# Patient Record
Sex: Male | Born: 1940 | Race: White | Hispanic: No | Marital: Married | Smoking: Former smoker
Health system: Southern US, Community
[De-identification: ages and names within clinical notes are randomized; demographics above are authoritative.]

## PROBLEM LIST (undated history)

## (undated) DIAGNOSIS — I255 Ischemic cardiomyopathy: Secondary | ICD-10-CM

## (undated) DIAGNOSIS — K219 Gastro-esophageal reflux disease without esophagitis: Secondary | ICD-10-CM

## (undated) DIAGNOSIS — I469 Cardiac arrest, cause unspecified: Secondary | ICD-10-CM

## (undated) DIAGNOSIS — I714 Abdominal aortic aneurysm, without rupture: Secondary | ICD-10-CM

## (undated) DIAGNOSIS — I739 Peripheral vascular disease, unspecified: Secondary | ICD-10-CM

## (undated) DIAGNOSIS — I471 Supraventricular tachycardia: Secondary | ICD-10-CM

## (undated) DIAGNOSIS — I472 Ventricular tachycardia, unspecified: Secondary | ICD-10-CM

## (undated) DIAGNOSIS — I5022 Chronic systolic (congestive) heart failure: Secondary | ICD-10-CM

## (undated) DIAGNOSIS — I4819 Other persistent atrial fibrillation: Secondary | ICD-10-CM

## (undated) DIAGNOSIS — N62 Hypertrophy of breast: Secondary | ICD-10-CM

## (undated) DIAGNOSIS — D649 Anemia, unspecified: Secondary | ICD-10-CM

## (undated) DIAGNOSIS — I4892 Unspecified atrial flutter: Secondary | ICD-10-CM

## (undated) DIAGNOSIS — I10 Essential (primary) hypertension: Secondary | ICD-10-CM

## (undated) DIAGNOSIS — I2510 Atherosclerotic heart disease of native coronary artery without angina pectoris: Secondary | ICD-10-CM

## (undated) DIAGNOSIS — I253 Aneurysm of heart: Secondary | ICD-10-CM

## (undated) DIAGNOSIS — K4021 Bilateral inguinal hernia, without obstruction or gangrene, recurrent: Secondary | ICD-10-CM

## (undated) DIAGNOSIS — I219 Acute myocardial infarction, unspecified: Secondary | ICD-10-CM

## (undated) DIAGNOSIS — E0590 Thyrotoxicosis, unspecified without thyrotoxic crisis or storm: Secondary | ICD-10-CM

## (undated) DIAGNOSIS — M1990 Unspecified osteoarthritis, unspecified site: Secondary | ICD-10-CM

## (undated) DIAGNOSIS — I447 Left bundle-branch block, unspecified: Secondary | ICD-10-CM

## (undated) DIAGNOSIS — I319 Disease of pericardium, unspecified: Secondary | ICD-10-CM

## (undated) HISTORY — DX: Left bundle-branch block, unspecified: I44.7

## (undated) HISTORY — PX: ENDOVASCULAR STENT INSERTION: SHX5161

## (undated) HISTORY — DX: Ischemic cardiomyopathy: I25.5

## (undated) HISTORY — DX: Chronic systolic (congestive) heart failure: I50.22

## (undated) HISTORY — DX: Ventricular tachycardia, unspecified: I47.20

## (undated) HISTORY — DX: Abdominal aortic aneurysm, without rupture: I71.4

## (undated) HISTORY — PX: TONSILLECTOMY: SUR1361

## (undated) HISTORY — DX: Peripheral vascular disease, unspecified: I73.9

## (undated) HISTORY — PX: VENTRICULAR ABLATION SURGERY: SHX835

## (undated) HISTORY — PX: PACEMAKER INSERTION: SHX728

## (undated) HISTORY — DX: Ventricular tachycardia: I47.2

## (undated) HISTORY — DX: Thyrotoxicosis, unspecified without thyrotoxic crisis or storm: E05.90

## (undated) HISTORY — PX: CARDIAC CATHETERIZATION: SHX172

## (undated) HISTORY — DX: Hypertrophy of breast: N62

## (undated) HISTORY — DX: Acute myocardial infarction, unspecified: I21.9

## (undated) HISTORY — DX: Atherosclerotic heart disease of native coronary artery without angina pectoris: I25.10

---

## 1997-05-31 DIAGNOSIS — I2510 Atherosclerotic heart disease of native coronary artery without angina pectoris: Secondary | ICD-10-CM

## 1997-05-31 HISTORY — DX: Atherosclerotic heart disease of native coronary artery without angina pectoris: I25.10

## 1997-08-29 ENCOUNTER — Encounter (HOSPITAL_COMMUNITY): Admission: RE | Admit: 1997-08-29 | Discharge: 1997-11-27 | Payer: Self-pay | Admitting: Cardiology

## 1997-11-28 ENCOUNTER — Encounter (HOSPITAL_COMMUNITY): Admission: RE | Admit: 1997-11-28 | Discharge: 1998-02-26 | Payer: Self-pay | Admitting: Cardiology

## 1998-02-27 ENCOUNTER — Encounter (HOSPITAL_COMMUNITY): Admission: RE | Admit: 1998-02-27 | Discharge: 1998-05-28 | Payer: Self-pay | Admitting: Cardiology

## 1998-05-29 ENCOUNTER — Encounter (HOSPITAL_COMMUNITY): Admission: RE | Admit: 1998-05-29 | Discharge: 1998-08-27 | Payer: Self-pay | Admitting: Cardiology

## 1998-08-28 ENCOUNTER — Encounter (HOSPITAL_COMMUNITY): Admission: RE | Admit: 1998-08-28 | Discharge: 1998-11-26 | Payer: Self-pay | Admitting: Cardiology

## 1998-11-27 ENCOUNTER — Encounter (HOSPITAL_COMMUNITY): Admission: RE | Admit: 1998-11-27 | Discharge: 1999-02-25 | Payer: Self-pay | Admitting: Cardiology

## 2003-07-09 ENCOUNTER — Inpatient Hospital Stay (HOSPITAL_COMMUNITY): Admission: RE | Admit: 2003-07-09 | Discharge: 2003-07-17 | Payer: Self-pay | Admitting: Cardiology

## 2003-07-10 ENCOUNTER — Encounter: Payer: Self-pay | Admitting: Cardiology

## 2004-01-13 ENCOUNTER — Emergency Department (HOSPITAL_COMMUNITY): Admission: EM | Admit: 2004-01-13 | Discharge: 2004-01-14 | Payer: Self-pay | Admitting: Emergency Medicine

## 2004-06-25 ENCOUNTER — Ambulatory Visit: Payer: Self-pay

## 2004-11-10 ENCOUNTER — Ambulatory Visit: Payer: Self-pay | Admitting: Internal Medicine

## 2005-02-22 ENCOUNTER — Ambulatory Visit: Payer: Self-pay

## 2005-06-18 ENCOUNTER — Ambulatory Visit: Payer: Self-pay | Admitting: Internal Medicine

## 2005-10-21 ENCOUNTER — Ambulatory Visit: Payer: Self-pay | Admitting: Internal Medicine

## 2006-02-08 ENCOUNTER — Ambulatory Visit: Payer: Self-pay | Admitting: Internal Medicine

## 2006-06-13 ENCOUNTER — Inpatient Hospital Stay (HOSPITAL_COMMUNITY): Admission: EM | Admit: 2006-06-13 | Discharge: 2006-06-14 | Payer: Self-pay | Admitting: Emergency Medicine

## 2006-06-13 ENCOUNTER — Ambulatory Visit: Payer: Self-pay | Admitting: Internal Medicine

## 2006-06-23 ENCOUNTER — Ambulatory Visit: Payer: Self-pay | Admitting: Internal Medicine

## 2006-07-13 ENCOUNTER — Ambulatory Visit: Payer: Self-pay | Admitting: Internal Medicine

## 2006-07-13 ENCOUNTER — Ambulatory Visit: Payer: Self-pay

## 2006-08-11 ENCOUNTER — Ambulatory Visit: Payer: Self-pay

## 2006-08-11 ENCOUNTER — Ambulatory Visit: Payer: Self-pay | Admitting: Internal Medicine

## 2006-12-19 ENCOUNTER — Ambulatory Visit: Payer: Self-pay | Admitting: Internal Medicine

## 2007-02-02 ENCOUNTER — Ambulatory Visit: Payer: Self-pay

## 2007-03-28 ENCOUNTER — Ambulatory Visit: Payer: Self-pay | Admitting: Internal Medicine

## 2007-04-06 ENCOUNTER — Ambulatory Visit: Payer: Self-pay | Admitting: *Deleted

## 2007-06-01 DIAGNOSIS — I714 Abdominal aortic aneurysm, without rupture, unspecified: Secondary | ICD-10-CM

## 2007-06-01 HISTORY — DX: Abdominal aortic aneurysm, without rupture: I71.4

## 2007-06-01 HISTORY — DX: Abdominal aortic aneurysm, without rupture, unspecified: I71.40

## 2007-06-15 ENCOUNTER — Ambulatory Visit: Payer: Self-pay

## 2007-09-21 ENCOUNTER — Ambulatory Visit: Payer: Self-pay

## 2007-10-05 ENCOUNTER — Ambulatory Visit: Payer: Self-pay | Admitting: *Deleted

## 2007-10-24 ENCOUNTER — Encounter: Admission: RE | Admit: 2007-10-24 | Discharge: 2007-10-24 | Payer: Self-pay | Admitting: *Deleted

## 2007-10-24 ENCOUNTER — Ambulatory Visit: Payer: Self-pay | Admitting: Internal Medicine

## 2007-10-26 ENCOUNTER — Ambulatory Visit: Payer: Self-pay | Admitting: *Deleted

## 2007-11-24 ENCOUNTER — Inpatient Hospital Stay (HOSPITAL_COMMUNITY): Admission: RE | Admit: 2007-11-24 | Discharge: 2007-11-26 | Payer: Self-pay | Admitting: *Deleted

## 2007-11-24 ENCOUNTER — Ambulatory Visit: Payer: Self-pay | Admitting: *Deleted

## 2007-12-14 ENCOUNTER — Ambulatory Visit: Payer: Self-pay | Admitting: Internal Medicine

## 2007-12-21 ENCOUNTER — Ambulatory Visit: Payer: Self-pay | Admitting: Internal Medicine

## 2007-12-21 ENCOUNTER — Inpatient Hospital Stay (HOSPITAL_COMMUNITY): Admission: EM | Admit: 2007-12-21 | Discharge: 2007-12-24 | Payer: Self-pay | Admitting: Emergency Medicine

## 2007-12-29 ENCOUNTER — Ambulatory Visit: Payer: Self-pay | Admitting: Internal Medicine

## 2008-01-04 ENCOUNTER — Ambulatory Visit: Payer: Self-pay | Admitting: *Deleted

## 2008-01-29 ENCOUNTER — Ambulatory Visit: Payer: Self-pay | Admitting: Internal Medicine

## 2008-02-06 ENCOUNTER — Ambulatory Visit: Payer: Self-pay | Admitting: Internal Medicine

## 2008-02-27 ENCOUNTER — Ambulatory Visit: Payer: Self-pay | Admitting: Internal Medicine

## 2008-03-08 ENCOUNTER — Ambulatory Visit: Payer: Self-pay | Admitting: Internal Medicine

## 2008-05-17 ENCOUNTER — Ambulatory Visit: Payer: Self-pay | Admitting: Internal Medicine

## 2008-07-02 ENCOUNTER — Encounter: Payer: Self-pay | Admitting: Internal Medicine

## 2008-07-04 ENCOUNTER — Ambulatory Visit: Payer: Self-pay | Admitting: *Deleted

## 2008-07-18 ENCOUNTER — Ambulatory Visit: Payer: Self-pay

## 2008-09-12 ENCOUNTER — Ambulatory Visit: Payer: Self-pay

## 2008-09-12 ENCOUNTER — Encounter: Payer: Self-pay | Admitting: Internal Medicine

## 2008-10-09 ENCOUNTER — Encounter (INDEPENDENT_AMBULATORY_CARE_PROVIDER_SITE_OTHER): Payer: Self-pay | Admitting: *Deleted

## 2008-10-17 ENCOUNTER — Encounter: Payer: Self-pay | Admitting: Internal Medicine

## 2008-11-27 ENCOUNTER — Encounter: Payer: Self-pay | Admitting: Internal Medicine

## 2008-12-09 ENCOUNTER — Encounter: Payer: Self-pay | Admitting: Internal Medicine

## 2008-12-09 ENCOUNTER — Ambulatory Visit: Payer: Self-pay

## 2009-01-02 ENCOUNTER — Ambulatory Visit: Payer: Self-pay | Admitting: *Deleted

## 2009-02-07 DIAGNOSIS — I714 Abdominal aortic aneurysm, without rupture, unspecified: Secondary | ICD-10-CM | POA: Insufficient documentation

## 2009-02-07 DIAGNOSIS — E0590 Thyrotoxicosis, unspecified without thyrotoxic crisis or storm: Secondary | ICD-10-CM | POA: Insufficient documentation

## 2009-02-11 ENCOUNTER — Ambulatory Visit: Payer: Self-pay | Admitting: Internal Medicine

## 2009-02-17 ENCOUNTER — Encounter: Payer: Self-pay | Admitting: Internal Medicine

## 2009-04-07 ENCOUNTER — Ambulatory Visit: Payer: Self-pay

## 2009-04-07 ENCOUNTER — Encounter: Payer: Self-pay | Admitting: Internal Medicine

## 2009-04-18 ENCOUNTER — Encounter: Payer: Self-pay | Admitting: Internal Medicine

## 2009-05-19 ENCOUNTER — Ambulatory Visit: Payer: Self-pay | Admitting: Internal Medicine

## 2009-05-19 ENCOUNTER — Telehealth (INDEPENDENT_AMBULATORY_CARE_PROVIDER_SITE_OTHER): Payer: Self-pay | Admitting: *Deleted

## 2009-05-19 ENCOUNTER — Inpatient Hospital Stay (HOSPITAL_COMMUNITY): Admission: EM | Admit: 2009-05-19 | Discharge: 2009-05-22 | Payer: Self-pay | Admitting: Emergency Medicine

## 2009-05-19 ENCOUNTER — Encounter: Payer: Self-pay | Admitting: Internal Medicine

## 2009-06-02 ENCOUNTER — Encounter: Payer: Self-pay | Admitting: Internal Medicine

## 2009-06-09 ENCOUNTER — Ambulatory Visit: Payer: Self-pay

## 2009-06-09 ENCOUNTER — Encounter: Payer: Self-pay | Admitting: Internal Medicine

## 2009-06-24 ENCOUNTER — Telehealth: Payer: Self-pay | Admitting: Internal Medicine

## 2009-07-10 ENCOUNTER — Ambulatory Visit: Payer: Self-pay | Admitting: Vascular Surgery

## 2009-07-28 ENCOUNTER — Encounter: Payer: Self-pay | Admitting: Internal Medicine

## 2009-08-19 ENCOUNTER — Ambulatory Visit: Payer: Self-pay | Admitting: Internal Medicine

## 2009-09-18 ENCOUNTER — Ambulatory Visit: Payer: Self-pay

## 2009-09-18 ENCOUNTER — Encounter: Payer: Self-pay | Admitting: Internal Medicine

## 2009-10-22 ENCOUNTER — Ambulatory Visit: Payer: Self-pay

## 2010-01-05 ENCOUNTER — Ambulatory Visit: Payer: Self-pay | Admitting: Cardiology

## 2010-01-26 ENCOUNTER — Ambulatory Visit: Payer: Self-pay

## 2010-01-26 ENCOUNTER — Encounter: Payer: Self-pay | Admitting: Internal Medicine

## 2010-02-04 ENCOUNTER — Ambulatory Visit: Payer: Self-pay | Admitting: Cardiology

## 2010-02-16 ENCOUNTER — Ambulatory Visit: Payer: Self-pay | Admitting: Surgery

## 2010-03-10 ENCOUNTER — Ambulatory Visit: Payer: Self-pay | Admitting: Cardiology

## 2010-03-19 ENCOUNTER — Ambulatory Visit: Payer: Self-pay

## 2010-03-19 ENCOUNTER — Encounter: Payer: Self-pay | Admitting: Internal Medicine

## 2010-04-06 ENCOUNTER — Ambulatory Visit: Payer: Self-pay | Admitting: Cardiology

## 2010-05-04 ENCOUNTER — Ambulatory Visit: Payer: Self-pay | Admitting: Cardiology

## 2010-05-21 ENCOUNTER — Encounter: Payer: Self-pay | Admitting: Internal Medicine

## 2010-05-21 ENCOUNTER — Ambulatory Visit: Payer: Self-pay | Admitting: Internal Medicine

## 2010-05-21 DIAGNOSIS — I447 Left bundle-branch block, unspecified: Secondary | ICD-10-CM | POA: Insufficient documentation

## 2010-06-05 ENCOUNTER — Ambulatory Visit: Payer: Self-pay | Admitting: Cardiovascular Disease

## 2010-06-22 ENCOUNTER — Encounter: Payer: Self-pay | Admitting: *Deleted

## 2010-06-25 ENCOUNTER — Ambulatory Visit: Admission: RE | Admit: 2010-06-25 | Discharge: 2010-06-25 | Payer: Self-pay | Source: Home / Self Care

## 2010-06-25 ENCOUNTER — Encounter: Payer: Self-pay | Admitting: Cardiology

## 2010-06-25 DIAGNOSIS — I255 Ischemic cardiomyopathy: Secondary | ICD-10-CM | POA: Insufficient documentation

## 2010-06-25 DIAGNOSIS — I472 Ventricular tachycardia: Secondary | ICD-10-CM | POA: Insufficient documentation

## 2010-06-25 DIAGNOSIS — I5022 Chronic systolic (congestive) heart failure: Secondary | ICD-10-CM | POA: Insufficient documentation

## 2010-06-30 NOTE — Procedures (Signed)
Summary: device check /saf  Medications Added ALPRAZOLAM 0.25 MG TABS (ALPRAZOLAM) Take 1 tablet by mouth four times a day MEXILETINE HCL 150 MG CAPS (MEXILETINE HCL) Take 1 capsule by mouth three times a day CRESTOR 5 MG TABS (ROSUVASTATIN CALCIUM) Take one capsule by mouth every other day LOVAZA 1 GM CAPS (OMEGA-3-ACID ETHYL ESTERS) Take 1 capsule by mouth two times a day      Allergies Added:   Current Medications (verified): 1)  Alprazolam 0.25 Mg Tabs (Alprazolam) .... Take 1 Tablet By Mouth Four Times A Day 2)  Carvedilol 12.5 Mg Tabs (Carvedilol) .... Take One Tablet By Mouth Twice A Day 3)  Mexiletine Hcl 150 Mg Caps (Mexiletine Hcl) .... Take 1 Capsule By Mouth Three Times A Day 4)  Hyzaar 100-25 Mg Tabs (Losartan Potassium-Hctz) .... By Mouth Daily 5)  Klor-Con M20 20 Meq Cr-Tabs (Potassium Chloride Crys Cr) .... Three Times A Day 6)  Plavix 75 Mg Tabs (Clopidogrel Bisulfate) .... Take One Tablet By Mouth Daily 7)  Isosorbide Mononitrate Cr 30 Mg Xr24h-Tab (Isosorbide Mononitrate) .... Take One Tablet By Mouth Daily 8)  Warfarin Sodium 1 Mg Tabs (Warfarin Sodium) .... Use As Directed By Anticoagualtion Clinic 9)  Crestor 5 Mg Tabs (Rosuvastatin Calcium) .... Take One Capsule By Mouth Every Other Day 10)  Synthroid 88 Mcg Tabs (Levothyroxine Sodium) .... By Mouth One Daily 11)  Sotalol Hcl 120 Mg Tabs (Sotalol Hcl) .... Take One and One Half Tablet By Mouth Twice A Day 12)  Af-Loratadine 10 Mg Tabs (Loratadine) .... One By Mouth Daily As Needed 13)  Docusate Sodium 100 Mg Caps (Docusate Sodium) .... By Mouth As Needed 14)  Aspirin 81 Mg Tbec (Aspirin) .... Take One Tablet By Mouth Daily 15)  Multivitamins   Tabs (Multiple Vitamin) .... One By Mouth Daily 16)  Vitamin C 500 Mg  Tabs (Ascorbic Acid) .... One By Mouth Daily 17)  Amlodipine Besylate 5 Mg Tabs (Amlodipine Besylate) .... One By Mouth Daily 18)  Lovaza 1 Gm Caps (Omega-3-Acid Ethyl Esters) .... Take 1 Capsule By Mouth  Two Times A Day  Allergies (verified): 1)  * Ivp Dye    ICD Specifications Following MD:  Sherryl Manges, MD     Referring MD:  [facility] ICD Vendor:  [employer] Jude     ICD Model Number:  [phone]     ICD Serial Number:  [id number] ICD DOI:  05/22/2003     ICD Implanting MD:  Sherryl Manges, MD  Lead 1:    Location: RA     DOI: 05/22/2003     Model #: 1688TC     Serial #: [id number]     Status: active Lead 2:    Location: RV     DOI: 05/22/2003     Model #: 1580     Serial #: [id number]     Status: active  Indications::  SMVT   ICD Follow Up Remote Check?  No Battery Voltage:  2.55 V     Charge Time:  13.6 seconds     ICD Dependent:  No       ICD Device Measurements Atrium:  Impedance: 540 ohms,  Right Ventricle:  Impedance: 375 ohms,  Shock Impedance: 38 ohms   Episodes MS Episodes:  0     Coumadin:  Yes Shock:  0     ATP:  0     Nonsustained:  0     Atrial Pacing:  99%     Ventricular Pacing:  55%  Brady Parameters Mode DDDR     Lower Rate Limit:  60     Upper Rate Limit 95 PAV 350     Sensed AV Delay:  300  Tachy Zones VF:  222     VT:  171     VT1:  115     Tech Comments:  Interrogation only, pt is post hospital for VT storm.  No episodes since restarting on Mexilitine at higher dose.  Plan ROV with Dr Graciela Husbands in March as scheduled. Merlin transmitter ordered today for patient. Gypsy Balsam RN BSN  June 09, 2009 1:24 PM

## 2010-06-30 NOTE — Cardiovascular Report (Signed)
Summary: Office Visit   Office Visit   Imported By: Roderic Ovens 02/10/2010 12:43:20  _____________________________________________________________________  External Attachment:    Type:   Image     Comment:   External Document

## 2010-06-30 NOTE — Letter (Signed)
Summary: [facility]   Imported By: Kassie Mends 07/18/2009 08:09:13  _____________________________________________________________________  External Attachment:    Type:   Image     Comment:   External Document

## 2010-06-30 NOTE — Letter (Signed)
Summary: [facility] Office Note  [facility] Office Note   Imported By: Roderic Ovens 08/21/2009 14:45:06  _____________________________________________________________________  External Attachment:    Type:   Image     Comment:   External Document

## 2010-06-30 NOTE — Progress Notes (Signed)
Summary: req call back   Phone Note Call from Patient Call back at Home Phone [phone]   Caller: Patient Reason for Call: Talk to Nurse Summary of Call: request to speak with Amber Initial call taken by: Migdalia Dk,  June 24, 2009 4:15 PM  Follow-up for Phone Call        Spoke with patient, having trouble transmitting on Merlin box, will come by office today. Gypsy Balsam RN BSN  June 25, 2009 9:17 AM

## 2010-06-30 NOTE — Procedures (Signed)
Summary: rov/jml      Allergies Added:   Current Medications (verified): 1)  Alprazolam 0.25 Mg Tabs (Alprazolam) .... Take 1 Tablet By Mouth Four Times A Day 2)  Carvedilol 12.5 Mg Tabs (Carvedilol) .... Take One Tablet By Mouth Twice A Day 3)  Mexiletine Hcl 150 Mg Caps (Mexiletine Hcl) .... Take 1 Capsule By Mouth Three Times A Day 4)  Hyzaar 100-25 Mg Tabs (Losartan Potassium-Hctz) .... By Mouth Daily 5)  Klor-Con M20 20 Meq Cr-Tabs (Potassium Chloride Crys Cr) .... Three Times A Day 6)  Plavix 75 Mg Tabs (Clopidogrel Bisulfate) .... Take One Tablet By Mouth Daily 7)  Isosorbide Mononitrate Cr 30 Mg Xr24h-Tab (Isosorbide Mononitrate) .... Take One Tablet By Mouth Daily 8)  Warfarin Sodium 1 Mg Tabs (Warfarin Sodium) .... Use As Directed By Anticoagualtion Clinic 9)  Crestor 5 Mg Tabs (Rosuvastatin Calcium) .... Take One Capsule By Mouth Every Other Day 10)  Synthroid 88 Mcg Tabs (Levothyroxine Sodium) .... By Mouth One Daily 11)  Sotalol Hcl 120 Mg Tabs (Sotalol Hcl) .... Take One and One Half Tablet By Mouth Twice A Day 12)  Af-Loratadine 10 Mg Tabs (Loratadine) .... One By Mouth Daily As Needed 13)  Docusate Sodium 100 Mg Caps (Docusate Sodium) .... By Mouth As Needed 14)  Aspirin 81 Mg Tbec (Aspirin) .... Take One Tablet By Mouth Daily 15)  Multivitamins   Tabs (Multiple Vitamin) .... One By Mouth Daily 16)  Vitamin C 500 Mg  Tabs (Ascorbic Acid) .... One By Mouth Daily 17)  Amlodipine Besylate 5 Mg Tabs (Amlodipine Besylate) .... One By Mouth Daily 18)  Lovaza 1 Gm Caps (Omega-3-Acid Ethyl Esters) .... Take 1 Capsule By Mouth Two Times A Day  Allergies (verified): 1)  ! Prednisone 2)  * Ivp Dye    ICD Specifications Following MD:  Sherryl Manges, MD     Referring MD:  [facility] ICD Vendor:  St Jude     ICD Model Number:  [id number]     ICD Serial Number:  [id number] ICD DOI:  05/22/2003     ICD Implanting MD:  Sherryl Manges, MD  Lead 1:    Location: RA     DOI: 05/22/2003     Model #:  1688TC     Serial #: [id number]     Status: active Lead 2:    Location: RV     DOI: 05/22/2003     Model #: 1580     Serial #: [id number]     Status: active  Indications::  SMVT   ICD Follow Up Remote Check?  No Battery Voltage:  2.55 V     Charge Time:  13.9 seconds     Underlying rhythm:  SB ICD Dependent:  No       ICD Device Measurements Atrium:  Impedance: 550 ohms,  Right Ventricle:  Impedance: 375 ohms,  Shock Impedance: 38 ohms   Episodes MS Episodes:  0     Coumadin:  No Shock:  0     ATP:  0     Nonsustained:  0     Atrial Pacing:  95%     Ventricular Pacing:  64%  Brady Parameters Mode DDDR     Lower Rate Limit:  60     Upper Rate Limit 95 PAV 350     Sensed AV Delay:  300  Tachy Zones VF:  222     VT:  171     VT1:  115  Tech Comments:  Interrogation only for battery.  Pt has been at 2.55V since 11-09.  ROV 8 weeks clinic to reassess. Gypsy Balsam RN BSN  Oct 22, 2009 10:16 AM

## 2010-06-30 NOTE — Assessment & Plan Note (Signed)
Summary: device/saf      Allergies Added:   Referring Provider:  Ronny Flurry  CC:  device check.  History of Present Illness: 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 This occurs in the setting of ischemic heart disease prior revascularization and mild to moderate depression of LV systolic function.  He has had no intercurrent ICD discharges. Denies chest pain.  He feels better than he has in a long time. He states it's related to take his mexiletine 3 times a day. His energy level is better. There has been no further shortness of breath or edema apart from the normal.  Current Medications (verified): 1)  Alprazolam 0.25 Mg Tabs (Alprazolam) .... Take 1 Tablet By Mouth Four Times A Day 2)  Carvedilol 12.5 Mg Tabs (Carvedilol) .... Take One Tablet By Mouth Twice A Day 3)  Mexiletine Hcl 150 Mg Caps (Mexiletine Hcl) .... Take 1 Capsule By Mouth Three Times A Day 4)  Hyzaar 100-25 Mg Tabs (Losartan Potassium-Hctz) .... By Mouth Daily 5)  Klor-Con M20 20 Meq Cr-Tabs (Potassium Chloride Crys Cr) .... Three Times A Day 6)  Plavix 75 Mg Tabs (Clopidogrel Bisulfate) .... Take One Tablet By Mouth Daily 7)  Isosorbide Mononitrate Cr 30 Mg Xr24h-Tab (Isosorbide Mononitrate) .... Take One Tablet By Mouth Daily 8)  Warfarin Sodium 1 Mg Tabs (Warfarin Sodium) .... Use As Directed By Anticoagualtion Clinic 9)  Crestor 5 Mg Tabs (Rosuvastatin Calcium) .... Take One Capsule By Mouth Every Other Day 10)  Synthroid 88 Mcg Tabs (Levothyroxine Sodium) .... By Mouth One Daily 11)  Sotalol Hcl 120 Mg Tabs (Sotalol Hcl) .... Take One and One Half Tablet By Mouth Twice A Day 12)  Af-Loratadine 10 Mg Tabs (Loratadine) .... One By Mouth Daily As Needed 13)  Docusate Sodium 100 Mg Caps (Docusate Sodium) .... By Mouth As Needed 14)  Aspirin 81 Mg Tbec (Aspirin) .... Take One Tablet By Mouth Daily 15)  Multivitamins   Tabs (Multiple Vitamin) .... One By Mouth  Daily 16)  Vitamin C 500 Mg  Tabs (Ascorbic Acid) .... One By Mouth Daily 17)  Amlodipine Besylate 5 Mg Tabs (Amlodipine Besylate) .... One By Mouth Daily 18)  Lovaza 1 Gm Caps (Omega-3-Acid Ethyl Esters) .... Take 1 Capsule By Mouth Two Times A Day  Allergies (verified): 1)  * Ivp Dye  Past History:  Past Medical History: Last updated: 08/18/2009 THYROTOXICOSIS (ICD-242.90) ABDOMINAL AORTIC ANEURYSM (ICD-441.4) AUTOMATIC IMPLANTABLE CARDIAC DEFIBRILLATOR SITU (ICD-V45.02)- [employer] Atlas V-242 MYOCARDIAL INFARCTION (ICD-410.90) CAD (ICD-414.00) ISCHEMIC HEART DISEASE (ICD-414.9) VENTRICULAR TACHYCARDIA (ICD-427.1) TACHYCARDIA (ICD-785.0)  Past Surgical History: Last updated: 08/18/2009 Endovascular aneurysm repair  Cardiac Cath X 2 [employer] Atlas V-242  Vital Signs:  Patient profile:   70 year old male Height:      70 inches Weight:      181 pounds BMI:     26.06 Pulse rate:   60 / minute Pulse rhythm:   regular BP sitting:   120 / 74  (left arm) Cuff size:   regular  Vitals Entered By: Judithe Modest CMA (August 19, 2009 9:29 AM)  Physical Exam  General:  The patient was alert and oriented in no acute distress. HEENT Normal.  Neck veins were flat, carotids were brisk.  Lungs were clear.  Heart sounds were regular without murmurs or gallops.  Abdomen was soft with active bowel sounds. There is no clubbing cyanosis or edema. Skin Warm and dry  ICD Specifications Following MD:  Sherryl Manges, MD     Referring MD:  [facility] ICD Vendor:  St Jude     ICD Model Number:  [phone]     ICD Serial Number:  [id number] ICD DOI:  05/22/2003     ICD Implanting MD:  Sherryl Manges, MD  Lead 1:    Location: RA     DOI: 05/22/2003     Model #: 1688TC     Serial #: [id number]     Status: active Lead 2:    Location: RV     DOI: 05/22/2003     Model #: 1580     Serial #: [id number]     Status: active  Indications::  SMVT   ICD Follow Up Remote Check?  No Battery Voltage:  2.55 V      Charge Time:  13.9 seconds     Underlying rhythm:  Brady @ 55 ICD Dependent:  No       ICD Device Measurements Atrium:  Amplitude: 3.0 mV, Impedance: 590 ohms, Threshold: 1.0 V at 0.5 msec Right Ventricle:  Amplitude: 12.0 mV, Impedance: 370 ohms, Threshold: 0.75 V at 0.6 msec  Episodes MS Episodes:  0     Percent Mode Switch:  0     Coumadin:  No Shock:  0     ATP:  0     Nonsustained:  0     Atrial Pacing:  96%     Ventricular Pacing:  58%  Brady Parameters Mode DDDR     Lower Rate Limit:  60     Upper Rate Limit 95 PAV 350     Sensed AV Delay:  300  Tachy Zones VF:  222     VT:  171     VT1:  115     Tech Comments:  No parameter changes.  Device function normal.  Battery voltage 2.55 since 11/09.  Checked by [employer].    Impression & Recommendations:  Problem # 1:  IMPLANTATION OF DEFIBRILLATOR,  [employer] ATLAS V-242 (ICD-V45.02) Device parameters and data were reviewed and no changes were made  we have had discussions with [employer] regarding his end of life behavior. Because we cannot predict life at this point a partial range of 0-16 months, we'll plan to see him every month her battery checks.  Problem # 2:  VENTRICULAR TACHYCARDIA (ICD-427.1) no intercurrent ventricular tachycardia His updated medication list for this problem includes:    Carvedilol 12.5 Mg Tabs (Carvedilol) .[name]... Take one tablet by mouth twice a day    Mexiletine Hcl 150 Mg Caps (Mexiletine hcl) .[name]... Take 1 capsule by mouth three times a day    Plavix 75 Mg Tabs (Clopidogrel bisulfate) .[name]... Take one tablet by mouth daily    Isosorbide Mononitrate Cr 30 Mg Xr24h-tab (Isosorbide mononitrate) .[name]... Take one tablet by mouth daily    Warfarin Sodium 1 Mg Tabs (Warfarin sodium) ..... Use as directed by anticoagualtion clinic    Sotalol Hcl 120 Mg Tabs (Sotalol hcl) .[name]... Take one and one half tablet by mouth twice a day    Aspirin 81 Mg Tbec (Aspirin) .[name]... Take one tablet by mouth daily    Amlodipine Besylate 5  Mg Tabs (Amlodipine besylate) ..... One by mouth daily  His updated medication list for this problem includes:    Carvedilol 12.5 Mg Tabs (Carvedilol) .[name]... Take one tablet by mouth twice a day    Mexiletine Hcl 150 Mg Caps (Mexiletine hcl) .Marland KitchenMarland KitchenMarland KitchenMarland Kitchen  Take 1 capsule by mouth three times a day    Plavix 75 Mg Tabs (Clopidogrel bisulfate) .[name]... Take one tablet by mouth daily    Isosorbide Mononitrate Cr 30 Mg Xr24h-tab (Isosorbide mononitrate) .[name]... Take one tablet by mouth daily    Warfarin Sodium 1 Mg Tabs (Warfarin sodium) ..... Use as directed by anticoagualtion clinic    Sotalol Hcl 120 Mg Tabs (Sotalol hcl) .[name]... Take one and one half tablet by mouth twice a day    Aspirin 81 Mg Tbec (Aspirin) .[name]... Take one tablet by mouth daily    Amlodipine Besylate 5 Mg Tabs (Amlodipine besylate) ..... One by mouth daily  Problem # 3:  ISCHEMIC HEART DISEASE (ICD-414.9) stanlre without symptoms  Patient Instructions: 1)  Your physician recommends that you schedule a follow-up appointment on September 18, 2009 at 10AM.  2)  Your physician recommends that you continue on your current medications as directed. Please refer to the Current Medication list given to you today.

## 2010-06-30 NOTE — Cardiovascular Report (Signed)
Summary: Office Visit   Office Visit   Imported By: Roderic Ovens 09/29/2009 15:32:17  _____________________________________________________________________  External Attachment:    Type:   Image     Comment:   External Document

## 2010-06-30 NOTE — Procedures (Signed)
Summary: [email or web address]      Allergies Added:   Current Medications (verified): 1)  Alprazolam 0.25 Mg Tabs (Alprazolam) .... Take 1 Tablet By Mouth Four Times A Day 2)  Carvedilol 12.5 Mg Tabs (Carvedilol) .... Take One Tablet By Mouth Twice A Day 3)  Mexiletine Hcl 150 Mg Caps (Mexiletine Hcl) .... Take 1 Capsule By Mouth Three Times A Day 4)  Hyzaar 100-25 Mg Tabs (Losartan Potassium-Hctz) .... By Mouth Daily 5)  Klor-Con M20 20 Meq Cr-Tabs (Potassium Chloride Crys Cr) .... Three Times A Day 6)  Plavix 75 Mg Tabs (Clopidogrel Bisulfate) .... Take One Tablet By Mouth Daily 7)  Isosorbide Mononitrate Cr 30 Mg Xr24h-Tab (Isosorbide Mononitrate) .... Take One Tablet By Mouth Daily 8)  Warfarin Sodium 1 Mg Tabs (Warfarin Sodium) .... Use As Directed By Anticoagualtion Clinic 9)  Crestor 5 Mg Tabs (Rosuvastatin Calcium) .... Take One Capsule By Mouth Every Other Day 10)  Synthroid 88 Mcg Tabs (Levothyroxine Sodium) .... By Mouth One Daily 11)  Sotalol Hcl 120 Mg Tabs (Sotalol Hcl) .... Take One and One Half Tablet By Mouth Twice A Day 12)  Af-Loratadine 10 Mg Tabs (Loratadine) .... One By Mouth Daily As Needed 13)  Docusate Sodium 100 Mg Caps (Docusate Sodium) .... By Mouth As Needed 14)  Aspirin 81 Mg Tbec (Aspirin) .... Take One Tablet By Mouth Daily 15)  Multivitamins   Tabs (Multiple Vitamin) .... One By Mouth Daily 16)  Vitamin C 500 Mg  Tabs (Ascorbic Acid) .... One By Mouth Daily 17)  Amlodipine Besylate 5 Mg Tabs (Amlodipine Besylate) .... One By Mouth Daily 18)  Lovaza 1 Gm Caps (Omega-3-Acid Ethyl Esters) .... Take 1 Capsule By Mouth Two Times A Day  Allergies (verified): 1)  ! Prednisone 2)  * Ivp Dye   ICD Specifications Following MD:  Sherryl Manges, MD     Referring MD:  [facility] ICD Vendor:  [employer] Jude     ICD Model Number:  [phone]     ICD Serial Number:  [id number] ICD DOI:  05/22/2003     ICD Implanting MD:  Sherryl Manges, MD  Lead 1:    Location: RA     DOI: 05/22/2003      Model #: 1688TC     Serial #: [id number]     Status: active Lead 2:    Location: RV     DOI: 05/22/2003     Model #: 1580     Serial #: [id number]     Status: active  Indications::  SMVT   ICD Follow Up Battery Voltage:  2.54 V     Charge Time:  14.0 seconds     Underlying rhythm:  SR ICD Dependent:  No       ICD Device Measurements Atrium:  Amplitude: 3.0 mV, Impedance: 570 ohms, Threshold: 1.0 V at 0.5 msec Right Ventricle:  Amplitude: 12.0 mV, Impedance: 375 ohms, Threshold: 1.0 V at 0.6 msec Shock Impedance: 49 ohms   Episodes MS Episodes:  0     Coumadin:  No Shock:  0     ATP:  1     Nonsustained:  0     Atrial Therapies:  0 Atrial Pacing:  93%     Ventricular Pacing:  55%  Brady Parameters Mode DDDR     Lower Rate Limit:  60     Upper Rate Limit 95 PAV 350     Sensed AV Delay:  300  Tachy Zones VF:  222  VT:  171     VT1:  115     Next Cardiology Appt Due:  03/23/2010 Tech Comments:  BATTERY VOLTAGE 2.54 ---NEXT CHECK IN 8 WKS.  1 VT-1 EPISODE LASTING 12 SECONDS --SUCCESSFULLY PACE TERMINATED.  NORMAL DEVICE FUNCTION.  NO CHANGES MADE.  ROV IN OCT. Vella Kohler  January 26, 2010 11:53 AM

## 2010-06-30 NOTE — Cardiovascular Report (Signed)
Summary: Office Visit   Office Visit   Imported By: Roderic Ovens 03/30/2010 12:50:20  _____________________________________________________________________  External Attachment:    Type:   Image     Comment:   External Document

## 2010-06-30 NOTE — Procedures (Signed)
Summary: device check/sl      Allergies Added:   Current Medications (verified): 1)  Alprazolam 0.25 Mg Tabs (Alprazolam) .... Take 1 Tablet By Mouth Four Times A Day 2)  Carvedilol 12.5 Mg Tabs (Carvedilol) .... Take One Tablet By Mouth Twice A Day 3)  Mexiletine Hcl 150 Mg Caps (Mexiletine Hcl) .... Take 1 Capsule By Mouth Three Times A Day 4)  Hyzaar 100-25 Mg Tabs (Losartan Potassium-Hctz) .... By Mouth Daily 5)  Klor-Con M20 20 Meq Cr-Tabs (Potassium Chloride Crys Cr) .... Three Times A Day 6)  Plavix 75 Mg Tabs (Clopidogrel Bisulfate) .... Take One Tablet By Mouth Daily 7)  Isosorbide Mononitrate Cr 30 Mg Xr24h-Tab (Isosorbide Mononitrate) .... Take One Tablet By Mouth Daily 8)  Warfarin Sodium 1 Mg Tabs (Warfarin Sodium) .... Use As Directed By Anticoagualtion Clinic 9)  Crestor 5 Mg Tabs (Rosuvastatin Calcium) .... Take One Capsule By Mouth Every Other Day 10)  Synthroid 88 Mcg Tabs (Levothyroxine Sodium) .... By Mouth One Daily 11)  Sotalol Hcl 120 Mg Tabs (Sotalol Hcl) .... Take One and One Half Tablet By Mouth Twice A Day 12)  Af-Loratadine 10 Mg Tabs (Loratadine) .... One By Mouth Daily As Needed 13)  Docusate Sodium 100 Mg Caps (Docusate Sodium) .... By Mouth As Needed 14)  Aspirin 81 Mg Tbec (Aspirin) .... Take One Tablet By Mouth Daily 15)  Multivitamins   Tabs (Multiple Vitamin) .... One By Mouth Daily 16)  Vitamin C 500 Mg  Tabs (Ascorbic Acid) .... One By Mouth Daily 17)  Amlodipine Besylate 5 Mg Tabs (Amlodipine Besylate) .... One By Mouth Daily 18)  Lovaza 1 Gm Caps (Omega-3-Acid Ethyl Esters) .... Take 1 Capsule By Mouth Two Times A Day  Allergies (verified): 1)  ! Prednisone 2)  * Ivp Dye   ICD Specifications Following MD:  Sherryl Manges, MD     Referring MD:  [facility] ICD Vendor:  [employer]     ICD Model Number:  [phone]     ICD Serial Number:  [id number] ICD DOI:  05/22/2003     ICD Implanting MD:  Sherryl Manges, MD  Lead 1:    Location: RA     DOI: 05/22/2003      Model #: 1688TC     Serial #: [id number]     Status: active Lead 2:    Location: RV     DOI: 05/22/2003     Model #: 1580     Serial #: [id number]     Status: active  Indications::  SMVT   ICD Follow Up Remote Check?  No Battery Voltage:  2.51 V     Charge Time:  14.5 seconds     Underlying rhythm:  Huston Foley ICD Dependent:  No       ICD Device Measurements Atrium:  Amplitude: 3.0 mV, Impedance: 550 ohms, Threshold: 1.0 V at 0.5 msec Right Ventricle:  Amplitude: 12 mV, Impedance: 375 ohms, Threshold: 1.0 V at 0.6 msec  Episodes MS Episodes:  0     Percent Mode Switch:  0     Coumadin:  No Shock:  0     ATP:  0     Nonsustained:  0     Atrial Pacing:  92%     Ventricular Pacing:  49%  Brady Parameters Mode DDDR     Lower Rate Limit:  60     Upper Rate Limit 95 PAV 350     Sensed AV Delay:  300  Tachy  Zones VF:  222     VT:  171     VT1:  115     Next Cardiology Appt Due:  04/30/2010 Tech Comments:  No parameter changes. Device function normal.  Atlas battery @ 2.51 today, he reached 2.55 in November 2009.  ROV 2 months with Dr. Graciela Husbands. Altha Harm, LPN  March 19, 2010 9:06 AM

## 2010-06-30 NOTE — Cardiovascular Report (Signed)
Summary: Office Visit   Office Visit   Imported By: Roderic Ovens 06/12/2009 12:07:23  _____________________________________________________________________  External Attachment:    Type:   Image     Comment:   External Document

## 2010-06-30 NOTE — Procedures (Signed)
Summary: ICD CHECK.SJM.AMBER    Current Medications (verified): 1)  Alprazolam 0.25 Mg Tabs (Alprazolam) .... Take 1 Tablet By Mouth Four Times A Day 2)  Carvedilol 12.5 Mg Tabs (Carvedilol) .... Take One Tablet By Mouth Twice A Day 3)  Mexiletine Hcl 150 Mg Caps (Mexiletine Hcl) .... Take 1 Capsule By Mouth Three Times A Day 4)  Hyzaar 100-25 Mg Tabs (Losartan Potassium-Hctz) .... By Mouth Daily 5)  Klor-Con M20 20 Meq Cr-Tabs (Potassium Chloride Crys Cr) .... Three Times A Day 6)  Plavix 75 Mg Tabs (Clopidogrel Bisulfate) .... Take One Tablet By Mouth Daily 7)  Isosorbide Mononitrate Cr 30 Mg Xr24h-Tab (Isosorbide Mononitrate) .... Take One Tablet By Mouth Daily 8)  Warfarin Sodium 1 Mg Tabs (Warfarin Sodium) .... Use As Directed By Anticoagualtion Clinic 9)  Crestor 5 Mg Tabs (Rosuvastatin Calcium) .... Take One Capsule By Mouth Every Other Day 10)  Synthroid 88 Mcg Tabs (Levothyroxine Sodium) .... By Mouth One Daily 11)  Sotalol Hcl 120 Mg Tabs (Sotalol Hcl) .... Take One and One Half Tablet By Mouth Twice A Day 12)  Af-Loratadine 10 Mg Tabs (Loratadine) .... One By Mouth Daily As Needed 13)  Docusate Sodium 100 Mg Caps (Docusate Sodium) .... By Mouth As Needed 14)  Aspirin 81 Mg Tbec (Aspirin) .... Take One Tablet By Mouth Daily 15)  Multivitamins   Tabs (Multiple Vitamin) .... One By Mouth Daily 16)  Vitamin C 500 Mg  Tabs (Ascorbic Acid) .... One By Mouth Daily 17)  Amlodipine Besylate 5 Mg Tabs (Amlodipine Besylate) .... One By Mouth Daily 18)  Lovaza 1 Gm Caps (Omega-3-Acid Ethyl Esters) .... Take 1 Capsule By Mouth Two Times A Day  Allergies: 1)  ! Prednisone 2)  * Ivp Dye   ICD Specifications Following MD:  Sherryl Manges, MD     Referring MD:  [facility] ICD Vendor:  St Jude     ICD Model Number:  [phone]     ICD Serial Number:  [id number] ICD DOI:  05/22/2003     ICD Implanting MD:  Sherryl Manges, MD  Lead 1:    Location: RA     DOI: 05/22/2003     Model #: 1688TC     Serial #:  [id number]     Status: active Lead 2:    Location: RV     DOI: 05/22/2003     Model #: 1580     Serial #: [id number]     Status: active  Indications::  SMVT   ICD Follow Up Remote Check?  No Battery Voltage:  2.55 V     Charge Time:  13.9 seconds     Underlying rhythm:  Huston Foley ICD Dependent:  No      Episodes MS Episodes:  0     Percent Mode Switch:  0     Coumadin:  No Shock:  0     ATP:  0     Nonsustained:  0      Brady Parameters Mode DDDR     Lower Rate Limit:  60     Upper Rate Limit 95 PAV 350     Sensed AV Delay:  300  Tachy Zones VF:  222     VT:  171     VT1:  115     Tech Comments:  Battery check only today 2.55 since 11/09.  [name] will try his Merlin transmitter today and if he is able to transmit we will check him monthly  remotely otherwise we'll see him in the office. [name] Harm, LPN  September 18, 2009 10:28 AM

## 2010-06-30 NOTE — Cardiovascular Report (Signed)
Summary: Office Visit   Office Visit   Imported By: Roderic Ovens 06/12/2009 16:08:20  _____________________________________________________________________  External Attachment:    Type:   Image     Comment:   External Document

## 2010-07-01 HISTORY — PX: OTHER SURGICAL HISTORY: SHX169

## 2010-07-02 ENCOUNTER — Encounter (INDEPENDENT_AMBULATORY_CARE_PROVIDER_SITE_OTHER): Payer: Medicare Other

## 2010-07-02 DIAGNOSIS — Z7901 Long term (current) use of anticoagulants: Secondary | ICD-10-CM

## 2010-07-02 NOTE — Assessment & Plan Note (Signed)
Summary: device/saf      Allergies Added:   Visit Type:  ICD-St.Jude Referring Provider:  Ronny Flurry  CC:  dizziness.  History of Present Illness: [name] is seen in follow up for ventricular tachycardia with a prior history of storm. This occurs in the setting of ischemic heart disease prior revascularization and mild to moderate depression of LV systolic function.  He has had no intercurrent ICD discharges. Denies chest pain.  He feels better than he has in a long time. He states it's related to take his mexiletine 3 times a day. His energy level is better. He continues to have some degree of shortness of breath and exercise intolerance. There's been no peripheral edema  Problems Prior to Update: 1)  Sinus Bradycardia  (ICD-427.81) 2)  Implantation of Defibrillator, St. 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 Take 1 Tablet By Mouth Four Times A Day 2)  Carvedilol 12.5 Mg Tabs (Carvedilol) .... Take One Tablet By Mouth Twice A Day 3)  Mexiletine Hcl 150 Mg Caps (Mexiletine Hcl) .... Take 1 Capsule By Mouth Three Times A Day 4)  Hyzaar 100-25 Mg Tabs (Losartan Potassium-Hctz) .... By Mouth Daily 5)  Klor-Con M20 20 Meq Cr-Tabs (Potassium Chloride Crys Cr) .... Three Times A Day 6)  Plavix 75 Mg Tabs (Clopidogrel Bisulfate) .... Take One Tablet By Mouth Daily 7)  Isosorbide Mononitrate Cr 30 Mg Xr24h-Tab (Isosorbide Mononitrate) .... Take One Tablet By Mouth Daily 8)  Warfarin Sodium 1 Mg Tabs (Warfarin Sodium) .... Use As Directed By Anticoagualtion Clinic 9)  Crestor 5 Mg Tabs (Rosuvastatin Calcium) .... Take One Capsule By Mouth Every Other Day 10)  Synthroid 88 Mcg Tabs (Levothyroxine Sodium) .... By Mouth  One Daily 11)  Sotalol Hcl 120 Mg Tabs (Sotalol Hcl) .... Take One and One Half Tablet By Mouth Twice A Day 12)  Af-Loratadine 10 Mg Tabs (Loratadine) .... One By Mouth Daily As Needed 13)  Docusate Sodium 100 Mg Caps (Docusate Sodium) .... By Mouth As Needed 14)  Aspirin 81 Mg Tbec (Aspirin) .... Take One Tablet By Mouth Daily 15)  Multivitamins   Tabs (Multiple Vitamin) .... One By Mouth Daily 16)  Vitamin C 500 Mg  Tabs (Ascorbic Acid) .... One By Mouth Daily 17)  Amlodipine Besylate 5 Mg Tabs (Amlodipine Besylate) .... One By Mouth Daily 18)  Lovaza 1 Gm Caps (Omega-3-Acid Ethyl Esters) .... Take 1 Capsule By Mouth Two Times A Day  Allergies (verified): 1)  ! Prednisone 2)  * Ivp Dye  Past History:  Past Medical History: Last updated: 08/18/2009 THYROTOXICOSIS (ICD-242.90) ABDOMINAL AORTIC ANEURYSM (ICD-441.4) AUTOMATIC IMPLANTABLE CARDIAC DEFIBRILLATOR SITU (ICD-V45.02)- St. Jude Atlas V-242 MYOCARDIAL INFARCTION (ICD-410.90) CAD (ICD-414.00) ISCHEMIC HEART DISEASE (ICD-414.9) VENTRICULAR TACHYCARDIA (ICD-427.1) TACHYCARDIA (ICD-785.0)  Past Surgical History: Last updated: 08/18/2009 Endovascular aneurysm repair  Cardiac Cath X 2 St. Jude Atlas V-242  Vital Signs:  Patient profile:   70 year old male Height:      70 inches Weight:      184 pounds BMI:     26.50 Temp:     8 degrees F Pulse rate:   59 / minute BP sitting:   124 / 78  (left arm) Cuff size:   regular  Vitals Entered  By: Caralee Ates CMA (May 21, 2010 10:48 AM)  Physical Exam  General:  The patient was alert and oriented in no acute distress. HEENT Normal.  Neck veins were flat, carotids were brisk.  Lungs were clear.  Heart sounds were regular without murmurs or gallops.  Abdomen was soft with active bowel sounds. There is no clubbing cyanosis or edema. Skin Warm and dry     ICD Specifications Following MD:  Sherryl Manges, MD     Referring MD:  [facility] ICD Vendor:  St Jude     ICD  Model Number:  [phone]     ICD Serial Number:  [id number] ICD DOI:  05/22/2003     ICD Implanting MD:  Sherryl Manges, MD  Lead 1:    Location: RA     DOI: 05/22/2003     Model #: 1688TC     Serial #: [id number]     Status: active Lead 2:    Location: RV     DOI: 05/22/2003     Model #: 1580     Serial #: [id number]     Status: active  Indications::  SMVT   ICD Follow Up ICD Dependent:  No      Episodes Coumadin:  No  Brady Parameters Mode DDDR     Lower Rate Limit:  60     Upper Rate Limit 95 PAV 350     Sensed AV Delay:  300  Tachy Zones VF:  222     VT:  171     VT1:  115     Impression & Recommendations:  Problem # 1:  SINUS BRADYCARDIA (ICD-427.81) he is improved with up titration of his heart rate is 60.  Orders: EKG w/ Interpretation (93000)  Problem # 2:  VENTRICULAR TACHYCARDIA (ICD-427.1) He is having anfrequent ventricular tachycardia with one episode of pace terminated VT  Orders: EKG w/ Interpretation (93000)  His updated medication list for this problem includes:    Carvedilol 12.5 Mg Tabs (Carvedilol) .[name]... Take one tablet by mouth twice a day    Mexiletine Hcl 150 Mg Caps (Mexiletine hcl) .[name]... Take 1 capsule by mouth three times a day    Plavix 75 Mg Tabs (Clopidogrel bisulfate) .[name]... Take one tablet by mouth daily    Isosorbide Mononitrate Cr 30 Mg Xr24h-tab (Isosorbide mononitrate) .[name]... Take one tablet by mouth daily    Warfarin Sodium 1 Mg Tabs (Warfarin sodium) ..... Use as directed by anticoagualtion clinic    Sotalol Hcl 120 Mg Tabs (Sotalol hcl) .[name]... Take one and one half tablet by mouth twice a day    Aspirin 81 Mg Tbec (Aspirin) .[name]... Take one tablet by mouth daily    Amlodipine Besylate 5 Mg Tabs (Amlodipine besylate) ..... One by mouth daily  Problem # 3:  ISCHEMIC HEART DISEASE (ICD-414.9) stable on current meds;  recommendation his aspirin be discontinued  Problem # 4:  IMPLANTATION OF DEFIBRILLATOR,  ST. JUDE ATLAS V-242 (ICD-V45.02) Device parameters  and data were reviewed and no changes were made  With his symptoms of congestive heart failure and left bundle branch block, we will plan to upgrade his device to a CRT at generator replacement  Problem # 5:  LBBB (ICD-426.3) as above  Patient Instructions: 1)  Your physician recommends that you continue on your current medications as directed. Please refer to the Current Medication list given to you today. 2)  Your physician recommends that you schedule a follow-up appointment in: 1 month with  [facility]

## 2010-07-02 NOTE — Procedures (Signed)
Summary: Cardiology Device Clinic    Allergies: 1)  ! Prednisone 2)  * Ivp Dye   ICD Specifications Following MD:  Sherryl Manges, MD     Referring MD:  [facility] ICD Vendor:  St Jude     ICD Model Number:  [phone]     ICD Serial Number:  [id number] ICD DOI:  05/22/2003     ICD Implanting MD:  Sherryl Manges, MD  Lead 1:    Location: RA     DOI: 05/22/2003     Model #: 1688TC     Serial #: [id number]     Status: active Lead 2:    Location: RV     DOI: 05/22/2003     Model #: 1580     Serial #: [id number]     Status: active  Indications::  SMVT   ICD Follow Up Remote Check?  No Battery Voltage:  2.47 V     Charge Time:  15.3 seconds     ICD Dependent:  No       ICD Device Measurements Atrium:  Amplitude: 3.0 mV, Impedance: 590 ohms, Threshold: 1.0 V at 0.5 msec Right Ventricle:  Amplitude: 12 mV, Impedance: 375 ohms, Threshold: 0.75 V at 0.5 msec  Episodes MS Episodes:  0     Percent Mode Switch:  0     Coumadin:  Yes Shock:  0     ATP:  1     Nonsustained:  0     Atrial Pacing:  91%     Ventricular Pacing:  49%  Brady Parameters Mode DDDR     Lower Rate Limit:  60     Upper Rate Limit 95 PAV 350     Sensed AV Delay:  300  Tachy Zones VF:  222     VT:  171     VT1:  115     Tech Comments:  No parameter changes.  Checked by [employer].  Battery near [facility].  ROV 1 months clinic. Altha Harm, LPN  May 21, 2010 11:28 AM

## 2010-07-08 ENCOUNTER — Encounter: Payer: Self-pay | Admitting: Internal Medicine

## 2010-07-08 ENCOUNTER — Encounter (INDEPENDENT_AMBULATORY_CARE_PROVIDER_SITE_OTHER): Payer: Medicare Other | Admitting: Internal Medicine

## 2010-07-08 DIAGNOSIS — I472 Ventricular tachycardia: Secondary | ICD-10-CM

## 2010-07-08 DIAGNOSIS — Z95810 Presence of automatic (implantable) cardiac defibrillator: Secondary | ICD-10-CM

## 2010-07-08 DIAGNOSIS — I5022 Chronic systolic (congestive) heart failure: Secondary | ICD-10-CM

## 2010-07-08 NOTE — Assessment & Plan Note (Signed)
Summary: f14m/jml      Allergies Added:   Current Medications (verified): 1)  Alprazolam 0.25 Mg Tabs (Alprazolam) .... Take 1 Tablet By Mouth Four Times A Day 2)  Carvedilol 12.5 Mg Tabs (Carvedilol) .... Take One Tablet By Mouth Twice A Day 3)  Mexiletine Hcl 150 Mg Caps (Mexiletine Hcl) .... Take 1 Capsule By Mouth Three Times A Day 4)  Hyzaar 100-25 Mg Tabs (Losartan Potassium-Hctz) .... By Mouth Daily 5)  Klor-Con M20 20 Meq Cr-Tabs (Potassium Chloride Crys Cr) .... Three Times A Day 6)  Plavix 75 Mg Tabs (Clopidogrel Bisulfate) .... Take One Tablet By Mouth Daily 7)  Isosorbide Mononitrate Cr 30 Mg Xr24h-Tab (Isosorbide Mononitrate) .... Take One Tablet By Mouth Daily 8)  Warfarin Sodium 1 Mg Tabs (Warfarin Sodium) .... Use As Directed By Anticoagualtion Clinic 9)  Crestor 5 Mg Tabs (Rosuvastatin Calcium) .... Take One Capsule By Mouth Every Other Day 10)  Synthroid 88 Mcg Tabs (Levothyroxine Sodium) .... By Mouth One Daily 11)  Sotalol Hcl 120 Mg Tabs (Sotalol Hcl) .... Take One and One Half Tablet By Mouth Twice A Day 12)  Af-Loratadine 10 Mg Tabs (Loratadine) .... One By Mouth Daily As Needed 13)  Docusate Sodium 100 Mg Caps (Docusate Sodium) .... By Mouth As Needed 14)  Aspirin 81 Mg Tbec (Aspirin) .... Take One Tablet By Mouth Daily 15)  Multivitamins   Tabs (Multiple Vitamin) .... One By Mouth Daily 16)  Vitamin C 500 Mg  Tabs (Ascorbic Acid) .... One By Mouth Daily 17)  Amlodipine Besylate 5 Mg Tabs (Amlodipine Besylate) .... One By Mouth Daily 18)  Lovaza 1 Gm Caps (Omega-3-Acid Ethyl Esters) .... Take 1 Capsule By Mouth Two Times A Day  Allergies (verified): 1)  ! Prednisone 2)  * Ivp Dye    ICD Specifications Following MD:  Sherryl Manges, MD     Referring MD:  [facility] ICD Vendor:  St Jude     ICD Model Number:  [phone]     ICD Serial Number:  [id number] ICD DOI:  05/22/2003     ICD Implanting MD:  Sherryl Manges, MD  Lead 1:    Location: RA     DOI: 05/22/2003     Model #:  1688TC     Serial #: [id number]     Status: active Lead 2:    Location: RV     DOI: 05/22/2003     Model #: 1580     Serial #: [id number]     Status: active  Indications::  SMVT   ICD Follow Up Battery Voltage:  2.44 V     Charge Time:  15.3 seconds     ICD Dependent:  No       ICD Device Measurements Atrium:  Impedance: 540 ohms,  Right Ventricle:  Impedance: 375 ohms,  Shock Impedance: 38 ohms   Episodes MS Episodes:  0     Percent Mode Switch:  0     Coumadin:  Yes Shock:  0     ATP:  0     Nonsustained:  0     Atrial Therapies:  0 Atrial Pacing:  92%     Ventricular Pacing:  38%  Brady Parameters Mode DDDR     Lower Rate Limit:  60     Upper Rate Limit 95 PAV 350     Sensed AV Delay:  300  Tachy Zones VF:  222     VT:  171  VT1:  115     Next Cardiology Appt Due:  07/08/2010 Tech Comments:  DEVICE AT [facility].  PT SCHEDULED FOR 07-08-10 @ 1345 W/SK. Vella Kohler  June 25, 2010 10:11 AM

## 2010-07-10 ENCOUNTER — Telehealth: Payer: Self-pay | Admitting: Internal Medicine

## 2010-07-16 NOTE — Letter (Signed)
Summary: Implantable Device Instructions  Architectural technologist, Main Office  [address]   [place], [place] [address]   Phone: [phone]  Fax: [phone]      Implantable Device Instructions  You are scheduled for: CRT-D upgrade  on  July 29, 2010 with Dr. Graciela Husbands.  1.  Please arrive at the Short Stay Center at [facility] at 6:30 AM on the day of your procedure.  2.  Do not eat or drink the night before your procedure.  3.  Complete lab work on July 22, 2010..  The lab at [address] is open from 8:30 AM to 1:30 PM and from 2:30 PM to 5:00 PM.   .  You do not have to be fasting.  4.  Do NOT take these medications for ____ days prior to your procedure:  May take all medicines.  5.  Plan for an overnight stay.  Bring your insurance cards and a list of your medications.  6.  Wash your chest and neck with antibacterial soap (any brand) the evening before and the morning of your procedure.  Rinse well.   *If you have ANY questions after you get home, please call the office [phone].  *Every attempt is made to prevent procedures from being rescheduled.  Due to the nauture of Electrophysiology, rescheduling can happen.  The physician is always aware and directs the staff when this occurs.    Appended Document: Implantable Device Instructions Additional medication instructions:  Take Prednisone 60 mg by mouth at 6 PM evening before procedure and at 6 AM morning of procedure. (This will be 3 of the 20 mg tablets) Take Ranitidine 150 mg by mouth at 6 PM evening before procedure and at 6 AM morning of procedure. Take Benadryl 25 mg by mouth at 6 PM evening before procedure and at 6 AM morning of procedure.  Appended Document: Implantable Device Instructions mailed to pt

## 2010-07-16 NOTE — Progress Notes (Signed)
Summary: Medication prior to procedure   Pat  Medications Added PREDNISONE 20 MG TABS (PREDNISONE) Take 3 tablets at 6 PM evening before procedure and 3 tablets at 6 AM morning of procedure. RANITIDINE HCL 150 MG TABS (RANITIDINE HCL) Take one by mouth at 6PM evening before procedure and one by mouth at 6 AM morning of procedure.       Phone Note Outgoing Call   Summary of Call: Dr. Graciela Husbands reviewed previous cath procedure with Dr. Elvis Coil office  and pt was pretreated with by mouth prednisone at that time and tolerated without problems. Therefore Dr. Graciela Husbands  wants  to pretreat pt prior to CRT upgrade with following medicines: Prednisone 60 mg by mouth at 1800 evening before procedure and 0600 morning of procedure,  Ranitidine 150 mg by mouth at 1800 evening before procedure and at 0600 morning of procedure,  Benadryl 25 mg by mouth at 1800 evening before procedure and 0600 morning of procedure. Called pt to give him this information and left message to call back.  Initial call taken by: Dossie Arbour, RN, BSN,  July 10, 2010 10:02 AM  Follow-up for Phone Call        Pt given above information. He verbalizes understanding of instructions. I told pt I would update pre procedure instruction sheet and mail to pt. Prescriptions sent to [facility] on [address] at [facility] per pt. request. He is aware benadryl is over the counter. Follow-up by: Dossie Arbour, RN, BSN,  July 10, 2010 11:56 AM    New/Updated Medications: PREDNISONE 20 MG TABS (PREDNISONE) Take 3 tablets at 6 PM evening before procedure and 3 tablets at 6 AM morning of procedure. RANITIDINE HCL 150 MG TABS (RANITIDINE HCL) Take one by mouth at 6PM evening before procedure and one by mouth at 6 AM morning of procedure. Prescriptions: RANITIDINE HCL 150 MG TABS (RANITIDINE HCL) Take one by mouth at 6PM evening before procedure and one by mouth at 6 AM morning of procedure.  #2 x 0   Entered by:    Dossie Arbour, RN, BSN   Authorized by:   Nathen May, MD, [facility]   Signed by:   Dossie Arbour, RN, BSN on 07/10/2010   Method used:   Electronically to        [facility] (retail)       [address]       [place], [place]  [address]       Ph: [phone] or [phone]       Fax: [phone]   RxID:   [id number] PREDNISONE 20 MG TABS (PREDNISONE) Take 3 tablets at 6 PM evening before procedure and 3 tablets at 6 AM morning of procedure.  #6 x 0   Entered by:   Dossie Arbour, RN, BSN   Authorized by:   Nathen May, MD, [facility]   Signed by:   Dossie Arbour, RN, BSN on 07/10/2010   Method used:   Electronically to        [facility] (retail)       [address]       [place], [place]  [address]       Ph: [phone] or [phone]       Fax: [phone]   RxID:   [id number]

## 2010-07-16 NOTE — Assessment & Plan Note (Signed)
Summary: defib reached eri/mt      Allergies Added:   Referring Provider:  Ronny Flurry Primary Provider:  Dr. Evlyn Kanner  CC:  device at [facility]. 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 This occurs in the setting of ischemic heart disease prior revascularization and mild to moderate depression of LV systolic function.    He feels better than he has in a long time. He states it's related to take his mexiletine 3 times a day. His energy level is better.  There's been no peripheral edema;  he has mild exercise intolerance.  Reviewing his old chart he had 2 drug-eluting stents placed in the RCAand circumflex in 2005 by Dr. [place]. He has a history remotely of an embolism to his right foot and she is on Coumadin. He has been on triple drug therapy since the stents.  he is tolerating his medications relatively well.  Current Medications (verified): 1)  Alprazolam 0.25 Mg Tabs (Alprazolam) .... Take 1 Tablet By Mouth Four Times A Day 2)  Carvedilol 12.5 Mg Tabs (Carvedilol) .... Take One Tablet By Mouth Twice A Day 3)  Mexiletine Hcl 150 Mg Caps (Mexiletine Hcl) .... Take 1 Capsule By Mouth Three Times A Day 4)  Hyzaar 100-25 Mg Tabs (Losartan Potassium-Hctz) .... By Mouth Daily 5)  Klor-Con M20 20 Meq Cr-Tabs (Potassium Chloride Crys Cr) .... Three Times A Day 6)  Plavix 75 Mg Tabs (Clopidogrel Bisulfate) .... Take One Tablet By Mouth Daily 7)  Isosorbide Mononitrate Cr 30 Mg Xr24h-Tab (Isosorbide Mononitrate) .... Take One Tablet By Mouth Daily 8)  Warfarin Sodium 1 Mg Tabs (Warfarin Sodium) .... Use As Directed By Anticoagualtion Clinic 9)  Crestor 5 Mg Tabs (Rosuvastatin Calcium) .... Take One Capsule By Mouth Every Other Day 10)  Synthroid 88 Mcg Tabs (Levothyroxine Sodium) .... By Mouth One Daily 11)  Sotalol Hcl 120 Mg Tabs (Sotalol Hcl) .... Take One and One Half Tablet By Mouth Twice A Day 12)   Af-Loratadine 10 Mg Tabs (Loratadine) .... One By Mouth Daily As Needed 13)  Docusate Sodium 100 Mg Caps (Docusate Sodium) .... By Mouth As Needed 14)  Aspirin 81 Mg Tbec (Aspirin) .... Take One Tablet By Mouth Daily 15)  Multivitamins   Tabs (Multiple Vitamin) .... One By Mouth Daily 16)  Vitamin C 500 Mg  Tabs (Ascorbic Acid) .... One By Mouth Daily 17)  Amlodipine Besylate 5 Mg Tabs (Amlodipine Besylate) .... One By Mouth Daily 18)  Lovaza 1 Gm Caps (Omega-3-Acid Ethyl Esters) .... Take 1 Capsule By Mouth Two Times A Day  Allergies (verified): 1)  ! Prednisone 2)  * Ivp Dye  Past History:  Past Surgical History: Last updated: 08/18/2009 Endovascular aneurysm repair  Cardiac Cath X 2 St. 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 HEENT  normal . Neck veins were flat; carotids brisk and full without bruits. No lymphadenopathy. Back without kyphosis. Lungs clear. Heart sounds regular with a diminished S1without murmurs or gallops.  . Abdomen soft with active bowel sounds without midline pulsation or hepatomegaly. Femoral pulses  and distal pulses intact. Extremities were without clubbing cyanosis or edemaSkin warm and dry. Neurological exam grossly normal skin is warm and dry   EKG  Procedure date:  07/08/2010  Findings:      atrially paced rhythm with minimal the 0.31/0.15/44 The rate was 66 nonspecific IVCD with Q waves in lead one and L. with left axis deviation at -60    ICD Specifications Following MD:  Sherryl Manges, MD     Referring MD:  [facility] ICD Vendor:  St Jude     ICD Model Number:  [id number]     ICD Serial Number:  [id number] ICD DOI:  05/22/2003     ICD Implanting MD:  Sherryl Manges, MD  Lead 1:    Location: RA     DOI: 05/22/2003     Model #: [id number]     Serial #: [id number]     Status: active Lead 2:    Location: RV     DOI: 05/22/2003     Model #: 1580     Serial #: [id number]     Status: active  Indications::  SMVT   ICD Follow Up ICD Dependent:  No      Episodes Coumadin:  Yes  Brady Parameters Mode DDDR     Lower Rate Limit:  60     Upper Rate Limit 95 PAV 350     Sensed AV Delay:  300  Tachy Zones VF:  222     VT:  171     VT1:  115     Impression & Recommendations:  Problem # 1:  LBBB (ICD-426.3) the patient has a nonspecific left bundle branch light conduction abnormality. Based on the MADIT CRT results, I think it is reasonable to attempt upgrade to a CAT device. The fact that there is not monophasic R-wave in lead one and makes me a little bit concerned about the IVCD/non-left bundle branch block results. Recent data from [facility] clinic suggest that patient with prolonged QRS duration of greater than 150 ms response can be anticipated however in this group as well. His first degree AV block and left axis deviation are both also predictors of benefit. We have had a lengthy discussion regarding the risks and benefits associated with upgrade including but not limited to infection and poor arrhythmia. I told him that responders to CRT typically have a decreased ventricular arrhythmia burden  while those were nonresponders may in fact have a increased risk of arrhythmia burden.  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 He also has first degree AV block. Resynchronization might improve cardiac performance also on this basis.  Problem # 3:  IMPLANTATION OF DEFIBRILLATOR,  ST. JUDE ATLAS V-242 (ICD-V45.02) Device parameters and data were reviewed and no changes were made; his device has reached ERI. We have discussed potential benefits and risks of device generator explantation. These include but aren't limited to infection and lead fracture he understands these risks and  is willing to proceed.  I will anticipate discontinuing his Plavix prior to the procedure we'll also reduce his warfarin dose by 50%. The source of his arterial embolism is not clear; it has been no evidence of atrial fibrillation on his device  Problem # 4:  ISCHEMIC HEART DISEASE (ICD-414.9) this is stable following his catheterization and an approximate one year ago. We will obtain an echo to look at ventricular dimensions as to be able to better assess response to CRT  Problem # 5:  SYSTOLIC HEART FAILURE, CHRONIC (ICD-428.22)  stable   Orders: Echocardiogram (Echo)  Patient Instructions: 1)  Your physician has recommended you make the following change in your medication: Stop Plavix. 2)  Please return to our office on July 22, 2010 for lab work--BMP,PT, PTT, CBC with diff. 414.9 3)  Your physician has requested that you have an echocardiogram.  Echocardiography is a painless test that uses sound waves to create images of your heart. It provides your doctor with information about the size and shape of your heart and how well your heart's chambers and valves are working.  This procedure takes approximately one hour. There are no restrictions for this procedure. 4)  Your physician has recommended that you have a defibrillator upgrade.  An implantable cardioverter defibrillator (ICD)  is a small device that is placed in your chest or, in rare cases, your abdomen. This device uses electrical pulses or shocks to help control life-threatening, irregular heartbeats that could lead the heart to suddenly stop beating (sudden cardiac arrest). Leads are attached to the ICD that goes into your heart. This is done in the hospital and usually requires an overnight stay. Please see the instruction sheet given to you today for more information.

## 2010-07-22 ENCOUNTER — Encounter (INDEPENDENT_AMBULATORY_CARE_PROVIDER_SITE_OTHER): Payer: Self-pay

## 2010-07-22 ENCOUNTER — Other Ambulatory Visit: Payer: Self-pay | Admitting: Internal Medicine

## 2010-07-22 ENCOUNTER — Ambulatory Visit (HOSPITAL_COMMUNITY): Payer: Medicare Other | Attending: Family Medicine

## 2010-07-22 ENCOUNTER — Other Ambulatory Visit (INDEPENDENT_AMBULATORY_CARE_PROVIDER_SITE_OTHER): Payer: Medicare Other

## 2010-07-22 ENCOUNTER — Encounter: Payer: Self-pay | Admitting: Internal Medicine

## 2010-07-22 DIAGNOSIS — I059 Rheumatic mitral valve disease, unspecified: Secondary | ICD-10-CM | POA: Insufficient documentation

## 2010-07-22 DIAGNOSIS — I2510 Atherosclerotic heart disease of native coronary artery without angina pectoris: Secondary | ICD-10-CM

## 2010-07-22 DIAGNOSIS — I5022 Chronic systolic (congestive) heart failure: Secondary | ICD-10-CM

## 2010-07-22 DIAGNOSIS — I252 Old myocardial infarction: Secondary | ICD-10-CM | POA: Insufficient documentation

## 2010-07-22 DIAGNOSIS — I079 Rheumatic tricuspid valve disease, unspecified: Secondary | ICD-10-CM | POA: Insufficient documentation

## 2010-07-22 DIAGNOSIS — I509 Heart failure, unspecified: Secondary | ICD-10-CM

## 2010-07-22 DIAGNOSIS — I379 Nonrheumatic pulmonary valve disorder, unspecified: Secondary | ICD-10-CM | POA: Insufficient documentation

## 2010-07-22 LAB — BASIC METABOLIC PANEL
Calcium: 9.4 mg/dL (ref 8.4–10.5)
Chloride: 104 mEq/L (ref 96–112)
Creatinine, Ser: 1.1 mg/dL (ref 0.4–1.5)
Sodium: 141 mEq/L (ref 135–145)

## 2010-07-22 LAB — CBC WITH DIFFERENTIAL/PLATELET
Basophils Relative: 0.7 % (ref 0.0–3.0)
Eosinophils Relative: 1.3 % (ref 0.0–5.0)
Lymphocytes Relative: 27.2 % (ref 12.0–46.0)
Neutrophils Relative %: 60 % (ref 43.0–77.0)
RBC: 4.11 Mil/uL — ABNORMAL LOW (ref 4.22–5.81)
WBC: 6.3 10*3/uL (ref 4.5–10.5)

## 2010-07-22 LAB — PROTIME-INR
INR: 2.1 ratio — ABNORMAL HIGH (ref 0.8–1.0)
Prothrombin Time: 21.5 s — ABNORMAL HIGH (ref 10.2–12.4)

## 2010-07-28 NOTE — Miscellaneous (Signed)
Summary: IV for Definity Echo  Clinical Lists Changes     IV 22G angiocath (R) hand for Definity Echo. Cleon Gustin,

## 2010-07-29 ENCOUNTER — Inpatient Hospital Stay (HOSPITAL_COMMUNITY)
Admission: RE | Admit: 2010-07-29 | Discharge: 2010-07-30 | DRG: 261 | Disposition: A | Discharge status: Home / Self Care | Payer: Medicare Other | Source: Ambulatory Visit | Attending: Internal Medicine | Admitting: Internal Medicine

## 2010-07-29 ENCOUNTER — Ambulatory Visit (HOSPITAL_COMMUNITY): Payer: Medicare Other

## 2010-07-29 DIAGNOSIS — I2510 Atherosclerotic heart disease of native coronary artery without angina pectoris: Secondary | ICD-10-CM | POA: Diagnosis present

## 2010-07-29 DIAGNOSIS — Z9861 Coronary angioplasty status: Secondary | ICD-10-CM

## 2010-07-29 DIAGNOSIS — I5022 Chronic systolic (congestive) heart failure: Secondary | ICD-10-CM

## 2010-07-29 DIAGNOSIS — E785 Hyperlipidemia, unspecified: Secondary | ICD-10-CM | POA: Diagnosis present

## 2010-07-29 DIAGNOSIS — I2589 Other forms of chronic ischemic heart disease: Principal | ICD-10-CM | POA: Diagnosis present

## 2010-07-29 DIAGNOSIS — I472 Ventricular tachycardia, unspecified: Secondary | ICD-10-CM | POA: Diagnosis present

## 2010-07-29 DIAGNOSIS — I252 Old myocardial infarction: Secondary | ICD-10-CM

## 2010-07-29 DIAGNOSIS — I509 Heart failure, unspecified: Secondary | ICD-10-CM | POA: Diagnosis present

## 2010-07-29 DIAGNOSIS — I447 Left bundle-branch block, unspecified: Secondary | ICD-10-CM | POA: Diagnosis present

## 2010-07-29 DIAGNOSIS — I10 Essential (primary) hypertension: Secondary | ICD-10-CM | POA: Diagnosis present

## 2010-07-29 DIAGNOSIS — I4729 Other ventricular tachycardia: Secondary | ICD-10-CM | POA: Diagnosis present

## 2010-07-29 LAB — PROTIME-INR
INR: 1.87 — ABNORMAL HIGH (ref 0.00–1.49)
Prothrombin Time: 21.7 seconds — ABNORMAL HIGH (ref 11.6–15.2)

## 2010-07-30 ENCOUNTER — Inpatient Hospital Stay (HOSPITAL_COMMUNITY): Payer: Medicare Other

## 2010-07-30 DIAGNOSIS — I472 Ventricular tachycardia: Secondary | ICD-10-CM

## 2010-07-30 LAB — PROTIME-INR: Prothrombin Time: 27.2 seconds — ABNORMAL HIGH (ref 11.6–15.2)

## 2010-08-04 ENCOUNTER — Encounter: Payer: Self-pay | Admitting: Internal Medicine

## 2010-08-05 ENCOUNTER — Ambulatory Visit (INDEPENDENT_AMBULATORY_CARE_PROVIDER_SITE_OTHER): Payer: Medicare Other | Admitting: Cardiology

## 2010-08-05 ENCOUNTER — Ambulatory Visit (INDEPENDENT_AMBULATORY_CARE_PROVIDER_SITE_OTHER): Payer: Medicare Other

## 2010-08-05 ENCOUNTER — Encounter: Payer: Self-pay | Admitting: Internal Medicine

## 2010-08-05 DIAGNOSIS — R0602 Shortness of breath: Secondary | ICD-10-CM

## 2010-08-05 DIAGNOSIS — Z7901 Long term (current) use of anticoagulants: Secondary | ICD-10-CM

## 2010-08-05 DIAGNOSIS — I2589 Other forms of chronic ischemic heart disease: Secondary | ICD-10-CM

## 2010-08-05 DIAGNOSIS — I428 Other cardiomyopathies: Secondary | ICD-10-CM

## 2010-08-05 DIAGNOSIS — Z950 Presence of cardiac pacemaker: Secondary | ICD-10-CM

## 2010-08-11 NOTE — Miscellaneous (Signed)
Summary: Device change out  Clinical Lists Changes  Observations: Added new observation of ICDLEADSTAT3: active (08/04/2010 7:04) Added new observation of ICDLEADSER3: [id number] (08/04/2010 7:04) Added new observation of ICDLEADMOD3: 1458Q (08/04/2010 7:04) Added new observation of ICDLEADDOI3: 07/29/2010 (08/04/2010 7:04) Added new observation of ICDLEADLOC3: LV (08/04/2010 7:04) Added new observation of ICD IMPL DTE: 07/29/2010 (08/04/2010 7:04) Added new observation of ICD SERL#: [id number]  (08/04/2010 7:04) Added new observation of ICD MODL#: [id number]  (08/04/2010 9:56) Added new observation of ICDEXPLCOMM: 07/29/2010 St. Jude Atlas V242/137033 explanted.  (08/04/2010 7:04)       ICD Specifications Following MD:  Sherryl Manges, MD     Referring MD:  [facility] ICD Vendor:  St Jude     ICD Model Number:  [id number]     ICD Serial Number:  [id number] ICD DOI:  07/29/2010     ICD Implanting MD:  Sherryl Manges, MD  Lead 1:    Location: RA     DOI: 05/22/2003     Model #: 1688TC     Serial #: [id number]     Status: active Lead 2:    Location: RV     DOI: 05/22/2003     Model #: 1580     Serial #: [id number]     Status: active Lead 3:    Location: LV     DOI: 07/29/2010     Model #: 1458Q     Serial #: [id number]     Status: active  Indications::  SMVT  Explantation Comments: 07/29/2010 St. Jude Atlas V242/137033 explanted.  ICD Follow Up ICD Dependent:  No      Episodes Coumadin:  Yes  Brady Parameters Mode DDDR     Lower Rate Limit:  60     Upper Rate Limit 95 PAV 350     Sensed AV Delay:  300  Tachy Zones VF:  222     VT:  171     VT1:  115

## 2010-08-14 NOTE — Discharge Summary (Signed)
[name], [name] NO.:  [id number]  MEDICAL RECORD NO.:  [id number]           PATIENT TYPE:  I  LOCATION:  2033                         FACILITY:  [facility]  PHYSICIAN:  [name] Range, MD       DATE OF BIRTH:  [birth year]  DATE OF ADMISSION:  07/29/2010 DATE OF DISCHARGE:  07/30/2010                              DISCHARGE SUMMARY   PRIMARY CARE PHYSICIAN:  Jeannett Senior A. Saint Martin, MD  PRIMARY CARDIOLOGIST:  Cassell Clement, MD  ELECTROPHYSIOLOGIST:  [name], MD, [facility]  PRIMARY DIAGNOSIS:  Ischemic cardiomyopathy with an ejection fraction of less than 35% with previously implanted implantable cardiac defibrillator, now elective replacement indicator.  SECONDARY DIAGNOSES: 1. Left bundle-branch block with an intrinsic QRS of 154 msec. 2. Ventricular tachycardia treated with sotalol and mexiletine. 3. Coronary artery disease - status post myocardial infarction in     1991, treated at that time with percutaneous transluminal coronary     angioplasty.  Cardiac catheterization in 2005 showed moderate to     severe stenosis of the mid circumflex and severe stenosis of the     proximal right coronary artery, treated with drug-eluting stents at     that time. 4. Status post stent repair of abdominal aortic aneurysm by Dr. Madilyn Fireman     in 2009. 5. New [place] Heart Association Class II chronic systolic congestive     heart failure. 6. Hypertension. 7. Dyslipidemia. 8. 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  PROCEDURES THIS ADMISSION: 1. Implantable cardiac defibrillator generator change with     implantation of a new left ventricular lead.  The patient received     a St. Jude Medical model number [phone] implantable cardioverter-     defibrillator with model number 1458Q left ventricular lead.  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  The previously implanted  1688 right atrial and 1580     right ventricular leads were used.  The patient's right ventricular     lead was examined under fluoroscopy and showed no signs of externalalized     conductors at the time of implant. 2. Chest x-ray on July 30, 2010, reviewed by Dr. Johney Frame demonstrated     no pneumothorax status post device upgrade.  ALLERGIES:  The patient is allergic to ZOCOR, CONTRAST MEDIA, AMIODARONE, and PREDNISONE.  BRIEF HISTORY OF PRESENT ILLNESS:  [name] is a 70 year old male with a history of ischemic cardiomyopathy status post ICD implantation in 2004.  He has since had recurrent episodes of ventricular tachycardia, treated most recently with a combination of mexiletine and sotalol.  His defibrillator reached elective replacement indicator.  He was seen by Dr. Graciela Husbands in the outpatient setting.  Based on the MADIT-CRT results, it was felt it would be reasonable to attempt upgrade to a CRT device at the time of change out.  Risks, benefits, and alternatives of this procedure were discussed with the patient, and he wished to proceed.  HOSPITAL COURSE:  The patient was admitted on  May 29, 2011, for planned ICD generator change and upgrade to CRT device.  This was carried out by Dr. Graciela Husbands with details as outlined above.  He was monitored on telemetry overnight with demonstrated AV pacing with occasional PVCs.  His left and right chest incisions were without hematoma.  Chest x-ray was obtained and demonstrated no pneumothorax status post upgrade as reviewed per Dr. Johney Frame.  The patient's INR on July 30, 2010, was 2.1.  Dr. Johney Frame examined the patient and felt him stable for discharge.  Of note, an EKG was obtained, and his QRS duration is 114 milliseconds with BiV pacing.  FOLLOWUP APPOINTMENTS: 1. [facility] on August 05, 2010, at 10:00 a.m. 2. Dr. Patty Sermons with an INR check on August 05, 2010, at 9:15 a.m. 3. Dr. Graciela Husbands in June 2012 - the office  will call to schedule that     appointment. 4. Dr. Evlyn Kanner as scheduled.  DISCHARGE INSTRUCTIONS: 1. Increase activity slowly. 2. No driving for one week. 3. Follow low-sodium heart-healthy diet. 4. See supplemental device discharge instructions for wound care and     arm mobility. 5. Keep incisions clean and dry for 1 week.  DISCHARGE MEDICATIONS: 1. Xanax 0.25 mg one tablet every 6 hours as needed for anxiety. 2. Amlodipine 5 mg daily. 3. Aspirin 81 mg daily. 4. Coreg 12.5 mg twice daily. 5. Crestor 5 mg every 48 hours. 6. Docusate 100 mg daily. 7. Fish oil one capsule twice daily. 8. Hyzaar 100/25 mg daily. 9. Imdur 30 mg daily. 10.Loratadine 10 mg daily. 11.Meclizine 25 mg twice daily. 12.Mexiletine 150 mg every 8 hours. 13.Multivitamin daily. 14.Potassium chloride 20 mEq one tablet three times daily. 15.Sotalol 120 mg one and half tablet twice daily. 16.Synthroid 88 mcg daily. 17.Vitamin C daily. 18.Warfarin as directed by [facility].  DISPOSITION:  The patient was seen and examined by Dr. Johney Frame on July 30, 2010, and considered stable for discharge.  DURATION OF DISCHARGE ENCOUNTER:  Thirty-five minutes.     Gypsy Balsam, RN,BSN   ______________________________ [name] Range, MD    AS/MEDQ  D:  07/30/2010  T:  07/30/2010  Job:  [id number]  cc:   [name], MD, [facility] Cassell Clement, M.D. [employer]era Mater. Evlyn Kanner, M.D.  Electronically Signed by Gypsy Balsam RNBSN on 08/04/2010 09:37:17 AM Electronically Signed by [name] Range MD on 08/14/2010 10:52:58 PM

## 2010-08-18 NOTE — Cardiovascular Report (Signed)
Summary: Office Visit   Office Visit   Imported By: Roderic Ovens 08/14/2010 16:08:18  _____________________________________________________________________  External Attachment:    Type:   Image     Comment:   External Document

## 2010-08-18 NOTE — Procedures (Signed)
Summary: wound check.sjm.amber      Allergies Added:   Current Medications (verified): 1)  Alprazolam 0.25 Mg Tabs (Alprazolam) .... Take 1 Tablet By Mouth Four Times A Day 2)  Carvedilol 12.5 Mg Tabs (Carvedilol) .... Take One Tablet By Mouth Twice A Day 3)  Mexiletine Hcl 150 Mg Caps (Mexiletine Hcl) .... Take 1 Capsule By Mouth Three Times A Day 4)  Hyzaar 100-25 Mg Tabs (Losartan Potassium-Hctz) .... By Mouth Daily 5)  Klor-Con M20 20 Meq Cr-Tabs (Potassium Chloride Crys Cr) .... Three Times A Day 6)  Isosorbide Mononitrate Cr 30 Mg Xr24h-Tab (Isosorbide Mononitrate) .... Take One Tablet By Mouth Daily 7)  Warfarin Sodium 1 Mg Tabs (Warfarin Sodium) .... Use As Directed By Anticoagualtion Clinic 8)  Crestor 5 Mg Tabs (Rosuvastatin Calcium) .... Take One Capsule By Mouth Every Other Day 9)  Synthroid 88 Mcg Tabs (Levothyroxine Sodium) .... By Mouth One Daily 10)  Sotalol Hcl 120 Mg Tabs (Sotalol Hcl) .... Take One and One Half Tablet By Mouth Twice A Day 11)  Af-Loratadine 10 Mg Tabs (Loratadine) .... One By Mouth Daily As Needed 12)  Docusate Sodium 100 Mg Caps (Docusate Sodium) .... By Mouth As Needed 13)  Aspirin 81 Mg Tbec (Aspirin) .... Take One Tablet By Mouth Daily 14)  Multivitamins   Tabs (Multiple Vitamin) .... One By Mouth Daily 15)  Vitamin C 500 Mg  Tabs (Ascorbic Acid) .... One By Mouth Daily 16)  Amlodipine Besylate 5 Mg Tabs (Amlodipine Besylate) .... One By Mouth Daily 17)  Lovaza 1 Gm Caps (Omega-3-Acid Ethyl Esters) .... Take 1 Capsule By Mouth Two Times A Day 18)  Prednisone 20 Mg Tabs (Prednisone) .... Take 3 Tablets At 6 Pm Evening Before Procedure and 3 Tablets At 6 Am Morning of Procedure. 19)  Ranitidine Hcl 150 Mg Tabs (Ranitidine Hcl) .... Take One By Mouth At 6pm Evening Before Procedure and One By Mouth At 6 Am Morning of Procedure.  Allergies (verified): 1)  ! Prednisone 2)  * Ivp Dye   ICD Specifications Following MD:  Sherryl Manges, MD     Referring  MD:  [facility] ICD Vendor:  St Jude     ICD Model Number:  [phone]     ICD Serial Number:  [id number] ICD DOI:  07/29/2010     ICD Implanting MD:  Sherryl Manges, MD  Lead 1:    Location: RA     DOI: 05/22/2003     Model #: 1688TC     Serial #: [id number]     Status: active Lead 2:    Location: RV     DOI: 05/22/2003     Model #: 1580     Serial #: [id number]     Status: active Lead 3:    Location: LV     DOI: 07/29/2010     Model #: 1458Q     Serial #: [id number]     Status: active  Indications::  SMVT  Explantation Comments: 07/29/2010 St. Jude Atlas V242/137033 explanted.  ICD Follow Up Remote Check?  No ICD Dependent:  No      Episodes Coumadin:  Yes  Brady Parameters Mode DDDR     Lower Rate Limit:  60     Upper Rate Limit 95 PAV 350     Sensed AV Delay:  300  Tachy Zones VF:  222     VT:  171     VT1:  115     Tech Comments:  See PaceArt

## 2010-08-20 NOTE — Op Note (Signed)
NAMEGABERIEL, YOUNGBLOOD NO.:  [id number]  MEDICAL RECORD NO.:  [id number]           PATIENT TYPE:  I  LOCATION:  2033                         FACILITY:  [facility]  PHYSICIAN:  [name], MD, FACCDATE OF BIRTH:  11-06-1940  DATE OF PROCEDURE:  07/29/2010 DATE OF DISCHARGE:                              OPERATIVE REPORT   PREOPERATIVE DIAGNOSES: 1. Previously implanted defibrillator for ventricular tachycardia. 2. Congestive heart failure. 3. Broad QRS.  POSTOPERATIVE DIAGNOSES: 1. Previously implanted defibrillator for ventricular tachycardia. 2. Congestive heart failure. 3. Broad QRS. 4. Occluded innominate vein.  PROCEDURES:  Contrast venogram, explantation of device, implantation of device, pocket revision, left ventricular lead placement with tunneling from the right to the left side.  Following obtaining informed consent, the patient was brought to the electrophysiology laboratory and placed on the fluoroscopic table in supine position.  After routine prep and drape of the left upper chest, a contrast venogram having been obtained and demonstrating patency of the extrathoracic left subclavian vein up to the midportion of the innominate, incision was made and carried down to layer of the prepectoral fascia, the pocket being kept closed.  A micropuncture kit was used and access to the left subclavian vein was obtained.  The wire would not pass past the midportion of the innominate vein.  We then placed the sheath into the vein (5-French) and took another venogram, which demonstrated occlusion of the innominate with collateralization. There was also collateralization to what appeared to be the azygos vein. We then cannulated the azygos vein and probed with a wire to see if we could get to the coronary sinus retrograde.  We were not able to do this.  At this point, it was elected to abandon the left-sided approach. We removed the previously implanted  device, assessed the wires demonstrating a P-wave of 3.3 with a pace impedance of 597, threshold 1.6 at 0.5, the R-wave was 14.8 with a pace impedance of 385, threshold of 1 V at 0.5.  These leads were then attached to a St. Jude Quartet ICD, model number [id number], serial number [id number], and was placed back in the pocket.  The pocket was closed in 2 layers in normal fashion with a Tegaderm dressing applied.  At this point, the patient was reprepped with exposure to both the right and left subclavian areas.  After routine prep and drape of the right upper chest, lidocaine was infiltrated and an incision was made and carried down to layer of the prepectoral fascia using electrocautery and sharp dissection.  No pocket was created except for a small space of about 3 inches in diameter. Venous access was obtained.  A 9.5-French sheath was placed and Medtronic MB2 coronary sinus cannulation catheter was used that allowed for surprisingly easy access to the coronary sinus.  Contrast venogram demonstrated a mid lateral vein, which we then cannulated with a whisper wire.  Over this wire, we placed a Quartet lead, model 1458Q, serial number [id number].  This lead was positioned as far into the vein as could be accomplished, which was at the junction of the mid and distal thirds.  We then used lead  positions 3 and 4, and identified an L wave of 15.1 with a pace impedance of 1344, and threshold of 2 volts at 0.5. The deployment system was removed, the lead was secured both orthodromic to the access, and then a loop was made and antidromic to the access. We then tunneled from right towards left; and then at this point, we returned to the left side, anesthetized again, opened up the left-sided pocket, removed the device, freed up the leads, and then used a tunneling tool from right to left again so that allowed for [place] to draw the left ventricular lead having been placed on the right side to the left-sided  pocket.  The right pocket was then copiously irrigated with antibiotic-containing saline solution.  Hemostasis was assured and the wound was closed in 3 layers in normal fashion.  The wound was washed, dried, and a benzoin and Steri-Strip dressing was applied.  At this point, the left-sided pocket was inspected.  Hemostasis was obtained.  Some Surgicel was placed at the cephalad aspect of the pocket, which had to be extended about a centimeter to allow for the placement of the larger header.  Hemostasis was obtained.  The leads and pulse generator were placed in the pocket.  The wound was closed in 3 layers in a normal fashion.  Wound was washed, dried, and benzoin and Steri-Strip dressing was applied.  Needle counts, sponge counts, and instrument counts were correct at the end of procedure according to the staff.  The patient tolerated the procedure without apparent complication.     [name], MD, [facility]     SCK/MEDQ  D:  07/29/2010  T:  07/30/2010  Job:  [id number]  Electronically Signed by Sherryl Manges MD [facility] on 08/20/2010 08:33:53 AM

## 2010-08-23 ENCOUNTER — Other Ambulatory Visit: Payer: Self-pay | Admitting: Cardiology

## 2010-08-23 DIAGNOSIS — E876 Hypokalemia: Secondary | ICD-10-CM

## 2010-08-23 DIAGNOSIS — I499 Cardiac arrhythmia, unspecified: Secondary | ICD-10-CM

## 2010-08-27 NOTE — Telephone Encounter (Signed)
[facility] °

## 2010-08-31 LAB — APTT: aPTT: 32 seconds (ref 24–37)

## 2010-08-31 LAB — CK TOTAL AND CKMB (NOT AT ARMC): CK, MB: 1.7 ng/mL (ref 0.3–4.0)

## 2010-08-31 LAB — DIFFERENTIAL
Basophils Absolute: 0.1 10*3/uL (ref 0.0–0.1)
Eosinophils Absolute: 0 10*3/uL (ref 0.0–0.7)
Eosinophils Relative: 0 % (ref 0–5)
Lymphocytes Relative: 20 % (ref 12–46)

## 2010-08-31 LAB — PREPARE FRESH FROZEN PLASMA

## 2010-08-31 LAB — BASIC METABOLIC PANEL
BUN: 13 mg/dL (ref 6–23)
BUN: 9 mg/dL (ref 6–23)
CO2: 27 mEq/L (ref 19–32)
Calcium: 8.8 mg/dL (ref 8.4–10.5)
Chloride: 101 mEq/L (ref 96–112)
Chloride: 106 mEq/L (ref 96–112)
GFR calc non Af Amer: >60 mL/min (ref >60)
GFR calc non Af Amer: >60 mL/min (ref >60)
Glucose, Bld: 116 mg/dL — ABNORMAL HIGH (ref 70–99)
Glucose, Bld: 98 mg/dL (ref 70–99)
Potassium: 3.6 mEq/L (ref 3.5–5.1)
Potassium: 4 mEq/L (ref 3.5–5.1)
Potassium: 4.7 mEq/L (ref 3.5–5.1)
Sodium: 138 mEq/L (ref 135–145)
Sodium: 141 mEq/L (ref 135–145)

## 2010-08-31 LAB — CBC
HCT: 38.4 % — ABNORMAL LOW (ref 39.0–52.0)
HCT: 42.6 % (ref 39.0–52.0)
Platelets: 248 10*3/uL (ref 150–400)
Platelets: 266 10*3/uL (ref 150–400)
RDW: 12.7 % (ref 11.5–15.5)
WBC: 17.2 10*3/uL — ABNORMAL HIGH (ref 4.0–10.5)

## 2010-08-31 LAB — BRAIN NATRIURETIC PEPTIDE: Pro B Natriuretic peptide (BNP): 583 pg/mL — ABNORMAL HIGH (ref 0.0–100.0)

## 2010-08-31 LAB — T4, FREE: Free T4: 1.34 ng/dL (ref 0.80–1.80)

## 2010-08-31 LAB — CARDIAC PANEL(CRET KIN+CKTOT+MB+TROPI)
CK, MB: 1.3 ng/mL (ref 0.3–4.0)
CK, MB: 1.4 ng/mL (ref 0.3–4.0)
Relative Index: INVALID (ref 0.0–2.5)
Total CK: 54 U/L (ref 7–232)
Total CK: 61 U/L (ref 7–232)
Troponin I: 0.03 ng/mL (ref 0.00–0.06)

## 2010-08-31 LAB — TSH: TSH: 0.983 u[IU]/mL (ref 0.350–4.500)

## 2010-08-31 LAB — POCT CARDIAC MARKERS: CKMB, poc: 1.1 ng/mL (ref 1.0–8.0)

## 2010-08-31 LAB — PROTIME-INR
INR: 1.63 — ABNORMAL HIGH (ref 0.00–1.49)
Prothrombin Time: 19.6 seconds — ABNORMAL HIGH (ref 11.6–15.2)
Prothrombin Time: 23.8 seconds — ABNORMAL HIGH (ref 11.6–15.2)

## 2010-08-31 LAB — T3: T3, Total: 82.5 ng/dl (ref 80.0–204.0)

## 2010-09-07 ENCOUNTER — Ambulatory Visit (INDEPENDENT_AMBULATORY_CARE_PROVIDER_SITE_OTHER): Payer: Medicare Other | Admitting: *Deleted

## 2010-09-07 DIAGNOSIS — I255 Ischemic cardiomyopathy: Secondary | ICD-10-CM

## 2010-09-07 DIAGNOSIS — I2589 Other forms of chronic ischemic heart disease: Secondary | ICD-10-CM

## 2010-09-07 DIAGNOSIS — Z7901 Long term (current) use of anticoagulants: Secondary | ICD-10-CM

## 2010-09-12 ENCOUNTER — Other Ambulatory Visit: Payer: Self-pay | Admitting: Cardiology

## 2010-09-12 DIAGNOSIS — I499 Cardiac arrhythmia, unspecified: Secondary | ICD-10-CM

## 2010-09-15 NOTE — Telephone Encounter (Signed)
Refill per escribe request.  

## 2010-09-25 ENCOUNTER — Other Ambulatory Visit: Payer: Self-pay | Admitting: *Deleted

## 2010-09-25 DIAGNOSIS — R42 Dizziness and giddiness: Secondary | ICD-10-CM

## 2010-09-25 MED ORDER — MECLIZINE HCL 32 MG PO TABS: 32.0000 mg | ORAL_TABLET | Freq: Three times a day (TID) | ORAL | Status: DC | PRN

## 2010-09-25 NOTE — Telephone Encounter (Signed)
Patient came in requesting rx for meclizine be sent to [facility]

## 2010-09-26 ENCOUNTER — Other Ambulatory Visit: Payer: Self-pay | Admitting: Cardiology

## 2010-09-26 DIAGNOSIS — R42 Dizziness and giddiness: Secondary | ICD-10-CM

## 2010-09-26 DIAGNOSIS — I119 Hypertensive heart disease without heart failure: Secondary | ICD-10-CM

## 2010-09-26 DIAGNOSIS — I2510 Atherosclerotic heart disease of native coronary artery without angina pectoris: Secondary | ICD-10-CM

## 2010-09-28 MED ORDER — MECLIZINE HCL 25 MG PO TABS: 25.0000 mg | ORAL_TABLET | Freq: Three times a day (TID) | ORAL | Status: DC | PRN

## 2010-09-28 NOTE — Telephone Encounter (Signed)
escribe request Meclizine rx should have been 25 mg not 32 mg

## 2010-10-02 ENCOUNTER — Encounter: Payer: Self-pay | Admitting: Nurse Practitioner

## 2010-10-05 ENCOUNTER — Ambulatory Visit (INDEPENDENT_AMBULATORY_CARE_PROVIDER_SITE_OTHER): Payer: Medicare Other | Admitting: Nurse Practitioner

## 2010-10-05 ENCOUNTER — Ambulatory Visit (INDEPENDENT_AMBULATORY_CARE_PROVIDER_SITE_OTHER): Payer: Medicare Other | Admitting: *Deleted

## 2010-10-05 ENCOUNTER — Encounter: Payer: Self-pay | Admitting: Nurse Practitioner

## 2010-10-05 VITALS — BP 120/80 | HR 60 | Wt 183.0 lb

## 2010-10-05 DIAGNOSIS — I259 Chronic ischemic heart disease, unspecified: Secondary | ICD-10-CM

## 2010-10-05 DIAGNOSIS — I5022 Chronic systolic (congestive) heart failure: Secondary | ICD-10-CM

## 2010-10-05 DIAGNOSIS — I472 Ventricular tachycardia: Secondary | ICD-10-CM

## 2010-10-05 DIAGNOSIS — I509 Heart failure, unspecified: Secondary | ICD-10-CM

## 2010-10-05 DIAGNOSIS — R0600 Dyspnea, unspecified: Secondary | ICD-10-CM

## 2010-10-05 DIAGNOSIS — R0989 Other specified symptoms and signs involving the circulatory and respiratory systems: Secondary | ICD-10-CM

## 2010-10-05 DIAGNOSIS — R0609 Other forms of dyspnea: Secondary | ICD-10-CM

## 2010-10-05 DIAGNOSIS — Z7901 Long term (current) use of anticoagulants: Secondary | ICD-10-CM

## 2010-10-05 DIAGNOSIS — I2589 Other forms of chronic ischemic heart disease: Secondary | ICD-10-CM

## 2010-10-05 DIAGNOSIS — I255 Ischemic cardiomyopathy: Secondary | ICD-10-CM

## 2010-10-05 DIAGNOSIS — I5020 Unspecified systolic (congestive) heart failure: Secondary | ICD-10-CM

## 2010-10-05 LAB — BASIC METABOLIC PANEL
BUN: 15 mg/dL (ref 6–23)
CO2: 30 mEq/L (ref 19–32)
Calcium: 9.2 mg/dL (ref 8.4–10.5)
Chloride: 104 mEq/L (ref 96–112)
Creatinine, Ser: 1.2 mg/dL (ref 0.4–1.5)
GFR: 66.35 mL/min (ref >60.00)
Glucose, Bld: 98 mg/dL (ref 70–99)
Potassium: 4.5 mEq/L (ref 3.5–5.1)
Sodium: 139 mEq/L (ref 135–145)

## 2010-10-05 LAB — BRAIN NATRIURETIC PEPTIDE: Pro B Natriuretic peptide (BNP): 391 pg/mL — ABNORMAL HIGH (ref 0.0–100.0)

## 2010-10-05 NOTE — Assessment & Plan Note (Signed)
He seems to be doing well and has benefited from his LV lead placement. He would appear to be NYHA Class 1 to 2. We will keep him on his current medicines. I will have him see Dr. Patty Sermons in about 2 months. Labs and INR are checked today. Patient is agreeable to this plan and will call if any problems develop in the interim.

## 2010-10-05 NOTE — Assessment & Plan Note (Signed)
Bi V ICD is in place. He will be seeing Dr. Graciela Husbands next month.

## 2010-10-05 NOTE — Progress Notes (Signed)
[name] Date of Birth: [birth year]   History of Present Illness: [name] is seen today for his 2 month visit. He is seen for Dr. Patty Sermons. Overall he thinks he is doing ok. He continues to walk for an hour about 5 days per week. He is not really short of breath. He is not having chest pain. His weight is stable. He denies edema, abdominal bloating, or lightheadedness. He is not having any ICD shocks. He does still have soreness over his ICD site. He had his generator changed out at the end of February and had an LV lead placed. The LV lead had to be tunneled from the right side. He will be seeing Dr. Graciela Husbands in June.   Current Outpatient Prescriptions on File Prior to Visit  Medication Sig Dispense Refill  . ALPRAZolam (XANAX) 0.25 MG tablet Take 0.25 mg by mouth at bedtime as needed.        [name] amLODipine (NORVASC) 5 MG tablet Take 5 mg by mouth at bedtime.        . Ascorbic Acid (VITAMIN C) 500 MG tablet Take 500 mg by mouth daily.        [name] aspirin 81 MG tablet Take 81 mg by mouth daily.        . carvedilol (COREG) 12.5 MG tablet Take 12.5 mg by mouth 2 (two) times daily.        [name] DOCUSATE CALCIUM PO Take 81 mg by mouth daily.        . isosorbide mononitrate (IMDUR) 30 MG 24 hr tablet TAKE ONE TABLET DAILY  90 tablet  3  . levothyroxine (SYNTHROID, LEVOTHROID) 88 MCG tablet Take 88 mcg by mouth daily.        . Loratadine (CLARITIN) 10 MG CAPS Take by mouth daily.        [name] losartan-hydrochlorothiazide (HYZAAR) 100-25 MG per tablet TAKE ONE TABLET DAILY  90 tablet  3  . meclizine (ANTIVERT) 25 MG tablet Take 1 tablet (25 mg total) by mouth 3 (three) times daily as needed.  270 tablet  1  . mexiletine (MEXITIL) 150 MG capsule TAKE 1 CAPSULE THREE TIMES A DAY  270 capsule  3  . MULTIPLE VITAMIN PO Take by mouth daily.        . Omega-3 Fatty Acids (FISH OIL) 1000 MG CAPS Take by mouth 2 (two) times daily.        . potassium chloride SA (K-DUR,KLOR-CON) 20 MEQ tablet TAKE ONE TABLET  THREE TIMES DAILY  270 tablet  3  . rosuvastatin (CRESTOR) 5 MG tablet Take 5 mg by mouth every other day.        . sotalol (BETAPACE) 120 MG tablet TAKE ONE AND ONE-HALF TABLETS TWICE A DAY  270 tablet  3  . warfarin (COUMADIN) 2.5 MG tablet Take 2.5 mg by mouth as directed.        [name] DISCONTD: isosorbide dinitrate (ISORDIL) 30 MG tablet Take 30 mg by mouth daily.        [name] DISCONTD: SOTALOL HCL PO Take 180 mg by mouth 2 (two) times daily.        [name] DISCONTD: clopidogrel (PLAVIX) 75 MG tablet Take 75 mg by mouth daily.          Allergies  Allergen Reactions  . Iohexol      Code: HIVES, Desc: hives w/ itching during cardiac cath '99, mult caths since w/ ? premeds, DR.G.HAYES REQUESTS 13 HR PRE MED//A.C.pt okay w/13 hr prep/mms,  Onset Date: [id number]   . Prednisone     Past Medical History  Diagnosis Date  . Myocardial infarction 1999    Remote anterolater and apical MI  . Cardiomyopathy, ischemic   . Chronic systolic congestive heart failure   . Ventricular tachycardia   . ICD (implantable cardiac defibrillator) in place   . 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 Abdominal aortic aneurysm 2009    Repaired with endovascular stent  . PVD (peripheral vascular disease)     Has occluded innominate vein  . Left bundle branch block   . High risk medication use     on Sotalol and Mexiletine  . CAD (coronary artery disease)     Remote PCI; s/p DES to LCX and RCA in 2005    Past Surgical History  Procedure Date  . Icd     Change out with LV lead placed with tunneling from the right  to the left side  . Endovascular stent insertion     for AAA    History  Smoking status  . Former Smoker  Smokeless tobacco  . Former Neurosurgeon  . Quit date: 06/01/1987    History  Alcohol Use No    Family History  Problem Relation Age of Onset  . Stroke Mother   . Heart attack Father     Review of Systems: The review of systems is positive for some residual soreness over his ICD sites,  especially on the right. He has not had any ICD shocks.  All other systems were reviewed and are negative.  Physical Exam: BP 120/80  Pulse 60  Wt 183 lb (83.008 kg) Patient is very pleasant and in no acute distress. Skin is warm and dry. Color is normal.  HEENT is unremarkable. Normocephalic/atraumatic. PERRL. Sclera are nonicteric. Neck is supple. No masses. No JVD. Lungs are clear. Cardiac exam shows a regular rate and rhythm. He has an occasional ectopic. ICD is on the left side. Op scar noted on the right side as well. Abdomen is soft. Extremities are without edema. Gait and ROM are intact. No gross neurologic deficits noted.   LABORATORY DATA: INR, BMET and BNP are pending   Assessment / Plan:

## 2010-10-05 NOTE — Assessment & Plan Note (Signed)
INR will be checked today. He is not having any adverse bleeding/bruising reported.

## 2010-10-05 NOTE — Patient Instructions (Signed)
Stay on your current medicines. We will check your protime today. I will have you see Dr. Patty Sermons in about 2 months.

## 2010-10-05 NOTE — Assessment & Plan Note (Signed)
No chest pain reported. We will keep him on his current medicines.

## 2010-10-06 ENCOUNTER — Telehealth: Payer: Self-pay | Admitting: *Deleted

## 2010-10-06 NOTE — Telephone Encounter (Signed)
Lab results reported.

## 2010-10-13 NOTE — Assessment & Plan Note (Signed)
OFFICE VISIT   [name], [name]  DOB:  08/03/1940                                       02/16/2010  EAVWU#:[id number]   REASON FOR VISIT:  Follow-up aneurysm.   HISTORY:  This is a 70 year old gentleman who is status post  endovascular aneurysm repair on November 24, 2007 performed by Dr. Madilyn Fireman and  myself using an Endologix graft with a proximal cuff.  The patient has  had continued  decrease in size of his aneurysm.  Most recently his ABIs  were checked to be greater than 1.  He has had no difficulties since we  last saw him.   PHYSICAL EXAMINATION:  Heart rate 81, blood pressure 115/71, O2  saturations 97%.  General:  He is well-appearing, in no distress.  Respirations are unlabored.  Abdomen:  Soft, nontender.  Groin incisions  are well-healed.  Extremities are warm and well-perfused.   I have ordered and reviewed his ultrasound which shows decrease in size  of aneurysm sac.  It now measures 3.1 x 3.2 cm.  Previously it was 3.2 x  3.6.   ASSESSMENT/PLAN:  Status post endovascular aneurysm repair.   PLAN:  The patient will continue with his yearly surveillance via  ultrasound.  I will plan on seeing him back at his next visit.     Jorge Ny, MD  Electronically Signed   VWB/MEDQ  D:  02/16/2010  T:  02/17/2010  Job:  [id number]

## 2010-10-13 NOTE — Consult Note (Signed)
VASCULAR SURGERY CONSULTATION   ANDREUS, CURE  DOB:  02/05/1941                                       04/06/2007  NWGNF#:[id number]   REFERRAL DIAGNOSIS:  A 4.3 cm abdominal aortic aneurysm.   HISTORY:  Patient is a 70 year old gentleman referred for evaluation and  management of a 4.3 cm abdominal aortic aneurysm.  This is in the  infrarenal position by ultrasound, carried out at [facility] on  March 16, 2007.  Patient denies any back pain.  He had a single  episode of right upper quadrant abdominal pain approximately six weeks  ago.  This was a fleeting episode, not associated with nausea.  It was a  sharp right upper quadrant pain, not associated with nausea or vomiting.  Not related to bowel habits.  No instigating foods.  No abdominal  bloating.   Related risk factors include hypertension, hyperlipidemia, coronary  artery disease, and family history.   PAST MEDICAL HISTORY:  1. Hypertension.  2. Hyperlipidemia.  3. Congestive heart failure.  4. Coronary artery disease, status post MI in 1989.  5. Hypothyroidism.   MEDICATIONS:  1. Xanax 0.2 mg nightly p.r.n.  2. Coreg 80 mg daily.  3. Mexiletine 150 mg b.i.d.  4. Hyzaar 10/25 daily.  5. K-Dur 20 mEq t.i.d.  6. Plavix 75 mg daily.  7. Isosorbide mononitrate ER 30 mg daily.  8. Coumadin 2.5 mg daily and 5 mg once weekly.  9. Lopressor 25 mg 2 tablets q.a.m., 1 tablet q.p.m.  10.Crestor 0.5 mg on alternate days.  11.Synthroid 0.75 mg daily.  12.Loratadine 10 mg daily.  13.Docusate sodium 1 capsule daily.  14.Aspirin 81 mg daily.  15.Multivitamin 1 tablet daily.  16.Vitamin E 500 mg daily.  17.Fish oil 1000 mg b.i.d.  18.Ranitidine 150 mg daily.   FAMILY HISTORY:  Mother deceased of coronary artery disease and  associated MI at age 48.  Father deceased, age 66, of an MI.  One sister  living at age 27 with a history of coronary artery disease and diabetes.   SOCIAL HISTORY:   Patient is married.  No children.  He is a retired  Equities trader.  Denies tobacco use.  Discontinued cigarettes in 1989.  He drinks a glass of wine three times  a week.   REVIEW OF SYSTEMS:  Refer to patient encounter form.  This was reviewed  today.  Positive findings are history of arrhythmias.  He notes a single  episode of abdominal pain, as noted above.  Arthritic joint discomfort  and occasional nervousness.  Denies weight loss, anorexia, fevers or  chills.  No chest pain.  No cough or sputum production.  Denies wheezing  or change in bowel habits.  No dysuria or frequency.  No leg pain.  No  joint discomfort.  Denies visual disturbance, sensory or motor deficit.  No change in vision, hearing, or bleeding problems.   ALLERGIES:  CONTRAST DYE.   PHYSICAL EXAMINATION:  A generally well-appearing 70 year old male in no  acute distress.  Vital signs:  BP is 125/81, pulse 62 per minute,  respirations 18 per minute, O2 sat 99%.  HEENT:  Mouth and throat are  clear.  Normocephalic.  Extraocular movements are intact.  Neck:  Supple.  No thyromegaly or adenopathy.  Cardiovascular:  No carotid  bruits.  Normal heart sounds  without murmurs.  Brachial pulses 2+  bilaterally.  Femoral pulses intact bilaterally at 2+.  Dorsalis pedis  and posterior tibial pulses are 1+ bilaterally.  No ankle edema.  Abdomen:  Soft and nontender.  No palpable AAA.  No organomegaly or  other masses felt.  Normal bowel sounds without abdominal bruits.  Musculoskeletal:  Full range of motion.  No joint deformity.  Neurologic:  Patient is alert and oriented.  Cranial nerves are intact.  Strength is equal bilaterally.  Reflexes are 2+.   INVESTIGATIONS:  Abdominal ultrasound carried out March 16, 2007 at  [facility] reveals a 4.3 cm abdominal aortic aneurysm,  unremarkable gallbladder.  Kidneys, pancreas, and spleen appear normal.  Liver is normal.   IMPRESSION:  1. A  4.3 cm asymptomatic infrarenal abdominal aortic aneurysm.  2. Coronary artery disease.  3. Congestive heart failure.  4. Hypertension.  5. Hyperlipidemia.  6. Hypothyroidism.   MEDICAL DECISION MAKING:  Patient has a small abdominal aortic aneurysm  at this time which poses little risk of rupture.  The best option at  this time is placement in a surveillance program with follow-up  ultrasound in six months.  Patient is cautioned regarding the onset of  any new abdominal pain or back pain, to contact the office immediately.  Given information regarding abdominal aortic aneurysm and potential  treatment options.   All questions answered.  To return to office in six months for abdominal  ultrasound.   Balinda Quails, M.D.  Electronically Signed  PGH/MEDQ  D:  04/06/2007  T:  04/07/2007  Job:  [id number]

## 2010-10-13 NOTE — Procedures (Signed)
ENDOVASCULAR STENT GRAFT EXAM   INDICATION:  Followup of endo stent.   HISTORY:  Repair of abdominal aortic aneurysm 11/24/2007.                           DUPLEX EVALUATION   AAA Sac Size:                 3.28 CM AP            3.63 CM TRV  Previous Sac Size:            3.84 CM AP            3.97 CM TRV  Evidence of an endoleak?      No                    No   Velocity Criteria:  Proximal Aorta                42 cm/sec  Proximal Stent Graft          79 cm/sec  Main Body Stent Graft-Mid     43 cm/sec  Right Limb-Proximal           65 cm/sec  Right Limb-Distal             69 cm/sec  Left Limb-Proximal            31 cm/sec  Left Limb-Distal              70 cm/sec  Patent Renal Arteries?        Yes                   Yes   IMPRESSION:  1. Patent endovascular stent with no evidence of stenosis or endo      leak.  2. Ankle brachial indices are within normal limits.  3. The patient is to follow up with Dr. Myra Gianotti in 6 months.   ___________________________________________  Seth Bake. Charlena Cross, MD   CJ/MEDQ  D:  07/10/2009  T:  07/10/2009  Job:  [id number]

## 2010-10-13 NOTE — Assessment & Plan Note (Signed)
[facility]                         ELECTROPHYSIOLOGY OFFICE NOTE   [name], [name]                    MRN:          [id number]  DATE:12/19/2006                            DOB:          1941/04/15    HISTORY OF PRESENT ILLNESS:  [name] continues to do very well  without recurrent ventricular tachycardia.  His exercise capacity is  quite good and he has no concern regarding that.  Apparently, he has  intercurrently been diagnosed with hypothyroidism.  His medication is  notable for the intercurrent discontinuation of his amiodarone.   MEDICATIONS:  1. Lopressor 50 in the morning, 25 at night, in conjunction with his      Coreg at 80 daily.  On this regimen, he has had no VT.  2. Ismo 30.  3. Plavix 75.  4. Hyzaar 100/25.  5. Lipitor __________ 150 b.i.d.   PHYSICAL EXAMINATION:  VITAL SIGNS:  Blood pressure 126/82, pulse 58.  LUNGS:  Clear.  HEART:  Regular.  EXTREMITIES:  Without edema.   Interrogation of his St. Jude [phone] ICD demonstrates a P wave of 3 with  impedance of 540, threshold of one volt at 0.5.  R wave was a total  impedance of 370 and threshold of 1 volt at 0.6.  Battery voltage was  2.9.  There were no intercurrent episodes.   IMPRESSION:  1. Ventricular tachycardia with previous storm  2. 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  3. Ischemic cardiomyopathy with depressed left ventricular function.  4. Previously implanted ICD.   PLAN:  [name] is doing terrific at this point.  We will plan to  see him again in 12 months time.  He will be followed remotely in the  interim.     [name], MD, [facility]  Electronically Signed    SCK/MedQ  DD: 12/19/2006  DT: 12/19/2006  Job #: [id number]   cc:   Cassell Clement, M.D.  Tera Mater. Evlyn Kanner, M.D.

## 2010-10-13 NOTE — Assessment & Plan Note (Signed)
[facility]                         ELECTROPHYSIOLOGY OFFICE NOTE   [name], [name]                    MRN:          [id number]  DATE:03/28/2007                            DOB:          Oct 04, 1940    [name] has ventricular tachycardia status post ICD implantation  in the setting of ischemic heart disease.  He is doing quite well  without intercurrent episodes.  He is tolerating his mexiletine at 150  mg twice daily well.   His other medications include:  1. Coreg 80 nightly.  2. Hyzaar 100/25.  3. Plavix 75.  4. Imdur 30.  5. Coumadin.  6. Metoprolol 25 three times a day.  7. Crestor 5.  8. Synthroid.  I should note that his TSH is back to normal.   On examination today, his blood pressure was 130/82 with a pulse of 52  and significant for flat lungs with clear heart sounds which are  regular.  Extremities were without edema.   Interrogation of his St. Jude ICD demonstrates P wave of 2 with  impedance of 550 at threshold of 1 volt at 0.5.  The R wave was 12 with  impedance of 380, threshold of 1 volt at 0.5.  Battery voltage is 2.8.  He is ventricularly paced only 2% of the time.   IMPRESSION:  1. Ventricular tachycardia.  2. Mexiletine per above.  3. Status post implantable cardioverter-defibrillator as above.  4. Previous therapy with amiodarone complicated by hyperthyroidism and      sinus tachycardia.   [name] is doing terrific.  He apparently has been diagnosed  intercurrently with an abdominal aortic aneurysm and will be seeing Dr.  Madilyn Fireman for that.   We will plan to see him again in the device clinic in 3 months' time.     [name], MD, [facility]  Electronically Signed    SCK/MedQ  DD: 03/28/2007  DT: 03/28/2007  Job #: [id number]   cc:   Balinda Quails, M.D.

## 2010-10-13 NOTE — Letter (Signed)
December 29, 2007    Cassell Clement, MD  [address], [address]  [place], [place] [address]   RE:  [name], [name]  MRN:  [id number]  /  DOB:  1941-02-13   Dear [name],   Mr. Dilauro comes in following his recent hospitalization for VT  storm that occurred in the setting of stent grafting.  He was given  prednisone and it was Dr. Lubertha Basque impression that this was related to  VT storm, somehow secondary to the steroid.  I am not sure and  understand the mechanism in any case.  He was put on sotalol, as he has  previously been intolerant of amiodarone in conjunction with mexiletine  and was subsequently discharged and has had no further VT.  He has a  little bit of fatigue, which he hopes to accommodate.   PHYSICAL EXAMINATION:  VITAL SIGNS:  His blood pressure is 134/78 and  his pulse was 59.  LUNGS:  Clear.  NECK:  Veins were flat.  HEART:  Heart sounds were regular.  ABDOMEN:  Soft.  EXTREMITIES:  Without edema.  NEUROLOGIC:  Grossly normal.   Interrogation of his St. Jude ICD demonstrated no intercurrent  therapies.  We do not have the access to electrograms from the prior  episode.   IMPRESSION:  1. Ventricular tachycardia with history of storm.  2. Recent history of storm temporarily associated with a prednisone      dose in question of mechanistic relationship.  3. Status post implantable cardioverter-defibrillator.  4. See above.  5. Inappropriate therapy for atrial arrhythmias.  6. Coronary artery disease with prior revascularization.   Mr. Crutchfield is stable at this point.  We will see him in about 4  weeks.  In the interim, I think we will continue him on his sotalol and  mexiletine at this time.  We did discuss again the possibility of  catheter ablation in the event that he has recurrent ventricular  tachycardia and this is something in which he is interested.    Sincerely,      [name], MD, [facility]  Electronically Signed    SCK/MedQ  DD:  12/29/2007  DT: 12/30/2007  Job #: [id number]   CC:    Tera Mater. Evlyn Kanner, M.D.

## 2010-10-13 NOTE — Assessment & Plan Note (Signed)
OFFICE VISIT   DEKKER, VERGA  DOB:  1940-07-16                                       10/26/2007  ZOXWR#:[id number]   The patient returned today with the results of his CT scan.  This does  reveal a 4.9 cm abdominal aortic aneurysm.  He has a 1.4 cm neck with  diameter 22 mm.  The aneurysm does terminate at the bifurcation.   The patient does appear to be a candidate for placement of an aortic  stent for his enlarging AAA.  He has an AICD and history of coronary  disease with placement of stents by Dr. Sw[place].   I spoke to Dr. Swazil[place] on the phone today in Dr. Yevonne Pax absence;  they will arrange for him to have a stress test with his upcoming visit.  Will await the results of this prior to planning placement of an aortic  stent.   Balinda Quails, M.D.  Electronically Signed   PGH/MEDQ  D:  10/26/2007  T:  10/27/2007  Job:  [id number]   cc:   Cassell Clement, M.D.  [name], MD, [facility]

## 2010-10-13 NOTE — Assessment & Plan Note (Signed)
[facility]                         ELECTROPHYSIOLOGY OFFICE NOTE   GRANT, SWAGER                    MRN:          [id number]  DATE:02/02/2007                            DOB:          1940/11/30    [name] was seen in the device clinic today because of complaints  of a shock earlier.   Interrogation of his device demonstrates no episodes of any arrhythmia  since last interrogation.  His P waves measured greater than 3 mV with  an atrial impedance of 520 ohm and a threshold of 1 volt at 0.5 ms.  His  R waves were greater than 12 mV with an RV impedance of 450 ohm and a  threshold of 1.25 volts at 0.6 ms.  This is chronic for him with a shock  impedance of 38 ohm.  His battery voltage was 2.9 volts with a charge  time of 10.5 seconds.  He was in normal sinus rhythm today.   Upon further questioning, [name] describes symptoms which sound  like PVCs.  He has had his physical blood work drawn for an appointment  with Dr. Carolee Rota PA on Tuesday, and he will bring those lab results by  the office on Tuesday to be reviewed by Dr. Graciela Husbands on Wednesday.   He also had his thyroid checked recently by Dr. Evlyn Kanner.  He is currently  on thyroid replacement therapy.   [name] is reassured today.  We will discuss things further with  Dr. Graciela Husbands once we have the results of his labs.      Gypsy Balsam, RN,BSN  Electronically Signed      [name], MD, [facility]  Electronically Signed   AS/MedQ  DD: 02/02/2007  DT: 02/02/2007  Job #: [id number]

## 2010-10-13 NOTE — H&P (Signed)
NAMEBERNARDO, [name] NO.:  [id number]   MEDICAL RECORD NO.:  [id number]          PATIENT TYPE:  INP   LOCATION:  2905                         FACILITY:  [facility]   PHYSICIAN:  Cassell Clement, M.D. DATE OF BIRTH:  1940/07/06   DATE OF ADMISSION:  12/21/2007  DATE OF DISCHARGE:                              HISTORY & PHYSICAL   CHIEF COMPLAINT:  Tachycardia and recurrent defibrillator shocks.   HISTORY:  This is a 70 year old gentleman with a known ischemic  cardiomyopathy.  He has a Administrator, arts in place since 2004.  He has a past history of having had a large anterior wall myocardial  infarction in 1991 when he was at the [employer].  He was  treated at that time with a late thrombolytic therapy back in [place],  [place].  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  In 2004 while on a vacation  with family in [place], he developed a severe ventricular tachycardia and  had a St. Jude ICD implanted there.  Upon return to [facility], he has  been followed by Dr. Graciela Husbands.  The patient had been on long-term  amiodarone as well as on mexiletine with good suppression of his VT, but  in 2007 he developed thyrotoxicosis, felt to be secondary to his  amiodarone.  During his thyrotoxicosis, the patient had repeated shocks  of his ICD because of the tachycardia associated with the  thyrotoxicosis.  Dr. Adrian Prince has been following the patient.  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  The patient had a recent stent  grafting of his abdominal aortic aneurysm by Dr. Liliane Bade on November 24, 2007.  He had a preop clearance in our office with a 1-day adenosine  Cardiolite on November 02, 2007.  The resting EKG showed first-degree block,  left anterior fascicular  block, and a left bundle.  His ejection  fraction was 24% and he had anterior, inferior, and apical akineses and  overall severe LV dysfunction, but did not have any reversibility or  ischemia and there has been no change since the previous Cardiolite of  September 06, 2001, and it was felt that he could safely proceed with the  stent grafting of his aneurysm, which took place on November 24, 2007, and  the patient did well and went home as scheduled.  The patient was to  have had a followup abdominal CT scan with contrast today, which would  be 1 month after the procedure because of a history of IVP dye allergy.  The patient was taking prednisone 50 mg at 10:00 p.m. last night and  again at 4:00 a.m. this morning took another 50 mg.  Subsequent to that,  he noted rapid heartbeat and began having defibrillator shocks  repetitively.  His wife called EMS and he was brought to the emergency  room.  In the emergency room, it was not initially  clear as to whether  he was in atrial fib with aberration versus sinus rhythm with  ventricular tachycardia.  He did not respond to bolus of Cardizem and a  brief Cardizem drip, so we switched him over to IV lidocaine bolus and  drip, which seemed to control his rhythm at which point it was more  clear that he was in normal sinus rhythm with runs of wide complex  ventricular tachycardia.   His home meds are Coumadin 5 mg, taking half a tablet 6 days a week and  whole tablet one day a week.  His last INR yesterday was 2.0.   Additional meds are:  1. Aspirin 81 mg daily.  2. Multivitamin daily.  3. Hyzaar 100/25 daily.  4. Plavix 75 mg daily.  5. Imdur 30 mg daily.  6. K-Dur 20 mEq t.i.d.  7. Mexiletine 150 mg b.i.d.  8. Xanax 0.25 mg at 5:00 p.m. and at 9:00 p.m. each night.  9. Coreg CR 80 mg daily.  10.Claritin 10 mg daily.  11.Metoprolol 25 mg 2 in the morning and 1 at night.  12.Fish oil 1000 mg b.i.d.  13.Vitamin C 500 mg daily.  14.Crestor 5 mg  every other day.  15.Synthroid 88 mcg daily.   PAST MEDICAL HISTORY:  He had cardiac catheterization in July 11, 2003, by Dr. [place] during an admission for recurrent ventricular  tachycardia and he had a stent to the mid left circumflex as well as a  stent to the proximal right coronary artery for moderate severe  stenosis.  Since then, he has been on aspirin and Plavix.  It is a  Cypher drug-eluting stent.  Both stents are Cypher stents.  The patient  has not had any recurrent angina pectoris.   SOCIAL HISTORY:  He is married, lives with his wife.  He quit smoking 25  years ago.   REVIEW OF SYSTEMS:  No history of fever or chills.  He is not having any  orthopnea or paroxysmal nocturnal dyspnea.  He is not having any  dysuria.  Remainder of review of systems negative in detail.   PHYSICAL EXAMINATION:  VITAL SIGNS:  His blood pressure is 151/92 and  pulse is 100 and irregular with runs of wide complex tachycardia.  HEENT:  Pupils are equal and reactive.  Sclerae are clear.  Extraocular  movements are full.  The mouth and pharynx normal.  Carotids normal.  Jugular venous pressure normal.  Thyroid not enlarged or tender.  CHEST:  Clear to auscultation.  HEART:  No murmur, gallop, or rub.  ABDOMEN:  Soft and nontender.  No masses.  The right groin incision is  healing well.  There is no phlebitis or edema.  Pedal pulses are  present.   Chest x-ray shows borderline cardiomegaly, but clear lungs.  Initial  labs are satisfactory including electrolytes and magnesium.   IMPRESSION:  1. Wide complex tachycardia secondary to recurrent ventricular      tachycardia.  2. Ischemic heart disease, status post old extensive anterior and      apical myocardial infarction in 1991 with severe left ventricular      dysfunction and most recent Cardiolite showing ejection fraction of      24% with akinesis of the anterior, inferior, and apical walls, but      no reversible ischemia.  3.  Status post amiodarone-induced thyrotoxicosis, presently euthyroid.  4. Recent stent grafting of abdominal aortic aneurysm by Dr. Liliane Bade  on November 24, 2007.  5. Possible adverse reaction to preoperative steroid.   DISPOSITION:  The patient was given an initial trial of IV Cardizem with  no improvement in the emergency room, then switched in the emergency  room to IV lidocaine, which showed initial partial success.  The patient  will be transferred to Coronary Care Unit for further evaluation and  treatment and Dr. Graciela Husbands and Dr. Ladona Ridgel will consult.           ______________________________  Cassell Clement, M.D.     TB/MEDQ  D:  12/21/2007  T:  12/22/2007  Job:  [id number]   cc:   Balinda Quails, M.D.  [name], MD, [facility]  Tera Mater. Evlyn Kanner, M.D.

## 2010-10-13 NOTE — Letter (Signed)
December 14, 2007    Cassell Clement, MD  [address], [address]  [place], [place] [address]   RE:  [name], [name]  MRN:  [id number]  /  DOB:  1940/09/24   Dear [name],   Mr. Dismore is seen today in followup for ventricular tachycardia in  the setting of some ischemic cardiomyopathy and depressed LV function.  He has had recurrent ventricular tachycardia and currently takes  mexiletine 150 b.i.d. for this.   He had been doing quite well.  He recently underwent stent grafting of  his abdominal aortic aneurysm.   He has no complaints of chest pain or shortness of breath.   His medications are reviewed and are notable for the intercurrent up-  titration of his Synthroid from 75 to 88.  His cardiac medications in  addition to mexiletine include Coreg 80, Hyzaar 100/25, potassium 20  t.i.d., Plavix 75, isosorbide 30, Coumadin, and metoprolol 50/25.   PHYSICAL EXAMINATION:  VITAL SIGNS:  Blood pressure 137/84 with a pulse  of 56.  LUNGS:  Clear.  HEART:  Sounds are regular.  EXTREMITIES:  Without edema.  ABDOMEN:  Soft.  NEUROLOGIC:  Grossly normal.   Interrogation of his St. Jude device demonstrated P-wave of 2 with  impedance of 513 and R-wave of 12 with impedance of 345.  There is one  episode of ATP which turned out to be related to therapy deliver for  nonsustained ventricular tachycardia because of failed to redetect sinus  with a short burst of NSVT.   IMPRESSION:  1. Ventricular tachycardia - recurrent with appropriate therapy.  2. Status post implantable cardioverter-defibrillator for the above.  3. Mexiletine the ventricular tachycardia.  4. Atrial arrhythmias with inappropriate therapies.  5. Ischemic cardiomyopathy with depressed left ventricular function.  6. Recent stent grafting.   Ms. Sarath, [place], is doing quite well from arrhythmia point of view.  The one nonsustained episode prompted [place] to reprogram his device in ways  which should minimize the  likelihood of therapy for nonsustained  arrhythmias without prolonging the detection rate and enhancing  redetection of sinus rhythm.   We will see him again in 1 year's time.  We will follow him remotely in  the interim.    Sincerely,      [name], MD, [facility]  Electronically Signed    SCK/MedQ  DD: 12/14/2007  DT: 12/15/2007  Job #: [id number]

## 2010-10-13 NOTE — Consult Note (Signed)
[name], [name] NO.:  [id number]   MEDICAL RECORD NO.:  [id number]          PATIENT TYPE:  INP   LOCATION:  2905                         FACILITY:  [facility]   PHYSICIAN:  Doylene Canning. Ladona Ridgel, MD    DATE OF BIRTH:  [birth year]   DATE OF CONSULTATION:  12/22/2007  DATE OF DISCHARGE:                                 CONSULTATION   REFERRING PHYSICIAN:  Cassell Clement, MD   INDICATIONS FOR CONSULTATION:  Evaluation of recurrent ICD shocks in a  patient with a history of ventricular tachycardia.   HISTORY OF PRESENT ILLNESS:  The patient is a very pleasant 70 year old  man who had a large anterior wall myocardial infarction in 1991, treated  with angioplasty and stenting.  He developed recurrent monomorphic VT  while visiting his son in [place] in December 2004, and underwent ICD  implantation at that time.  He developed recurrent tachycardia in 2005  and was treated with amiodarone.  Catheterization at that time  demonstrated 70% circumflex disease and 90% RCA disease, for which he  was treated with stents to both.  The patient developed thyrotoxicosis  in January 2008, and received multiple inappropriate ICD shocks  secondary to sinus tachycardia.  He recently developed an abdominal  aortic aneurysm and underwent stent grafting by Dr. Madilyn Fireman approximately  3 weeks ago.  Prior to that procedure, the patient underwent stress  Myoview study which was unremarkable for ischemia.  As a premedication  for a repeat CT scan post AAA repair, the patient underwent loading of  prednisone and subsequently developed recurrent tachy palpitations and  tachycardia.  He received at least 10 ICD discharges, all for sustained  monomorphic ventricular tachycardia at a rate of approximately 150 beats  per minute.  He did not have syncope with any of these episodes.  He is  admitted for additional evaluation.  His initial troponins were mildly  elevated.  He denies any anginal symptoms or  shortness of breath.   ADDITIONAL PAST MEDICAL HISTORY:  Notable for hypertension.   FAMILY HISTORY:  Unremarkable.   SOCIAL HISTORY:  The patient has a history of tobacco use, stopped  smoking 25 years ago.   MEDICATIONS ON ADMISSION:  1. Coreg CR 80 a day.  2. Synthroid 88 mcg a day.  3. Hyzaar 100/25 a day.  4. Potassium 20 t.i.d.  5. Imdur 30 a day.  6. Plavix 75 a day.  7. Crestor 5 a day.  8. Multiple vitamins.   REVIEW OF SYSTEMS:  All systems were reviewed with the patient and  negative except for palpitations as previously noted in the HPI.   PHYSICAL EXAMINATION:  GENERAL:  He is a pleasant well-appearing middle-  aged man, in no acute distress.  VITAL SIGNS:  Blood pressure was 140/75, pulse was 80 and regular,  respirations were 18, and temperature was 98.  HEENT:  Normocephalic and atraumatic.  Pupils equal and round.  Oropharynx is moist.  Sclerae anicteric.  NECK:  No jugular venous distention.  No thyromegaly.  Trachea is  midline.  The carotids are 2+ and symmetric.  LUNGS:  Clear bilaterally  to auscultation.  No wheezes, rales, or  rhonchi are present.  There is no increased work of breathing.  CARDIOVASCULAR:  Regular rate and rhythm.  Normal S1 and S2.  The PMI  was not enlarged, nor was it laterally displaced.  ABDOMEN:  Soft and nontender.  His stent graft site has healed nicely.  EXTREMITIES:  Demonstrated no cyanosis, clubbing, or edema.  NEUROLOGIC:  Alert and oriented x3 with cranial nerves intact.  Strength  is 5/5 and symmetric.   LABORATORY DATA:  Of note, demonstrated BNP of 570.  His INR was 2.  His  initial cardiac enzymes were mildly elevated with a troponin of 0.8.  His creatinine was 1.3.   IMPRESSION:  1. Recurrent ventricular tachycardia and with multiple implantable      cardioverter-defibrillator shocks.  2. Prior abdominal aortic aneurysm repair with stent grafting with      preprocedure stress Myoview study demonstrating no  evidence of      ischemia.  3. Recent premedication with prednisone.  4. INTRAVENOUS CONTRAST allergy.  5. Ischemic cardiomyopathy, ejection fraction of 35%, status post      large anterior myocardial infarction in the past.   DISCUSSION:  I suspect prednisone consumption has something to do with  the patient's development of tachycardia, though we cannot be for sure.  His troponin is slightly elevated though he had no anginal symptoms and  a negative stress test recently.  I am not convinced that  catheterization at this point is mandatory, particularly in light of his  paucity of symptoms and recent stent graft to the right groin and  disease in his abdominal aorta.  With regard to catheterization, I will  defer to Dr. Patty Sermons.  With regard to suppression of the patient's  ventricular tachycardia, he is not on any antiarrhythmic drug at  present, on admission, and he has been quiet a down with IV lidocaine  along with initiation of sotalol therapy.  With his amiodarone-induced  thyrotoxicosis, I think amiodarone is not a good drug for him and  sotalol at 120 twice daily is more reasonable, perhaps we can  discontinue his Coreg or at least decrease the dose.  Obviously, if he  has more VT, then catheter ablation would be a very reasonable thing, as  his VT is very well hemodynamically tolerated.  We would recommend  discontinuation of lidocaine at the present time.  He should be  hospitalized for at least 2 more days, and if he has no more VT or  problems with the sotalol, then discharge with early followup would be  reasonable.       Doylene Canning. Ladona Ridgel, MD  Electronically Signed     GWT/MEDQ  D:  12/22/2007  T:  12/22/2007  Job:  [id number]   cc:   [name], MD, [facility]  Tera Mater. Evlyn Kanner, M.D.  Cassell Clement, M.D.

## 2010-10-13 NOTE — Letter (Signed)
Oct 24, 2007    Cassell Clement, M.D.  [address], [address]  [place], [place] [address]   RE:  [name], [name]  MRN:  [id number]  /  DOB:  1941/01/05   Dear [name]:   Mr. Panico came in today at our request following a transmission  from his ICD of multiple arrhythmias identified occurring in March.  Re-  evaluation of these electrograms are consistent with him being in atrial  flutter with 2:1 conduction.   Recalling that he has had some supraventricular arrhythmias before and  that he is on Coumadin, there were no intercurrent episodes.   I should note that the defibrillator tried to terminate these, but as  you may recall, we have turned off shock therapy in his lowest zone so  as to try to prevent inappropriate shocks.  Indeed, he was unaware of  this.   He was recently treated for a dye reaction and is currently on steroids  for this.  He had tachy palpitations this morning, but his device  detected nothing.  We also recall that his lower detection rate for his  defibrillator is at 115, so whatever it was either quite slow or very  brief   CURRENT MEDICATIONS:  1. Coreg 80 a day as well as metoprolol 50/25 used for augmented rate      control.  2. Mexiletine 150 b.i.d.  3. Hyzaar.  4. Potassium.  5. Plavix.  6. Isosorbide.  7. Crestor 5 every other day.   PHYSICAL EXAMINATION:  VITAL SIGNS:  Blood pressure today was 134/84  with a pulse of 81.  LUNGS:  Clear.  HEART:  Sounds were regular.  EXTREMITIES:  Without edema.  SKIN:  Warm and dry.   IMPRESSION:  1. Ventricular tachycardia.  2. Status post implantable cardioverter-defibrillator for the above.  3. Inappropriate therapy for atrial arrhythmias.  4. Abdominal aortic aneurysm which is increasing in size.   Ms. Honea is stable from my point of view [name].  I think given the  relative infrequency of these episodes, I have chosen to do nothing for  now.  We will plan to see him again in July as  previously scheduled.    Sincerely,      [name], MD, [facility]  Electronically Signed    SCK/MedQ  DD: 10/24/2007  DT: 10/24/2007  Job #: [id number]   CC:    Balinda Quails, M.D.

## 2010-10-13 NOTE — Discharge Summary (Signed)
[name], [name] NO.:  [id number]   MEDICAL RECORD NO.:  [id number]          PATIENT TYPE:  INP   LOCATION:  2019                         FACILITY:  [facility]   PHYSICIAN:  Cassell Clement, M.D. DATE OF BIRTH:  [birth year]   DATE OF ADMISSION:  12/21/2007  DATE OF DISCHARGE:  12/24/2007                               DISCHARGE SUMMARY   FINAL DIAGNOSES:  1. Recurrent defibrillator discharges secondary to recurrent      paroxysmal ventricular tachycardia, resolved.  2. Coronary artery disease with ischemic cardiomyopathy.  3. Status post large anterior wall myocardial infarction in 1991.  4. Status post St. Jude implantable cardioverter-defibrillator      implanted in [place] on May 22, 2003, subsequently followed      here in [address] by Dr. Graciela Husbands.  5. Status post amiodarone-induced hypothyroidism, presently euthyroid      on Synthroid replacement followed by Dr. Evlyn Kanner.  6. Recent stent graft therapy for abdominal aortic aneurysm on November 24, 2007, by Dr. Liliane Bade.  7. Adverse reaction to high-dose steroids.  8. History of allergy to IVP DYE.   HISTORY:  This is a 70 year old married Caucasian gentleman who is  admitted through the Emergency Room after suffering recurrent  defibrillator shocks this morning.  He estimated that he was shocked at  least 6 times and subsequent interrogation showed that he received 10  shocks this morning between 0515 and 0555.  He had been doing well.  He  recently underwent successful stent graft surgery for an abdominal  aortic aneurysm by Dr. Liliane Bade.  He was to have had an abdominal CT  scan with contrast today and took prednisone 50 mg last night and again  at 4 o'clock on the morning of admission and subsequent to that noted  marked tachycardia with repetitive defibrillator discharges.  EMS was  called and brought him to [facility] Emergency Room.  The initial presenting  rhythm was somewhat unclear.  It was  difficult to tell as to whether  this was atrial fibrillation with aberrancy versus ventricular  fibrillation.  He was given a brief trial of IV Cardizem in the  Emergency Room with no response and then he was switched to IV lidocaine  with improvement and no further ICD shocks.   HOME MEDICATIONS:  1. Alprazolam 0.25 mg as needed for anxiety and sleep.  2. Coreg 80 mg daily.  3. Mexiletine 150 mg twice a day.  4. Hyzaar 100/25 daily.  5. K-Dur 20 mEq 3 times a day.  6. Plavix 75 mg daily.  7. Isosorbide mononitrate 30 mg daily.  8. Metoprolol 25 mg 3 daily.  9. Crestor 5 mg every other day.  10.Synthroid 88 mcg daily.  11.Coumadin 5 mg tablets taking half tablet daily except for full when      on Wednesdays.   ALLERGIES:  The patient is allergic to ZOCOR, CONTRAST MEDIA, and  AMIODARONE.   ADDITIONAL HOME MEDICINES:  1. Loratadine 10 mg daily.  2. Stool softener daily.  3. Aspirin 81 mg daily.  4. Multivitamin daily.  5. Vitamin C daily.  6. Fish oil twice a day.  7. Ranitidine 150 mg p.r.n.   PHYSICAL EXAMINATION:  VITAL SIGNS:  On admission, blood pressure was  151/92, pulse was irregular with runs of wide complex tachycardia.  HEENT:  Negative.  NECK:  Carotids normal.  No bruits.  CHEST:  Clear.  HEART:  No murmur, gallop, or rub.  ABDOMEN:  Soft and nontender.  No mass.  EXTREMITIES:  The right groin incision is healing nicely.  There is no  phlebitis or edema.  Pedal pulses are present.   LABORATORY DATA:  Chest x-ray shows mild cardiomegaly, but clear lungs.  Initial lab work was unremarkable.   HOSPITAL COURSE:  The patient was admitted to CCU.  He was treated with  IV lidocaine.  He was seen in consultation with Dr. Liliane Bade.  Dr.  Madilyn Fireman recommended the addition of sotalol for suppression of his  ventricular tachycardia.  It was felt that the prednisone consumption  had had something to do with triggering in the tachycardia, and we will  try to avoid  prednisone in the future.  The patient tolerated the  sotalol well.  His QTc intervals remained normal.  He had no further  sustained VT.  He was still having occasional PVCs and occasional 2 and  3 beats of VT, but no sustained VT at the time of discharge.  Lidocaine  was discontinued on the afternoon of December 22, 2007.   PHYSICAL EXAMINATION:  VITAL SIGNS:  On the day of discharge, his blood  pressure was 117/70, pulse was 57, and he alternates between sinus  bradycardia and atrial pacing.  LUNGS:  Clear.  HEART:  No gallop.  EXTREMITIES:  No edema.  He had tolerated ambulation well with cardiac  rehab.   The patient will be seen back in the office mid week for a followup  office visit, protime, EKG, and a nonfasting BMET.   DISCHARGE MEDICATIONS:  1. Warfarin 5 mg half tablet daily except a whole tablet Wednesday.  2. Aspirin 81 mg daily.  3. Hyzaar 100/25, 1 daily.  4. Xanax 0.25 mg twice a day and p.r.n.  5. Plavix 75 mg 1 daily.  6. Multivitamin 1 daily.  7. Imdur 30 mg each morning.  8. Potassium 20 mEq 3 times a day.  9. Mexitil 150 mg twice a day.  10.Carvedilol.  11.Coreg CR 80 once a day.  12.Claritin 10 mg daily.  13.Lovaza 1 g twice a day.  14.Crestor 5 mg every other day.  15.Synthroid 88 mcg daily.  16.Vitamin C 500 mg daily.  17.Sotalol 120 mg twice a day.  18.He is to stop Lopressor now that he is on sotalol.   ACCESSORY LABORATORY STUDIES:  Hemoglobin is 13, hematocrit 38.4, and  platelets 285,000.  Potassium 3.7, sodium 138, BUN 15, creatinine 1.03,  and blood sugar 101.  Protimes remained therapeutic range between 2.0  and 2.4.  Liver function studies normal.  Cardiac enzymes were elevated  secondary to repeated shocks with troponins of 0.79, 0.86, 0.91, and  0.71 with CK-MBs of 13, 11, and 8.  Cholesterol was 171, LDL 115, HDL  47, and triglycerides 47.  TSH was 1.7, T4 of 9.7, and B natriuretic  peptide 571 and 569.  Chest x-ray showed cardiomegaly,  but no evidence  of congestive heart failure.   CONDITION ON DISCHARGE:  Improved.            ______________________________  Cassell Clement, M.D.     TB/MEDQ  D:  12/24/2007  T:  12/24/2007  Job:  [id number]   cc:   Doylene Canning. Ladona Ridgel, MD  [name], MD, [facility]  Tera Mater. Evlyn Kanner, M.D.  Balinda Quails, M.D.

## 2010-10-13 NOTE — Letter (Signed)
February 06, 2008    Cassell Clement, M.D.  [address]  [place], [place] [address]   RE:  [name], [name]  MRN:  [id number]  /  DOB:  04/14/1941   Dear [name]:   [name] came in last week while I was away and saw [name]  because of feeling really crummy.  It turns out that he was also having  much ventricular tachycardia.  [name] discontinued his Lopressor and up-  titrated his sotalol from 120 to 180.  Since September 2, things have  been quiescent, and he feels much better.   Apparently his potassium and magnesium had been checked recently and  they were okay.  His other medications include mexiletine 150 b.i.d.,  Coreg 12.5 b.i.d., Xanax, Imdur 30, warfarin, Synthroid, potassium and  Plavix.   On examination, his blood pressure today was 143/87 with a pulse of 58.  The neck veins were flat.  The lungs were clear.  Heart sounds were  regular.  The abdomen was soft with vague active bowel sounds.  The  extremities were without edema.   Electrocardiogram dated today demonstrated sinus rhythm at 57 with  intervals of 0.30/0.11/0.42.   IMPRESSION:  1. Recurrent ventricular tachycardia.  Currently on sotalol,      mexiletine.  2. Recent storm, temporally related to prednisone therapy      intravenously. 3.  Status post implantable cardioverter      defibrillator.  3. Ischemic heart disease with prior revascularization.   [name], [name], is tolerating the increased dose of sotalol and is  actually feeling better off the Lopressor.   We will plan to see him again in 4 to 6 weeks.  We talked again about  the possibility of catheter ablation, and at this point as long as he is  feeling well he would like to continue on his medical therapy.  This  sits like the Sword of Damocles, however, as he has had recurrent  problems with VT in the past.   We will see him again as I noted in about 4 weeks.    Sincerely,      [name], MD,  [facility]  Electronically Signed    SCK/MedQ  DD: 02/06/2008  DT: 02/06/2008  Job #: [id number]   CC:    Tera Mater. Evlyn Kanner, M.D.

## 2010-10-13 NOTE — Assessment & Plan Note (Signed)
[facility]                         ELECTROPHYSIOLOGY OFFICE NOTE   TERRELLE, RUFFOLO                    MRN:          [id number]  DATE:01/29/2008                            DOB:          04-29-1941    [name] returns today for followup.  He is a patient of Dr.  Koren Bound and Dr. Jenness Corner, who was subsequently found to have recurrent  episodes of VT for which he was terminated with antitachycardic pacing.  He returns today for followup.  The patient notes that since beginning  the sotalol, he has experienced palpitations typically between 6 and 8  o'clock in the evening just prior to his second dose of sotalol for the  day.  This occurs almost every day, though some days are better than  others.  He was seen by Dr. Patty Sermons and had metoprolol 25 b.i.d.  reinitiated.  He returns today for followup.  In addition, to the  palpitations, he notes severe fatigue, weakness and lack of energy.  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   CURRENT MEDICATIONS:  1. Sotalol 120 mg twice daily.  2. Coreg CR recommended by Dr. Patty Sermons 80 mg daily.  3. Metoprolol 25 b.i.d.  4. Synthroid 88 mcg daily.  5. Mexiletine 150 mg twice daily.  6. Hyzaar 100/25 daily.  7. Potassium 20 three times daily.  8. Plavix 75 a day.  9. Isosorbide 30 a day.  10.Coumadin as directed.  11.Crestor 5 a day.  12.Multiple vitamins.  13.Fish oil.  14.He is also on aspirin 81 mg daily.   PHYSICAL EXAMINATION:  GENERAL:  He is a pleasant man in no acute  distress.  VITAL SIGNS:  Blood pressure was 122/78.  The pulse was 60 and regular.  The respirations were 18.  Weight was 171 pounds.  NECK:  Revealed no jugular venous distention.  LUNGS:  Clear bilaterally to auscultation.  No wheezes, rales, or  rhonchi are present.  There is no increased work  of breathing.  CARDIOVASCULAR:  Revealed a regular rate and rhythm.  Normal S1 and S2.  EXTREMITIES:  Demonstrated no edema.   Interrogation of his defibrillator demonstrates a St. Jude Atlas V-242  implanted in December 2004.  There were 14 episodes of VT, which 12 were  in the last 2 days, all successfully terminated with antitachycardic  pacing.  Essentially all of them took place between 6 and 9 o'clock in  the evening.   IMPRESSION:  1. Recurrent ventricular tachycardia.  All terminated with      antitachycardic pacing.  2. Chronic sotalol therapy to 120 mg twice daily.  3. Longstanding ischemic cardiomyopathy with ejection fraction of 35%.  4. History of abdominal aortic aneurysm, status post stent grafting.  5. Severe fatigue and weakness, thought related to beta-blocker      therapy.   DISCUSSION:  Mr. Labonte, I think needs more antiarrhythmic therapy  and perhaps less beta-blocker therapy to help reduce his VT and  symptomatic PVCs and reduce  his severe fatigue.  To this end, I have  increased his sotalol from 120 twice a day to 180 mg twice daily.  I  have asked that he stopped his Lopressor altogether and I have asked  that he decrease his Coreg CR from 80 mg to carvedilol 12.5 mg twice  daily.  This will be the equivalent dose to Coreg CR 40 a day.  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  The patient is  scheduled to see Dr. Graciela Husbands in the office in a week and at that time we  will obtain a 12-lead EKG to make sure his QT interval has not prolonged  too much.  Hopefully, his fatigue will be better at that point.  Ultimately, the VT ablation would be a consideration as his VT is fairly  slow and would be likely mappable with [employer].     Doylene Canning. Ladona Ridgel, MD  Electronically Signed    GWT/MedQ  DD: 01/29/2008  DT: 01/30/2008  Job #: [id number]   cc:   Cassell Clement, M.D.

## 2010-10-13 NOTE — Procedures (Signed)
VASCULAR LAB EXAM   INDICATION:  Followup abdominal aortic aneurysm endograft placed  11/24/2007.   HISTORY:  Diabetes:  No.  Cardiac:  Yes.  MI in 1980.  Hypertension:  Yes.   EXAM:  AAA sac size 3.7 cm AP, 3.28 cm transverse.   IMPRESSION:  1. The aorta and endograft appear patent.  2. No significant change in size of the aneurysmal sac surrounding the      endograft.  3. No evidence of endo leak was detected.   PREVIOUS SAC SIZE:  07/10/2009, 3.28 cm AP, 3.63 cm transverse.   ___________________________________________  V. Charlena Cross, MD   EM/MEDQ  D:  02/16/2010  T:  02/16/2010  Job:  [id number]

## 2010-10-13 NOTE — Assessment & Plan Note (Signed)
[facility]                         ELECTROPHYSIOLOGY OFFICE NOTE   BENSEN, CHADDERDON                    MRN:          [id number]  DATE:02/27/2008                            DOB:          12/20/1940    Mr. Falkner comes in after a 3-week hiatus with review of his  defibrillator demonstrated no intercurrent ventricular tachycardia.  He  is feeling somewhat better off of metoprolol and on sotalol and Coreg.   His wife though, states his exercise performance is really far less than  it had been.   His current medications include Coreg 12.5 b.i.d., mexiletine 150 b.i.d.  sotalol 180 b.i.d., warfarin, Synthroid 88 mcg, Crestor 5, aspirin 81,  and Xanax.   On examination, his blood pressure today was mildly elevated at 146/79  and his pulse was 53.  His lungs were clear.  His heart sounds were  regular.  Extremities were without edema.   Device was as noted above.   IMPRESSION:  1. Recurrent ventricular tachycardia currently quiescent on sotalol,      mexiletine, and Coreg.  2. Recent ventricular tachycardia storm.  3. Status post implantable cardioverter-defibrillator for the above.  4. Ischemic heart disease with prior revascularization.  5. No angiotensin-converting enzyme inhibitor therapy.   I have taken the liberty of having Mr. Dudas meet with Dr. Hillis Range concerning VT ablation.  We discussed the role of VT ablation as  an alternative to medical therapy and we further discussed whether he  would like to see somebody about VT ablation in [place] or be  referred to one of the medical centers.  It was his preference to sit  down and talk with Dr. Johney Frame about ablation for VT.   In the event that is what they choose to do, we plan to schedule a time  where the two of [place] would be doing it together.   I will plan to see him again in 3 months' time.     [name], MD, [facility]  Electronically Signed    SCK/MedQ   DD: 02/27/2008  DT: 02/28/2008  Job #: [id number]   cc:   Cassell Clement, M.D.  Tera Mater. Evlyn Kanner, M.D.

## 2010-10-13 NOTE — Op Note (Signed)
NAMEMarland [name]  DEMORIO, SEELEY NO.:  [id number]   MEDICAL RECORD NO.:  [id number]           PATIENT TYPE:   LOCATION:                                 FACILITY:   PHYSICIAN:  Balinda Quails, M.D.    DATE OF BIRTH:  1941-04-23   DATE OF PROCEDURE:  11/24/2007  DATE OF DISCHARGE:                               OPERATIVE REPORT   SURGEON:  Balinda Quails, MD   ASSISTANT:  1. Durene Cal IV, MD   ANESTHETIC:  General endotracheal.   PREOPERATIVE DIAGNOSIS:  A 4.9-cm infrarenal abdominal aortic aneurysm.   POSTOPERATIVE DIAGNOSIS:  A 4.9-cm infrarenal abdominal aortic aneurysm.   PROCEDURE:  Endovascular aneurysm repair with Endologix Powerlink stent  graft (28 x 40 x 100, proximal cuff 28 x 75).   OPERATIVE PROCEDURE:  The patient brought to the operating room in  stable condition.  Placed in a supine position.  General endotracheal  anesthesia induced.  Foley catheter arterial line and central venous  catheter in place.   In the supine position, the abdomen was prepped and draped in a sterile  fashion.  An oblique skin incision was made in the ipsilateral right  groin.  Dissection carried through the subcutaneous tissue with  electrocautery.  Deep dissection carried down to expose the right common  femoral artery.  Right common femoral artery dissection carried up to  the inguinal ligament where it was encircled with vessel loop.  Distal  dissection carried down to the origin of the profunda and superficial  femoral artery, which were each encircled with vessel loops.   The patient administered 7000 units of heparin intravenously.  Adequate  circulation time permitted.   The right common femoral artery was then accessed with an 18-gauge  needle and 0.035 J-wire advanced through the needle to mid abdominal  aorta.  An 8-French sheath was then advanced over the guidewire.  This  was then advanced and the dilator removed.  An Omniflush catheter then  inserted and an  exchange made for an Amplatz Super Stiff Guidewire.   The contralateral groin was accessed percutaneously with an 18-gauge  needle.  A 0.035 J-wire passed through the needle into the mid abdominal  aorta.  A 9-French sheath advanced over the guidewire.   The dilator removed, sheath flushed with heparin saline solution.   The sheath then removed from the right ipsilateral groin.  The main body  of the device 28 x 100 x 40 was then advanced over the Amplatz Super  Stiff Guidewire and up into the abdominal aorta.  The contralateral limb  wire was then grasped with a snare from the contralateral limb and the  contralateral limb was brought down into the left common iliac artery.  The device then brought down onto the aortic bifurcation and deployed.  At completion of deployment of the device, the delivery catheter removed  and a proximal cuff 28 x 75 was advanced over the guidewire and placed  in immediate infrarenal position with contrast injection through the  Omniflush catheter in the contralateral groin.  This was then placed in  the immediate infrarenal  position and deployed down into the main body  of the graft.  The delivery catheter then removed and a 12-French sheath  advanced over the ipsilateral groin.  The coated balloon was then  advanced over the guidewire and inflated at the junction and proximal  margin of the cuff and brought down into the iliac artery inflated in  the right common iliac artery, then inflated into the contralateral  limb.   A completion arteriogram revealed no evidence of leak.  Excellent  closure of the aneurysm sac.   The left groin was then controlled with a StarClose device and pressure.  The right groin catheters were removed.  The transverse arteriotomy  closed with running 6-0 Prolene suture.  The subcutaneous tissue closed  with running 3-0 Vicryl suture in 2 layers and skin closed with 4-0  Monocryl.  Dermabond applied.   The patient tolerated  the procedure well.  No apparent complications.  Received 50 mg of protamine intravenously.      Balinda Quails, M.D.  Electronically Signed     PGH/MEDQ  D:  07/12/2008  T:  07/12/2008  Job:  [id number]

## 2010-10-13 NOTE — Letter (Signed)
May 17, 2008    [name] Clement, MD  [address], [address]  [place], [place] [address]   RE:  [name], [name]  MRN:  [id number]  /  DOB:  1941/02/06   Dear Harrietta Guardian Christmas to you.   [name] comes in followup for his ventricular tachycardia.  This  has been relatively quiescent over the last couple of months on a  combination of sotalol and mexiletine.  After his last discussion with  you which followed discussions with [name], the decision has been  made to leave things as they are and hope that things remain quiet.  In  the event that he has recurrences, I think from our perspective he would  be a candidate for catheter ablation.   He does have coronary artery disease with remote intervention.  I do not  know if he has had catheterization recently.  He also has AAA status  post stent grafting, amiodarone for hyperthyroidism precluding its  further use.   His medications currently include:  1. Xanax 0.25.  2. Coreg 12.5 b.i.d.  3. Mexiletine 150 t.i.d.  4. Potassium 20 t.i.d.  5. Plavix 75.  6. Imdur 30.  7. Warfarin 2.5.  8. Crestor 5.  9. Sotalol 180 b.i.d.  10.Synthroid 88 mcg.  11.Aspirin.   On examination, his blood pressure today was 138/78, the pulse was 76,  weight was 174 which is stable.  The lungs were clear.  Neck veins were  flat.  Heart sounds were regular.  The extremities were without edema.   Interrogation of his defibrillator demonstrate no intercurrent episodes  of ventricular tachycardia.  His R-wave was 12 with impedance of 380,  threshold 1 V at 0.6.  The P-wave was 3 with impedance of 560, threshold  of 1 V at 0.5.  Estimated longevity 2 years or so.   IMPRESSION:  1. Ischemic cardiomyopathy with:      a.     Prior myocardial infarction.      b.     Remote percutaneous coronary intervention.      c.     Depressed left ventricular function.  2. Ventricular tachycardia - recurrent.  3. Amiodarone - hyperthyroidism  - discontinued for sotalol and      mexiletine now for ventricular tachycardia.   [name] is stable.  We will plan to see him again in 2 months'  time at [facility], and I will see him in 4 months.     Sincerely,      [name], MD, [facility]  Electronically Signed    SCK/MedQ  DD: 05/17/2008  DT: 05/18/2008  Job #: [id number]

## 2010-10-13 NOTE — Letter (Signed)
March 08, 2008    Cassell Clement, M.D.  [address], [address]  [place], [place] [address]   RE:  [name], [name]  MRN:  [id number]  /  DOB:  Sep 29, 1940   Dear [name],   It  was my pleasure to see [name] today in Electrophysiology  Clinic regarding therapeutic strategies for his ventricular tachycardia.  As you recall, [name] is a very pleasant 70 year old gentleman  with an ischemic cardiomyopathy, New York Heart Association class II  symptoms and recurrent ventricular tachycardia.  He reports having 2  prior episodes of VT storm.  He says that [name] first episode occurred a  couple years ago in [name] setting of amiodarone-induced thyroid toxicosis.  At that time, he received multiple ICD shocks.  His amiodarone was  discontinued and a medical therapy was instituted for thyroid disease.  He subsequently underwent endovascular repair of an aortic aneurysm in  July 2009.  He says that on December 21, 2007, he received prednisone and  perforation for CT scan.  Immediately after taking his second dose of  prednisone, he reports initiation of heart racing.  He subsequently  developed a second episode of ventricular tachycardia storm and received  10 ICD shocks.  He was admitted to [facility] for further care.  Subsequently, his metoprolol has been discontinued and [name] is  presently treated with sotalol 180 mg twice daily.  He reports no  further ICD shocks since last being evaluated in [name] hospital.  He  recently saw Dr. Sherryl Manges who felt that [name] might be an  adequate candidate for ventricular tachycardia ablation.   PROBLEM LIST:  1. Coronary artery disease status post anterior myocardial infarction      in 1991, he had TPA and PTCA at that time.  2. Ischemic cardiomyopathy (ejection fraction 24%).  3. Ischemic ventricular tachycardia.  4. Status post implantation of a [employer]. Jude [employer] ICD in 2004.  5. Abdominal aortic aneurysm status post  stent grafting on November 24, 2007.  6. Amiodarone-induced hyperthyroidism.  7. Hypertension.  8. Hyperlipidemia.  9. Hypothyroidism.   PHYSICAL EXAMINATION:  VITAL SIGNS:  Blood pressure 148/80, heart rate  80, respirations 18, and weight 172 pounds.  GENERAL:  [name] is well-appearing male, in no acute distress.  He  is alert and oriented x3.  HEENT:  Normocephalic, atraumatic.  Sclerae clear.  Conjunctivae pink.  Oropharynx clear.  NECK:  Supple.  No JVD, lymphadenopathy, or bruits.  LUNGS:  Clear to auscultation bilaterally.  HEART:  Regular rate and rhythm.  No murmurs, rubs, or gallops.  GI:  Soft, nontender, nondistended.  Positive bowel sounds.  EXTREMITIES:  No clubbing or cyanosis.  Trace lower extremity edema.  [name] has a well-healed scar over his right groin with no  appreciable bruits bilaterally.  NEUROLOGIC:  Nonfocal.   IMPRESSION:  [name] is a pleasant 70 year old gentleman with a  history of coronary artery disease, status post large anterior  myocardial infarction remotely with an ejection fraction of 24% by  recent Myoview who presents for further discussion of ischemic  ventricular tachycardia.  [name] is currently doing quite well with  sotalol therapy.  Has had 2 prior episodes of ventricular tachycardia  storm.  He has had no further episodes of ventricular tachycardia since  his recent hospital discharge.   PLAN:  Therapeutic strategies for ventricular tachycardia including with  medicine and catheter-based therapies were discussed in  detail with [name]  [name] today.  [name] risks, benefits and alternatives to EP study and  radiofrequency ablation for ventricular tachycardia were also discussed  in detail with [name] today.  He is aware of [name] risk include but  are not limited to stroke, vascular damage, bleeding, pericardial  effusion, AV block with need for pacemaker and aortic valvular damage.  [name] understands these  risks.  He would like to continue to  contemplate [name] option of ventricular tachycardia ablation.  [name], he  would like to discuss this with you as well upon followup with you in  clinic in several weeks.  I think that he would be a reasonable  candidate for ventricular tachycardia ablation.  Certainly, he seems to  be doing well at [name] present time with sotalol therapy.  Should he wish  to proceed with ventricular tachycardia  ablation, I think it would be reasonable to proceed accordingly.  He  will continue his routine device followup in our office and also to  receive his cardiology care from [name] you.  Thank you for [name] opportunity  of participating in [name] care of [facility].    Sincerely,      Hillis Range, MD  Electronically Signed    JA/MedQ  DD: 03/08/2008  DT: 03/09/2008  Job #: [id number]   CC:    Rollene Rotunda, MD, [facility]

## 2010-10-13 NOTE — Procedures (Signed)
ENDOVASCULAR STENT GRAFT EXAM   INDICATION:  Followup endovascular stent repair of abdominal aortic  aneurysm.   HISTORY:  Endostent repair of abdominal aortic aneurysm on 11/24/2007.                           DUPLEX EVALUATION   AAA Sac Size:                 4.0 CM AP             4.3 CM TRV  Previous Sac Size:            4.3 CM AP             4.5 CM TRV  Evidence of an endoleak?      No   Velocity Criteria:  Proximal Aorta                86 cm/sec  Proximal Stent Graft          50 cm/sec  Main Body Stent Graft-Mid     33 cm/sec  Right Limb-Proximal           70 cm/sec  Right Limb-Distal             43 cm/sec  Left Limb-Proximal            59 cm/sec  Left Limb-Distal              71 cm/sec  Patent Renal Arteries?        Yes   IMPRESSION:  1. Patent endostent repair of abdominal aortic aneurysm with no      evidence of stenosis or leak.  2. Stable maximum diameter of the distal abdominal aorta.  3. The bilateral ankle brachial indices are 1.13 which are stable from      the previous exam on 01/04/2008.   ___________________________________________  P. Liliane Bade, M.D.   CH/MEDQ  D:  07/05/2008  T:  07/05/2008  Job:  [id number]

## 2010-10-13 NOTE — Procedures (Signed)
DUPLEX ULTRASOUND OF ABDOMINAL AORTA   INDICATION:  Followup evaluation of known abdominal aortic aneurysm.   HISTORY:  Diabetes:  No.  Cardiac:  The patient has an implanted defibrillator and has a history  of MI in 1980.  Hypertension:  Medically controlled.  Smoking:  Former smoker.  Connective Tissue Disorder:  Family History:  The patient's cousin has an abdominal aortic aneurysm.  Previous Surgery:  No.   DUPLEX EXAM:         AP (cm)                   TRANSVERSE (cm)  Proximal             4.0 cm                    4.5 cm  Mid                  4.4 cm                    4.9 cm  Distal               3.9 cm                    4.5 cm  Right Iliac          1.3 cm                    1.3 cm  Left Iliac           1.1 cm                    1.2 cm   PREVIOUS:  Date:  03/16/2007  AP:  4.3  TRANSVERSE:   IMPRESSION:  Abdominal aortic aneurysm is slightly larger than  previously recorded.   ___________________________________________  P. Liliane Bade, M.D.   MC/MEDQ  D:  10/05/2007  T:  10/06/2007  Job:  [id number]

## 2010-10-13 NOTE — Assessment & Plan Note (Signed)
OFFICE VISIT   AJ, CRUNKLETON  DOB:  05-05-1941                                       01/04/2008  AVWUJ#:[id number]   The patient underwent placement of AAA stent graft on 11/24/2007.  Since  his procedure he has been readmitted to the hospital with a  defibrillator malfunction.  He presents at this time for postoperative  evaluation.   Lower extremity Dopplers are normal with ABIs greater than 1.0.  An  ultrasound of the abdomen carried out reveals no evidence of endoleak  with normal stent function.   PHYSICAL EXAMINATION:  General:  On examination today the patient  appears well.  Vital signs:  BP is 140/82, pulse 56 per minute,  respirations are 18 per minute.  He is alert and oriented, in no acute  distress.  Abdomen:  Soft, nontender.  No masses or organomegaly.  Right  groin incision well-healed.  Left groin reveals 2+ femoral pulse.   The patient continues to do well following his recent stent graft  procedure.  We will plan followup again in 6 months with an ultrasound  of his AAA stent.   Balinda Quails, M.D.  Electronically Signed   PGH/MEDQ  D:  01/04/2008  T:  01/05/2008  Job:  [id number]   cc:   Cassell Clement, M.D.  [name], MD, [facility]  Soyla Murphy. Renne Crigler, M.D.

## 2010-10-13 NOTE — Procedures (Signed)
ENDOVASCULAR STENT GRAFT EXAM   INDICATION:  Followup evaluation of endovascular stent.   HISTORY:  Endovascular stent repair of abdominal aortic aneurysm on 11/24/2007.                           DUPLEX EVALUATION   AAA Sac Size:                 3.84 CM AP            3.97 CM TRV  Previous Sac Size:            4.0 CM AP             4.3 CM TRV  Evidence of an endoleak?      No                    No   Velocity Criteria:  Proximal Aorta                74 cm/sec  Proximal Stent Graft          61 cm/sec  Main Body Stent Graft-Mid     36 cm/sec  Right Limb-Proximal           61 cm/sec  Right Limb-Distal             92 cm/sec  Left Limb-Proximal            83 cm/sec  Left Limb-Distal              109 cm/sec  Patent Renal Arteries?        Yes                   Yes   IMPRESSION:  1. Patent endovascular stent with no evidence of stenosis or endo      leak.  2. Normal bilateral lower extremity ABIs with triphasic Doppler      waveforms.   ___________________________________________  P. Liliane Bade, M.D.   AC/MEDQ  D:  01/02/2009  T:  01/02/2009  Job:  [id number]

## 2010-10-13 NOTE — Procedures (Signed)
ENDOVASCULAR STENT GRAFT EXAM   INDICATION:  Endostent repair of abdominal aortic aneurysm.   HISTORY:                           DUPLEX EVALUATION   AAA Sac Size:                 4.3 CM AP             4.5 CM TRV  Previous Sac Size:            4.4 CM AP             4.9 CM TRV  Evidence of an endoleak?      No                    No   Velocity Criteria:  Proximal Aorta                61 cm/sec  Proximal Stent Graft          61 cm/sec  Main Body Stent Graft-Mid     34 cm/sec  Right Limb-Proximal           76 cm/sec  Right Limb-Distal             61 cm/sec  Left Limb-Proximal            68 cm/sec  Left Limb-Distal              67 cm/sec  Patent Renal Arteries?        Yes                   Yes   IMPRESSION:  Patent endostent repair of abdominal aortic aneurysm with  no evidence of stenosis or leakage noted, based on limited visualization  due to technical limitations.   ___________________________________________  P. Liliane Bade, M.D.   CH/MEDQ  D:  01/04/2008  T:  01/04/2008  Job:  [id number]

## 2010-10-13 NOTE — Assessment & Plan Note (Signed)
OFFICE VISIT   [name], [name]  DOB:  08/28/1940                                       10/05/2007  ONGEX#:[id number]   The patient was initially seen in November of last year with a 4.3-cm  abdominal aortic aneurysm.  He underwent a repeat ultrasound at this  time which reveals enlargement of his aneurysm to 4.9 cm.  Fortunately,  he has been free of any abdominal pain or back pain.   His past history reveals hypertension, hyperlipidemia, congestive heart  failure, coronary artery disease, hypothyroidism and AICD placement.   On evaluation today, he appears generally well.  Alert and oriented.  No  acute distress.  BP 129/81, pulse 58 per minute and regular,  respirations 16 per minute.  Abdomen reveals no tenderness.  AAA  palpable.  No organomegaly other masses.  2+ femoral pulses bilaterally.  Intact posterior tibial pulses bilaterally.  No ankle edema.   The patient does show significant enlargement of his aneurysm at 4.9 cm  with a 6 mm growth over the past 6 months.  I have arranged for him to  undergo a CT angiogram of the abdomen and pelvis and return to see me  with these results for further discussion regarding aneurysm management.   Balinda Quails, M.D.  Electronically Signed   PGH/MEDQ  D:  10/05/2007  T:  10/06/2007  Job:  [id number]   cc:   Cassell Clement, M.D.

## 2010-10-16 NOTE — H&P (Signed)
NAME:  [name], [name] NO.:  [id number]   MEDICAL RECORD NO.:  [id number]                   PATIENT TYPE:  OIB   LOCATION:                                       FACILITY:  [facility]   PHYSICIAN:  Peter M. Swaziland, M.D.               DATE OF BIRTH:  11/29/1940   DATE OF ADMISSION:  07/09/2003  DATE OF DISCHARGE:                                HISTORY & PHYSICAL   HISTORY OF PRESENT ILLNESS:  Mr. Mccleery is a 70 year old white male with  a history of remote anterior myocardial infarction in 1991.  He was treated  at that time with TPA and subsequent angioplasty to LAD.  He underwent  followup angiography in 1996 which showed a 50% mid-LAD stenosis, otherwise,  nonobstructive coronary disease.  His ejection fraction at that time was  29%.  He has been treated medically since then.  He had a Cardiolite study  in April, 2003 which showed extensive apical septal and inferior wall scar  with ejection fraction of 23%.  Approximately two months ago, the patient  was visiting family in [place] when he developed sustained ventricular  tachycardia requiring defibrillation.  He had an ICD implanted at that time.  Since his implant, he has had four more episodes of ventricular tachycardia,  three of which were pace terminated and one which required defibrillation.  Because of his recurrent ventricular tachycardia, cardiac catheterization  has been recommended to rule out an ischemic trigger.  He apparently did  have a Cardiolite study while in [place] which showed extensive scar, but no  definite ischemia.  The patient denies any symptoms of chest pain or  shortness of breath and has had no syncope.   PAST MEDICAL HISTORY:  1. Anterior myocardial infarction, 1991.  2. Ischemic left ventricular dysfunction.  3. Hypercholesterolemia.  4. Ventricular tachycardia.  5. Remote history of arterial embolus to his right foot.  6. Arthritis.  7. Status post tonsillectomy and  adenoidectomy.   ALLERGIES:  IVP DYE, ZOCOR.   MEDICATIONS:  1. Coumadin 5 mg, five days per week and none on the other two days.  2. Aspirin 81 mg a day.  3. Multivitamin daily.  4. Coreg 25 mg p.o. b.i.d.  5. Hyzaar 50 mg/12.5 mg daily.  6. Lipitor 10 mg per day.   SOCIAL HISTORY:  The patient is married and has two children.  He is  retired.  He quit smoking in 1990.  He does drink occasional alcohol.   FAMILY HISTORY:  Father died at age 59 with myocardial infraction.  Mother  died at age 4 with heart disease.  One sister has hypertension, otherwise,  is alive and well.   REVIEW OF SYSTEMS:  He has had some capsulitis in his right foot, treated by  a podiatrist.  He otherwise, feels well.  Appetite has been good.  He has no  increased edema or orthopnea.  He has had no change in bowel or bladder  habits.  All other review of systems are negative.   PHYSICAL EXAMINATION:  GENERAL:  A pleasant white male in no apparent  distress.  Weight 181.  VITAL SIGNS:  Blood pressure 130/80, pulse 60 and regular.  HEENT:  Pupils are equal, round and reactive.  Conjunctivae are clear.  Oropharynx is clear.  NECK:  Supple, without JVD, adenopathy, thyromegaly or bruits.  LUNGS:  Clear to auscultation and percussion.  CARDIAC:  Reveals a regular rate and rhythm without gallop, murmur, rub or  click.  ABDOMEN:  Soft and nontender, without masses or hepatosplenomegaly.  EXTREMITIES:  Femoral and pedal pulses are palpable.  He has no lower  extremity edema or phlebitis.  SKIN:  Warm and dry.  NEUROLOGIC:  Intact.   STUDIES:  ECG shows sinus bradycardia with PVC's and extensive old anterior  infarction. Chest x-ray shows no active disease with ICD in place.   IMPRESSION:  1. Recurrent ventricular tachycardia.  2. Old anterior myocardial infarction.  3. Ischemic cardiomyopathy.  4. IVP DYE allergy.  5. Hypercholesterolemia.   PLAN:  Will hold his Coumadin for cardiac catheterization.   He will be  pretreated with oral prednisone and Benadryl.  Will proceed with cardiac  catheterization and possible intervention.   LABORATORY DATA:                                                Peter M. Swaziland, M.D.    PMJ/MEDQ  D:  07/04/2003  T:  07/04/2003  Job:  [id number]   cc:   Cassell Clement, M.D.  [address]  [place]  [place] [address]  Fax: [phone]   [name], M.D.

## 2010-10-16 NOTE — Assessment & Plan Note (Signed)
[facility]                           ELECTROPHYSIOLOGY OFFICE NOTE   SHIVANSH, HARDAWAY                    MRN:          [id number]  DATE:02/08/2006                            DOB:          1941-04-20    HISTORY OF PRESENT ILLNESS:  Mr. Hirota is seen.  He has a history of  sustained monomorphic ventricular tachycardia.  He is status post ICD  implantation.  He continues to take and tolerate Amiodarone and mexiletine  in combination for his ventricular tachycardia.  He is currently taking  Amiodarone five days a week and mexiletine at 150 mg b.i.d.   PHYSICAL EXAMINATION:  GENERAL:  He describes an episode a couple of weeks  ago where his heart for about 10 or 15 minutes went at about 70 beats per  minute.  VITAL SIGNS:  His blood pressure is 136/86, pulse is 50.  LUNGS:  Clear.  HEART:  Regular.  EXTREMITIES:  Without edema.   Interrogation of his [employer]. [employer] Atlas device demonstrates a R-wave of 3 with  impedence of 550, threshold of 0.75 and 0.5.  The R-wave is greater than 12  with impedence of 390, threshold at 1V at 0.5.  There were no intra-current  episodes in either the atrial category or the ventricular category.  He is  100% ventricular paced.   IMPRESSION:  1. Ventricular tachycardia - recurrent.  2. Status post ICD for the above.  3. Amiodarone and mexiletine for drug suppression.  4. Ischemic heart disease with prior percutaneous coronary intervention      and a depressed left ventricular function with an ejection fraction a      couple of years ago of 25%.   Mr. Alvizo is doing remarkably well.  I am thrilled for him.  We will  plan to see him again in 12 months time, and we will monitor his device  remotely via house call in the interim.                                   [name], MD, [facility]   SCK/MedQ  DD:  02/08/2006  DT:  02/08/2006  Job #:  [id number]   cc:   Cassell Clement, M.D.

## 2010-10-16 NOTE — Letter (Signed)
July 13, 2006    Cassell Clement, M.D.  [address]  [place], [place] [address]   RE:  [name], [name]  MRN:  [id number]  /  DOB:  01-Feb-1941   Dear [name],   [name] came in today for his treadmill, following  inappropriate shock for sinus tachycardia.  His Lopressor has been  initiated.  He is feeling a little bit sluggish.  He is not sure that it  is worse since his Lopressor was started.   He was admitted for modification of the Bruce protocol and was  terminated when his heart rate got to 104.  On reviewing his chart  relatively extensively, I recall that he initially presented in December  2004, in [place], at which time his defibrillator was implanted.  Amiodarone was started.  It was stopped sometime later, and then in  February 2005, he ended coming to the hospital, undergoing  revascularization.  He had ventricular tachycardia and amiodarone was  reinitiated.  Those VTs are about 400 msec.  The next VT that I see are  about 510 msec, admitted in September 2005.  Potentially the amiodarone  contributed to the VT slowing.   With his hyperthyroidism currently being treated by Dr. Tera Mater.  [place], this may have aggravated his tendency to sinus tachycardia, but  treadmill testing to date demonstrates that he goes up to about 103 or  104, and this was terminated before fatigue.  What this tells [place] is that  there is still some potential for overlap, to the degree that the VT  rates will still be as low as they are in the context of amiodarone  withdrawal and subsequent elimination.   What I have done is that I have programmed the defibrillator today to  monitor him from 110-125 and therapy above 125.   I would like to see him again in about four weeks, and we will review  the monitor zone and see if we can activate the device in this rate  range.    Sincerely,      [name], MD, [facility]  Electronically Signed    SCK/MedQ  DD:  07/13/2006  DT: 07/13/2006  Job #: [id number]

## 2010-10-16 NOTE — Discharge Summary (Signed)
NAMEMarland Kitchen  [name], [name] NO.:  [id number]   MEDICAL RECORD NO.:  [id number]          PATIENT TYPE:  INP   LOCATION:  4743                         FACILITY:  [facility]   PHYSICIAN:  Cassell Clement, M.D. DATE OF BIRTH:  22-Sep-1940   DATE OF ADMISSION:  06/13/2006  DATE OF DISCHARGE:  06/14/2006                               DISCHARGE SUMMARY   FINAL DIAGNOSES:  1. Cardiac dysrhythmia with repetitive implantable cardioverter      defibrillator shocks five times secondary to sinus tachycardia.  2. Coronary atherosclerosis with prior extensive myocardial infarction      and depressed left ventricular systolic function.  3. Chronic Coumadin anticoagulation.  4. Amiodarone induced thyrotoxicosis.   OPERATIONS PERFORMED:  Reprogramming of ICD by Dr. Sherryl Manges.   HISTORY:  This 70 year old gentleman with known coronary artery disease  had an old extensive anterior myocardial infarction and a prior history  of ventricular tachycardia who has an AICD in place.  He was admitted  after his defibrillator started shocking him repetitively this morning  after he had taken a shower.  He had total of about five shocks.  He had  previously been doing well and his arrhythmia (paroxysmal ventricular  tachycardia) had been well controlled on a combination of mexiletine and  amiodarone.  However, amiodarone was recently stopped secondary to  discovery of thyrotoxicosis felt secondary to amiodarone.  He had seen  Dr. Ardyth Harps three days ago on June 10, 2006, and had been started  on prednisone and Tapazole at that point.  Previously he had been on  amiodarone 200 mg 5 days a week which had been stopped 10 days ago. This  morning, as he was toweling off from his shower, he had suffered an  unexpected shock from his defibrillator which then went off a total of  about five times.  He came to the emergency room and was admitted.  He  was initially placed on IV lidocaine, thinking  that this was recurrent  ventricular tachycardia.  However, interrogation of the device by Dr.  Graciela Husbands revealed that he was getting multiple shocks inappropriately as  the device was recognizing sinus tachycardia.  The device had been  programmed to detect rhythms greater than 110 beats a minute and his  thyrotoxicosis had resulted in a sinus tachycardia which was triggering  the defibrillator.  His device was reprogrammed to detect rates above  145 and he was also placed on increased beta-blockers to slow his  ventricular response.  Lidocaine was discontinued.  The patient remained  stable overnight on the higher dose of beta blocker and had no further  ICD shocks and was able to be discharged improved on the following day,  June 14, 2006.   ACCESSORY LABORATORY STUDIES:  Chest x-ray showed heart size upper  normal.  No pulmonary vascular congestion or active lung process.   Hemoglobin 12.7, hematocrit 37, white count 7900.  Pro time was 25.5  with an INR of 2.2.  Blood sugar 130.  Electrolytes normal.  Potassium  3.5, BUN 17.  Albumin 3.1, AST 43, ALT 58. B natriuretic peptide 423.  CK-MBs  were 5.9,12.2 and 7.8 with troponins 0.19, 1.12 and 0.85.  It was  felt that the enzyme rise was secondary to repeated shock from the  defibrillator.  His cholesterol was 133, LDL 76, HDL 38, triglycerides  94.  His amiodarone level was 0.57 with reference range of 0.50-2.  His  T4 is still elevated at 20.4, T3 elevated at 57.1 with a low TSH of less  than 0.004.   The patient was discharged improved on low sodium heart healthy diet.   DISCHARGE MEDICATIONS:  1. He will be on prednisone as per Dr. Rinaldo Cloud instructions.  2. He will be on Lopressor 25 mg twice a day.  3. K-Dur 20 three times a day.  4. Colace 100 mg daily.  5. Aspirin 81 mg daily.  6. Hyzaar 100/25 one daily.  7. Mexiletine 150 mg twice a day.  8. Coumadin 5 mg taking half a tablet 5/7 days.  9. Imdur 30 mg daily.  10.Zantac  75 mg daily.  11.Coreg CR 80 mg daily.  12.Loratadine 10 mg daily.  13.Plavix 75 mg daily.  14.Tapazole 10 mg t.i.d.  15.Xanax 0.25 mg h.s. p.r.n. for sleep or rest or anxiety.   FOLLOW UP:  He will see Dr. Patty Sermons in 1 week for office visit, pro  time, BMET and an EKG.  He will keep his appointments with Dr. Evlyn Kanner and  with Dr. Graciela Husbands as scheduled.   CONDITION ON DISCHARGE:  Improved.           ______________________________  Cassell Clement, M.D.     TB/MEDQ  D:  07/21/2006  T:  07/21/2006  Job:  [id number]   cc:   [name], MD, [facility]  Tera Mater. Evlyn Kanner, M.D.

## 2010-10-16 NOTE — Cardiovascular Report (Signed)
[name], [name] NO.:  [id number]   MEDICAL RECORD NO.:  [id number]                   PATIENT TYPE:  OIB   LOCATION:  2925                                 FACILITY:  [facility]   PHYSICIAN:  Peter M. Swaziland, M.D.               DATE OF BIRTH:  04-01-1941   DATE OF PROCEDURE:  07/11/2003  DATE OF DISCHARGE:                              CARDIAC CATHETERIZATION   INDICATION FOR PROCEDURE:  The patient is a 70 year old white male with  remote history of anterior myocardial infarction was angioplasty in 1991.  He presents now with recurrent monomorphic ventricular tachycardia.  He was  found to have continued patency of the LAD, but had a moderately severe  stenosis in the mid circumflex and severe stenosis in the proximal right  coronary artery.  During his cardiac catheterization, the patient had  multiple episodes of ventricular tachycardia requiring defibrillator  discharges.  For this reason, we performed his intervention under general  anesthesia.   PROCEDURE:  Percutaneous coronary stenting of the mid left circumflex  coronary artery and the proximal right coronary artery.   ACCESS:  Via the right femoral artery using standard Seldinger technique  also via the right femoral vein using the standard Seldinger technique.   EQUIPMENT:  7 French arterial sheath and a 6 French venous sheath.  We used  6 [place] JL-4 guide and a 6 [place] JR-4 guide with side holes, a 0.014 Hi-  Torque Floppy wire, 2.5 x 12-mm Quantum balloon, 3.0 x 18-mm  Cypher drug-  eluting stent and a 3.0 x 13-mm Cypher drug-eluting stent.   MEDICATIONS:  General anesthesia was performed by anesthesia.  Otherwise,  the patient was on amiodarone drip.  He received Angiomax at 0.75 mg/kg  bolus followed by drip at 1.75 mg/kg/hr.  He was given nitroglycerin 200 mcg  intracoronary x1.  The patient initially did become hypotensive after  induction, but this resolved and he remained  hemodynamically stable  throughout the procedure and had no episodes of ventricular tachycardia.   RIGHT CORONARY INTERVENTION:  After initial guide shots were obtained, we  initially predilated the proximal right coronary artery using a 2.5 x 12-mm  Quantum balloon up to 12 atmospheres.  We then followed with stent  deployment using a 3.0 x 18-mm Cypher stent deploying at 11 atmospheres and  then post dilating to 14 atmospheres.  This yielded an excellent  angiographic result with 0% residual stenosis and TIMI-3 flow.   We next addressed the mid circumflex lesion.  This was also crossed easily  with the wire.  We predilated, again, with a 2.5 mm Quantum balloon to 12  atmospheres.  We then deployed a 3.0 x 13-mm Cypher drug-eluting stent at 11  atmospheres and then post dilated to 14 atmospheres.  This yielded an  excellent angiographic result with 0% residual stenosis and TIMI-3 flow.  The patient had no complications and was awakened and  extubated prior to  leaving the lab.   FINAL INTERPRETATION:  Successful coronary stenting of the proximal right  coronary artery and the mid left circumflex coronary artery.                                               Peter M. Swaziland, M.D.    PMJ/MEDQ  D:  07/11/2003  T:  07/12/2003  Job:  [id number]   cc:   Cassell Clement, M.D.  [address]  [place]  [place] [address]  Fax: [phone]   [name], M.D.   Salvatore Decent Dorris Fetch, M.D.  [address]  [place]  [place] [address]

## 2010-10-16 NOTE — Letter (Signed)
June 23, 2006    Cassell Clement, M.D.  [address]  [place], [place] [address]   RE:  [name], [name]  MRN:  [id number]  /  DOB:  1940/12/20   Dear [name],   [name] comes in today status post inappropriate shocks for sinus  tachycardia in the context of his amiodarone-associated hypothyroidism.  His amiodarone has been discontinued. He is currently on mexiletine 150  mg b.i.d. He is on Coreg 80 with recent addition of metoprolol now at 50  mg a day for augmented rate control.   On examination, his blood pressure was 138/83. His pulse was 65.  LUNGS:  Were clear.  HEART:  Sounds were regular.  EXTREMITIES:  Were within normal limits edema.   His defibrillator was reprogrammed today to create a monitoring zone  between 120 and 150 to see if he is having slow ventricular tachycardia  which has been previously identified. We will  plan as you has scheduled a treadmill on February 13 to see what his  heart rate maximum is and then use that to program the lower rate of the  ventricular tachycardia detection rate.   Thanks for asking [place] to see the patient.    Sincerely,      [name], MD, [facility]  Electronically Signed    SCK/MedQ  DD: 06/23/2006  DT: 06/23/2006  Job #: [id number]   CC:    Tera Mater. Evlyn Kanner, M.D.

## 2010-10-16 NOTE — Consult Note (Signed)
[name], [name] NO.:  [id number]   MEDICAL RECORD NO.:  [id number]                   PATIENT TYPE:  OIB   LOCATION:  2925                                 FACILITY:  [facility]   PHYSICIAN:  Salvatore Decent. Dorris Fetch, M.D.         DATE OF BIRTH:  September 09, 1940   DATE OF CONSULTATION:  07/10/2003  DATE OF DISCHARGE:                                   CONSULTATION   REFERRING PHYSICIAN:  Peter M. Swaziland, M.D.   REASON FOR CONSULTATION:  Three-vessel disease with recurrent ventricular  tachycardia.   HISTORY OF PRESENT ILLNESS:  [name] is a 70 year old gentleman with  a history of acute myocardial infarction in 1991 when he was treated with  thrombolytics and subsequent angioplasty.  He has had known left ventricular  dysfunction with an ejection fraction of 23% in April of 2003.  He has not  had any angina or congestive heart failure.  While in [place] over Christmas,  he had onset of lightheadedness and weakness.  He felt his heart beating  fast.  He was taken to the hospital where he was found to be tachycardic.  Adenosine was tried without effect and he was cardioverted for ventricular  tachycardia.  He then had a Cardiolite which apparently was unremarkable and  then underwent ICD placement.  He was also treated with amiodarone, but this  was discontinued before he was sent back to [place], [place].  He  has continued to do well without any anginal type symptoms.  However, he was  awakened at approximately 3 a.m. recently with a defibrillator discharge.  He underwent cardiac catheterization yesterday which showed three-vessel  coronary artery disease.  He has only moderate disease in the left anterior  descending, but has significant disease in the right and the left  circumflex.  His ejection fraction is known to be approximately 25%.  During  his catheterization, he had four episodes of ventricular tachycardia.  He  also had another  episode during sheath removal.   PAST MEDICAL HISTORY:  Significant for:  1. Coronary artery disease with MI as previously mentioned.  2. Ischemic cardiomyopathy.  3. Hypercholesterolemia.  4. Recurrent ventricular tachycardia.  5. History of an arterial embolus to his right foot four years ago.  6. Degenerative joint disease.  7. History of tonsillectomy.   MEDICATIONS AT THE TIME OF ADMISSION:  1. Aspirin 81 mg daily.  2. Coreg 25 mg b.i.d.  3. Hyzaar 50/12.5 mg p.o. daily.  4. Lipitor 10 mg daily.  5. Coumadin 5 mg five days a week and none the other two days.  His Coumadin     currently is on hold.   ALLERGIES:  He is allergic to:  1. ASPIRIN.  2. IVP DYE.  3. ZOCOR.   FAMILY HISTORY:  His father died at 91 of an MI.  His mother had heart  disease at age 8.   SOCIAL HISTORY:  He is married with  two children.  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  He quit smoking in 1990.  He occasionally uses ethanol  socially.   REVIEW OF SYSTEMS:  He had been doing well and with no complaints until  these recent episodes.  All systems are negative.   PHYSICAL EXAMINATION:  GENERAL APPEARANCE:  [name] is a 70 year old  white male in no acute distress.  He is well developed and well nourished.  NEUROLOGIC:  Alert and oriented x 3 and grossly intact.  VITAL SIGNS:  His blood pressure is 130/80, his pulse is 62 and regular, and  respirations are 10.  HEENT:  Unremarkable.  NECK:  Without thyromegaly, adenopathy, or bruits.  LUNGS:  Clear with equal breath sounds bilaterally.  CARDIAC:  Regular rate and rhythm.  Normal S1 and S2.  There is no S3 or  murmur.  CHEST:  He has an ICD in place in the left chest.  The wound is healing  well.  ABDOMEN:  Soft and nontender.  EXTREMITIES:  Without cyanosis, clubbing, or edema.  He has 2+ pulses  throughout.  SKIN:  Warm and dry.   LABORATORY DATA:  The chest x-ray shows no active disease.  There is an ICD  in place.   The EKG showed sinus bradycardia with PVCs.  There is an old  anterior MI.   White count 4.8, hematocrit 43, platelets 284.  PT 12.2, PTT 24.  His  glucose was 119, BUN 18, and creatinine 1.2.  Sodium, potassium, and other  electrolytes within normal limits.   IMPRESSION:  [name] is a 70 year old gentleman who presented with  recurrent ventricular tachycardia.  He has severe two-vessel disease.  He  only has moderate disease in the LAD and a particularly tight lesion in the  mid right coronary.  He has known ischemic cardiomyopathy with an ejection  fraction of 25% and has had recurrent monomorphic ventricular tachycardia.   There are several issues to be considered.  First of all, I agree that  revascularization is indicated in the setting of recurrent VT despite the  absence of anginal symptoms.  He is a good candidate for coronary artery  bypass grafting with relatively low risk despite his ejection fraction of  25%.  He has had that chronically and is very well compensated with no  evidence of congestive heart failure and no recent anginal spells.  He has  good target vessels and good overall physical condition.  Therefore I think  his operative mortality would be low.   Secondly, his lesions are favorable for percutaneous intervention.  The only  question is whether he would have ventricular tachycardia or possible  ventricular fibrillation and become unstable.  Obviously if he were to  become unstable and require emergent transport to the OR with continual  arrhythmias, need for CPR, etc., his surgical risk would skyrocket.   He and his wife understand that even with revascularization, it is  potentially he could still have ventricular tachycardia if it is due to  permanent substrate, but that revascularization does take first priority and  may result in resolution of his ventricular tachycardia.  I had a long discussion with the patient and his wife regarding these   issues.  They understand all of the issues involved.  I also discussed with  Dr. Swaziland.  Dr. Swaziland feels comfortable with percutaneous intervention.  If after further discussion with the patient they decide to proceed with  PTCA, I would  support that option.  I would recommend that cardiology check  with the surgeon, preferably me since I already know the patient, and ensure  that a surgeon is available at the time the procedure is initiated.  If  recurrent VT is encountered while attempting PTCA, I would favor early  conversion to coronary artery bypass grafting rather than be faced with a  salvage situation with an unstable patient.   If the patient were to decide to chose CABG over the percutaneous  intervention, we could proceed later this week.                                               Salvatore Decent Dorris Fetch, M.D.    SCH/MEDQ  D:  07/10/2003  T:  07/10/2003  Job:  [id number]   cc:   Cassell Clement, M.D.  [address], [address]  [place]  [place] [address]  Fax: [phone]   [name], M.D.

## 2010-10-16 NOTE — H&P (Signed)
NAME:  [name], [name] NO.:  [id number]   MEDICAL RECORD NO.:  [id number]          PATIENT TYPE:  INP   LOCATION:  1826                         FACILITY:  [facility]   PHYSICIAN:  Cassell Clement, M.D. DATE OF BIRTH:  1940/09/29   DATE OF ADMISSION:  06/13/2006  DATE OF DISCHARGE:                              HISTORY & PHYSICAL   HISTORY:  This is a 70 year old gentleman with a history of known  coronary artery disease who is admitted after having five defibrillator  shocks this morning.  The patient has a history of known coronary artery  disease.  He had a remote anterior wall myocardial infarction in 1991  treated initially with TPA and followed by angioplasty to his left  anterior descending.  He had follow-up angiography in 1996 which showed  an ejection fraction of 29%.  He was hospitalized from February 8  through July 17, 2003 with paroxysmal recurrent ventricular  tachycardia and had implantation of an ICD device 2 months prior to that  while vacationing with family in [place] and developing ventricular  tachycardia while in [place] necessitating the implantation of his ICD  device while in [place].  He then returned to [facility] where he was  admitted with multiple episodes of ventricular tachycardia.  He  underwent cardiac catheterization July 09, 2003 by Dr. Peter Swaziland  and had PTCA with insertion of a drug-eluting coronary artery stent to  the right coronary artery and to the circumflex coronary artery on  July 11, 2003 under general anesthesia by Dr. Peter Swaziland.  He has  done well since that time with no further episodes of ventricular  tachycardia and has been on a combination of mexiletine and amiodarone  for arrhythmia suppression until recently.  However, the patient was  seen in the office on June 02, 2006 complaining of weight loss,  shortness of breath and increasing heart rate and blood work showed that  he well was thyrotoxic with  a free T4 of 6.34 and a TSH of less than  0.004.  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  He  has continued on mexiletine.  He saw Dr. Ardyth Harps 3 days ago for  endocrinology evaluation of his thyrotoxicosis and was started on  Tapazole 10 mg three times a day and prednisone 20 mg daily with a  tapering dosage.  He was feeling well until this morning when while  toweling off after his morning shower he had a precordial shock from his  defibrillator.  He had a total of five shocks.  He came in with the  history of his shocks and suspected possible recurrent VT secondary to  being off of amiodarone.   SOCIAL HISTORY:  His social history reveals that he is married.  He does  not use tobacco.  He drinks occasional wine.  He is a retired Customer service manager.   ALLERGIES:  HE IS ALLERGIC TO CONTRAST DYE BECAUSE OF THE IODINE.   REVIEW OF SYSTEMS:  Review of systems reveals that he has had a normal  appetite, he has not been experiencing any diarrhea or change in bowel  movement.  Remainder of review of systems is negative in detail.  He has  not experienced any chest pain.   PRESENT MEDICATIONS:  1. KCl 20 mEq twice a day.  2. Stool softener daily.  3. Aspirin 81 mg daily.  4. Loratadine 10 mg daily.  5. Hyzaar 100/25 one daily.  6. Imdur 30 mg at bedtime.  7. Ranitidine 75 mg daily.  8. Coreg CR 80 mg daily.  9. Xanax 0.25 mg h.s. p.r.n.  10.Mexiletine 150 mg p.o. b.i.d.  11.Coumadin 5 mg tablets taking half tablet 5 of 7 days.  12.Plavix 75 mg daily.  13.Tapazole 10 mg three times a day since June 10, 2006.  14.Prednisone 10 mg taking two tablets daily since June 10, 2006.   He has been off Lipitor since November 2007 secondary to leg pain.   PHYSICAL EXAMINATION:  VITAL SIGNS:  Blood pressure is 154/78, pulse is  111 and he is in sinus tachycardia.  HEENT:  Negative. Jugular  venous pressure normal.  CHEST:  Clear.  HEART:  Reveals sinus tachycardia and an S3 gallop.  ABDOMEN:  The abdomen is soft and nontender without hepatosplenomegaly  or masses.  EXTREMITIES:  The extremities show no phlebitis or edema.   STUDIES:  His electrocardiogram shows sinus tachycardia with old  anterior wall myocardial infarction with unchanged ST-segment elevation  anteriorly.  Chest x-ray shows no active disease, mild cardiomegaly.   LABORATORY WORK:  Hemoglobin 12.7, hematocrit 37.9 platelet count of  404,000.  Sodium 137, potassium 3.5, BUN 17, creatinine 1.02, blood  sugar of 130.  INR is 2.2.  AST 43 ALT 58, bilirubin 0.6.  Troponin  0.19, CK-MB 5.9.  Cholesterol 133, LDL of 76, HDL of 38, B-natriuretic  peptide 423.  Thyroid function studies are pending and a troponin level  is pending.   IMPRESSION:  1. Suspected recurrent ventricular tachycardia but when his      defibrillator was analyzed by Dr. Graciela Husbands, it turns out that the      defibrillator was triggered by sinus tachycardia and not by      ventricular tachycardia.  The defibrillator had been set to detect      rates above 110.  2. 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  3. Known ischemic heart disease status post old extensive anterior      wall myocardial infarction 1991 and status post percutaneous      transluminal coronary angioplasty of right coronary artery and      circumflex coronary artery on July 11, 2003 with drug-eluting      stents by Dr. Peter Swaziland.   DISPOSITION:  The patient will be admitted.  We are going to try to  control his sinus heart rate with additional beta blocker.  We will ask  Dr. Ardyth Harps to consult and Dr. Berton Mount has already consulted.           ______________________________  Cassell Clement, M.D.     TB/MEDQ  D:  06/13/2006  T:  06/13/2006  Job:  [id number]  cc:   [name], MD, [facility]  Tera Mater. Evlyn Kanner, M.D.

## 2010-10-16 NOTE — Cardiovascular Report (Signed)
NAMEMITCHELLE, [name] NO.:  [id number]   MEDICAL RECORD NO.:  [id number]                   PATIENT TYPE:  OIB   LOCATION:  2925                                 FACILITY:  [facility]   PHYSICIAN:  Peter M. Swaziland, M.D.               DATE OF BIRTH:  [birth year]   DATE OF PROCEDURE:  07/10/2003  DATE OF DISCHARGE:                              CARDIAC CATHETERIZATION   PROCEDURE:  Coronary angiography.   ACCESS:  Via the right femoral artery using standard Seldinger technique.   EQUIPMENT:  6 French 4 cm right and left Judkins catheters.   MEDICATIONS:  Total of 6 mg of IV Versed.  Local anesthesia was 1%  Xylocaine.  Lopressor 5 mg IV, amiodarone 150 mg IV bolus, nitroglycerin  drip at 10 mcg per minute.   CONTRAST:  60 mL of Omnipaque.   HEMODYNAMIC DATA:  Aortic pressure 134/86 with a mean of 107.   COMPLICATIONS:  After the left coronary angiograms were performed, the  patient developed sustained monomorphic ventricular tachycardia resulting in  defibrillator discharge.  This was followed by another episode of  ventricular tachycardia that was pace terminated by his ICD.  We then  performed one injection of the right coronary artery after which he again  sustained two episodes of sustained monomorphic ventricular tachycardia  resulting in two defibrillator shocks.  At this point, we terminated  procedure and stabilized his rhythm with IV amiodarone and IV Lopressor.  Left ventricular angiography was not performed due to the electrical  instability.   ANGIOGRAPHIC DATA:  The left coronary artery arises and distributes  normally.  The left main coronary artery has irregular plaque involving the  mid to distal left main with only approximately 20% narrowing.   The left anterior descending artery is diffusely diseased from the proximal  to mid vessel up to 40%, but there are no focal obstructive lesions noted.  There is a large septal perforator that  appears normal.   The left circumflex coronary artery is a large vessel that gives rise to  three large marginal vessels distally.  There is a 70-80% focal lesion in  the mid circumflex prior to the trifurcation of these vessels.   The right coronary artery arises and is a co-dominant vessel.  In the  proximal vessel, there is a focal stenosis with ulceration up to 90-95%.  The remainder of the right coronary artery is without significant disease.   FINAL INTERPRETATION:  1. Two-vessel obstructive atherosclerotic coronary artery disease.  There is     a focal lesion in the mid left circumflex coronary artery which is     moderately severe and a severe stenosis in the proximal right coronary     artery.  The left anterior descending which is a previous infarct vessel     has diffuse disease throughout the mid vessel, but no significant     obstructive lesions noted.  2. Electrical instability with recurrent episodes of sustained ventricular     tachycardia.   PLAN:  The left circumflex and right coronary artery lesions due appear to  be amenable to catheter based intervention with stenting, but we need to  ensure electrical stability prior to intervening.  For this reason, will  load with IV amiodarone and IV Lidocaine and treat with aggressive anti-  ischemic therapy for the next 48 hours and then consider intervention later  this week.                                               Peter M. Swaziland, M.D.    PMJ/MEDQ  D:  07/10/2003  T:  07/10/2003  Job:  [id number]   cc:   Cassell Clement, M.D.  [address]  [place]  [place] [address]  Fax: [phone]   [name], M.D.

## 2010-10-16 NOTE — Discharge Summary (Signed)
[name], TIERNO NO.:  [id number]   MEDICAL RECORD NO.:  [id number]                   PATIENT TYPE:  INP   LOCATION:  3706                                 FACILITY:  [facility]   PHYSICIAN:  Cassell Clement, M.D.              DATE OF BIRTH:  11-18-1940   DATE OF ADMISSION:  07/09/2003  DATE OF DISCHARGE:  07/17/2003                                 DISCHARGE SUMMARY   FINAL DIAGNOSES:  1. Paroxysmal ventricular tachycardia.  2. Ischemic cardiomyopathy.  3. 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  4. Status post implantation of automatic implantable cardiac defibrillator.  5. Hyperlipidemia.  6. Mexiletine induced diarrhea, resolved.   OPERATIONS PERFORMED:  1. Cardiac catheterization on July 09, 2003 by Dr. Peter Swaziland.  2. Multi Desault PTCA with insertion of drug eluding coronary artery stents     to the right coronary artery and circumflex coronary artery on July 11, 2003 under general anesthesia by Dr. Peter Swaziland.   HISTORY OF PRESENT ILLNESS:  This 70 year old Caucasian male was admitted on  July 09, 2003 by Dr. Peter Swaziland for cardiac catheterization. He has a  history of a remote anterior myocardial infarction in 1991, treated at that  time with TPA and followed by angioplasty to his left anterior descending.  He underwent followup angiography in 1996, which showed a 50% mid left  anterior descending stenosis, otherwise non-obstructive disease and his  ejection fraction at that time was 29%. He has been treated medically. He  had a Cardiolite study in our office on April 2003 which showed extensive  apical septal and inferior wall scar with an ejection fraction of 23%.  Functionally he was doing well, however, with no angina and there was no  evidence of any reversible ischemia on this study. Approximately 2 months  PTA, the patient was visiting family in [place] and he developed sustained  ventricular tachycardia  requiring emergency defibrillation and he underwent  a subsequent implantation of an ICD device while out in [place]. Since the  implant, he has had 4 more episodes of ventricular tachycardia, 3 of which  were pace terminated and 1 required defibrillation. Because of his recurrent  tachycardia it was felt that he should have cardiac catheterization to rule  out  an ischemic trigger. He denies any symptoms of chest pain or shortness  of breath and he has had no syncope even with the ventricular tachycardia.   ADMISSION MEDICATIONS:  1. Coumadin  2. Aspirin  3. Multivitamins  4. Coreg  5. Hyzaar  6. Lipitor.   SOCIAL HISTORY:  Th e patient is a former smoking, having quit in 1990. He  drinks occasional alcohol. He is a retired Horticulturist, commercial. He is  married and has 2 children. Following his retirement from [employer] he  has been working in [employer].   PHYSICAL EXAMINATION:  VITAL SIGNS:  Blood pressure 130/80, pulse 60  and  regular.  CHEST:  Clear.  HEART:  Regular rhythm without murmur, rub, or gallop.  ABDOMEN:  Negative.  EXTREMITIES:  Negative.   LABORATORY DATA:  Electrocardiogram shows sinus bradycardia with premature  ventricular contractions and an extensive old anterior infarction.   Chest x-ray shows no active disease and the ICD is in place.   HOSPITAL COURSE:  On the day of admission the patient underwent cardiac  catheterization. The catheterization was complicated by recurrent episodes  of ventricular tachycardia. He had a total of 4 episodes of V-tach during  the catheterization, 3 of which were terminated with ICD shock and 1 with  pace determination. The catheterization revealed a focal 70% to 80%  circumflex mid vessel lesion and a 95% irregular right groin artery stenotic  lesion proximally. It was felt that the patient had severe 2 vessel  obstructive coronary disease in distributions other than the initial left  anterior  descending occlusion, which had caused his original myocardial  infarction in 1991. It was felt that the patient needed rhythm stabilization  prior to any further therapy and therefore, stenting of the right coronary  artery and circumflex could not be done on July 08, 2002 as initially  anticipated. Because of its electrical instability and recurrent episodes of  ventricular tachycardia, the patient was seen in consultation also with Dr.  Berton Mount who recommended aggressive treatment of his ischemia after  control of his ventricular tachycardia with Lidocaine and Amiodarone. Dr.  Graciela Husbands also re-programmed his defibrillator. The patient was placed on high  dose IV Amiodarone and Lidocaine. He was seen in consultation by Dr. Andrey Spearman. It was felt that the patient's options were between another  attempt at percutaneous coronary intervention or with proceeding with bypass  graft surgery. After much discussion back and forth among the patient's  physicians. It was felt that the best plan would be to proceed with re-cath  under general anesthesia after the rhythm had been adequately stabilized  with medications. On July 11, 2003 under general anesthesia, the patient  was back in the catheterization lab. On this day, he had no episodes of V-  tach and the procedures went very smoothly. The patient initially underwent  angioplasty and stenting of his proximal right coronary artery with  excellent anatomic result and then a left circumflex 80% mid lesion was  similarly approached and stented by Dr. Swaziland with excellent anatomic  result. The patient was placed on aspirin and Plavix. His IV nitroglycerin  and IV heparin were discontinued post catheterization. His Amiodarone was  changed to oral and Lidocaine was discontinued the following morning.  However, the patient had an episode of ventricular tachycardia, initiated by a premature ventricular contractions while voiding on the  afternoon of  July 12, 2003. He was placed back on Lidocaine therefore. Amiodarone was  also increased back to 800 mg twice a day. Dr. Graciela Husbands suggested tapering  Lidocaine and switching to oral Mexiletine, which was done on July 13, 2003. His last episode of ventricular tachycardia was on the afternoon of  July 13, 2003 while asleep and was successfully tachy paced back to  sinus rhythm at that time. The patient had no further episode of V-tach. He  did develop some diarrhea from the Mexiletine and that was treated with  Lomotil and the diarrhea gradually subsided as he got used to the  Mexiletine. The patient was placed back on low dose Coumadin prior to  discharge because of his extensive  apical akinetic area. It was felt,  however, that with being on aspirin and Plavix, that we should keep the INR  around 2 and not push any higher. By July 17, 2003 the patient was doing  well and had been ambulating in the hall. Had had no further ventricular  tachycardia since July 13, 2003 and was able to be discharged home  improved.   DISCHARGE MEDICATIONS:  1. Coumadin 5 mg tablets taking 1/2 tablet daily except 1 on Monday and     Thursday.  2. Aspirin 81 mg daily.  3. Multivitamin 1 daily.  4. Coreg 25 mg twice a day.  5. Hyzaar 50/12.5 1 daily.  6. Lipitor 10 mg daily.  7. Plavix 75 mg daily.  8. Imdur 30 mg daily.  9. K-Dur 20 meq 3 times a day.  10.      Amiodarone 400 mg twice a day.  11.      Mexiletine 200 mg twice a day.  12.      Lomotil p.r.n.  13.      Nitrostat 1/150 p.r.n.   ACTIVITY:  He is to walk as tolerated. He is not to do any driving yet. He  may enter the phase 2 cardiac rehabilitative program.   DIET:  Low cholesterol diet.   FOLLOW UP:  He is to see Dr. Patty Sermons for blood work on Friday, July 19, 2003 and for an office visit on July 24, 2003. He is to see Dr.  Berton Mount in 2 weeks for followup.   LABORATORY DATA:  The patient  had a transthoracic echocardiogram on July 10, 2003 which showed an ejection fraction range being between 35% and 45%  with akinesis of the apical and peri-apical wall and severe hypokinesis of  the mid distal septal wall. The left atrium was mildly to moderately dilated  and there was mild mitral valvular regurgitation.   The patient had lipid panel showing cholesterol of 128, triglycerides 91,  HDL 49, LDL 61. TSH level was 2.16.   CONDITION ON DISCHARGE:  Improved.                                                Cassell Clement, M.D.    TB/MEDQ  D:  08/08/2003  T:  08/08/2003  Job:  [id number]   cc:   Salvatore Decent. Dorris Fetch, M.D.  [address]  [place]  [place] [address]   [name], M.D.   Peter M. Swaziland, M.D.  [address], [address]  [place], [place] [address]  Fax: [phone]

## 2010-10-27 ENCOUNTER — Encounter: Payer: Self-pay | Admitting: Internal Medicine

## 2010-11-02 ENCOUNTER — Ambulatory Visit (INDEPENDENT_AMBULATORY_CARE_PROVIDER_SITE_OTHER): Payer: Medicare Other | Admitting: *Deleted

## 2010-11-02 DIAGNOSIS — I255 Ischemic cardiomyopathy: Secondary | ICD-10-CM

## 2010-11-02 DIAGNOSIS — I2589 Other forms of chronic ischemic heart disease: Secondary | ICD-10-CM

## 2010-11-03 ENCOUNTER — Ambulatory Visit (INDEPENDENT_AMBULATORY_CARE_PROVIDER_SITE_OTHER): Payer: Medicare Other | Admitting: Internal Medicine

## 2010-11-03 ENCOUNTER — Encounter: Payer: Self-pay | Admitting: Internal Medicine

## 2010-11-03 DIAGNOSIS — I255 Ischemic cardiomyopathy: Secondary | ICD-10-CM

## 2010-11-03 DIAGNOSIS — I5022 Chronic systolic (congestive) heart failure: Secondary | ICD-10-CM

## 2010-11-03 DIAGNOSIS — R001 Bradycardia, unspecified: Secondary | ICD-10-CM

## 2010-11-03 DIAGNOSIS — I472 Ventricular tachycardia, unspecified: Secondary | ICD-10-CM

## 2010-11-03 DIAGNOSIS — I509 Heart failure, unspecified: Secondary | ICD-10-CM

## 2010-11-03 DIAGNOSIS — Z95810 Presence of automatic (implantable) cardiac defibrillator: Secondary | ICD-10-CM

## 2010-11-03 DIAGNOSIS — I498 Other specified cardiac arrhythmias: Secondary | ICD-10-CM

## 2010-11-03 DIAGNOSIS — I2589 Other forms of chronic ischemic heart disease: Secondary | ICD-10-CM

## 2010-11-03 NOTE — Assessment & Plan Note (Addendum)
The patient's device was interrogated.  The information was reviewed. Rate response was activated.

## 2010-11-03 NOTE — Assessment & Plan Note (Signed)
Much improved; rate response was activated

## 2010-11-03 NOTE — Progress Notes (Signed)
HPI  [name] is a 70 y.o. male Seen in followup for ischemiccardiomyopathy with prior MI. He has a history of ventricular tachycardia and previous storm. His treated with amiodarone which he developed thyrotoxicosis and has been treated subsequently with a combination of mexiletine and sotalol was largely quiet ventricular tachycardia.  He recently underwent device generator replacement received in upgrade to a CRT D. Device. This was accomplished with a right-sided LV lead tunneled to the left-sided pocket. He is much improved from an exercise point of view. He denies chest pain peripheral edema or palpitations.  Past Medical History  Diagnosis Date  . Myocardial infarction 1999    Remote anterolater and apical MI  . Cardiomyopathy, ischemic   . Chronic systolic congestive heart failure   . Ventricular tachycardia   . ICD (implantable cardiac defibrillator) in place   . 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 Abdominal aortic aneurysm 2009    Repaired with endovascular stent  . PVD (peripheral vascular disease)     Has occluded innominate vein  . Left bundle branch block   . High risk medication use     on Sotalol and Mexiletine  . CAD (coronary artery disease)     Remote PCI; s/p DES to LCX and RCA in 2005    Past Surgical History  Procedure Date  . Icd     Change out with LV lead placed with tunneling from the right  to the left side  . Endovascular stent insertion     for AAA  . Cardiac catheterization     x2    Current Outpatient Prescriptions  Medication Sig Dispense Refill  . ALPRAZolam (XANAX) 0.25 MG tablet Take 0.25 mg by mouth at bedtime as needed.        [name] amLODipine (NORVASC) 5 MG tablet Take 5 mg by mouth at bedtime.        . Ascorbic Acid (VITAMIN C) 500 MG tablet Take 500 mg by mouth daily.        [name] aspirin 81 MG tablet Take 81 mg by mouth daily.        . carvedilol (COREG) 12.5 MG tablet Take 12.5 mg by mouth 2 (two) times daily.        [name]  DOCUSATE CALCIUM PO Take 81 mg by mouth daily.        . isosorbide mononitrate (IMDUR) 30 MG 24 hr tablet TAKE ONE TABLET DAILY  90 tablet  3  . levothyroxine (SYNTHROID, LEVOTHROID) 88 MCG tablet Take 88 mcg by mouth daily.        . Loratadine (CLARITIN) 10 MG CAPS Take by mouth daily.        [name] losartan-hydrochlorothiazide (HYZAAR) 100-25 MG per tablet TAKE ONE TABLET DAILY  90 tablet  3  . meclizine (ANTIVERT) 25 MG tablet Take 25 mg by mouth as needed.        . mexiletine (MEXITIL) 150 MG capsule TAKE 1 CAPSULE THREE TIMES A DAY  270 capsule  3  . MULTIPLE VITAMIN PO Take by mouth daily.        . Omega-3 Fatty Acids (FISH OIL) 1000 MG CAPS Take by mouth 2 (two) times daily.        . potassium chloride SA (K-DUR,KLOR-CON) 20 MEQ tablet TAKE ONE TABLET THREE TIMES DAILY  270 tablet  3  . rosuvastatin (CRESTOR) 5 MG tablet Take 5 mg by mouth every other day.        [name]  sotalol (BETAPACE) 120 MG tablet TAKE ONE AND ONE-HALF TABLETS TWICE A DAY  270 tablet  3  . warfarin (COUMADIN) 2.5 MG tablet Take 2.5 mg by mouth as directed.        . meclizine (ANTIVERT) 25 MG tablet Take 1 tablet (25 mg total) by mouth 3 (three) times daily as needed.  270 tablet  1    Allergies  Allergen Reactions  . Iohexol      Code: HIVES, Desc: hives w/ itching during cardiac cath '99, mult caths since w/ ? premeds, DR.G.HAYES REQUESTS 13 HR PRE MED//A.C.pt okay w/13 hr prep/mms, Onset Date: [id number]   . Prednisone     Review of Systems negative except from HPI and PMH  Physical Exam Well developed and well nourished in no acute distress HENT normal E scleral and icterus clear Neck Supple JVP flat; carotids brisk and full Clear to ausculation Chest wall has well-healed incisions on both sides Regular rate and rhythm, no murmurs gallops or rub Soft with active bowel sounds No clubbing cyanosis and edema Alert and oriented, grossly normal motor and sensory function Skin Warm and Dry  ECG AV paced rhythm  with an upright QRS in lead V1 QRS duration is 144 ms  Assessment and  Plan

## 2010-11-03 NOTE — Assessment & Plan Note (Signed)
Stable on current medications 

## 2010-11-03 NOTE — Assessment & Plan Note (Signed)
Recurrent ventricular tachycardia treated with antitachycardia pacing; cycle length was 470 ms

## 2010-11-03 NOTE — Assessment & Plan Note (Signed)
In part iatrogenic; rate response was activated

## 2010-11-03 NOTE — Patient Instructions (Signed)
Your physician wants you to follow-up in: 6 months  You will receive a reminder letter in the mail two months in advance. If you don't receive a letter, please call our office to schedule the follow-up appointment.  Your physician recommends that you continue on your current medications as directed. Please refer to the Current Medication list given to you today.  

## 2010-11-13 ENCOUNTER — Encounter: Payer: Medicare Other | Admitting: Internal Medicine

## 2010-11-13 ENCOUNTER — Encounter (INDEPENDENT_AMBULATORY_CARE_PROVIDER_SITE_OTHER): Payer: Medicare Other | Admitting: *Deleted

## 2010-11-13 ENCOUNTER — Other Ambulatory Visit: Payer: Self-pay

## 2010-11-13 DIAGNOSIS — I459 Conduction disorder, unspecified: Secondary | ICD-10-CM

## 2010-11-13 NOTE — Progress Notes (Signed)
The patient presented to the office with a 2 hour history of feeling lousy. Interrogation of his ICD demonstrated that he was a slow ventricular tachycardia at a rate of 105 beats per minute. Physical examination was otherwise unremarkable. We undertook antitachycardia pacing and ultimately at a cycle length of 420 ms were able to terminate ventricular tachycardia restore an AV paced rhythm. Patient tolerated procedure well

## 2010-11-17 ENCOUNTER — Encounter: Payer: Self-pay | Admitting: Cardiology

## 2010-11-20 ENCOUNTER — Encounter: Payer: Medicare Other | Admitting: Internal Medicine

## 2010-12-07 ENCOUNTER — Ambulatory Visit (INDEPENDENT_AMBULATORY_CARE_PROVIDER_SITE_OTHER): Payer: Medicare Other | Admitting: Cardiology

## 2010-12-07 ENCOUNTER — Encounter: Payer: Self-pay | Admitting: Cardiology

## 2010-12-07 ENCOUNTER — Ambulatory Visit (INDEPENDENT_AMBULATORY_CARE_PROVIDER_SITE_OTHER): Payer: Medicare Other | Admitting: *Deleted

## 2010-12-07 DIAGNOSIS — I2589 Other forms of chronic ischemic heart disease: Secondary | ICD-10-CM

## 2010-12-07 DIAGNOSIS — I255 Ischemic cardiomyopathy: Secondary | ICD-10-CM

## 2010-12-07 DIAGNOSIS — E0590 Thyrotoxicosis, unspecified without thyrotoxic crisis or storm: Secondary | ICD-10-CM

## 2010-12-07 DIAGNOSIS — Z7901 Long term (current) use of anticoagulants: Secondary | ICD-10-CM

## 2010-12-07 NOTE — Progress Notes (Signed)
Nevyn Odor Date of Birth:  Feb 12, 1941 [facility] [address]   [address] [place], [place]  [address] [phone]           Fax   [phone]  History of Present Illness: This pleasant 70 year old gentleman is seen for a scheduled followup office visit.  He has a complex past cardiac history.  He has a history of a remote extensive anterolateral and apical myocardial infarction resulting in an ischemic cardiomyopathy.  He's had previous problems with recurrent ventricular tachycardia and has an ICD in place and is followed closely by Dr. Graciela Husbands.  He's had a history of thyrotoxicosis secondary to amiodarone.  He also has a history of having had an abdominal aortic aneurysm which was repaired with an endovascular stent.  He has a functioning ICD in place.  He had a recent left ventricular lead placement for his defibrillator on 07/29/10.  Current Outpatient Prescriptions  Medication Sig Dispense Refill  . ALPRAZolam (XANAX) 0.25 MG tablet Take 0.25 mg by mouth at bedtime as needed.        [name] amLODipine (NORVASC) 5 MG tablet Take 5 mg by mouth at bedtime.        . Ascorbic Acid (VITAMIN C) 500 MG tablet Take 500 mg by mouth daily.        . carvedilol (COREG) 12.5 MG tablet Take 12.5 mg by mouth 2 (two) times daily.        [name] DOCUSATE CALCIUM PO Take 81 mg by mouth daily.        . isosorbide mononitrate (IMDUR) 30 MG 24 hr tablet TAKE ONE TABLET DAILY  90 tablet  3  . levothyroxine (SYNTHROID, LEVOTHROID) 88 MCG tablet Take 88 mcg by mouth daily.        . Loratadine (CLARITIN) 10 MG CAPS Take by mouth daily.        [name] losartan-hydrochlorothiazide (HYZAAR) 100-25 MG per tablet TAKE ONE TABLET DAILY  90 tablet  3  . meclizine (ANTIVERT) 25 MG tablet Take 25 mg by mouth as needed.        . mexiletine (MEXITIL) 150 MG capsule TAKE 1 CAPSULE THREE TIMES A DAY  270 capsule  3  . MULTIPLE VITAMIN PO Take by mouth daily.        . Omega-3 Fatty Acids (FISH OIL) 1000 MG  CAPS Take by mouth 2 (two) times daily.        . potassium chloride SA (K-DUR,KLOR-CON) 20 MEQ tablet TAKE ONE TABLET THREE TIMES DAILY  270 tablet  3  . rosuvastatin (CRESTOR) 5 MG tablet Take 5 mg by mouth every other day.        . sotalol (BETAPACE) 120 MG tablet TAKE ONE AND ONE-HALF TABLETS TWICE A DAY  270 tablet  3  . warfarin (COUMADIN) 2.5 MG tablet Take 2.5 mg by mouth as directed.        . meclizine (ANTIVERT) 25 MG tablet Take 1 tablet (25 mg total) by mouth 3 (three) times daily as needed.  270 tablet  1    Allergies  Allergen Reactions  . Iohexol      Code: HIVES, Desc: hives w/ itching during cardiac cath '99, mult caths since w/ ? premeds, DR.G.HAYES REQUESTS 13 HR PRE MED//A.C.pt okay w/13 hr prep/mms, Onset Date: [id number]   . Prednisone     Patient Active Problem List  Diagnoses  . THYROTOXICOSIS  . ABDOMINAL AORTIC ANEURYSM  . Cardiomyopathy, ischemic  . Chronic  systolic congestive heart failure  . Ventricular tachycardia  . LBBB  . Chronic anticoagulation  . ICD (implantable cardiac defibrillator), biventricular,[place]  . Sinus bradycardia    History  Smoking status  . Former Smoker  Smokeless tobacco  . Former Neurosurgeon  . Quit date: 06/01/1987    History  Alcohol Use No    Family History  Problem Relation Age of Onset  . Stroke Mother   . Heart attack Father     Review of Systems: Constitutional: no fever chills diaphoresis or fatigue or change in weight.  Head and neck: no hearing loss, no epistaxis, no photophobia or visual disturbance. Respiratory: No cough, shortness of breath or wheezing. Cardiovascular: No chest pain peripheral edema, palpitations. Gastrointestinal: No abdominal distention, no abdominal pain, no change in bowel habits hematochezia or melena. Genitourinary: No dysuria, no frequency, no urgency, no nocturia. Musculoskeletal:No arthralgias, no back pain, no gait disturbance or myalgias. Neurological: No dizziness, no  headaches, no numbness, no seizures, no syncope, no weakness, no tremors. Hematologic: No lymphadenopathy, no easy bruising. Psychiatric: No confusion, no hallucinations, no sleep disturbance.    Physical Exam: Filed Vitals:   12/07/10 0929  BP: 120/78  Pulse: 70  The general appearance reveals a well-developed well-nourished gentleman in no distress.Pupils equal and reactive.   Extraocular Movements are full.  There is no scleral icterus.  The mouth and pharynx are normal.  The neck is supple.  The carotids reveal no bruits.  The jugular venous pressure is normal.  The thyroid is not enlarged.  There is no lymphadenopathy.The chest is clear to percussion and auscultation. There are no rales or rhonchi. Expansion of the chest is symmetrical.  The heart reveals no murmur gallop or rub.  He has a defibrillator unit in the left upper chest.The abdomen is soft and nontender. Bowel sounds are normal. The liver and spleen are not enlarged. There Are no abdominal masses. There are no bruits.The pedal pulses are good.  There is no phlebitis or edema.  There is no cyanosis or clubbing.Strength is normal and symmetrical in all extremities.  There is no lateralizing weakness.  There are no sensory deficits.The skin is warm and dry.  There is no rash.   Assessment / Plan: Continue same medication continue close followup in the Coumadin clinic.  Return for an office visit with Lawson Fiscal in about 2 months

## 2010-12-07 NOTE — Assessment & Plan Note (Signed)
The patient has a past history of Thyrotoxicosis secondary to amiodarone.  Centrally developed hypothyroidism and presently is on replacement dose of Synthroid and this is followed closely by Dr. Evlyn Kanner.  He is not having any symptoms of hyper or hypothyroidism

## 2010-12-07 NOTE — Assessment & Plan Note (Signed)
The patient is on long-term Coumadin.  He's not having any side effects or problems from the Coumadin.  He denies any awareness of hematuria or hematochezia.  No melena

## 2010-12-07 NOTE — Assessment & Plan Note (Signed)
The patient has a past history of an ischemic cardiomyopathy.  He is on long-term Coumadin anticoagulation for his dilated left ventricle with left ventricular dysfunction.  His ejection fraction is 15-20%.  His last cardiac catheterization was 05/21/09 which demonstrated widely patent stents to the circumflex artery and the right coronary artery and no critical lesions it would suggest that his problems with ventricular tachycardia were ischemic in nature.  The patient has not been experiencing any chest pain or angina.

## 2010-12-26 ENCOUNTER — Inpatient Hospital Stay (HOSPITAL_COMMUNITY)
Admission: EM | Admit: 2010-12-26 | Discharge: 2010-12-27 | DRG: 309 | Disposition: A | Discharge status: Home / Self Care | Payer: Medicare Other | Attending: Internal Medicine | Admitting: Internal Medicine

## 2010-12-26 ENCOUNTER — Emergency Department (HOSPITAL_COMMUNITY): Payer: Medicare Other

## 2010-12-26 DIAGNOSIS — E785 Hyperlipidemia, unspecified: Secondary | ICD-10-CM | POA: Diagnosis present

## 2010-12-26 DIAGNOSIS — I2510 Atherosclerotic heart disease of native coronary artery without angina pectoris: Secondary | ICD-10-CM | POA: Diagnosis present

## 2010-12-26 DIAGNOSIS — R002 Palpitations: Secondary | ICD-10-CM | POA: Diagnosis present

## 2010-12-26 DIAGNOSIS — Z9861 Coronary angioplasty status: Secondary | ICD-10-CM

## 2010-12-26 DIAGNOSIS — Z7901 Long term (current) use of anticoagulants: Secondary | ICD-10-CM

## 2010-12-26 DIAGNOSIS — I2589 Other forms of chronic ischemic heart disease: Secondary | ICD-10-CM | POA: Diagnosis present

## 2010-12-26 DIAGNOSIS — I4729 Other ventricular tachycardia: Principal | ICD-10-CM | POA: Diagnosis present

## 2010-12-26 DIAGNOSIS — I509 Heart failure, unspecified: Secondary | ICD-10-CM | POA: Diagnosis present

## 2010-12-26 DIAGNOSIS — I472 Ventricular tachycardia, unspecified: Principal | ICD-10-CM | POA: Diagnosis present

## 2010-12-26 DIAGNOSIS — I5022 Chronic systolic (congestive) heart failure: Secondary | ICD-10-CM | POA: Diagnosis present

## 2010-12-26 DIAGNOSIS — I10 Essential (primary) hypertension: Secondary | ICD-10-CM | POA: Diagnosis present

## 2010-12-26 DIAGNOSIS — I252 Old myocardial infarction: Secondary | ICD-10-CM

## 2010-12-26 DIAGNOSIS — Z95810 Presence of automatic (implantable) cardiac defibrillator: Secondary | ICD-10-CM

## 2010-12-26 DIAGNOSIS — Z8249 Family history of ischemic heart disease and other diseases of the circulatory system: Secondary | ICD-10-CM

## 2010-12-26 LAB — DIFFERENTIAL
Lymphocytes Relative: 24 % (ref 12–46)
Monocytes Absolute: 0.8 10*3/uL (ref 0.1–1.0)
Monocytes Relative: 13 % — ABNORMAL HIGH (ref 3–12)
Neutro Abs: 4 10*3/uL (ref 1.7–7.7)

## 2010-12-26 LAB — CBC
HCT: 39.2 % (ref 39.0–52.0)
Hemoglobin: 13.5 g/dL (ref 13.0–17.0)
MCH: 31.3 pg (ref 26.0–34.0)
MCHC: 34.4 g/dL (ref 30.0–36.0)

## 2010-12-26 LAB — BASIC METABOLIC PANEL
GFR calc non Af Amer: >60 mL/min (ref >60)
Glucose, Bld: 105 mg/dL — ABNORMAL HIGH (ref 70–99)
Potassium: 4.3 mEq/L (ref 3.5–5.1)
Sodium: 138 mEq/L (ref 135–145)

## 2010-12-26 LAB — TROPONIN I: Troponin I: <0.3 ng/mL (ref <0.30)

## 2010-12-26 LAB — CK TOTAL AND CKMB (NOT AT ARMC): CK, MB: 2.5 ng/mL (ref 0.3–4.0)

## 2010-12-27 DIAGNOSIS — I4891 Unspecified atrial fibrillation: Secondary | ICD-10-CM

## 2010-12-27 LAB — COMPREHENSIVE METABOLIC PANEL
AST: 21 U/L (ref 0–37)
BUN: 14 mg/dL (ref 6–23)
CO2: 26 mEq/L (ref 19–32)
Calcium: 9.6 mg/dL (ref 8.4–10.5)
Creatinine, Ser: 0.98 mg/dL (ref 0.50–1.35)
GFR calc Af Amer: >60 mL/min (ref >60)
GFR calc non Af Amer: >60 mL/min (ref >60)
Glucose, Bld: 113 mg/dL — ABNORMAL HIGH (ref 70–99)
Total Bilirubin: 0.4 mg/dL (ref 0.3–1.2)

## 2010-12-27 LAB — CARDIAC PANEL(CRET KIN+CKTOT+MB+TROPI)
CK, MB: 2.7 ng/mL (ref 0.3–4.0)
Relative Index: INVALID (ref 0.0–2.5)
Total CK: 79 U/L (ref 7–232)
Total CK: 70 U/L (ref 7–232)

## 2010-12-27 LAB — PROTIME-INR
INR: 2.02 — ABNORMAL HIGH (ref 0.00–1.49)
Prothrombin Time: 23.2 seconds — ABNORMAL HIGH (ref 11.6–15.2)

## 2010-12-27 LAB — MRSA PCR SCREENING: MRSA by PCR: NEGATIVE

## 2010-12-30 ENCOUNTER — Encounter: Payer: Self-pay | Admitting: Internal Medicine

## 2010-12-30 ENCOUNTER — Ambulatory Visit (INDEPENDENT_AMBULATORY_CARE_PROVIDER_SITE_OTHER): Payer: Medicare Other | Admitting: Internal Medicine

## 2010-12-30 DIAGNOSIS — I255 Ischemic cardiomyopathy: Secondary | ICD-10-CM

## 2010-12-30 DIAGNOSIS — I498 Other specified cardiac arrhythmias: Secondary | ICD-10-CM

## 2010-12-30 DIAGNOSIS — I5022 Chronic systolic (congestive) heart failure: Secondary | ICD-10-CM

## 2010-12-30 DIAGNOSIS — R001 Bradycardia, unspecified: Secondary | ICD-10-CM

## 2010-12-30 DIAGNOSIS — I509 Heart failure, unspecified: Secondary | ICD-10-CM

## 2010-12-30 DIAGNOSIS — Z95810 Presence of automatic (implantable) cardiac defibrillator: Secondary | ICD-10-CM

## 2010-12-30 DIAGNOSIS — I472 Ventricular tachycardia: Secondary | ICD-10-CM

## 2010-12-30 DIAGNOSIS — I2589 Other forms of chronic ischemic heart disease: Secondary | ICD-10-CM

## 2010-12-30 NOTE — Progress Notes (Signed)
HPI  [name] is a 70 y.o. male has a history of ventricular tachycardia and previous storm. His treated with amiodarone which he developed thyrotoxicosis and has been treated subsequently with a combination of mexiletine and sotalol . He has had slow ventricular tachycardia and indeed he ended up in the emergency room again with ventricular tachycardia below the detection rate. The device was reprogrammed to a detection of 110-101. The VT was terminated by ATP.  He was discharged on a higher dose of carvedilol. Over the last 24 hours he has had significant low blood pressures in the range of 70-90. He stopped his amlodipine on his own  He has a history of ischemic heart disease with prior anteroapical MI. He is status post stenting. These were demonstrated as patent 2011  He recently underwent device generator replacement received in upgrade to a CRT D. Device. This was accomplished with a right-sided LV lead tunneled to the left-sided pocket. He is much improved from an exercise point of view. He denies chest p  Past Medical History  Diagnosis Date  . coronary artery disease 1999    Remote anterolater and apical MI, cath 2011 patent stents RCA/LCx  LAD w/o obstruction  . Cardiomyopathy, ischemic     EF 20-25%  echo 2012  . Chronic systolic congestive heart failure   . Ventricular tachycardia     resurrent slow 100-110 bpm,  Rx mexilitene/sotalol  . ICD (implantable cardiac defibrillator) in place     [employer] jud[employer]  . 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 Abdominal aortic aneurysm 2009    Repaired with endovascular stent  . PVD (peripheral vascular disease)     Has occluded innominate vein  . Left bundle branch block     Past Surgical History  Procedure Date  . Icd     Change out with LV lead placed with tunneling from the right  to the left side  . Endovascular stent insertion     for AAA  . Cardiac catheterization     x2    Current Outpatient Prescriptions    Medication Sig Dispense Refill  . ALPRAZolam (XANAX) 0.25 MG tablet Take 0.25 mg by mouth at bedtime as needed.        [name] amLODipine (NORVASC) 5 MG tablet Take 5 mg by mouth at bedtime.        . Ascorbic Acid (VITAMIN C) 500 MG tablet Take 500 mg by mouth daily.        . carvedilol (COREG) 12.5 MG tablet Take 12.5 mg by mouth 2 (two) times daily.        [name] DOCUSATE CALCIUM PO Take 81 mg by mouth daily.        . isosorbide mononitrate (IMDUR) 30 MG 24 hr tablet TAKE ONE TABLET DAILY  90 tablet  3  . levothyroxine (SYNTHROID, LEVOTHROID) 88 MCG tablet Take 88 mcg by mouth daily.        . Loratadine (CLARITIN) 10 MG CAPS Take by mouth daily.        [name] losartan-hydrochlorothiazide (HYZAAR) 100-25 MG per tablet TAKE ONE TABLET DAILY  90 tablet  3  . meclizine (ANTIVERT) 25 MG tablet Take 25 mg by mouth as needed.        . mexiletine (MEXITIL) 150 MG capsule TAKE 1 CAPSULE THREE TIMES A DAY  270 capsule  3  . MULTIPLE VITAMIN PO Take by mouth daily.        . Omega-3 Fatty  Acids (FISH OIL) 1000 MG CAPS Take by mouth 2 (two) times daily.        . potassium chloride SA (K-DUR,KLOR-CON) 20 MEQ tablet TAKE ONE TABLET THREE TIMES DAILY  270 tablet  3  . rosuvastatin (CRESTOR) 5 MG tablet Take 5 mg by mouth every other day.        . sotalol (BETAPACE) 120 MG tablet TAKE ONE AND ONE-HALF TABLETS TWICE A DAY  270 tablet  3  . warfarin (COUMADIN) 2.5 MG tablet Take 2.5 mg by mouth as directed.        . meclizine (ANTIVERT) 25 MG tablet Take 1 tablet (25 mg total) by mouth 3 (three) times daily as needed.  270 tablet  1    Allergies  Allergen Reactions  . Iohexol      Code: HIVES, Desc: hives w/ itching during cardiac cath '99, mult caths since w/ ? premeds, DR.G.HAYES REQUESTS 13 HR PRE MED//A.C.pt okay w/13 hr prep/mms, Onset Date: [id number]   . Prednisone     Review of Systems negative except from HPI and PMH  Physical Exam Well developed and well nourished in no acute distress HENT normal E  scleral and icterus clear Neck Supple JVP 6-7  carotids brisk and full Clear to ausculation Regular rate and rhythm, no murmurs gallops or rub Soft with active bowel sounds No clubbing cyanosis and edema Alert and oriented, grossly normal motor and sensory function Skin Warm and Dry     Assessment and  Plan

## 2010-12-30 NOTE — Assessment & Plan Note (Signed)
Stable on current medication. These were adjusted in the emergency room. His carvedilol was increased from 12.5-25 b.i.d. The last couple of days he has had significant low blood pressure. We'll DC his amlodipine

## 2010-12-30 NOTE — Assessment & Plan Note (Signed)
Patient had recurrent ventricular tachycardia over the weekend. We had a lengthy discussion today regarding treatment options specifically, catheter ablation versus continued current therapy. For right now we would like to do the latter.  We'll see him in 3 months.

## 2010-12-30 NOTE — Patient Instructions (Signed)
Your physician recommends that you schedule a follow-up appointment in: 3 months with Dr. Graciela Husbands.  We will reschedule your coumadin appointment out for 3 weeks, since they checked it in the ER this weekend.  Your physician recommends that you continue on your current medications as directed. Please refer to the Current Medication list given to you today.

## 2010-12-30 NOTE — Assessment & Plan Note (Signed)
Continue current medications. 

## 2010-12-31 NOTE — H&P (Signed)
NAMEMarland [name]  [name], [name] NO.:  [id number]  MEDICAL RECORD NO.:  [id number]  LOCATION:  [facility]                         FACILITY:  [facility]  PHYSICIAN:  Armanda Magic, M.D.     DATE OF BIRTH:  [birth year]  DATE OF ADMISSION:  12/26/2010 DATE OF DISCHARGE:                             HISTORY & PHYSICAL   REFERRING PHYSICIAN:  Dr. Radford Pax.  PRIMARY CARDIOLOGIST:  Cassell Clement, MD  PRIMARY ELECTROPHYSIOLOGIST:  [name], MD, [facility]  CHIEF COMPLAINT:  Palpitations.  HISTORY OF PRESENT ILLNESS:  This is a 70 year old male with a history of ischemic cardiomyopathy EF 24%, CAD, abdominal aortic aneurysm, New York Heart Association class III, chronic systolic heart failure, hypertension who presented today with complaints of palpitations.  This started around 4 p.m.  His AICD is programmed to deliver therapy to 115 beats per minute.  In the ER, he was noted to have wide complex tachycardia to 160 beats per minute consistent with ventricular tachycardia.  He denies any chest pain but has been having some shortness of breath and diaphoresis.  He denies any dizziness at this time.  Apparently, this occurred several weeks to months ago and he called Dr. Graciela Husbands, went to his office and his defibrillator detection rate was decreased down to 100 beats per minute so that he would receive ATP therapy which immediately converted him back to sinus rhythm.  He says his symptoms are identical to what they were when he presented to Dr. Odessa Fleming office.  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  PAST SURGICAL HISTORY:  Status post AICD implantation with CRT upgrade in March 2012, endovascular aneurysm repair.  ALLERGIES:  PREDNISONE, IV DYE, ZOCOR, AMIODARONE caused hypothyroidism.  FAMILY HISTORY:  His father died of MI at 9, his mother died of CAD at 38.  SOCIAL HISTORY:  He is retired.  He is married.  He has 2 children.  He quit tobacco in 1990.  MEDICATIONS: 1. Xanax 0.25 mg q. 6 h. P.r.n. 2. Amlodipine 5 mg daily. 3. Coreg 12.5 mg b.i.d. 4. Crestor 5 mg q. 48 hours. 5. Colace 100 mg daily. 6. Fish oil 1 tablet b.i.d. 7. Hyzaar 100/25 mg daily. 8. Imdur 30 mg daily. 9. Loratadine 10 mg daily. 10.Meclizine 25 mg b.i.d. 11.Mexiletine 150 mg q. 8 hours. 12.Multivitamin. 13.KCl 20 mEq t.i.d. 14.Sotalol 120 mg 1-1/2s tablet b.i.d. 15.Synthroid 88 mcg daily. 16.Vitamin C. 17.Coumadin.  REVIEW OF SYSTEMS:  Other than stated in HPI is negative.  PHYSICAL EXAMINATION:  VITAL SIGNS:  Blood pressure is 130/78, heart rate 106. GENERAL:  He is a well-developed, well-nourished male  in no acute distress. HEENT:  Benign. NECK:  Supple without lymphadenopathy.  Carotid upstrokes 2+ bilaterally no bruits. LUNGS:  Clear to auscultation throughout. HEART:  Regular rate and rhythm.  No murmurs, rubs or gallops.  Normal S1-S2. ABDOMEN:  Soft, nontender, nondistended.  Normoactive bowel sounds.  No hepatosplenomegaly. EXTREMITIES:  No cyanosis, erythema or edema.  LABORATORY DATA:  Sodium 138, potassium 4.3, chloride 103, bicarb 27, BUN 16, creatinine 1.16.  Glucose 105.  White cell count 6.6, hemoglobin 13.5, hematocrit 39.6, platelet count 233,  troponin less than 0.3.  CPK 88, MB 2.5.  Chest x-ray shows cardiomegaly with mild vascular congestion.  EKG shows wide complex tachycardia at 106 beats per minute.  ASSESSMENT: 1. Palpitations.  EKG today showing wide complex tachycardia at 106     beats per minute consistent with ventricular tachycardia.     Automatic implantable cardioverter-defibrillator interrogation     showing several episodes recently of nonsustained ventricular     tachycardia terminated with ATP therapy but these were in the range     of 126 beats per minute and his detect rate was set at 115 beats     per minute. 2. History of ventricular tachycardia storm on mexiletine and sotalol. 3. Automatic implantable cardioverter-defibrillator status post CRT     upgrade in March 2012. 4. Ischemic dilated cardiomyopathy, ejection fraction 35%. 5. Class II New York Heart Association class congestive heart failure. 6. Atherosclerotic coronary artery disease status post myocardial     infarction in 1991, status post percutaneous transluminal coronary     angioplasty, then percutaneous coronary intervention to the left     circumflex and right coronary artery in 2005.  Catheterization in     2010 showed, patent stent. 7. Hypertension.  PLAN:  Admit to CCU.  Continue sotalol and mexiletine.  Continue current medications.  We will have the patient recommend to lower detection rate to 100 beats per minute to allow for ATP therapy to terminate VT. Further workup per EP.  We will give IV Lopressor p.r.n. to try to keep heart rate around 60 as long as systolic blood pressure greater than 100.     Armanda Magic, M.D.     TT/MEDQ  D:  12/26/2010  T:  12/26/2010  Job:  [id number]  cc:   [name], MD, [facility]  Electronically Signed by Armanda Magic M.D. on 12/31/2010 08:54:14 AM

## 2011-01-04 ENCOUNTER — Encounter: Payer: Medicare Other | Admitting: *Deleted

## 2011-01-08 ENCOUNTER — Other Ambulatory Visit: Payer: Self-pay | Admitting: *Deleted

## 2011-01-08 DIAGNOSIS — F419 Anxiety disorder, unspecified: Secondary | ICD-10-CM

## 2011-01-08 MED ORDER — ALPRAZOLAM 0.25 MG PO TABS: 0.2500 mg | ORAL_TABLET | Freq: Four times a day (QID) | ORAL | Status: DC | PRN

## 2011-01-08 NOTE — Telephone Encounter (Signed)
Entered from paper to EMR with different directions.  Should have been QID

## 2011-01-18 ENCOUNTER — Ambulatory Visit (INDEPENDENT_AMBULATORY_CARE_PROVIDER_SITE_OTHER): Payer: Medicare Other | Admitting: *Deleted

## 2011-01-18 ENCOUNTER — Encounter: Payer: Self-pay | Admitting: Surgery

## 2011-01-18 DIAGNOSIS — I2589 Other forms of chronic ischemic heart disease: Secondary | ICD-10-CM

## 2011-01-18 DIAGNOSIS — I428 Other cardiomyopathies: Secondary | ICD-10-CM

## 2011-01-21 NOTE — Discharge Summary (Signed)
NAMEMarland Kitchen  [name], [name] NO.:  [id number]  MEDICAL RECORD NO.:  [id number]  LOCATION:  2911                         FACILITY:  [facility]  PHYSICIAN:  Hillis Range, MD       DATE OF BIRTH:  Jan 04, 1941  DATE OF ADMISSION:  12/26/2010 DATE OF DISCHARGE:  12/27/2010                              DISCHARGE SUMMARY   PROCEDURES: 1. Portable chest x-ray. 2. FastPath device interrogation.  PRIMARY FINAL DISCHARGE DIAGNOSIS:  Ventricular tachycardia (slow), antitachycardia pacing terminated.  SECONDARY DIAGNOSES: 1. Ischemic cardiomyopathy, ejection fraction 24% previously and 25%-     30% by echocardiogram in February 2012. 2. History of anterior myocardial infarction in 1999, with t-PA and     percutaneous intervention. 3. History of stenting to the right coronary artery and circumflex in     2005. 4. Status post insertion of [employer] implantable cardioverter     defibrillator in December 2004, in [place].5. Status post endovascular repair of abdominal aortic aneurysm in     2009. 6. History of monomorphic ventricular tachycardia. 7. Hypertension. 8. Intolerance to AMIODARONE with hypothyroidism as well as adverse     reaction to HIGH-DOSE STEROIDS and IV DYE. 9. Hyperlipidemia. 10.Chronic systolic congestive heart failure. 11.Family history of coronary artery disease in his father and sister.  TIME AT DISCHARGE:  Thirty three minutes.  HOSPITAL COURSE:  [name] is a 70 year old male with a history of coronary artery disease and VT.  He had palpitations and his EKG showed wide-complex tachycardia, rate 106.  He was admitted for further evaluation.  On interrogation of his device, he had several episodes of VT terminated with ATP therapy, but this detection rate was at 115.  The rates that were ATP terminated were at 126.  No therapy was initiated by the device for VT with a rate of 106.  He has a history of multiple episodes of VT including a VT storm and  has been on mexiletine and sotalol.  These were continued.  On December 27, 2010, [name] was evaluated by Dr. Johney Frame.  His labs were reviewed.  His INR was therapeutic at 2.02.  His cardiac enzymes were negative for MI.  His potassium level was 4.3 on admission.  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  Dr. Johney Frame reviewed all the date and examined the patient.  [name] was improved.  He did not have any VT with ambulation.  Dr. Johney Frame considered him stable for discharge on December 27, 2010, to follow up with Dr. Graciela Husbands.  DISCHARGE INSTRUCTIONS:  His activity level is to be increased gradually.  He is not to drive until Dr. Graciela Husbands clears him.  He is to weigh himself daily.  He is to follow up with Dr. Graciela Husbands on Wednesday. He is to follow up with Dr. Patty Sermons as needed.  He is encouraged to stick to a low-sodium, heart-healthy diet.  DISCHARGE MEDICATIONS: 1. Hyzaar 100/25 daily. 2. Mexiletine 150 mg q.8 h. 3. Coumadin 2.5 mg 1/2-1 tablet daily as prior to admission. 4. Meclizine 25 mg b.i.d.5. Fish oil b.i.d. 6. Alprazolam 0.25 mg q.6 h. p.r.n. 7. Coreg 12.5 mg is discontinued. 8. Coreg 25 mg b.i.d.  9. Sotalol 120 mg 1-1/2 tablets b.i.d. 10.Colace 100 mg daily. 11.Amlodipine 5 mg a day. 12.Crestor 5 mg a day. 13.Multivitamin daily. 14.Imdur 30 mg a day. 15.Potassium 20 mEq t.i.d. 16.Loratadine 10 mg daily p.r.n. 17.Synthroid 88 mcg daily. 18.Vitamin C OTC daily.     Theodore Demark, PA-C   ______________________________ Hillis Range, MD    RB/MEDQ  D:  12/27/2010  T:  12/27/2010  Job:  [id number]  Electronically Signed by Theodore Demark PA-C on 01/05/2011 06:29:37 AM Electronically Signed by Hillis Range MD on 01/21/2011 09:41:41 AM

## 2011-02-02 ENCOUNTER — Ambulatory Visit (INDEPENDENT_AMBULATORY_CARE_PROVIDER_SITE_OTHER): Payer: Medicare Other | Admitting: Nurse Practitioner

## 2011-02-02 ENCOUNTER — Ambulatory Visit (INDEPENDENT_AMBULATORY_CARE_PROVIDER_SITE_OTHER): Payer: Medicare Other | Admitting: *Deleted

## 2011-02-02 ENCOUNTER — Encounter: Payer: Self-pay | Admitting: Nurse Practitioner

## 2011-02-02 DIAGNOSIS — I2589 Other forms of chronic ischemic heart disease: Secondary | ICD-10-CM

## 2011-02-02 DIAGNOSIS — I472 Ventricular tachycardia, unspecified: Secondary | ICD-10-CM

## 2011-02-02 DIAGNOSIS — I10 Essential (primary) hypertension: Secondary | ICD-10-CM

## 2011-02-02 DIAGNOSIS — I255 Ischemic cardiomyopathy: Secondary | ICD-10-CM

## 2011-02-02 NOTE — Assessment & Plan Note (Signed)
Currently looks compensated.

## 2011-02-02 NOTE — Progress Notes (Signed)
[name] Date of Birth: June 08, 1940   History of Present Illness: [name] is seen today for a one month check. He is seen for Dr. Patty Sermons and Dr. Graciela Husbands. He is doing better. He has had a bout of recurrent VT that was below his detection rate. Coreg was increased. He has not had recurrence. Blood pressure initially got too low and he stopped his Norvasc. He is now back on it due to blood pressure spikes. He is not lightheaded or dizzy. He feels good. He went [place] the beach [place] holiday. No chest pain. He is tolerating his current regimen.   Current Outpatient Prescriptions on File Prior to Visit  Medication Sig Dispense Refill  . ALPRAZolam (XANAX) 0.25 MG tablet Take 1 tablet (0.25 mg total) by mouth 4 (four) times daily as needed.  360 tablet  1  . amLODipine (NORVASC) 5 MG tablet Take 5 mg by mouth daily.        . Ascorbic Acid (VITAMIN C) 500 MG tablet Take 500 mg by mouth daily.        . carvedilol (COREG) 25 MG tablet Take 1 tablet (25 mg total) by mouth 2 (two) times daily with a meal.      . DOCUSATE CALCIUM PO Take 81 mg by mouth daily.        . isosorbide mononitrate (IMDUR) 30 MG 24 hr tablet TAKE ONE TABLET DAILY  90 tablet  3  . levothyroxine (SYNTHROID, LEVOTHROID) 88 MCG tablet Take 88 mcg by mouth daily.        . Loratadine (CLARITIN) 10 MG CAPS Take by mouth daily.        [name] losartan-hydrochlorothiazide (HYZAAR) 100-25 MG per tablet TAKE ONE TABLET DAILY  90 tablet  3  . meclizine (ANTIVERT) 25 MG tablet Take 25 mg by mouth 2 (two) times daily.       [name] mexiletine (MEXITIL) 150 MG capsule TAKE 1 CAPSULE THREE TIMES A DAY  270 capsule  3  . MULTIPLE VITAMIN PO Take by mouth daily.        . Omega-3 Fatty Acids (FISH OIL) 1000 MG CAPS Take by mouth 2 (two) times daily.        . potassium chloride SA (K-DUR,KLOR-CON) 20 MEQ tablet TAKE ONE TABLET THREE TIMES DAILY  270 tablet  3  . rosuvastatin (CRESTOR) 5 MG tablet Take 5 mg by mouth every other day.          . sotalol (BETAPACE) 120 MG tablet TAKE ONE AND ONE-HALF TABLETS TWICE A DAY  270 tablet  3  . warfarin (COUMADIN) 2.5 MG tablet Take 2.5 mg by mouth as directed.        . meclizine (ANTIVERT) 25 MG tablet Take 1 tablet (25 mg total) by mouth 3 (three) times daily as needed.  270 tablet  1    Allergies  Allergen Reactions  . Iohexol      Code: HIVES, Desc: hives w/ itching during cardiac cath '99, mult caths since w/ ? premeds, DR.G.HAYES REQUESTS 13 HR PRE MED//A.C.pt okay w/13 hr prep/mms, Onset Date: [id number]   . Prednisone     Past Medical History  Diagnosis Date  . CAD (coronary artery disease) 1999    Remote anterolater and apical MI, cath 2011 patent stents RCA/LCx  LAD w/o obstruction  . Cardiomyopathy, ischemic     EF 20-25%  echo 2012  . Chronic systolic congestive heart failure   . Ventricular tachycardia  recurrent slow VT at 100-110 bpm,  Rx mexilitene/sotalol  . ICD (implantable cardiac defibrillator) in place     [place] Jude  . 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 Abdominal aortic aneurysm 2009    Repaired with endovascular stent  . PVD (peripheral vascular disease)     Has occluded innominate vein  . Left bundle branch block     Past Surgical History  Procedure Date  . Icd Feb 2012    Change out with LV lead placed with tunneling from the right  to the left side  . Endovascular stent insertion     for AAA  . Cardiac catheterization     x2    History  Smoking status  . Former Smoker  Smokeless tobacco  . Former Neurosurgeon  . Quit date: 06/01/1987    History  Alcohol Use No    Family History  Problem Relation Age of Onset  . Stroke Mother   . Heart attack Father     Review of Systems: The review of systems is positive for questionable bite of his right middle finger. It was very swollen but resolved overnight.  All other systems were reviewed and are negative.  Physical Exam: BP 112/80  Pulse 62  Ht 5\' 8"  (1.727 m)  Wt 186 lb (84.369 kg)   BMI 28.28 kg/m2 Patient is very pleasant and in no acute distress. Skin is warm and dry. Color is normal.  HEENT is unremarkable. Normocephalic/atraumatic. PERRL. Sclera are nonicteric. Neck is supple. No masses. No JVD. Lungs are clear. Cardiac exam shows a regular rate and rhythm. Abdomen is soft. Extremities are without edema. Gait and ROM are intact. No gross neurologic deficits noted.  LABORATORY DATA:   Assessment / Plan:

## 2011-02-02 NOTE — Assessment & Plan Note (Signed)
No recurrence with his current medical therapy.

## 2011-02-02 NOTE — Assessment & Plan Note (Signed)
He is tolerating his current regimen. He will continue to monitor at home. We will see him back in about 2 to 3 months. Patient is agreeable to this plan and will call if any problems develop in the interim.

## 2011-02-02 NOTE — Patient Instructions (Addendum)
Continue your same dose of Coumadin. We will recheck your level in 4 weeks. Stay on your current medicines We will see you in 3 months. Call for any problems.

## 2011-02-07 ENCOUNTER — Other Ambulatory Visit: Payer: Self-pay | Admitting: Cardiology

## 2011-02-07 DIAGNOSIS — I255 Ischemic cardiomyopathy: Secondary | ICD-10-CM

## 2011-02-07 DIAGNOSIS — I472 Ventricular tachycardia, unspecified: Secondary | ICD-10-CM

## 2011-02-07 DIAGNOSIS — Z95810 Presence of automatic (implantable) cardiac defibrillator: Secondary | ICD-10-CM

## 2011-02-07 DIAGNOSIS — R001 Bradycardia, unspecified: Secondary | ICD-10-CM

## 2011-02-07 DIAGNOSIS — I5022 Chronic systolic (congestive) heart failure: Secondary | ICD-10-CM

## 2011-02-09 MED ORDER — CARVEDILOL 25 MG PO TABS: 25.0000 mg | ORAL_TABLET | Freq: Two times a day (BID) | ORAL | Status: DC

## 2011-02-09 NOTE — Telephone Encounter (Signed)
Made dose change

## 2011-02-11 ENCOUNTER — Inpatient Hospital Stay (HOSPITAL_COMMUNITY)
Admission: EM | Admit: 2011-02-11 | Discharge: 2011-02-19 | DRG: 251 | Disposition: A | Discharge status: Home / Self Care | Payer: Medicare Other | Attending: Internal Medicine | Admitting: Internal Medicine

## 2011-02-11 ENCOUNTER — Emergency Department (HOSPITAL_COMMUNITY): Payer: Medicare Other

## 2011-02-11 DIAGNOSIS — I472 Ventricular tachycardia, unspecified: Secondary | ICD-10-CM

## 2011-02-11 DIAGNOSIS — E861 Hypovolemia: Secondary | ICD-10-CM | POA: Diagnosis present

## 2011-02-11 DIAGNOSIS — T462X5A Adverse effect of other antidysrhythmic drugs, initial encounter: Secondary | ICD-10-CM | POA: Diagnosis present

## 2011-02-11 DIAGNOSIS — I5022 Chronic systolic (congestive) heart failure: Secondary | ICD-10-CM | POA: Diagnosis present

## 2011-02-11 DIAGNOSIS — I509 Heart failure, unspecified: Secondary | ICD-10-CM | POA: Diagnosis present

## 2011-02-11 DIAGNOSIS — I2589 Other forms of chronic ischemic heart disease: Secondary | ICD-10-CM | POA: Diagnosis present

## 2011-02-11 DIAGNOSIS — E039 Hypothyroidism, unspecified: Secondary | ICD-10-CM | POA: Diagnosis present

## 2011-02-11 DIAGNOSIS — Z7901 Long term (current) use of anticoagulants: Secondary | ICD-10-CM

## 2011-02-11 DIAGNOSIS — Z86718 Personal history of other venous thrombosis and embolism: Secondary | ICD-10-CM

## 2011-02-11 DIAGNOSIS — I739 Peripheral vascular disease, unspecified: Secondary | ICD-10-CM | POA: Diagnosis present

## 2011-02-11 DIAGNOSIS — Z95810 Presence of automatic (implantable) cardiac defibrillator: Secondary | ICD-10-CM

## 2011-02-11 DIAGNOSIS — I771 Stricture of artery: Secondary | ICD-10-CM | POA: Diagnosis present

## 2011-02-11 DIAGNOSIS — I4729 Other ventricular tachycardia: Principal | ICD-10-CM | POA: Diagnosis present

## 2011-02-11 DIAGNOSIS — I2510 Atherosclerotic heart disease of native coronary artery without angina pectoris: Secondary | ICD-10-CM | POA: Diagnosis present

## 2011-02-11 LAB — CBC
HCT: 39.5 % (ref 39.0–52.0)
MCV: 91.4 fL (ref 78.0–100.0)
RBC: 4.32 MIL/uL (ref 4.22–5.81)
WBC: 7.7 10*3/uL (ref 4.0–10.5)

## 2011-02-11 LAB — DIFFERENTIAL
Eosinophils Relative: 3 % (ref 0–5)
Lymphocytes Relative: 23 % (ref 12–46)
Lymphs Abs: 1.8 10*3/uL (ref 0.7–4.0)
Neutrophils Relative %: 61 % (ref 43–77)

## 2011-02-11 LAB — BASIC METABOLIC PANEL
BUN: 15 mg/dL (ref 6–23)
CO2: 30 mEq/L (ref 19–32)
Chloride: 101 mEq/L (ref 96–112)
Creatinine, Ser: 1.06 mg/dL (ref 0.50–1.35)
Glucose, Bld: 125 mg/dL — ABNORMAL HIGH (ref 70–99)

## 2011-02-11 LAB — POCT I-STAT TROPONIN I

## 2011-02-12 DIAGNOSIS — I4901 Ventricular fibrillation: Secondary | ICD-10-CM

## 2011-02-12 DIAGNOSIS — I472 Ventricular tachycardia: Secondary | ICD-10-CM

## 2011-02-12 LAB — POCT I-STAT EG7
Calcium, Ion: 1.13 mmol/L (ref 1.12–1.32)
HCT: 41 % (ref 39.0–52.0)
O2 Saturation: 35 %
pCO2, Ven: 49.6 mmHg (ref 45.0–50.0)
pH, Ven: 7.336 — ABNORMAL HIGH (ref 7.250–7.300)

## 2011-02-12 LAB — MRSA PCR SCREENING: MRSA by PCR: NEGATIVE

## 2011-02-12 LAB — PROTIME-INR
INR: 2.91 — ABNORMAL HIGH (ref 0.00–1.49)
Prothrombin Time: 30.9 seconds — ABNORMAL HIGH (ref 11.6–15.2)

## 2011-02-12 LAB — POCT ACTIVATED CLOTTING TIME: Activated Clotting Time: 232 seconds

## 2011-02-13 LAB — BASIC METABOLIC PANEL
BUN: 14 mg/dL (ref 6–23)
CO2: 28 mEq/L (ref 19–32)
GFR calc non Af Amer: >60 mL/min (ref >60)
Glucose, Bld: 98 mg/dL (ref 70–99)
Potassium: 3.6 mEq/L (ref 3.5–5.1)

## 2011-02-13 LAB — CBC
HCT: 37.8 % — ABNORMAL LOW (ref 39.0–52.0)
Hemoglobin: 12.8 g/dL — ABNORMAL LOW (ref 13.0–17.0)
MCHC: 33.9 g/dL (ref 30.0–36.0)

## 2011-02-13 LAB — TSH: TSH: 1.992 u[IU]/mL (ref 0.350–4.500)

## 2011-02-14 LAB — MAGNESIUM: Magnesium: 2.2 mg/dL (ref 1.5–2.5)

## 2011-02-14 LAB — PROTIME-INR
INR: 2.7 — ABNORMAL HIGH (ref 0.00–1.49)
Prothrombin Time: 29.1 seconds — ABNORMAL HIGH (ref 11.6–15.2)

## 2011-02-14 NOTE — Op Note (Signed)
  NAMEROHAAN, DURNIL NO.:  [id number]  MEDICAL RECORD NO.:  [id number]  LOCATION:  2913                         FACILITY:  [facility]  PHYSICIAN:  Armanda Magic, M.D.     DATE OF BIRTH:  [birth year]  DATE OF PROCEDURE:  02/14/2011 DATE OF DISCHARGE:                              OPERATIVE REPORT   PROCEDURE:  Direct current cardioversion.  OPERATOR:  Armanda Magic, MD  INDICATIONS:  Ventricular tachycardia storm.  COMPLICATIONS:  None.  IV MEDICATIONS:  Propofol 70 mg IV.  COMPLICATIONS:  None.  The patient developed incessant ventricular tachycardia storm this evening.  He has been given 2 doses of IV amiodarone boluses and started on drip at 1 mg per minute for the past 4 hours and continues with ventricular tachycardia at 140 beats per minute.  His systolic blood pressure is maintain at 95-100 mmHg systolic with 2 saline bolus.  The pacemaker rep was contacted and we tried to pace the patient out of ventricular tachycardia successfully.  The patient then received a 15- joule shock which shocked into sinus rhythm for 2 beats and then he reverted back to ventricular tachycardia.  He was then given lidocaine bolus of 1 mg/kg and then after adequate anesthesia was obtained, a 200- joule synchronized biphasic shock was delivered which converted the patient to a paced rhythm, but immediately he reverted back to ventricular tachycardia.  He was shocked again at 200 joules with a synchronized biphasic shock and converted to a paced rhythm.  AICD was set for dual-chamber pacing at 85 beats per minute to try to prevent further ventricular tachycardia.  The patient tolerated procedure well without complications.  ASSESSMENT: 1. Ventricular tachycardia storm. 2. Successful cardioversion to AV paced rhythm.  PLAN:  We will given another amiodarone bolus of 150 mg IV and continue his amiodarone drip.  Further workup per Dr. Lewayne Bunting.    Armanda Magic,  M.D.    TT/MEDQ  D:  02/14/2011  T:  02/14/2011  Job:  [id number]  Electronically Signed by Armanda Magic M.D. on 02/14/2011 04:54:09 PM

## 2011-02-15 ENCOUNTER — Ambulatory Visit: Payer: Self-pay | Admitting: Surgery

## 2011-02-15 DIAGNOSIS — I5022 Chronic systolic (congestive) heart failure: Secondary | ICD-10-CM

## 2011-02-15 LAB — BASIC METABOLIC PANEL
BUN: 14 mg/dL (ref 6–23)
BUN: 12 mg/dL (ref 6–23)
Calcium: 8.6 mg/dL (ref 8.4–10.5)
Calcium: 8.5 mg/dL (ref 8.4–10.5)
Creatinine, Ser: 0.92 mg/dL (ref 0.50–1.35)
Creatinine, Ser: 0.9 mg/dL (ref 0.50–1.35)
GFR calc Af Amer: >60 mL/min (ref >60)
GFR calc Af Amer: >60 mL/min (ref >60)
GFR calc non Af Amer: >60 mL/min (ref >60)
GFR calc non Af Amer: >60 mL/min (ref >60)
Potassium: 3.2 mEq/L — ABNORMAL LOW (ref 3.5–5.1)

## 2011-02-15 LAB — PROTIME-INR: Prothrombin Time: 31.1 seconds — ABNORMAL HIGH (ref 11.6–15.2)

## 2011-02-16 LAB — BASIC METABOLIC PANEL
BUN: 12 mg/dL (ref 6–23)
Creatinine, Ser: 0.98 mg/dL (ref 0.50–1.35)
GFR calc Af Amer: >60 mL/min (ref >60)
GFR calc non Af Amer: >60 mL/min (ref >60)
Glucose, Bld: 109 mg/dL — ABNORMAL HIGH (ref 70–99)

## 2011-02-16 LAB — PROTIME-INR: Prothrombin Time: 33.6 seconds — ABNORMAL HIGH (ref 11.6–15.2)

## 2011-02-17 LAB — BASIC METABOLIC PANEL
BUN: 15 mg/dL (ref 6–23)
Creatinine, Ser: 1.09 mg/dL (ref 0.50–1.35)
GFR calc Af Amer: >60 mL/min (ref >60)
GFR calc non Af Amer: >60 mL/min (ref >60)
Potassium: 3.4 mEq/L — ABNORMAL LOW (ref 3.5–5.1)

## 2011-02-19 LAB — CBC
MCH: 31.4 pg (ref 26.0–34.0)
MCHC: 33.9 g/dL (ref 30.0–36.0)
MCV: 92.8 fL (ref 78.0–100.0)
Platelets: 275 10*3/uL (ref 150–400)
RDW: 12.9 % (ref 11.5–15.5)

## 2011-02-19 LAB — COMPREHENSIVE METABOLIC PANEL
BUN: 15 mg/dL (ref 6–23)
Calcium: 8.9 mg/dL (ref 8.4–10.5)
GFR calc Af Amer: >60 mL/min (ref >60)
Glucose, Bld: 103 mg/dL — ABNORMAL HIGH (ref 70–99)
Total Protein: 6.4 g/dL (ref 6.0–8.3)

## 2011-02-19 NOTE — Discharge Summary (Addendum)
NAMEMarland Kitchen  [name], [name] NO.:  [id number]  MEDICAL RECORD NO.:  [id number]  LOCATION:  2006                         FACILITY:  [facility]  PHYSICIAN:  Cassell Clement, M.D. DATE OF BIRTH:  [birth year]  DATE OF ADMISSION:  02/11/2011 DATE OF DISCHARGE:  02/19/2011                              DISCHARGE SUMMARY   DISCHARGE DIAGNOSES: 1. Recurrent ventricular tachycardia.     a.     Status post ventricular tachycardia ablation on February 12, 2011, with recurrent ventricular tachycardia, placed on      amiodarone this admission.     b.     Status post multiple implantable cardioverter-defibrillator      shocks.     c.     History of VT storm, previously developed hypothyroidism      secondary to amiodarone.     d.     Sotalol was discontinued this admission, continued on      mexiletine. 2. Coronary artery disease.     a.     Status post myocardial infarction in 1999 with percutaneous      transluminal coronary angioplasty.     b.     Status post stent to the right coronary artery and      circumflex in 2005.     c.     Last cath in 2010 showing patent stents in the circumflex      and right coronary artery. 3. Ischemic cardiomyopathy with ejection fraction of 25-30% by echo in     February 2012, status post [employer] ICD placed in 2004 with upgrade     to CRT in March 2012. 4. Abdominal aortic aneurysm repair in 2009. 5. Hyperlipidemia. 6. Amiodarone-induced hypothyroidism with normal TSH this admission,     for recheck in 3 weeks with endocrinologist. 7. 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  HOSPITAL COURSE:  [name] is a 70 year old gentleman with a history of CAD, ischemic cardiomyopathy and VT, presented with multiple ICD discharges.  ICD was interrogated showing nonappropriate episodes of VT and appropriate therapy.  Prior to that he had not been having any problems.  He denied any chest pain, shortness of breath  or changes in symptoms.  He was admitted to the hospital, initially Coumadin was held for tentative planing for VT ablation.  Initial cardiac markers showed an increase in troponin to 0.24.  Dr. Johney Frame saw the patient and felt that VT ablation was the next status which was performed on September 14.  Unfortunately, postprocedurally on September 16th, the patient with recurrent VT with ICD discharge and required cardioversion.  He was slow with multiple times with amiodarone and eventually placed on IV lidocaine.  Dr. Johney Frame review the EKGs and felt the recurrent VT was likely to be VT#4 from the EP study and agreed with Dr. Ladona Ridgel that amiodarone was the most prudent strategies at that time.  His Coreg was up titrated to 37.5 mg b.i.d.  Sotalol was discontinued.  IV amiodarone was converted to p.o. amiodarone.  His ARB was resumed and spironolactone was added.  The patient was observed on telemetry and his Coumadin level was also followed  given the addition of amiodarone.  On day of discharge, the patient is feeling better.  His rhythm was stable on AVP without ectopy.  Dr. Patty Sermons has seen and examined him today and feels he is stable for discharge.  DISCHARGE LABS:  WBC 7.8, hemoglobin 12.2, hematocrit 36, platelet count 275, INR 1.99, sodium 141, potassium 3.8, chloride 104, CO2 28, glucose 103, BUN 15, creatinine 1.21.  LFTs were normal with exception of albumin 3.0.  TSH 1.992.  Initial cardiac markers showing CK 122, MB 6.8, relative index 5.6 and troponin 0.24.  STUDIES:  Chest x-ray on February 11, 2011, showed minimal left basilar atelectasis.  Lungs otherwise clear.  Borderline cardiomegaly noted. Pacer/AICD noted overlying appropriate position.  LAD stent to cross the superior aspect of the hemithorax towards bending into the mediastinum.  DISCHARGE MEDICATIONS: 1. Amiodarone 200 mg, per discussion with Dr. Patty Sermons, the patient     will take 2 tablets q.8 h through  Sunday September 23 and decreased     to 2 tablets every 12 hours. 2. Aspirin 81 mg daily with instructions to stop if he develops     recurrent nose bleeds. 3. Losartan 50 mg daily. 4. Nitroglycerin sublingual 0.4 mg every 5 minutes as needed up to 3     doses for chest pain. 5. Spironolactone 25 mg half tablet daily. 6. Coumadin 2.5 mg daily for now, with INR check as below. 7. Coreg 25 mg one half tablet b.i.d. 8. Potassium chloride 20 mEq daily. 9. Alprazolam 0.25 mg half to 1 tablet q.6 h. p.r.n. anxiety. 10.Crestor 5 mg every other day. 11.Docusate 100 mg daily as needed for stool softener. 12.Fish oil 1000 mg b.i.d. 13.Imdur 30 mg every evening. 14.Loratadine 10 mg daily as needed. 15.Meclizine 25 mg b.i.d. 16.Mexiletine 150 mg daily q.8 hours. 17.Multivitamin 1 tablet daily. 18.Synthroid 88 mcg daily. 19.Vitamin C 1 tablet daily.  DISPOSITION:  [name] will be discharged in stable condition to home.  He is to increase activity slowly, not to drive.  He should follow a low-sodium heart-healthy diet.  Keep his IV site clean and dry. He will have his INR checked on February 23, 2011, and follow at 3:45 p.m. and prior to that have a BMET to check his potassium and electrolytes.  He is to follow up with his endocrinologist for repeat the thyroid function studies in approximately 3 weeks given the addition of amiodarone with his prior history of thyroid function.  He will follow up with Dr. Graciela Husbands on March 04, 2011, at 12:30 p.m. and then follow up with Dr. Patty Sermons on April 06, 2011, at 11:30 p.m.  DURATION OF DISCHARGE ENCOUNTER:  One hour including physician and PA time.     Ronie Spies, P.A.C.   ______________________________ Cassell Clement, M.D.    DD/MEDQ  D:  02/19/2011  T:  02/19/2011  Job:  [id number]  cc:   [name], MD, [facility] Tera Mater. Evlyn Kanner, M.D.  Electronically Signed by Cassell Clement M.D. on 02/19/2011 12:56:34 PM Electronically  Signed by Ronie Spies  on 02/25/2011 07:08:03 PM

## 2011-02-23 ENCOUNTER — Ambulatory Visit (INDEPENDENT_AMBULATORY_CARE_PROVIDER_SITE_OTHER): Payer: Medicare Other | Admitting: *Deleted

## 2011-02-23 DIAGNOSIS — I509 Heart failure, unspecified: Secondary | ICD-10-CM

## 2011-02-23 DIAGNOSIS — I472 Ventricular tachycardia: Secondary | ICD-10-CM

## 2011-02-23 DIAGNOSIS — I498 Other specified cardiac arrhythmias: Secondary | ICD-10-CM

## 2011-02-23 DIAGNOSIS — I255 Ischemic cardiomyopathy: Secondary | ICD-10-CM

## 2011-02-23 DIAGNOSIS — I10 Essential (primary) hypertension: Secondary | ICD-10-CM

## 2011-02-23 DIAGNOSIS — I2589 Other forms of chronic ischemic heart disease: Secondary | ICD-10-CM

## 2011-02-23 DIAGNOSIS — R001 Bradycardia, unspecified: Secondary | ICD-10-CM

## 2011-02-23 DIAGNOSIS — Z95810 Presence of automatic (implantable) cardiac defibrillator: Secondary | ICD-10-CM

## 2011-02-23 DIAGNOSIS — I5022 Chronic systolic (congestive) heart failure: Secondary | ICD-10-CM

## 2011-02-23 LAB — POCT INR: INR: 4.6

## 2011-02-23 MED ORDER — WARFARIN SODIUM 2 MG PO TABS: ORAL_TABLET | ORAL | Status: DC

## 2011-02-24 LAB — BASIC METABOLIC PANEL
BUN: 18 mg/dL (ref 6–23)
Calcium: 9.4 mg/dL (ref 8.4–10.5)
Chloride: 105 mEq/L (ref 96–112)
Potassium: 5.4 mEq/L — ABNORMAL HIGH (ref 3.5–5.1)

## 2011-02-24 MED ORDER — CARVEDILOL 25 MG PO TABS: ORAL_TABLET | ORAL | Status: DC

## 2011-02-24 MED ORDER — AMIODARONE HCL 200 MG PO TABS: 400.0000 mg | ORAL_TABLET | Freq: Two times a day (BID) | ORAL | Status: DC

## 2011-02-24 MED ORDER — LOSARTAN POTASSIUM 50 MG PO TABS: 50.0000 mg | ORAL_TABLET | Freq: Every day | ORAL | Status: DC

## 2011-02-24 MED ORDER — SPIRONOLACTONE 25 MG PO TABS: 12.5000 mg | ORAL_TABLET | Freq: Every day | ORAL | Status: DC

## 2011-02-24 MED ORDER — POTASSIUM CHLORIDE CRYS ER 20 MEQ PO TBCR: 20.0000 meq | EXTENDED_RELEASE_TABLET | Freq: Every day | ORAL | Status: DC

## 2011-02-24 NOTE — H&P (Signed)
NAMEMarland Kitchen  RUSHI, CHASEN NO.:  [id number]  MEDICAL RECORD NO.:  [id number]  LOCATION:  [facility]                         FACILITY:  [facility]  PHYSICIAN:  Harlon Flor, MD   DATE OF BIRTH:  01-26-1941  DATE OF ADMISSION:  02/11/2011 DATE OF DISCHARGE:                             HISTORY & PHYSICAL   ADMISSION DIAGNOSIS:  Ventricular tachycardia.  CHIEF COMPLAINT:  ICD fire.  HISTORY OF PRESENT ILLNESS:  [name] is a 70 year old white male with ischemic cardiomyopathy and an EF 25%, coronary artery disease with abdominal aortic aneurysm who comes to the ER today after ICD fire x9 tonight.  His ICD was interrogated and showed 9 appropriate episodes of ventricular tachycardia and appropriate therapy.  Most of the tachycardia with a cycle length of 425 at 141 beats per minute and some with a cycle length of 540 milliseconds or 111 beats per minute. ATP was unsuccessful during all these episodes.  Prior to this, he had not been having any problems.  He denies any chest pain or change in his shortness of breath, orthopnea or PND.  He has stable New York Heart Association class II symptoms.  He has not had a recent MI.  PAST MEDICAL HISTORY: 1. Ischemic cardiomyopathy:  MI in 1991 status post PTCA with cath in     2005, and again in 2010, showing a patent stent in the circumflex     and the RCA but EF of 35%.  He had an AICD upgraded to CRT in March     2012. 2. Ventricular tachycardia with VT storm treated with mexiletine and     sotalol. 3. Abdominal aortic aneurysm repair in 2009. 4. Hyperlipidemia. 5. Amiodarone-induced hypothyroidism. 6. 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  ALLERGIES:  PREDNISONE, IV DYE, ZOCOR, AMIODARONE.  HOME MEDICATIONS: 1. Coreg 25 mg b.i.d. 2. Amlodipine 5 mg daily. 3. Crestor 5 mg daily. 4. Hyzaar 100/25 daily. 5. Imdur 30 mg daily. 6. Meclizine 25 b.i.d. 7. Mexiletine 150 mg t.i.d. 8. Sotalol  180 mg b.i.d. 9. Synthroid 88 mcg daily. 10.Coumadin 2.5 mg daily except for Wednesdays when he takes 5 mg. 11.Xanax 0.25 mg every 6 hours as needed for anxiety.  FAMILY HISTORY:  There is no early coronary artery disease.  SOCIAL HISTORY:  He no longer smokes and he lives at home with his wife. She is currently in [place].  REVIEW OF SYSTEMS:  Full review of systems is obtained is negative except as described in the HPI.  PHYSICAL EXAMINATION:  VITAL SIGNS:  Blood pressure 127/73, pulse 78, temperature 99.0, respirations 16. GENERAL:  No acute stress. HEENT:  Extraocular muscles intact.  Oropharynx benign.  Nonicteric sclerae. NECK:  Supple. CARDIOVASCULAR:  Regular rate and rhythm with splitting of S2.  No obvious murmurs.  Jugular venous pressure normal. LUNGS:  Clear. ABDOMEN:  Soft, nontender, nondistended. EXTREMITIES:  No clubbing, cyanosis or edema.  Marginally perfused. Pulses are intact throughout. NEURO:  Grossly afocal.  Moves all extremities and alert and oriented x3.  LABORATORY DATA AND DIAGNOSTICS:  White count 7.7, hemoglobin 14, platelets 250.  BUN 15, creatinine 1.06, CK-MB 6.8, troponin 0.24. Chest x-ray is  clear, shows biventricular ICD.  His EKG shows normal sinus rhythm with biventricular pacing.  ASSESSMENT AND PLAN:  [name] is a 70 year old white male with ischemic cardiomyopathy and ventricular tachycardia storm. 1. Ventricular tachycardia storm:  He has monomorphic ventricular     tachycardia two to three different ventricular tachycardias with a     cycle length ranging from 425 milliseconds to 540 milliseconds.  He     has not had any symptoms of myocardial infarction.  We will plan to     rule him out with serial biomarkers.  We will also check his INR as     he is on Coumadin and we will hold his Coumadin.  He received his     mexiletine tonight but did not receive his sotalol.  We will hold     these tentatively planning for a  ventricular tachycardia ablation     tomorrow if his INR is okay.  If he has recurrent ventricular     tachycardia, then I will use lidocaine. 2. Ischemic cardiomyopathy:  He is euvolemic and marginally perfused.     We will continue his home medications without any change.     Harlon Flor, MD     MMB/MEDQ  D:  02/12/2011  T:  02/12/2011  Job:  [id number]  Electronically Signed by Meridee Score MD on 02/24/2011 05:46:28 AM

## 2011-02-25 ENCOUNTER — Telehealth: Payer: Self-pay | Admitting: *Deleted

## 2011-02-25 LAB — POCT I-STAT 7, (LYTES, BLD GAS, ICA,H+H)
Acid-Base Excess: 3 — ABNORMAL HIGH
Bicarbonate: 26.6 — ABNORMAL HIGH
HCT: 31 — ABNORMAL LOW
Operator id: 230421
Sodium: 140

## 2011-02-25 LAB — URINALYSIS, ROUTINE W REFLEX MICROSCOPIC
Glucose, UA: NEGATIVE
Hgb urine dipstick: NEGATIVE
Specific Gravity, Urine: 1.007
Urobilinogen, UA: 0.2
pH: 6

## 2011-02-25 LAB — CBC
HCT: 30.4 — ABNORMAL LOW
HCT: 35 — ABNORMAL LOW
HCT: 41.5
Hemoglobin: 10.8 — ABNORMAL LOW
Hemoglobin: 12 — ABNORMAL LOW
Hemoglobin: 12 — ABNORMAL LOW
MCHC: 34
MCHC: 34.1
MCHC: 34.3
MCHC: 35.6
MCV: 93.8
MCV: 94.4
RBC: 3.27 — ABNORMAL LOW
RBC: 3.74 — ABNORMAL LOW
RBC: 4.42
RDW: 12.8
RDW: 13.1
WBC: 6.9

## 2011-02-25 LAB — BASIC METABOLIC PANEL
CO2: 26
CO2: 26
CO2: 30
Chloride: 107
Chloride: 107
Creatinine, Ser: 0.99
GFR calc Af Amer: >60
GFR calc non Af Amer: 59 — ABNORMAL LOW
Glucose, Bld: 100 — ABNORMAL HIGH
Glucose, Bld: 105 — ABNORMAL HIGH
Potassium: 4
Potassium: 4.1
Sodium: 137
Sodium: 140

## 2011-02-25 LAB — PROTIME-INR
INR: 1.2
Prothrombin Time: 15.8 — ABNORMAL HIGH

## 2011-02-25 LAB — COMPREHENSIVE METABOLIC PANEL
BUN: 14
CO2: 22
Calcium: 9.2
Chloride: 106
Creatinine, Ser: 1.04
GFR calc non Af Amer: >60
Total Bilirubin: 0.9

## 2011-02-25 LAB — BLOOD GAS, ARTERIAL
Acid-Base Excess: 0.3
O2 Saturation: 94.1
TCO2: 25.7
pCO2 arterial: 39.4
pO2, Arterial: 71.3 — ABNORMAL LOW

## 2011-02-25 LAB — TYPE AND SCREEN
ABO/RH(D): A POS
Antibody Screen: NEGATIVE

## 2011-02-25 LAB — ABO/RH: ABO/RH(D): A POS

## 2011-02-25 LAB — APTT: aPTT: 30

## 2011-02-25 NOTE — Telephone Encounter (Signed)
Message copied by Eugenia Pancoast on Thu Feb 25, 2011  9:16 AM ------      Message from: Cassell Clement      Created: Wed Feb 24, 2011  8:29 PM       Please report.  Potassium is now too high so decrease Kdur to just MWF

## 2011-02-25 NOTE — Telephone Encounter (Signed)
Advised of labs and mailed copy 

## 2011-02-26 LAB — CBC
HCT: 38.4 — ABNORMAL LOW
MCHC: 33.8
Platelets: 285
RDW: 13.1

## 2011-02-26 LAB — COMPREHENSIVE METABOLIC PANEL
Albumin: 3.6
BUN: 16
Creatinine, Ser: 1.18
Glucose, Bld: 147 — ABNORMAL HIGH
Total Protein: 7

## 2011-02-26 LAB — TSH: TSH: 1.756

## 2011-02-26 LAB — T4: T4, Total: 9.7

## 2011-02-26 LAB — PROTIME-INR
INR: 2 — ABNORMAL HIGH
INR: 2.1 — ABNORMAL HIGH
INR: 2.3 — ABNORMAL HIGH
Prothrombin Time: 24.6 — ABNORMAL HIGH

## 2011-02-26 LAB — APTT: aPTT: 36

## 2011-02-26 LAB — BASIC METABOLIC PANEL
BUN: 17
CO2: 23
Calcium: 8.9
Creatinine, Ser: 1.03
GFR calc Af Amer: >60
GFR calc non Af Amer: 53 — ABNORMAL LOW
GFR calc non Af Amer: >60
Glucose, Bld: 180 — ABNORMAL HIGH
Potassium: 4.3
Sodium: 138

## 2011-02-26 LAB — CK TOTAL AND CKMB (NOT AT ARMC)
CK, MB: 11.2 — ABNORMAL HIGH
CK, MB: 13 — ABNORMAL HIGH
CK, MB: 8.1 — ABNORMAL HIGH
Relative Index: 10.4 — ABNORMAL HIGH
Relative Index: 3.6 — ABNORMAL HIGH
Relative Index: 5.9 — ABNORMAL HIGH
Total CK: 125
Total CK: 189

## 2011-02-26 LAB — DIFFERENTIAL
Basophils Absolute: 0
Basophils Relative: 0
Eosinophils Absolute: 0
Eosinophils Relative: 0
Lymphocytes Relative: 9 — ABNORMAL LOW
Monocytes Absolute: 0 — ABNORMAL LOW

## 2011-02-26 LAB — TROPONIN I
Troponin I: 0.71
Troponin I: 0.79

## 2011-02-26 LAB — B-NATRIURETIC PEPTIDE (CONVERTED LAB)
Pro B Natriuretic peptide (BNP): 569 — ABNORMAL HIGH
Pro B Natriuretic peptide (BNP): 571 — ABNORMAL HIGH

## 2011-02-26 LAB — LIPID PANEL
Cholesterol: 171
LDL Cholesterol: 115 — ABNORMAL HIGH

## 2011-02-26 LAB — POCT CARDIAC MARKERS: Operator id: 133351

## 2011-03-04 ENCOUNTER — Encounter: Payer: Self-pay | Admitting: Internal Medicine

## 2011-03-04 ENCOUNTER — Ambulatory Visit (INDEPENDENT_AMBULATORY_CARE_PROVIDER_SITE_OTHER): Payer: Medicare Other | Admitting: Internal Medicine

## 2011-03-04 ENCOUNTER — Ambulatory Visit (INDEPENDENT_AMBULATORY_CARE_PROVIDER_SITE_OTHER): Payer: Medicare Other | Admitting: *Deleted

## 2011-03-04 DIAGNOSIS — I5022 Chronic systolic (congestive) heart failure: Secondary | ICD-10-CM

## 2011-03-04 DIAGNOSIS — I10 Essential (primary) hypertension: Secondary | ICD-10-CM

## 2011-03-04 DIAGNOSIS — Z95810 Presence of automatic (implantable) cardiac defibrillator: Secondary | ICD-10-CM

## 2011-03-04 DIAGNOSIS — I2589 Other forms of chronic ischemic heart disease: Secondary | ICD-10-CM

## 2011-03-04 DIAGNOSIS — I255 Ischemic cardiomyopathy: Secondary | ICD-10-CM

## 2011-03-04 DIAGNOSIS — R001 Bradycardia, unspecified: Secondary | ICD-10-CM

## 2011-03-04 DIAGNOSIS — I509 Heart failure, unspecified: Secondary | ICD-10-CM

## 2011-03-04 DIAGNOSIS — I498 Other specified cardiac arrhythmias: Secondary | ICD-10-CM

## 2011-03-04 DIAGNOSIS — I472 Ventricular tachycardia: Secondary | ICD-10-CM

## 2011-03-04 LAB — ICD DEVICE OBSERVATION
AL AMPLITUDE: 3.3 mv
AL IMPEDENCE ICD: 512.5 Ohm
AL THRESHOLD: 2.5 V
ATRIAL PACING ICD: 98 pct
CHARGE TIME: 9.8 s
DEVICE MODEL ICD: 828324
FVT: 0
HV IMPEDENCE: 47 Ohm
LV LEAD IMPEDENCE ICD: 950 Ohm
PACEART VT: 0
RV LEAD AMPLITUDE: 12 mv
TOT-0009: 1
TOT-0010: 10

## 2011-03-04 LAB — BASIC METABOLIC PANEL
BUN: 16 mg/dL (ref 6–23)
CO2: 28 mEq/L (ref 19–32)
Chloride: 105 mEq/L (ref 96–112)
Creatinine, Ser: 1.4 mg/dL (ref 0.4–1.5)

## 2011-03-04 LAB — CBC WITH DIFFERENTIAL/PLATELET
Basophils Relative: 0.7 % (ref 0.0–3.0)
Eosinophils Absolute: 0 10*3/uL (ref 0.0–0.7)
MCHC: 33 g/dL (ref 30.0–36.0)
MCV: 97.5 fl (ref 78.0–100.0)
Monocytes Absolute: 0.8 10*3/uL (ref 0.1–1.0)
Neutrophils Relative %: 67.2 % (ref 43.0–77.0)
Platelets: 317 10*3/uL (ref 150.0–400.0)

## 2011-03-04 NOTE — Assessment & Plan Note (Signed)
The patient's device was interrogated.  The information was reviewed. No changes were made in the programming.    

## 2011-03-04 NOTE — Op Note (Signed)
NAMEMarland Kitchen  [name], [name] NO.:  [id number]  MEDICAL RECORD NO.:  [id number]  LOCATION:                                 FACILITY:  PHYSICIAN:  Hillis Range, MD       DATE OF BIRTH:  [birth year]  DATE OF PROCEDURE: DATE OF DISCHARGE:                              OPERATIVE REPORT   SURGEON:  Hillis Range, MD  PREPROCEDURE DIAGNOSIS:  Ventricular tachycardia.  POSTPROCEDURE DIAGNOSIS:  Ischemic ventricular tachycardia.  PROCEDURES: 1. Comprehensive electrophysiology study. 2. Left ventricular pacing and recording. 3. 3-D mapping of ventricular tachycardia. 4. Ablation of ventricular tachycardia. 5. Arterial blood pressure monitoring. 6. Biventricular implantable cardioverter-defibrillator interrogation     and reprogramming. 7. Emergent cardioversion.  INTRODUCTION:  [name] is a very pleasant 70 year old gentleman with a known ischemic cardiomyopathy and prior myocardial infarction. He has had recurrent symptomatic ventricular tachycardia resulting in multiple ICD shocks therapies delivered.  He has failed medical therapy with amiodarone, sotalol, and mexiletine.  He presents today for EP study and radiofrequency ablation emergently as he now has developed sustained and refractory ventricular tachycardia with a cycle length of 430 milliseconds.  DESCRIPTION OF PROCEDURE:  Informed written consent was obtained and the patient was brought to the Electrophysiology Lab in the fasting state. He was adequately sedated with intravenous medications as outlined in the anesthesia report.  His right groin was prepped and draped in the usual sterile fashion by the EP Lab staff.  His ICD was interrogated and ICD shocks therapies were programed off.  He was reprogramed to VVI at 40 beats per minute.  A coronary sinus catheter was not used today as the patient has recently had a left ventricular lead placed and I wanted to avoid dislodgement.  Using a percutaneous  Seldinger technique, two 6- [place] and one 8-French hemostasis sheaths were placed in the right common femoral vein.  An 8-French hemostasis sheath was placed in the right common femoral artery for blood pressure monitoring.  Two 6-French quadripolar Josephson catheters were introduced through the right common femoral vein and advanced into the His bundle and right ventricular apex positions respectively.  Initially, the His catheter was repositioned into the high right atrium.  The patient presented to the Electrophysiology Lab in sustained ventricular tachycardia.  This was the patient's clinical tachycardia and was of a left bundle-branch inferior axis with a cycle length of 400 milliseconds.  A Bios[employer] Webster 3.5-mm ThermoCool ablation catheter was introduced through the right common femoral artery.  There was initially some difficulty passing the catheter up through the right iliac artery.  I therefore exchanged the 8-French groin sheath for a long sheath using a Wholey wire which easily traversed into the aorta.  The ablation catheter was then advanced through this long 8-French sheath into the left ventricle using a retrograde aortic approach.  Heparin was administered with a target ACT of greater than 300 seconds throughout the procedure.  The ablation catheter was placed into the left ventricle for recording and pacing.  Three-dimensional electroanatomical mapping was performed using [employer].  A voltage map was initially generated.  This demonstrated that the patient had a very large anterior left ventricular scar extending from  the midportion of the left ventricle down through the apex involving both the septum and lateral walls of the left ventricle.  This was a rather extensive scar.  Activation mapping during ventricular tachycardia was then performed and the tachycardia was mapped to the most basal anteroseptal portion of the scar.  In this location, a mid  diastolic potential was observed.  Entrainment mapping and pacing were performed from the left ventricle which revealed a post- pacing interval equal to the tachycardia cycle length.  Radiofrequency current was therefore delivered in this location and the tachycardia initially terminated.  The patient had transient complete heart block and then resumed AV conduction.  He then developed a spontaneous second ventricular tachycardia which was of a right bundle-branch inferior axis morphology with a cycle length of 430 milliseconds.  This tachycardia appeared to be arising from the lateral border of the anteroapical scar. While mapping the tachycardia, the patient unfortunately converted to a third ventricular tachycardia which was of a left bundle-branch inferior axis morphology with a cycle length of 320 milliseconds.  This tachycardia appeared to arise near the first tachycardia.  An additional radiofrequency current was delivered along the basal septal portion of the scar.  Tachycardia 3 terminated and the patient developed a fourth tachycardia which was of a left bundle-branch left superior axis.  The patient degenerated into ventricular fibrillation and was successfully defibrillated with a cardioversion shock delivered at unsynchronized 200 joules.  Voltage mapping was resumed within the ventricle and the patient developed a fifth ventricular tachycardia which was of a left bundle-branch superior axis with a cycle length of 300 milliseconds. While mapping this tachycardia, the patient again degenerated into ventricular flutter and again required emergent cardioversion.  At this point, the patient began having incessant tachycardia which was alternating between the previous morphologies described.  Tachycardias when stable for mapping, I elected to perform an extensive substrate modification.  I therefore delivered radiofrequency current circumferentially around the border zone of the  anteroapical scar using radiofrequency current with a target temperature of 40 degrees at 40 watts.  Upon complete encirculation and encapsulation of the scar with radiofrequency current, the patient's arrhythmias became very noticeably quiescent.  Following ablation, ventricular pacing was performed which revealed VA dissociation when pacing from the right ventricle.  Atrial pacing revealed an AV Wenckebach cycle length of 400 milliseconds. Following ablation, AH interval measured 164 milliseconds with an HV interval of 56 milliseconds.  The patient has a left bundle-branch QRS morphology chronically with a QRS duration of 192 milliseconds.  No arrhythmias were observed with atrial or ventricular pacing.  His AH interval measured 164 milliseconds with an HV interval of 56 milliseconds.  The patient was observed without any further arrhythmias. The procedure was therefore considered completed.  All catheters were removed and the sheaths were aspirated and flushed.  The patient's ICD was interrogated and therapies were programed back on at their previous settings.  The atrial, right ventricular and left ventricular lead, impedance threshold, and sensing were all stable when compared to prior to the procedure.  The patient was transferred to the recovery area for sheath removal per protocol.  There were no early apparent complications.  CONCLUSIONS: 1. Incessant ventricular tachycardia arising from an anteroseptal left     ventricular scar.  The patient had five separate ventricular     tachycardia morphologies.  His initial presenting tachycardia     appeared to arise from the basal anteroseptal portion of the scar     and was  successfully ablated in this location.  He subsequently     developed four different morphology tachycardias which alternated     and at times became unstable requiring emergent cardioversion.  I     therefore elected to perform substrate modification. 2.  Extensive substrate modification with encircling of the left     ventricular anteroapical scar using radiofrequency     current along the border zone.  The patient's ventricular     tachycardia became completely quiescent and not inducible following     ablation. 3. No early apparent complications.     Hillis Range, MD     JA/MEDQ  D:  02/12/2011  T:  02/13/2011  Job:  [id number]  Electronically Signed by Hillis Range MD on 03/04/2011 08:44:37 AM

## 2011-03-04 NOTE — Progress Notes (Signed)
HPI  [name] is a 70 y.o. male Is seen in followup for recurrent ventricular tachycardia. He has been on multiple medications in the past, and underwent catheter ablation in the setting of VT storm September 2012. He had post ablation ventricular tachycardia and was started on amiodarone which had previously not been used because of concurrent hypothyroidism. This is been treated intercurrently by Dr. Evlyn Kanner.he has been continued on mexiletine in conjunction with his amiodarone  His history of coronary artery disease with prior MI in 1999 treated percutaneously stenting to his RCA in 2005. Last cath in 2010 showing patent stents in the circumflex  and right coronary artery.  Ischemic cardiomyopathy with ejection fraction of 25-30% by echo in       February 2012,     Past Medical History  Diagnosis Date  . CAD (coronary artery disease) 1999    Remote anterolater and apical MI, cath 2011 patent stents RCA/LCx  LAD w/o obstruction  . Cardiomyopathy, ischemic     EF 20-25%  echo 2012  . Chronic systolic congestive heart failure   . Ventricular tachycardia     recurrent slow VT at 100-110 bpm,  Rx mexilitene/sotalol  . ICD (implantable cardiac defibrillator) in place     [place] Jude  . 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 Abdominal aortic aneurysm 2009    Repaired with endovascular stent  . PVD (peripheral vascular disease)     Has occluded innominate vein  . Left bundle branch block     Past Surgical History  Procedure Date  . Icd Feb 2012    Change out with LV lead placed with tunneling from the right  to the left side  . Endovascular stent insertion     for AAA  . Cardiac catheterization     x2    Current Outpatient Prescriptions  Medication Sig Dispense Refill  . ALPRAZolam (XANAX) 0.25 MG tablet Take 1 tablet (0.25 mg total) by mouth 4 (four) times daily as needed.  360 tablet  1  . amiodarone (PACERONE) 200 MG tablet Take 2 tablets (400 mg total) by mouth 2  (two) times daily.  360 tablet  3  . Ascorbic Acid (VITAMIN C) 500 MG tablet Take 500 mg by mouth daily.        . carvedilol (COREG) 25 MG tablet Take 1 and 1/2 (half) tablets twice daily  270 tablet  3  . DOCUSATE CALCIUM PO Take 81 mg by mouth daily.        . isosorbide mononitrate (IMDUR) 30 MG 24 hr tablet TAKE ONE TABLET DAILY  90 tablet  3  . levothyroxine (SYNTHROID, LEVOTHROID) 88 MCG tablet Take 88 mcg by mouth daily.        . Loratadine (CLARITIN) 10 MG CAPS Take by mouth daily.        [name] losartan (COZAAR) 50 MG tablet Take 1 tablet (50 mg total) by mouth daily.  90 tablet  3  . mexiletine (MEXITIL) 150 MG capsule TAKE 1 CAPSULE THREE TIMES A DAY  270 capsule  3  . MULTIPLE VITAMIN PO Take by mouth daily.        . Omega-3 Fatty Acids (FISH OIL) 1000 MG CAPS Take by mouth 2 (two) times daily.        . potassium chloride SA (K-DUR,KLOR-CON) 20 MEQ tablet Take 20 mEq by mouth 3 (three) times a week. Monday, Wednesday, friday      . rosuvastatin (CRESTOR) 5  MG tablet Take 5 mg by mouth every other day.        . spironolactone (ALDACTONE) 25 MG tablet Take 0.5 tablets (12.5 mg total) by mouth daily.  90 tablet  3  . warfarin (COUMADIN) 2 MG tablet Take as directed by Anticoagulation Clinic.  30 tablet  1  . meclizine (ANTIVERT) 25 MG tablet Take 1 tablet (25 mg total) by mouth 3 (three) times daily as needed.  270 tablet  1  . DISCONTD: meclizine (ANTIVERT) 25 MG tablet Take 25 mg by mouth 2 (two) times daily.         Allergies  Allergen Reactions  . Iohexol      Code: HIVES, Desc: hives w/ itching during cardiac cath '99, mult caths since w/ ? premeds, DR.G.HAYES REQUESTS 13 HR PRE MED//A.C.pt okay w/13 hr prep/mms, Onset Date: [id number]   . Prednisone     Review of Systems negative except from HPI and PMH  Physical Exam Well developed and well nourished in no acute distress HENT normal E scleral and icterus clear Neck Supple JVP flat; carotids brisk and full Clear to  ausculation Regular rate and rhythm, no murmurs gallops or rub Soft with active bowel sounds No clubbing cyanosis and edema Alert and oriented, grossly normal motor and sensory function Skin Warm and Dry   Assessment and  Plan

## 2011-03-04 NOTE — Assessment & Plan Note (Signed)
He was having a series of afternoon blood pressure spikes. This has been adjusted by having him take his losartan at the mid-day with very good control of blood pressure

## 2011-03-04 NOTE — Patient Instructions (Signed)
Your physician has recommended you make the following change in your medication:  1) take amiodarone 200mg  one tablet twice daily for 4 weeks, then 2) decrease amiodarone to 200mg  once daily.  Your physician recommends that you have lab work today: bmp/cbc [id number]  Your physician wants you to follow-up in: 3 months with Dr. Graciela Husbands. You will receive a reminder letter in the mail two months in advance. If you don't receive a letter, please call our office to schedule the follow-up appointment.

## 2011-03-04 NOTE — Assessment & Plan Note (Signed)
His device has been programmed to a lower rate limit of 80. We'll anticipate decreasing this but it was being used for overdrive suppression of his ventricular ectopy and we found that anything much less than this was inadequate. Hopefully the amiodarone builds up, we can do away with this

## 2011-03-04 NOTE — Assessment & Plan Note (Signed)
Stable on current medications 

## 2011-03-04 NOTE — Assessment & Plan Note (Signed)
Ventricular tachycardia is currently quiet on amiodarone and mexiletine. We will decrease his amiodarone dose today from 800-400 daily for 4 weeks and then to drop it to 200 mg a day. He is having a little bit of a tremor. Hopefully this neurotoxicity will abate with decreasing of his dose. His Coumadin is being a properly monitored and readjusted. In the event that he remained  VT free over 3-6 months, I will think about decreasing his mexiletine.

## 2011-03-10 ENCOUNTER — Telehealth: Payer: Self-pay | Admitting: Cardiology

## 2011-03-10 NOTE — Telephone Encounter (Signed)
Pt would like results of potassium drawn last week

## 2011-03-10 NOTE — Telephone Encounter (Signed)
Advised patient of labs, continue same dose of medications

## 2011-03-18 ENCOUNTER — Ambulatory Visit (INDEPENDENT_AMBULATORY_CARE_PROVIDER_SITE_OTHER): Payer: Medicare Other | Admitting: *Deleted

## 2011-03-18 DIAGNOSIS — Z7901 Long term (current) use of anticoagulants: Secondary | ICD-10-CM

## 2011-03-18 DIAGNOSIS — I255 Ischemic cardiomyopathy: Secondary | ICD-10-CM

## 2011-03-18 DIAGNOSIS — I2589 Other forms of chronic ischemic heart disease: Secondary | ICD-10-CM

## 2011-03-18 LAB — POCT INR: INR: 3

## 2011-03-25 ENCOUNTER — Emergency Department (HOSPITAL_COMMUNITY)
Admission: EM | Admit: 2011-03-25 | Discharge: 2011-03-25 | Disposition: A | Discharge status: Home / Self Care | Payer: Medicare Other | Attending: Emergency Medicine | Admitting: Emergency Medicine

## 2011-03-25 ENCOUNTER — Emergency Department (HOSPITAL_COMMUNITY): Payer: Medicare Other

## 2011-03-25 DIAGNOSIS — R000 Tachycardia, unspecified: Secondary | ICD-10-CM | POA: Insufficient documentation

## 2011-03-25 DIAGNOSIS — R079 Chest pain, unspecified: Secondary | ICD-10-CM | POA: Insufficient documentation

## 2011-03-25 DIAGNOSIS — R0989 Other specified symptoms and signs involving the circulatory and respiratory systems: Secondary | ICD-10-CM | POA: Insufficient documentation

## 2011-03-25 DIAGNOSIS — I2510 Atherosclerotic heart disease of native coronary artery without angina pectoris: Secondary | ICD-10-CM | POA: Insufficient documentation

## 2011-03-25 DIAGNOSIS — Z7901 Long term (current) use of anticoagulants: Secondary | ICD-10-CM | POA: Insufficient documentation

## 2011-03-25 DIAGNOSIS — E039 Hypothyroidism, unspecified: Secondary | ICD-10-CM | POA: Insufficient documentation

## 2011-03-25 DIAGNOSIS — E7800 Pure hypercholesterolemia, unspecified: Secondary | ICD-10-CM | POA: Insufficient documentation

## 2011-03-25 DIAGNOSIS — I10 Essential (primary) hypertension: Secondary | ICD-10-CM | POA: Insufficient documentation

## 2011-03-25 DIAGNOSIS — Z950 Presence of cardiac pacemaker: Secondary | ICD-10-CM | POA: Insufficient documentation

## 2011-03-25 DIAGNOSIS — R0609 Other forms of dyspnea: Secondary | ICD-10-CM | POA: Insufficient documentation

## 2011-03-25 DIAGNOSIS — R002 Palpitations: Secondary | ICD-10-CM | POA: Insufficient documentation

## 2011-03-25 DIAGNOSIS — Z79899 Other long term (current) drug therapy: Secondary | ICD-10-CM | POA: Insufficient documentation

## 2011-03-25 LAB — BASIC METABOLIC PANEL
CO2: 28 mEq/L (ref 19–32)
Calcium: 9.4 mg/dL (ref 8.4–10.5)
GFR calc Af Amer: 67 mL/min — ABNORMAL LOW (ref >90)
GFR calc non Af Amer: 58 mL/min — ABNORMAL LOW (ref >90)
Sodium: 139 mEq/L (ref 135–145)

## 2011-03-25 LAB — DIFFERENTIAL
Basophils Relative: 1 % (ref 0–1)
Eosinophils Absolute: 0.2 10*3/uL (ref 0.0–0.7)
Eosinophils Relative: 3 % (ref 0–5)
Monocytes Relative: 14 % — ABNORMAL HIGH (ref 3–12)
Neutrophils Relative %: 63 % (ref 43–77)

## 2011-03-25 LAB — CBC
MCH: 32.9 pg (ref 26.0–34.0)
Platelets: 247 10*3/uL (ref 150–400)
RBC: 4.07 MIL/uL — ABNORMAL LOW (ref 4.22–5.81)
RDW: 13.1 % (ref 11.5–15.5)

## 2011-03-25 LAB — CK TOTAL AND CKMB (NOT AT ARMC)
CK, MB: 3.1 ng/mL (ref 0.3–4.0)
Total CK: 72 U/L (ref 7–232)

## 2011-04-03 ENCOUNTER — Telehealth: Payer: Self-pay | Admitting: Nurse Practitioner

## 2011-04-03 ENCOUNTER — Other Ambulatory Visit: Payer: Self-pay | Admitting: Cardiology

## 2011-04-03 NOTE — Telephone Encounter (Signed)
Pt called this am to report that for a few hrs last night his hr was between 104 and 110.  He was asymptomatic except for noting the elevated HR.  This has since resolved.  He had a few questions about his device pertaining to lower rate limit (SK's last note says this is set @ 80, though pt says it has since been adjusted to 85) and ATP and Defib rates.  I answered pts questions and he verbalized understanding.  He has f/u with Dr. Patty Sermons on Tuesday but knows to present to ED for either prolonged or symptomatic tachycardia.

## 2011-04-05 ENCOUNTER — Encounter: Payer: Medicare Other | Admitting: Internal Medicine

## 2011-04-05 ENCOUNTER — Telehealth: Payer: Self-pay | Admitting: Internal Medicine

## 2011-04-05 NOTE — Telephone Encounter (Signed)
Refilled meclizine.  

## 2011-04-05 NOTE — Telephone Encounter (Signed)
Pt's heart rate too fast, sob, no appetite, wants appt today, wants nurse to talk to paula and see if anything showed up on device over the weekend

## 2011-04-05 NOTE — Telephone Encounter (Signed)
I spoke with the patient and his wife. He states he has had elevated HR's since Friday. His HR's usually run in the 85-86 range. He has had episodes of rates from 100-109. He had an episode Friday for about an hour, Saturday from about 1:45am- 9:30am, and Sunday all day and through the night. He does have some SOB with activity. The patient's wife states that his EF is in the 30's. Per the patient, he weighs daily, but denies any edema or weight gain. He states he is actually down about 7-8 lbs since he was last in the hospital. His weight this morning was 179 lbs. The patient states he is still out of rhythm. Per Gunnar Fusi, she has not received any transmissions from the patient. I have informed him that per Gunnar Fusi, he will need to send a manuel transmission. He is not familiar with how to do this. I have asked Gunnar Fusi to call him and I will call him back after transmissions have been received and reviewed with Dr. Graciela Husbands.

## 2011-04-05 NOTE — Telephone Encounter (Signed)
I spoke with [name] in device clinic. She received a transmission on the patient and spoke with him. She states that he needs to have his device adjusted a little. He has an appointment with Dr. Patty Sermons tomorrow and she will make adjustments then.

## 2011-04-06 ENCOUNTER — Ambulatory Visit (INDEPENDENT_AMBULATORY_CARE_PROVIDER_SITE_OTHER): Payer: Medicare Other | Admitting: Cardiology

## 2011-04-06 ENCOUNTER — Ambulatory Visit (INDEPENDENT_AMBULATORY_CARE_PROVIDER_SITE_OTHER): Payer: Medicare Other | Admitting: *Deleted

## 2011-04-06 ENCOUNTER — Encounter: Payer: Self-pay | Admitting: Cardiology

## 2011-04-06 ENCOUNTER — Emergency Department (HOSPITAL_COMMUNITY): Payer: Medicare Other

## 2011-04-06 ENCOUNTER — Inpatient Hospital Stay (HOSPITAL_COMMUNITY)
Admission: EM | Admit: 2011-04-06 | Discharge: 2011-04-08 | DRG: 308 | Disposition: A | Discharge status: Other Acute Inpatient Hospital | Payer: Medicare Other | Attending: Internal Medicine | Admitting: Internal Medicine

## 2011-04-06 ENCOUNTER — Encounter (HOSPITAL_COMMUNITY): Payer: Self-pay | Admitting: *Deleted

## 2011-04-06 ENCOUNTER — Other Ambulatory Visit: Payer: Self-pay

## 2011-04-06 DIAGNOSIS — Z7901 Long term (current) use of anticoagulants: Secondary | ICD-10-CM

## 2011-04-06 DIAGNOSIS — I472 Ventricular tachycardia, unspecified: Principal | ICD-10-CM | POA: Diagnosis present

## 2011-04-06 DIAGNOSIS — Z9861 Coronary angioplasty status: Secondary | ICD-10-CM

## 2011-04-06 DIAGNOSIS — I499 Cardiac arrhythmia, unspecified: Secondary | ICD-10-CM

## 2011-04-06 DIAGNOSIS — I959 Hypotension, unspecified: Secondary | ICD-10-CM | POA: Diagnosis not present

## 2011-04-06 DIAGNOSIS — I2589 Other forms of chronic ischemic heart disease: Secondary | ICD-10-CM

## 2011-04-06 DIAGNOSIS — E0590 Thyrotoxicosis, unspecified without thyrotoxic crisis or storm: Secondary | ICD-10-CM

## 2011-04-06 DIAGNOSIS — I255 Ischemic cardiomyopathy: Secondary | ICD-10-CM

## 2011-04-06 DIAGNOSIS — R0600 Dyspnea, unspecified: Secondary | ICD-10-CM

## 2011-04-06 DIAGNOSIS — I5022 Chronic systolic (congestive) heart failure: Secondary | ICD-10-CM

## 2011-04-06 DIAGNOSIS — I252 Old myocardial infarction: Secondary | ICD-10-CM

## 2011-04-06 DIAGNOSIS — Z95810 Presence of automatic (implantable) cardiac defibrillator: Secondary | ICD-10-CM | POA: Diagnosis present

## 2011-04-06 DIAGNOSIS — I4729 Other ventricular tachycardia: Principal | ICD-10-CM | POA: Diagnosis present

## 2011-04-06 DIAGNOSIS — I714 Abdominal aortic aneurysm, without rupture, unspecified: Secondary | ICD-10-CM | POA: Diagnosis present

## 2011-04-06 DIAGNOSIS — I2510 Atherosclerotic heart disease of native coronary artery without angina pectoris: Secondary | ICD-10-CM | POA: Diagnosis present

## 2011-04-06 DIAGNOSIS — I509 Heart failure, unspecified: Secondary | ICD-10-CM | POA: Diagnosis present

## 2011-04-06 DIAGNOSIS — R001 Bradycardia, unspecified: Secondary | ICD-10-CM | POA: Insufficient documentation

## 2011-04-06 DIAGNOSIS — I447 Left bundle-branch block, unspecified: Secondary | ICD-10-CM | POA: Diagnosis present

## 2011-04-06 DIAGNOSIS — I739 Peripheral vascular disease, unspecified: Secondary | ICD-10-CM | POA: Diagnosis present

## 2011-04-06 DIAGNOSIS — Z79899 Other long term (current) drug therapy: Secondary | ICD-10-CM

## 2011-04-06 DIAGNOSIS — F419 Anxiety disorder, unspecified: Secondary | ICD-10-CM

## 2011-04-06 DIAGNOSIS — I5023 Acute on chronic systolic (congestive) heart failure: Secondary | ICD-10-CM | POA: Diagnosis present

## 2011-04-06 DIAGNOSIS — F411 Generalized anxiety disorder: Secondary | ICD-10-CM | POA: Diagnosis present

## 2011-04-06 DIAGNOSIS — I10 Essential (primary) hypertension: Secondary | ICD-10-CM | POA: Insufficient documentation

## 2011-04-06 LAB — ICD DEVICE OBSERVATION
AL IMPEDENCE ICD: 487.5 Ohm
AL THRESHOLD: 2.875 V
ATRIAL PACING ICD: 43 pct
BAMS-0001: 170 {beats}/min
DEV-0020ICD: NEGATIVE
MODE SWITCH EPISODES: 0
PACEART VT: 0
RV LEAD THRESHOLD: 1.375 V
TOT-0006: 20120229000000
TOT-0007: 0
VENTRICULAR PACING ICD: 25 pct

## 2011-04-06 LAB — BASIC METABOLIC PANEL
BUN: 18 mg/dL (ref 6–23)
Calcium: 9.5 mg/dL (ref 8.4–10.5)
Creatinine, Ser: 1.41 mg/dL — ABNORMAL HIGH (ref 0.50–1.35)
GFR calc non Af Amer: 49 mL/min — ABNORMAL LOW (ref >90)
Glucose, Bld: 110 mg/dL — ABNORMAL HIGH (ref 70–99)
Sodium: 139 mEq/L (ref 135–145)

## 2011-04-06 LAB — CBC
Hemoglobin: 12.8 g/dL — ABNORMAL LOW (ref 13.0–17.0)
MCH: 31.8 pg (ref 26.0–34.0)
MCHC: 33.4 g/dL (ref 30.0–36.0)
MCV: 95.3 fL (ref 78.0–100.0)

## 2011-04-06 LAB — POCT INR: INR: 4.4

## 2011-04-06 MED ORDER — LORATADINE 10 MG PO TABS
10.0000 mg | ORAL_TABLET | Freq: Every day | ORAL | Status: DC
  Administered 2011-04-07 – 2011-04-08 (×2): 10 mg via ORAL
  Filled 2011-04-06 (×2): qty 1

## 2011-04-06 MED ORDER — NITROGLYCERIN 0.4 MG SL SUBL: 0.4000 mg | SUBLINGUAL_TABLET | SUBLINGUAL | Status: DC | PRN

## 2011-04-06 MED ORDER — ONDANSETRON HCL 4 MG/2ML IJ SOLN
4.0000 mg | Freq: Four times a day (QID) | INTRAMUSCULAR | Status: DC | PRN
  Administered 2011-04-08 (×2): 4 mg via INTRAVENOUS
  Filled 2011-04-06 (×3): qty 2

## 2011-04-06 MED ORDER — MECLIZINE HCL 25 MG PO TABS
25.0000 mg | ORAL_TABLET | Freq: Two times a day (BID) | ORAL | Status: DC
  Administered 2011-04-07 – 2011-04-08 (×3): 25 mg via ORAL
  Filled 2011-04-06 (×5): qty 1

## 2011-04-06 MED ORDER — ALPRAZOLAM 0.25 MG PO TABS
0.2500 mg | ORAL_TABLET | Freq: Four times a day (QID) | ORAL | Status: DC | PRN
  Administered 2011-04-07 – 2011-04-08 (×6): 0.25 mg via ORAL
  Filled 2011-04-06 (×6): qty 1

## 2011-04-06 MED ORDER — DOCUSATE SODIUM 100 MG PO CAPS
100.0000 mg | ORAL_CAPSULE | Freq: Every day | ORAL | Status: DC
  Administered 2011-04-07 – 2011-04-08 (×2): 100 mg via ORAL
  Filled 2011-04-06 (×2): qty 1

## 2011-04-06 MED ORDER — SODIUM CHLORIDE 0.9 % IV SOLN: 250.0000 mL | INTRAVENOUS | Status: DC

## 2011-04-06 MED ORDER — LORATADINE 10 MG PO CAPS: ORAL_CAPSULE | Freq: Every day | ORAL | Status: DC

## 2011-04-06 MED ORDER — CARVEDILOL 25 MG PO TABS
37.5000 mg | ORAL_TABLET | Freq: Two times a day (BID) | ORAL | Status: DC
  Administered 2011-04-07: 37.5 mg via ORAL
  Filled 2011-04-06 (×5): qty 1

## 2011-04-06 MED ORDER — ASPIRIN 325 MG PO TABS: 325.0000 mg | ORAL_TABLET | ORAL | Status: DC

## 2011-04-06 MED ORDER — SODIUM CHLORIDE 0.9 % IJ SOLN: 3.0000 mL | INTRAMUSCULAR | Status: DC | PRN

## 2011-04-06 MED ORDER — POTASSIUM CHLORIDE CRYS ER 20 MEQ PO TBCR
20.0000 meq | EXTENDED_RELEASE_TABLET | ORAL | Status: DC
  Administered 2011-04-07: 20 meq via ORAL
  Filled 2011-04-06 (×3): qty 1

## 2011-04-06 MED ORDER — ACETAMINOPHEN 325 MG PO TABS
650.0000 mg | ORAL_TABLET | ORAL | Status: DC | PRN
  Filled 2011-04-06: qty 2

## 2011-04-06 MED ORDER — DEXTROSE 5 % IV SOLN
150.0000 mg | Freq: Once | INTRAVENOUS | Status: DC
  Administered 2011-04-06: 150 mg via INTRAVENOUS
  Filled 2011-04-06: qty 3

## 2011-04-06 MED ORDER — ROSUVASTATIN CALCIUM 5 MG PO TABS
5.0000 mg | ORAL_TABLET | ORAL | Status: DC
  Administered 2011-04-07: 5 mg via ORAL
  Filled 2011-04-06: qty 1

## 2011-04-06 MED ORDER — SODIUM CHLORIDE 0.9 % IJ SOLN
3.0000 mL | Freq: Two times a day (BID) | INTRAMUSCULAR | Status: DC
  Administered 2011-04-07: 3 mL via INTRAVENOUS

## 2011-04-06 MED ORDER — LEVOTHYROXINE SODIUM 88 MCG PO TABS
88.0000 ug | ORAL_TABLET | Freq: Every day | ORAL | Status: DC
  Administered 2011-04-07 – 2011-04-08 (×2): 88 ug via ORAL
  Filled 2011-04-06 (×2): qty 1

## 2011-04-06 MED ORDER — VITAMIN C 500 MG PO TABS
500.0000 mg | ORAL_TABLET | Freq: Every day | ORAL | Status: DC
  Administered 2011-04-07 – 2011-04-08 (×2): 500 mg via ORAL
  Filled 2011-04-06 (×2): qty 1

## 2011-04-06 MED ORDER — WARFARIN SODIUM 1 MG PO TABS: 1.0000 mg | ORAL_TABLET | Freq: Every day | ORAL | Status: DC

## 2011-04-06 MED ORDER — FUROSEMIDE 10 MG/ML IJ SOLN
40.0000 mg | Freq: Three times a day (TID) | INTRAMUSCULAR | Status: DC
  Administered 2011-04-07: 40 mg via INTRAVENOUS
  Filled 2011-04-06 (×4): qty 4

## 2011-04-06 MED ORDER — DEXTROSE 5 % IV SOLN
150.0000 mg | Freq: Once | INTRAVENOUS | Status: DC
  Filled 2011-04-06: qty 3

## 2011-04-06 MED ORDER — FUROSEMIDE 10 MG/ML IJ SOLN
80.0000 mg | Freq: Once | INTRAMUSCULAR | Status: AC
  Administered 2011-04-06: 80 mg via INTRAVENOUS
  Filled 2011-04-06: qty 8

## 2011-04-06 MED ORDER — MEXILETINE HCL 150 MG PO CAPS
150.0000 mg | ORAL_CAPSULE | Freq: Three times a day (TID) | ORAL | Status: DC
  Administered 2011-04-07 – 2011-04-08 (×6): 150 mg via ORAL
  Filled 2011-04-06 (×9): qty 1

## 2011-04-06 MED ORDER — DEXTROSE 5 % IV SOLN
150.0000 mg | Freq: Once | INTRAVENOUS | Status: AC
  Administered 2011-04-07: 150 mg via INTRAVENOUS
  Filled 2011-04-06: qty 3

## 2011-04-06 MED ORDER — DEXTROSE 5 % IV SOLN
150.0000 mg | Freq: Once | INTRAVENOUS | Status: AC
  Administered 2011-04-06: 150 mg via INTRAVENOUS
  Filled 2011-04-06: qty 3

## 2011-04-06 MED ORDER — LOSARTAN POTASSIUM 50 MG PO TABS
50.0000 mg | ORAL_TABLET | Freq: Every day | ORAL | Status: DC
  Administered 2011-04-07: 50 mg via ORAL
  Filled 2011-04-06 (×2): qty 1

## 2011-04-06 MED ORDER — ISOSORBIDE MONONITRATE ER 30 MG PO TB24
30.0000 mg | ORAL_TABLET | Freq: Every day | ORAL | Status: DC
  Administered 2011-04-07: 30 mg via ORAL
  Filled 2011-04-06 (×2): qty 1

## 2011-04-06 MED ORDER — SPIRONOLACTONE 25 MG PO TABS
12.5000 mg | ORAL_TABLET | Freq: Every day | ORAL | Status: DC
  Administered 2011-04-07: 12.5 mg via ORAL
  Filled 2011-04-06: qty 0.5

## 2011-04-06 MED ORDER — LOSARTAN POTASSIUM 50 MG PO TABS: 50.0000 mg | ORAL_TABLET | Freq: Every day | ORAL | Status: DC

## 2011-04-06 NOTE — ED Notes (Signed)
I put pt's bed down. 5:08 JG

## 2011-04-06 NOTE — ED Notes (Signed)
Attempted to call report x 1, nurse states didn't know a pt was coming.  Gave name and number and [place], RN to call back.

## 2011-04-06 NOTE — Progress Notes (Signed)
ANTICOAGULATION CONSULT NOTE - Initial Consult  Pharmacy Consult for Coumadin Indication: vtach s/p ICD  Allergies  Allergen Reactions  . Iohexol      Code: HIVES, Desc: hives w/ itching during cardiac cath '99, mult caths since w/ ? premeds, DR.G.HAYES REQUESTS 13 HR PRE MED//A.C.pt okay w/13 hr prep/mms, Onset Date: [id number]   . Prednisone     Patient Measurements: Height: 5\' 8"  (172.7 cm) Weight: 175 lb (79.379 kg) IBW/kg (Calculated) : 68.4   Vital Signs: Temp: 99.1 F (37.3 C) (11/06 2237) Temp src: Oral (11/06 2237) BP: 122/72 mmHg (11/06 2237) Pulse Rate: 101  (11/06 2237)  Labs:  Basename 04/06/11 1345 04/06/11 1214  HGB 12.8* --  HCT 38.3* --  PLT 265 --  APTT -- --  LABPROT -- --  INR -- 4.4  HEPARINUNFRC -- --  CREATININE 1.41* --  CKTOTAL -- --  CKMB -- --  TROPONINI -- --   Estimated Creatinine Clearance: 47.8 ml/min (by C-G formula based on Cr of 1.41).  Medical History: Past Medical History  Diagnosis Date  . CAD (coronary artery disease) 1999    Remote anterolater and apical MI, cath 2011 patent stents RCA/LCx  LAD w/o obstruction  . Cardiomyopathy, ischemic     EF 20-25%  echo 2012  . Chronic systolic congestive heart failure   . Ventricular tachycardia     recurrent slow VT at 100-110 bpm,  Rx mexilitene/sotalol  . ICD (implantable cardiac defibrillator) in place     [place] Jude  . 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 Abdominal aortic aneurysm 2009    Repaired with endovascular stent  . PVD (peripheral vascular disease)     Has occluded innominate vein  . Left bundle branch block     Medications:  Prescriptions prior to admission  Medication Sig Dispense Refill  . ALPRAZolam (XANAX) 0.25 MG tablet Take 1 tablet (0.25 mg total) by mouth 4 (four) times daily as needed.  360 tablet  1  . amiodarone (PACERONE) 200 MG tablet 400 mg 2 (two) times daily. Take one tablet by mouth twice daily for 4 weeks, then decrease to one tablet by mouth  once daily.      . Ascorbic Acid (VITAMIN C) 500 MG tablet Take 500 mg by mouth daily.        . carvedilol (COREG) 25 MG tablet Take 1 and 1/2 (half) tablets twice daily  270 tablet  3  . DOCUSATE CALCIUM PO Take 81 mg by mouth daily.       . isosorbide mononitrate (IMDUR) 30 MG 24 hr tablet TAKE ONE TABLET DAILY  90 tablet  3  . levothyroxine (SYNTHROID, LEVOTHROID) 88 MCG tablet Take 88 mcg by mouth daily.        . Loratadine (CLARITIN) 10 MG CAPS Take by mouth daily.        [name] losartan (COZAAR) 50 MG tablet Take 1 tablet (50 mg total) by mouth daily.  90 tablet  3  . mexiletine (MEXITIL) 150 MG capsule TAKE 1 CAPSULE THREE TIMES A DAY  270 capsule  3  . MULTIPLE VITAMIN PO Take by mouth daily.        . Omega-3 Fatty Acids (FISH OIL) 1000 MG CAPS Take by mouth 2 (two) times daily.        . potassium chloride SA (K-DUR,KLOR-CON) 20 MEQ tablet Take 20 mEq by mouth 3 (three) times a week. Monday, Wednesday, friday      .  rosuvastatin (CRESTOR) 5 MG tablet Take 5 mg by mouth every other day.        . spironolactone (ALDACTONE) 25 MG tablet Take 0.5 tablets (12.5 mg total) by mouth daily.  90 tablet  3  . warfarin (COUMADIN) 2 MG tablet Take 1 mg by mouth daily.        . meclizine (ANTIVERT) 25 MG tablet TAKE 1 TABLET THREE TIMES A DAY AS NEEDED  270 tablet  3    Assessment: 70 y.o. Male presents with vtach. Pt with h/o vtach and has ICD. On coumadin PTA - baseline INR 4.4 - supratherapeutic. Noted plan for repeat ablation if needed later this week. Goal of Therapy:  INR 2-3   Plan:  1. Daily INR 2. No coumadin today  Lavonia Dana 04/06/2011,11:47 PM

## 2011-04-06 NOTE — H&P (Signed)
Admit date: 04/06/2011 Referring Physician Graciela Husbands Primary Cardiologist Dr. Patty Sermons Chief complaint/reason for admission: Ventricular tachycardia  HPI: The patient presented with ventricular tachycardia to the office. His presenting symptoms for shortness of breath and fatigue. He was found to be in slow and incessant ventricular tachycardia which was successfully pace terminated but then immediately resumed. The patient had undergone VT ablation a couple of weeks ago for similar presenting slow VT. The following day he had fast VT and end up getting shocked. It certainly was apparently reprogrammed to eliminate shocks except for life-threatening ventricular tachycardia.  The patient has ischemic cardiac myopathy with severe depression of LV function. He has a history of prior amiodarone-induced thyrotoxicosis but has been reinitiated on amiodarone under the guidance of his endocrinologist.  He has nocturnal dyspnea and without edema. He also has orthopnea.     Past Medical History  Diagnosis Date  . CAD (coronary artery disease) 1999    Remote anterolater and apical MI, cath 2011 patent stents RCA/LCx  LAD w/o obstruction  . Cardiomyopathy, ischemic     EF 20-25%  echo 2012  . Chronic systolic congestive heart failure   . Ventricular tachycardia     recurrent slow VT at 100-110 bpm,  Rx mexilitene/sotalol  . ICD (implantable cardiac defibrillator) in place     [name] Jude  . 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 Abdominal aortic aneurysm 2009    Repaired with endovascular stent  . PVD (peripheral vascular disease)     Has occluded innominate vein  . Left bundle branch block       Past Surgical History  Procedure Date  . Icd Feb 2012    Change out with LV lead placed with tunneling from the right  to the left side  . Endovascular stent insertion     for AAA  . Cardiac catheterization     x2  . Pacemaker insertion     Allergies: Iohexol and Prednisone   (Not in a hospital  admission)   Family History  Problem Relation Age of Onset  . Stroke Mother   . Heart attack Father   . Heart attack Mother      History   Social History  . Marital Status: Married    Spouse Name: N/A    Number of Children: N/A  . Years of Education: N/A   Occupational History  . retired      used to Midwife   Social History Main Topics  . Smoking status: Former Games developer  . Smokeless tobacco: Former Neurosurgeon    Quit date: 06/01/1987  . Alcohol Use: Yes     very rare  . Drug Use: No  . Sexually Active: Not on file   Other Topics Concern  . Not on file   Social History Narrative  . No narrative on file     Review of systems is negative apart from the history of present illness and PMH   Physical Exam: Blood pressure 120/88, pulse 107, temperature 98.2 F (36.8 C), temperature source Oral, resp. rate 23, SpO2 100.00%.   BP 120/88  Pulse 107  Temp(Src) 98.2 F (36.8 C) (Oral)  Resp 23  SpO2 100%  General Appearance:    Pleasant white male, alert and oriented in no acute distress appears stated age  Head:    Normocephalic, without obvious abnormality, atraumatic  Eyes:    PERRL, conjunctiva/corneas clear, EOM's intact, fundi    benign, both eyes  Ears:    Normal TM's and external ear canals, both ears  Nose:   Nares normal, septum midline, mucosa normal, no drainage   or sinus tenderness  Throat:   Lips, mucosa, and tongue normal; teeth and gums normal  Neck:   Supple,no masses, no thyromegaly, JVP is 10 cm carotids are diminished   Back:     Symmetric, no curvature, ROM normal, no CVA tenderness  Lungs:    rales bilaterally  Chest wall:    No tenderness or deformity  Heart:   rapid but regular rate S1-S2 are normal a 2/6 murmur was audible the apex  Abdomen:     Soft, non-tender, bowel sounds normal,    no masses, no organomegaly no aneurysm noted  Rectal:    Normal tone, normal prostate, no masses or tenderness;   guaiac negative stool    Extremities:   Extremities normal, atraumatic, no cyanosis or edema  Pulses:   Femoral and distal pulses 2+, No bruits  Skin:   Skin color, texture, turgor normal, no rashes or lesions  Lymph nodes:   Cervical, supraclavicular, and axillary nodes normal  Neurologic:   CNII-XII intact. Normal strength, sensation and reflexes      throughout   Labs:   Lab Results  Component Value Date   WBC 9.2 04/06/2011   HGB 12.8* 04/06/2011   HCT 38.3* 04/06/2011   MCV 95.3 04/06/2011   PLT 265 04/06/2011     Lab 04/06/11 1345  NA 139  K 4.6  CL 104  CO2 25  BUN 18  CREATININE 1.41*  CALCIUM 9.5  PROT --  BILITOT --  ALKPHOS --  ALT --  AST --  GLUCOSE 110*   Lab Results  Component Value Date   CKTOTAL 72 03/25/2011   CKMB 3.1 03/25/2011   TROPONINI <0.30 03/25/2011    Lab Results  Component Value Date   CHOL  Value: 171        ATP III CLASSIFICATION:  <200     mg/dL   Desirable  454-098  mg/dL   Borderline High  >=119    mg/dL   High 1/47/8295   Lab Results  Component Value Date   HDL 47 12/21/2007   Lab Results  Component Value Date   LDLCALC  Value: 115        Total Cholesterol/HDL:CHD Risk Coronary Heart Disease Risk Table                     Men   Women  1/2 Average Risk   3.4   3.3* 12/21/2007   Lab Results  Component Value Date   TRIG 47 12/21/2007   Lab Results  Component Value Date   CHOLHDL 3.6 12/21/2007       Radiology  Electrocardiogram demonstrates sustained ventricular tachycardia ASSESSMENT AND PLAN:  Patient Active Hospital Problem List: Cardiomyopathy, ischemic ()   Assessment: The patient's ischemic myopathy is stable.    Plan: Continue current medications  Acute on chronic systolic heart failure ()   Assessment: Heart failure is likely a consequence of the incessant ventricular tachycardia.    Plan: For now we'll use IV Lasix as well as try to control the ventricular tachycardia  Ventricular tachycardia ()   Assessment: This is incessant. We  have had a lengthy discussion regarding our approach. We will initially attempt intravenous amiodarone suppression of VT. I'm not altogether sanguine that this will work.    Plan: IV amiodarone with repeated boluses  as necessary. Anticipate repeat catheter ablation later this week if VT persists  ICD (implantable cardiac defibrillator), biventricular,[facility] (11/03/2010)   Assessment: Device stable    Plan: Attempt to pace terminate VT were unsuccessful  Signed:  04/06/2011, 4:57 PM

## 2011-04-06 NOTE — Assessment & Plan Note (Signed)
The patient is clinically euthyroid on his present dose of Synthroid.

## 2011-04-06 NOTE — ED Provider Notes (Addendum)
History     CSN: [id number] Arrival date & time: 04/06/2011 12:35 PM   First MD Initiated Contact with Patient 04/06/11 1313      Chief Complaint  Patient presents with  . Tachycardia    HPI Elderly male presents emergency department from his cardiology office where he went with complaint of shortness of breath.  Patient has a history of V. tach and according to the patient his cardiologist told him he thought he had some V. tach and needed to medication to get him controlled.  Patient is denying any serious chest pain.  Denies any abdominal pain, sweating, nausea, vomiting.  Patient does not have any syncope.   Past Medical History  Diagnosis Date  . CAD (coronary artery disease) 1999    Remote anterolater and apical MI, cath 2011 patent stents RCA/LCx  LAD w/o obstruction  . Cardiomyopathy, ischemic     EF 20-25%  echo 2012  . Chronic systolic congestive heart failure   . Ventricular tachycardia     recurrent slow VT at 100-110 bpm,  Rx mexilitene/sotalol  . ICD (implantable cardiac defibrillator) in place     [place] Jude  . 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 Abdominal aortic aneurysm 2009    Repaired with endovascular stent  . PVD (peripheral vascular disease)     Has occluded innominate vein  . Left bundle branch block     Past Surgical History  Procedure Date  . Icd Feb 2012    Change out with LV lead placed with tunneling from the right  to the left side  . Endovascular stent insertion     for AAA  . Cardiac catheterization     x2  . Pacemaker insertion     Family History  Problem Relation Age of Onset  . Stroke Mother   . Heart attack Father     History  Substance Use Topics  . Smoking status: Former Games developer  . Smokeless tobacco: Former Neurosurgeon    Quit date: 06/01/1987  . Alcohol Use: No      Review of Systems  Constitutional: Positive for fatigue.  All other systems reviewed and are negative.    Allergies  Iohexol and Prednisone  Home  Medications   Current Outpatient Rx  Name Route Sig Dispense Refill  . ALPRAZOLAM 0.25 MG PO TABS Oral Take 1 tablet (0.25 mg total) by mouth 4 (four) times daily as needed. 360 tablet 1  . AMIODARONE HCL 200 MG PO TABS  400 mg 2 (two) times daily. Take one tablet by mouth twice daily for 4 weeks, then decrease to one tablet by mouth once daily.    [name] VITAMIN C 500 MG PO TABS Oral Take 500 mg by mouth daily.      [name] CARVEDILOL 25 MG PO TABS  Take 1 and 1/2 (half) tablets twice daily 270 tablet 3    NEW DOSE  . DOCUSATE CALCIUM PO Oral Take 81 mg by mouth daily.     . ISOSORBIDE MONONITRATE ER 30 MG PO TB24  TAKE ONE TABLET DAILY 90 tablet 3  . LEVOTHYROXINE SODIUM 88 MCG PO TABS Oral Take 88 mcg by mouth daily.      [name] LORATADINE 10 MG PO CAPS Oral Take by mouth daily.      [name] LOSARTAN POTASSIUM 50 MG PO TABS Oral Take 1 tablet (50 mg total) by mouth daily. 90 tablet 3  . MEXILETINE HCL 150 MG PO CAPS  TAKE  1 CAPSULE THREE TIMES A DAY 270 capsule 3  . MULTIPLE VITAMIN PO Oral Take by mouth daily.      [name] FISH OIL 1000 MG PO CAPS Oral Take by mouth 2 (two) times daily.      [name] POTASSIUM CHLORIDE CRYS CR 20 MEQ PO TBCR Oral Take 20 mEq by mouth 3 (three) times a week. Monday, Wednesday, friday    . ROSUVASTATIN CALCIUM 5 MG PO TABS Oral Take 5 mg by mouth every other day.      . SPIRONOLACTONE 25 MG PO TABS Oral Take 0.5 tablets (12.5 mg total) by mouth daily. 90 tablet 3  . WARFARIN SODIUM 2 MG PO TABS Oral Take 1 mg by mouth daily.      . MECLIZINE HCL 25 MG PO TABS  TAKE 1 TABLET THREE TIMES A DAY AS NEEDED 270 tablet 3    BP 125/76  Pulse 125  Temp(Src) 98.2 F (36.8 C) (Oral)  Resp 20  SpO2 96%  Physical Exam  ED Course  Procedures (including critical care time) [facility] consulted  Labs Reviewed  CBC  BASIC METABOLIC PANEL  POCT CARDIAC MARKERS   No results found.   No diagnosis found.    MDM   Date: 04/06/2011  Rate: 126  Rhythm: sinus tachycardia  QRS  Axis: indeterminate  Intervals: QT prolonged  ST/T Wave abnormalities: Nonspecific ST-T changes  Conduction Disutrbances:nonspecific intraventricular conduction delay  Narrative Interpretation:   Old EKG Reviewed: changes noted        Nelia Shi, MD 04/06/11 1334  Nelia Shi, MD 04/06/11 1529

## 2011-04-06 NOTE — ED Notes (Signed)
interogator at the bedside for [employer]

## 2011-04-06 NOTE — Progress Notes (Signed)
ICD check 

## 2011-04-06 NOTE — Progress Notes (Signed)
[name] Date of Birth:  Oct 15, 1940 [facility] [address]   [address] [place], [place]  [address] [phone]           Fax   [phone]  History of Present Illness: This pleasant 70 year old gentleman is seen for a scheduled followup office visit.  He has a past history of ischemic heart disease and a remote extensive anterolateral myocardial infarction.  He has a defibrillator in place.  He has had problems with recurrent ventricular tachycardia.  2 days ago he noticed that his heart rate was running in the range of 106 210 rather than 85.  He felt tired and weak and his wife noted that he looked pale.  He came in and was checked in the electrophysiology department by Dr. Ladona Ridgel who found that he was in a slow ventricular tachycardia at a rate of 106.  Dr. Ladona Ridgel was able to use the patient's defibrillator to pace him out of his ventricular tachycardia.  The patient had been on just 200 mg of amiodarone daily.  Dr. Ladona Ridgel instructed him to increase his amiodarone to 400 mg twice a day until he could be checked by Dr. Graciela Husbands.  She has a past history of thyrotoxicosis secondary to amiodarone therapy.  He is followed closely by his endocrinologist Dr. Evlyn Kanner.  Dr. Adria Devon recent thyroid function studies on 03/22/2011 showing a TSH of 1.94 and a free T4 of 1.5  Current Outpatient Prescriptions  Medication Sig Dispense Refill  . ALPRAZolam (XANAX) 0.25 MG tablet Take 1 tablet (0.25 mg total) by mouth 4 (four) times daily as needed.  360 tablet  1  . amiodarone (PACERONE) 200 MG tablet 400 mg 2 (two) times daily. Take one tablet by mouth twice daily for 4 weeks, then decrease to one tablet by mouth once daily.      . Ascorbic Acid (VITAMIN C) 500 MG tablet Take 500 mg by mouth daily.        . carvedilol (COREG) 25 MG tablet Take 1 and 1/2 (half) tablets twice daily  270 tablet  3  . DOCUSATE CALCIUM PO Take 81 mg by mouth daily.       . isosorbide mononitrate  (IMDUR) 30 MG 24 hr tablet TAKE ONE TABLET DAILY  90 tablet  3  . levothyroxine (SYNTHROID, LEVOTHROID) 88 MCG tablet Take 88 mcg by mouth daily.        . Loratadine (CLARITIN) 10 MG CAPS Take by mouth daily.        [name] losartan (COZAAR) 50 MG tablet Take 1 tablet (50 mg total) by mouth daily.  90 tablet  3  . meclizine (ANTIVERT) 25 MG tablet TAKE 1 TABLET THREE TIMES A DAY AS NEEDED  270 tablet  3  . mexiletine (MEXITIL) 150 MG capsule TAKE 1 CAPSULE THREE TIMES A DAY  270 capsule  3  . MULTIPLE VITAMIN PO Take by mouth daily.        . Omega-3 Fatty Acids (FISH OIL) 1000 MG CAPS Take by mouth 2 (two) times daily.        . potassium chloride SA (K-DUR,KLOR-CON) 20 MEQ tablet Take 20 mEq by mouth 3 (three) times a week. Monday, Wednesday, friday      . rosuvastatin (CRESTOR) 5 MG tablet Take 5 mg by mouth every other day.        . spironolactone (ALDACTONE) 25 MG tablet Take 0.5 tablets (12.5 mg total) by mouth daily.  90 tablet  3  . warfarin (COUMADIN) 2 MG tablet Take 1 mg by mouth daily.          Allergies  Allergen Reactions  . Iohexol      Code: HIVES, Desc: hives w/ itching during cardiac cath '99, mult caths since w/ ? premeds, DR.G.HAYES REQUESTS 13 HR PRE MED//A.C.pt okay w/13 hr prep/mms, Onset Date: 16109604   . Prednisone     Patient Active Problem List  Diagnoses  . THYROTOXICOSIS  . ABDOMINAL AORTIC ANEURYSM  . Cardiomyopathy, ischemic  . Chronic systolic congestive heart failure  . Ventricular tachycardia  . LBBB  . Chronic anticoagulation  . ICD (implantable cardiac defibrillator), biventricular,[facility]  . Sinus bradycardia  . HTN (hypertension)    History  Smoking status  . Former Smoker  Smokeless tobacco  . Former Neurosurgeon  . Quit date: 06/01/1987    History  Alcohol Use No    Family History  Problem Relation Age of Onset  . Stroke Mother   . Heart attack Father     Review of Systems: Constitutional: no fever chills diaphoresis or fatigue or  change in weight.  Head and neck: no hearing loss, no epistaxis, no photophobia or visual disturbance. Respiratory: No cough, shortness of breath or wheezing. Cardiovascular: No chest pain peripheral edema, palpitations. Gastrointestinal: No abdominal distention, no abdominal pain, no change in bowel habits hematochezia or melena. Genitourinary: No dysuria, no frequency, no urgency, no nocturia. Musculoskeletal:No arthralgias, no back pain, no gait disturbance or myalgias. Neurological: No dizziness, no headaches, no numbness, no seizures, no syncope, no weakness, no tremors. Hematologic: No lymphadenopathy, no easy bruising. Psychiatric: No confusion, no hallucinations, no sleep disturbance.    Physical Exam: Filed Vitals:   04/06/11 1208  BP: 128/84  Pulse: 86   Gen. appearance reveals a well-developed well-nourished elderly gentleman in no distress.Pupils equal and reactive.   Extraocular Movements are full.  There is no scleral icterus.  The mouth and pharynx are normal.  The neck is supple.  The carotids reveal no bruits.  The jugular venous pressure is normal.  The thyroid is not enlarged.  There is no lymphadenopathy.  The chest is clear to percussion and auscultation. There are no rales or rhonchi. Expansion of the chest is symmetrical.  The precordium is quiet.  The first heart sound is normal.  The second heart sound is physiologically split.  There is no murmur gallop rub or click.  There is no abnormal lift or heave.  The abdomen is soft and nontender. Bowel sounds are normal. The liver and spleen are not enlarged. There Are no abdominal masses. There are no bruits.  The pedal pulses are good.  There is no phlebitis or edema.  There is no cyanosis or clubbing.   Assessment / Plan: Continue amiodarone as instructed by Dr. Ladona Ridgel.  Recheck in my office one month for followup office visit.  He will also be getting a followup appointment soon with Dr. Graciela Husbands

## 2011-04-06 NOTE — Assessment & Plan Note (Signed)
The patient is on chronic Coumadin anticoagulation because of his dilated cardiomyopathy.  His INR today is above 4.  He given instructions by the Coumadin clinic taking into consideration that his dose of amiodarone is also being increased

## 2011-04-06 NOTE — ED Notes (Signed)
Pt received [place] sandwich at his request. Orders checked and Heart healthy diet placed by MD.

## 2011-04-06 NOTE — ED Notes (Signed)
Report called to [place], RN on 2900.

## 2011-04-06 NOTE — ED Notes (Signed)
EKG performed in triage and old and new with Dr. Radford Pax. Other copy placed in chart.

## 2011-04-06 NOTE — ED Notes (Signed)
Patient states he has had irregular heart rate since Sunday.  He was seen at Dr Ladona Ridgel office today and sent to ED for further eval of cardiac rhythm.  Patient states his heart rate was irreg 116 to 118

## 2011-04-06 NOTE — ED Notes (Signed)
Dr Graciela Husbands at bedside currently

## 2011-04-06 NOTE — ED Notes (Signed)
Family at bedside. 

## 2011-04-06 NOTE — Progress Notes (Signed)
Pt from ED entered room while still getting report from ED RN, who didn't know pt had left to transported. Will continue to monitor

## 2011-04-06 NOTE — H&P (Addendum)
History and Physical  Patient ID: [name] MRN: [id number], DOB/AGE: January 04, 1941 70 y.o. Date of Encounter: @TODAY @  Primary Physician: Julian Hy, MD Primary Cardiologist: Ronny Flurry  Chief Complaint: VT Reason for Consultation: Recurrent VT  HPI: [name] is a 70 year old male with below listed problems.  He was admitted in September of this year for VT storm and underwent VT ablation at that time.  He was also initiated on Amiodarone with subsequent down titration of his dose.  He has a history of thyroid toxicity with Amiodarone in the past.  Past Medical History  Diagnosis Date  . CAD (coronary artery disease) 1999    Remote anterolater and apical MI, cath 2011 patent stents RCA/LCx  LAD w/o obstruction  . Cardiomyopathy, ischemic     EF 20-25%  echo 2012  . Chronic systolic congestive heart failure   . Ventricular tachycardia     recurrent slow VT at 100-110 bpm,  Rx mexilitene/sotalol  . ICD (implantable cardiac defibrillator) in place     [name]  . 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 Abdominal aortic aneurysm 2009    Repaired with endovascular stent  . PVD (peripheral vascular disease)     Has occluded innominate vein  . Left bundle branch block      Surgical History:  Past Surgical History  Procedure Date  . Icd Feb 2012    Change out with LV lead placed with tunneling from the right  to the left side  . Endovascular stent insertion     for AAA  . Cardiac catheterization     x2  . Pacemaker insertion       Allergies:  Allergies  Allergen Reactions  . Iohexol      Code: HIVES, Desc: hives w/ itching during cardiac cath '99, mult caths since w/ ? premeds, DR.G.HAYES REQUESTS 13 HR PRE MED//A.C.pt okay w/13 hr prep/mms, Onset Date: [id number]   . Prednisone     History   Social History  . Marital Status: Married    Spouse Name: N/A    Number of Children: N/A  . Years of Education: N/A   Occupational History  . Not on  file.   Social History Main Topics  . Smoking status: Former Games developer  . Smokeless tobacco: Former Neurosurgeon    Quit date: 06/01/1987  . Alcohol Use: No  . Drug Use: No  . Sexually Active:    Other Topics Concern  . Not on file   Social History Narrative  . No narrative on file     Family History  Problem Relation Age of Onset  . Stroke Mother   . Heart attack Father     Review of Systems: General: negative for chills, fever, night sweats or weight changes. Positive for fatigue Cardiovascular: negative for chest pain, positive for dyspnea on exertion, orthopnea, palpitations. Negative for edema. Dermatological: negative for rash Respiratory: negative for cough or wheezing Urologic: negative for hematuria Abdominal: negative for nausea, vomiting, diarrhea, bright red blood per rectum, melena, or hematemesis Neurologic: negative for visual changes, syncope.  Positive dizziness All other systems reviewed and are otherwise negative except as noted above.  Labs:   Lab Results  Component Value Date   WBC 9.2 04/06/2011   HGB 12.8* 04/06/2011   HCT 38.3* 04/06/2011   MCV 95.3 04/06/2011   PLT 265 04/06/2011    Lab 04/06/11 1345  NA 139  K 4.6  CL 104  CO2  25  BUN 18  CREATININE 1.41*  CALCIUM 9.5  PROT --  BILITOT --  ALKPHOS --  ALT --  AST --  GLUCOSE 110*   Lab Results  Component Value Date   CKTOTAL 72 03/25/2011   CKMB 3.1 03/25/2011   TROPONINI <0.30 03/25/2011    Lab Results  Component Value Date   CHOL  Value: 171        ATP III CLASSIFICATION:  <200     mg/dL   Desirable  440-102  mg/dL   Borderline High  >=725    mg/dL   High 3/66/4403   Lab Results  Component Value Date   HDL 47 12/21/2007   Lab Results  Component Value Date   LDLCALC  Value: 115        Total Cholesterol/HDL:CHD Risk Coronary Heart Disease Risk Table                     Men   Women  1/2 Average Risk   3.4   3.3* 12/21/2007   Lab Results  Component Value Date   TRIG 47 12/21/2007    Lab Results  Component Value Date   CHOLHDL 3.6 12/21/2007   No results found for this basename: LDLDIRECT    No results found for this basename: DDIMER    Radiology/Studies: Dg Chest 2 View  03/25/2011  *RADIOLOGY REPORT*  Clinical Data: Arrhythmia.  Hypertension.  Nonsmoker.  CHEST - 2 VIEW  Comparison: 02/11/2011  Findings: Probable IVC filter.  Pacer / AICD device.  Atypical course of one of the wires is not significantly changed.  Midline trachea.  Mild cardiomegaly. No pleural effusion or pneumothorax.  Mild interstitial thickening is new since the prior exam.  Subpleural septal thickening / Kerley B lines. No lobar consolidation.  IMPRESSION:  1.  New interstitial thickening, favored to represent pulmonary venous congestion.  No overt congestive failure. 2.  Mild cardiomegaly.  Original Report Authenticated By: Consuello Bossier, M.D.     EKG: Ventricular tachycardia rate of 126.  Telemetry- ventricular tachycardia with intermittent ventricular pacing.  Device interrogation- STJ ICD model number [id number] DOI 07-29-10 by Dr. Graciela Husbands.  RA lead 1688 DOI 05-12-03, RV lead 1580 DOI 05-12-03, LV lead [id number] DOI 07-29-10.  Programmed VF only zone at 193bpm. Pt in VT at interrogation at a rate of 108 with occasional V Pacing.  Physical Exam: Blood pressure 125/76, pulse 108, temperature 98.2 F (36.8 C), temperature source Oral, resp. rate 19, SpO2 98.00%.     ASSESSMENT AND PLAN:  Assessment and plan per Dr. Graciela Husbands.  Signed, Gypsy Balsam 04/06/2011, 3:27 PM

## 2011-04-06 NOTE — Assessment & Plan Note (Signed)
Recurrent slow ventricular tachycardia today which was successfully converted to sinus rhythm by Dr. Ladona Ridgel with tachycardia pacing through the defibrillator

## 2011-04-07 ENCOUNTER — Telehealth: Payer: Self-pay | Admitting: Cardiology

## 2011-04-07 ENCOUNTER — Inpatient Hospital Stay (HOSPITAL_COMMUNITY): Payer: Medicare Other

## 2011-04-07 DIAGNOSIS — I2589 Other forms of chronic ischemic heart disease: Secondary | ICD-10-CM

## 2011-04-07 LAB — CARDIAC PANEL(CRET KIN+CKTOT+MB+TROPI)
CK, MB: 2.7 ng/mL (ref 0.3–4.0)
CK, MB: 2.5 ng/mL (ref 0.3–4.0)
Total CK: 49 U/L (ref 7–232)
Troponin I: <0.3 ng/mL (ref <0.30)
Troponin I: <0.3 ng/mL (ref <0.30)

## 2011-04-07 LAB — PROTIME-INR
INR: 3.02 — ABNORMAL HIGH (ref 0.00–1.49)
Prothrombin Time: 31.8 seconds — ABNORMAL HIGH (ref 11.6–15.2)

## 2011-04-07 LAB — BASIC METABOLIC PANEL
GFR calc Af Amer: 65 mL/min — ABNORMAL LOW (ref >90)
GFR calc non Af Amer: 56 mL/min — ABNORMAL LOW (ref >90)
Glucose, Bld: 109 mg/dL — ABNORMAL HIGH (ref 70–99)
Potassium: 3.6 mEq/L (ref 3.5–5.1)
Sodium: 140 mEq/L (ref 135–145)

## 2011-04-07 MED ORDER — AMIODARONE HCL 200 MG PO TABS
400.0000 mg | ORAL_TABLET | Freq: Two times a day (BID) | ORAL | Status: DC
  Filled 2011-04-07: qty 2

## 2011-04-07 MED ORDER — AMIODARONE HCL 150 MG/3ML IV SOLN
60.0000 mg/h | INTRAVENOUS | Status: DC
  Filled 2011-04-07: qty 9

## 2011-04-07 MED ORDER — WARFARIN SODIUM 1 MG PO TABS
0.5000 mg | ORAL_TABLET | Freq: Once | ORAL | Status: AC
  Filled 2011-04-07: qty 0.5

## 2011-04-07 MED ORDER — DEXTROSE 5 % IV SOLN
60.0000 mg/h | INTRAVENOUS | Status: DC
  Administered 2011-04-07 (×3): 60 mg/h via INTRAVENOUS

## 2011-04-07 MED ORDER — DEXTROSE 5 % IV SOLN
30.0000 mg/h | INTRAVENOUS | Status: DC
  Administered 2011-04-08 (×3): 30 mg/h via INTRAVENOUS
  Filled 2011-04-07: qty 9

## 2011-04-07 MED ORDER — ONDANSETRON HCL 4 MG PO TABS
4.0000 mg | ORAL_TABLET | Freq: Once | ORAL | Status: AC
  Administered 2011-04-07: 4 mg via ORAL
  Filled 2011-04-07: qty 1

## 2011-04-07 MED ORDER — MENTHOL 3 MG MT LOZG
1.0000 | LOZENGE | OROMUCOSAL | Status: DC | PRN
  Administered 2011-04-07: 3 mg via ORAL
  Filled 2011-04-07: qty 9

## 2011-04-07 MED ORDER — SODIUM CHLORIDE 0.9 % IJ SOLN
10.0000 mL | Freq: Two times a day (BID) | INTRAMUSCULAR | Status: DC
  Administered 2011-04-07 – 2011-04-08 (×2): 10 mL

## 2011-04-07 MED ORDER — DEXTROSE 5 % IV SOLN
30.0000 mg/h | INTRAVENOUS | Status: DC
  Filled 2011-04-07: qty 9

## 2011-04-07 MED ORDER — DEXTROSE 5 % IV SOLN
60.0000 mg/h | INTRAVENOUS | Status: DC
  Administered 2011-04-07 (×2): 60 mg/h via INTRAVENOUS
  Filled 2011-04-07: qty 9

## 2011-04-07 MED ORDER — AMIODARONE LOAD VIA INFUSION
150.0000 mg | Freq: Once | INTRAVENOUS | Status: AC
  Administered 2011-04-07: 150 mg via INTRAVENOUS

## 2011-04-07 MED ORDER — AMIODARONE LOAD VIA INFUSION
150.0000 mg | Freq: Once | INTRAVENOUS | Status: DC
  Filled 2011-04-07: qty 151.2

## 2011-04-07 MED ORDER — SODIUM CHLORIDE 0.9 % IJ SOLN
10.0000 mL | INTRAMUSCULAR | Status: DC | PRN
  Administered 2011-04-07: 10 mL

## 2011-04-07 MED ORDER — DEXTROSE 5 % IV SOLN
60.0000 mg/h | INTRAVENOUS | Status: DC
  Filled 2011-04-07: qty 9

## 2011-04-07 MED ORDER — SPIRONOLACTONE 12.5 MG HALF TABLET
12.5000 mg | ORAL_TABLET | Freq: Every day | ORAL | Status: DC
  Filled 2011-04-07: qty 1

## 2011-04-07 MED ORDER — DEXTROSE 5 % IV SOLN: 30.0000 mg/h | INTRAVENOUS | Status: DC

## 2011-04-07 MED ORDER — AMIODARONE LOAD VIA INFUSION: 150.0000 mg | Freq: Once | INTRAVENOUS | Status: DC

## 2011-04-07 MED ORDER — POTASSIUM CHLORIDE CRYS ER 20 MEQ PO TBCR
20.0000 meq | EXTENDED_RELEASE_TABLET | Freq: Once | ORAL | Status: AC
  Administered 2011-04-07: 20 meq via ORAL

## 2011-04-07 MED ORDER — POTASSIUM CHLORIDE 20 MEQ PO PACK: 20.0000 meq | PACK | Freq: Once | ORAL | Status: DC

## 2011-04-07 MED ORDER — AMIODARONE HCL 200 MG PO TABS
400.0000 mg | ORAL_TABLET | Freq: Two times a day (BID) | ORAL | Status: DC
  Administered 2011-04-07: 400 mg via ORAL
  Filled 2011-04-07 (×3): qty 2

## 2011-04-07 MED ORDER — SODIUM CHLORIDE 0.9 % IV SOLN
250.0000 mL | INTRAVENOUS | Status: DC
  Administered 2011-04-07: 250 mL via INTRAVENOUS

## 2011-04-07 MED ORDER — FUROSEMIDE 10 MG/ML IJ SOLN
40.0000 mg | Freq: Every day | INTRAMUSCULAR | Status: DC
  Filled 2011-04-07: qty 4

## 2011-04-07 NOTE — Progress Notes (Signed)
Subjective:  He reports feeling better- denies any SOB, chest pain.  Objective:  Vital Signs in the last 24 hours: Temp:  [98.2 F (36.8 C)-99.1 F (37.3 C)] 99.1 F (37.3 C) (11/06 2237) Pulse Rate:  [86-125] 101  (11/06 2237) Resp:  [17-23] 17  (11/07 0300) BP: (102-128)/(66-88) 104/66 mmHg (11/07 0300) SpO2:  [96 %-100 %] 96 % (11/07 0300) Weight:  [175 lb (79.379 kg)-179 lb (81.194 kg)] 175 lb (79.379 kg) (11/06 2237)  Intake/Output from previous day: Urine output:  1050 cc since last night-- negative balance( -1020cc)    Physical Exam: Pt is alert and oriented, NAD HEENT: normal Neck: JVP - normal, carotids 2+= without bruits Lungs: CTA bilaterally CV: Tachycardic, regular rhythm without murmur or gallop Abd: soft, NT, Positive BS, no hepatomegaly Ext: no C/C/E, distal pulses intact and equal Skin: warm/dry no rash   Lab Results:  Basename 04/06/11 1345  WBC 9.2  HGB 12.8*  PLT 265    Basename 04/06/11 1345  NA 139  K 4.6  CL 104  CO2 25  GLUCOSE 110*  BUN 18  CREATININE 1.41*    Basename 04/07/11 0010  TROPONINI <0.30   Hepatic Function Panel No results found for this basename: PROT,ALBUMIN,AST,ALT,ALKPHOS,BILITOT,BILIDIR,IBILI in the last 72 hours No results found for this basename: CHOL in the last 72 hours No results found for this basename: PROTIME in the last 72 hours  Imaging: 11/6: CXR: mild /developing pulmonary interstitial edema.  Cardiac Studies:  Assessment/Plan:   70 y/o man with PMH significant for recurrent V Tach s/p attempted ablation on 02/12/11 and multiple ICD shocks, discharged on amiodarone and mexiletine during his last admission in 09/12 presented to the [facility] clinic with fatigue and SOB on 11/6 due to incessant Ventricular tachycardia.  1. Ventricular tachycardia : This is incessant. Patient continues to be in ventricular tachycardia with HR 's in 110-120's.     Plan: Currently on amiodarone boluses.            Will  start amiodarone drip.            Anticipate repeat catheter ablation later this week if   VT persists 2. Cardiomyopathy, ischemic ; stable    Plan: Continue current meds- coreg, cozaar, imdur, crestor and spironolactone. 3. Acute on chronic systolic heart failure : likely secondary to incessant ventricular tachycardia    Plan: Continue lasix at home dose.  4. ICD (implantable cardiac defibrillator), biventricular,[employer] (11/03/2010); Device was evaluated yesterday and seems to be stable.     Plan: Attempt to pace terminate VT yesterday were unsuccessful.     SAWHNEY,MEGHA, M.D. 04/07/2011, 6:16 AM   ATTENDING:  Pt seen, examined, and plan formulated with Housestaff. Pt admitted with recurrent incessant VT.  He remains in slow VT overnight despite IV amiodarone boluses. Will start IV amiodarone drip today.  Continue mexilitine and coreg. Will place PICC line on R side for IV amiodarone infusion as pt reports pain with IV amiodarone boluses. Will likely require repeat catheter ablation.  Will follow over next 24 hours and discuss with Dr Graciela Husbands.  Co Sign: Hillis Range, MD 04/07/2011 8:14 AM

## 2011-04-07 NOTE — Procedures (Signed)
Right upper extremity PICC line adjustment performed under fluoroscopic guidance utilizing guidewire assistance. Tip now in SVC/right atrial junction. No immediate complications. Fluoro time 0.6 minutes. Medications used 1% lidocaine.

## 2011-04-07 NOTE — Progress Notes (Signed)
ANTICOAGULATION CONSULT NOTE - Follow Up Consult  Pharmacy Consult for Coumadin Indication: Vtach with low EF(20-25%)  Allergies  Allergen Reactions  . Iohexol      Code: HIVES, Desc: hives w/ itching during cardiac cath '99, mult caths since w/ ? premeds, DR.G.HAYES REQUESTS 13 HR PRE MED//A.C.pt okay w/13 hr prep/mms, Onset Date: [id number]   . Prednisone     Patient Measurements: Height: 5\' 8"  (172.7 cm) Weight: 173 lb 15.1 oz (78.9 kg) IBW/kg (Calculated) : 68.4   Vital Signs: Temp: 98.4 F (36.9 C) (11/07 0809) Temp src: Oral (11/07 0809) BP: 92/71 mmHg (11/07 1300)  Labs:  Basename 04/07/11 1130 04/07/11 0550 04/07/11 0010 04/06/11 1345 04/06/11 1214  HGB -- -- -- 12.8* --  HCT -- -- -- 38.3* --  PLT -- -- -- 265 --  APTT -- -- -- -- --  LABPROT -- 31.8* -- -- --  INR -- 3.02* -- -- 4.4  HEPARINUNFRC -- -- -- -- --  CREATININE -- 1.27 -- 1.41* --  CKTOTAL 53 -- 53 -- --  CKMB 2.3 -- 2.7 -- --  TROPONINI <0.30 -- <0.30 -- --   Estimated Creatinine Clearance: 53.1 ml/min (by C-G formula based on Cr of 1.27).   Medications:  Scheduled:    . amiodarone  150 mg Intravenous Once  . amiodarone  150 mg Intravenous Once  . amiodarone  150 mg Intravenous Once  . amiodarone  400 mg Oral BID  . carvedilol  37.5 mg Oral BID WC  . docusate sodium  100 mg Oral Daily  . furosemide  40 mg Intravenous Daily  . furosemide  80 mg Intravenous Once  . isosorbide mononitrate  30 mg Oral Daily  . levothyroxine  88 mcg Oral Daily  . loratadine  10 mg Oral Daily  . losartan  50 mg Oral Daily  . meclizine  25 mg Oral BID  . mexiletine  150 mg Oral Q8H  . ondansetron  4 mg Oral Once  . potassium chloride SA  20 mEq Oral 3 times weekly  . potassium chloride  20 mEq Oral Once  . rosuvastatin  5 mg Oral QODAY  . sodium chloride  10 mL Intracatheter Q12H  . sodium chloride  3 mL Intravenous Q12H  . spironolactone  12.5 mg Oral Daily  . vitamin C  500 mg Oral Daily  . DISCONTD:  amiodarone  150 mg Intravenous Once  . DISCONTD: amiodarone  150 mg Intravenous Once  . DISCONTD: amiodarone  150 mg Intravenous Once  . DISCONTD: furosemide  40 mg Intravenous Q8H  . DISCONTD: Loratadine   Oral Daily  . DISCONTD: losartan  50 mg Oral Daily  . DISCONTD: potassium chloride  20 mEq Oral Once  . DISCONTD: spironolactone  12.5 mg Oral Daily  . DISCONTD: spironolactone  12.5 mg Oral Daily  . DISCONTD: warfarin  1 mg Oral Daily   Infustions:    . sodium chloride 250 mL (04/07/11 0800)  . amiodarone (CORDARONE) infusion 60 mg/hr (04/07/11 0856)   And  . amiodarone (CORDARONE) infusion    . DISCONTD: sodium chloride    . DISCONTD: amiodarone (CORDARONE) infusion    . DISCONTD: amiodarone (CORDARONE) infusion     PRN: acetaminophen, ALPRAZolam, ondansetron (ZOFRAN) IV, sodium chloride, sodium chloride  Assessment: Mr. Shiflet is a 70 year old gentleman on chronic coumadin for persistent ventricular tachycardia s/p ICD placement, previous MI, and stent placement x2. INR 11/6 4.04, INR 11/7 3.02. CBC unavailable today, but  no overt signs of bleeding. Goal of Therapy:  INR 2-3   Plan:  With downtrend in INR and concomitant start of amiodarone in hospital, give warfarin 0.5 mg x1 PO tonight, which is half of home dose of 1 mg daily. F/u INR, CBC tomorrow.  Katrinka Blazing [place] R 04/07/2011,2:19 PM

## 2011-04-07 NOTE — Telephone Encounter (Signed)
[facility] called about mexiletine please call back

## 2011-04-07 NOTE — Plan of Care (Signed)
Pt sys BP @ 75, Dr called @ 04/07/11 2135, Bradely PA relayed she would write orders to change IV amiodarone to 30mg /hr and review the 2200 meds, specifically the PO amiodarone.

## 2011-04-07 NOTE — Telephone Encounter (Signed)
Tried to call [facility] back x 2 and no answer.  Faxed signed form back to [facility] that patient is to be on Amiodarone and Mexiletine both.

## 2011-04-08 DIAGNOSIS — F419 Anxiety disorder, unspecified: Secondary | ICD-10-CM

## 2011-04-08 DIAGNOSIS — I472 Ventricular tachycardia: Secondary | ICD-10-CM

## 2011-04-08 DIAGNOSIS — I2510 Atherosclerotic heart disease of native coronary artery without angina pectoris: Secondary | ICD-10-CM

## 2011-04-08 LAB — BASIC METABOLIC PANEL WITH GFR
BUN: 26 mg/dL — ABNORMAL HIGH (ref 6–23)
CO2: 28 meq/L (ref 19–32)
Calcium: 9.6 mg/dL (ref 8.4–10.5)
Chloride: 98 meq/L (ref 96–112)
Creatinine, Ser: 1.34 mg/dL (ref 0.50–1.35)
GFR calc Af Amer: 61 mL/min — ABNORMAL LOW
GFR calc non Af Amer: 52 mL/min — ABNORMAL LOW
Glucose, Bld: 123 mg/dL — ABNORMAL HIGH (ref 70–99)
Potassium: 3.8 meq/L (ref 3.5–5.1)
Sodium: 134 meq/L — ABNORMAL LOW (ref 135–145)

## 2011-04-08 LAB — CBC
MCV: 95.3 fL (ref 78.0–100.0)
Platelets: 242 10*3/uL (ref 150–400)
RBC: 4.49 MIL/uL (ref 4.22–5.81)
WBC: 9.3 10*3/uL (ref 4.0–10.5)

## 2011-04-08 LAB — PROTIME-INR
INR: 2.75 — ABNORMAL HIGH (ref 0.00–1.49)
Prothrombin Time: 29.5 s — ABNORMAL HIGH (ref 11.6–15.2)

## 2011-04-08 MED ORDER — SODIUM CHLORIDE 0.9 % IV SOLN: INTRAVENOUS | Status: DC

## 2011-04-08 MED ORDER — DEXTROSE 5 % IV SOLN
30.0000 mg/h | INTRAVENOUS | Status: DC
  Administered 2011-04-08: 30 mg/h via INTRAVENOUS
  Filled 2011-04-08 (×2): qty 9

## 2011-04-08 MED ORDER — ISOSORBIDE MONONITRATE ER 30 MG PO TB24: 30.0000 mg | ORAL_TABLET | Freq: Every day | ORAL | Status: DC

## 2011-04-08 MED ORDER — FUROSEMIDE 10 MG/ML IJ SOLN: 40.0000 mg | Freq: Every day | INTRAMUSCULAR | Status: DC

## 2011-04-08 MED ORDER — SODIUM CHLORIDE 0.9 % IV BOLUS (SEPSIS)
250.0000 mL | Freq: Once | INTRAVENOUS | Status: AC
  Administered 2011-04-08: 250 mL via INTRAVENOUS

## 2011-04-08 NOTE — Discharge Summary (Signed)
Patient ID: [name],  MRN: [id number], DOB/AGE: 07-20-1940 70 y.o.  Admit date: 04/06/2011 Discharge date: 04/08/2011  Discharge Diagnoses Principal Problem:  *Ventricular tachycardia Active Problems:  ABDOMINAL AORTIC ANEURYSM  Cardiomyopathy, ischemic  Acute on chronic systolic heart failure  LBBB  Chronic anticoagulation  ICD (implantable cardiac defibrillator), biventricular,[employer]  Sinus bradycardia  HTN (hypertension)  Coronary artery disease  Anxiety   Allergies Allergies  Allergen Reactions  . Iohexol      Code: HIVES, Desc: hives w/ itching during cardiac cath '99, mult caths since w/ ? premeds, DR.G.HAYES REQUESTS 13 HR PRE MED//A.C.pt okay w/13 hr prep/mms, Onset Date: [id number]   . Prednisone     Procedures none  History of Present Illness The patient presented with ventricular tachycardia to the office. His presenting symptoms for shortness of breath and fatigue. He was found to be in slow and incessant ventricular tachycardia which was successfully pace terminated but then immediately resumed. The patient had undergone VT ablation a couple of weeks ago for similar presenting slow VT. The following day he had fast VT and end up getting shocked. It certainly was apparently reprogrammed to eliminate shocks except for life-threatening ventricular tachycardia.   Hospital Course Despite multiple amiodarone boluses and subsequent infusion, pt remained in VT with rates in the 90's to low 100's.  He experienced assoc dyspnea and fatigue with VT.  He became hypotensive on the evening of 11/7, requiring IV fluid bolus.  After further discussion with electrophysiology both here and @ [facility], decision has been made to transfer the pt today to [facility] for further EP eval and probable VT ablation.  Discharge Vitals:  Blood pressure 102/73, pulse 101, temperature 97.9 F (36.6 C), temperature source Oral, resp. rate 19, height 5\' 8"  (1.727 m),  weight 171 lb 11.8 oz (77.9 kg), SpO2 100.00%.    Weight change: -3 lb 4.2 oz (-1.48 kg)  Labs:   Lab Results  Component Value Date   WBC 9.3 04/08/2011   HGB 14.4 04/08/2011   HCT 42.8 04/08/2011   MCV 95.3 04/08/2011   PLT 242 04/08/2011    Lab 04/08/11 0415  NA 134*  K 3.8  CL 98  CO2 28  BUN 26*  CREATININE 1.34  CALCIUM 9.6  PROT --  BILITOT --  ALKPHOS --  ALT --  AST --  GLUCOSE 123*   Lab Results  Component Value Date   CKTOTAL 49 04/07/2011   CKMB 2.5 04/07/2011   TROPONINI <0.30 04/07/2011    Lab Results  Component Value Date   CHOL  Value: 171        ATP III CLASSIFICATION:  <200     mg/dL   Desirable  621-308  mg/dL   Borderline High  >=657    mg/dL   High 8/46/9629   Lab Results  Component Value Date   HDL 47 12/21/2007   Lab Results  Component Value Date   LDLCALC  Value: 115        Total Cholesterol/HDL:CHD Risk Coronary Heart Disease Risk Table                     Men   Women  1/2 Average Risk   3.4   3.3* 12/21/2007   Lab Results  Component Value Date   TRIG 47 12/21/2007   Lab Results  Component Value Date   CHOLHDL 3.6 12/21/2007   No results found for this basename: LDLDIRECT  Disposition:  Discharge Orders    Future Appointments: Provider: Department: Dept Phone: Center:   04/12/2011 9:00 AM [facility] [phone] [facility]   04/12/2011 10:45 AM Nada Libman, MD [facility] [phone] [facility]   04/16/2011 2:15 PM Tiffany Daneil Dan, RN [facility] [phone] None       Discharge Medications:    amiodarone 150 mg, IV, Once Ordered    carvedilol 37.5 mg, PO, BID WC Given: 11/07 0854    docusate sodium 100 mg, PO, Daily Given: 11/08 0937    furosemide 40 mg, IV, Daily Ordered    isosorbide mononitrate 30 mg, PO, Daily Ordered    levothyroxine 88 mcg, PO, Daily Given: 11/08 0937    loratadine (CLARITIN) tablet 10 mg 10 mg, PO, Daily Given: 11/08 0936    losartan 50 mg, PO, Daily Given: 11/07 1148    meclizine  25 mg, PO, BID Given: 11/08 0937    mexiletine 150 mg, PO, Q8H Given: 11/08 0622    potassium chloride SA 20 mEq, PO, 3 times weekly Given: 11/07 0855    rosuvastatin 5 mg, PO, QODAY Given: 11/07 1149    sodium chloride 10 mL, IK, Q12H Given: 11/08 1000    sodium chloride 3 mL, IV, Q12H Given: 11/07 0145    spironolactone (ALDACTONE) tablet 12.5 mg, PO, Daily Ordered    vitamin C 500 mg, PO, Daily Given: 11/08 4132    warfarin (COUMADIN) tablet 0.5 mg, PO, ONCE-1800 Ordered        Continuous         Medication Dose/Rate,Route,Frequency Last Action      0.9 %  sodium chloride infusion 10 mL/hr, IV, Continuous Restarted: 11/08 0100    amiodarone (CORDARONE) infusion 30 mg/hr, IV, Continuous New Bag: 11/08 0941    amiodarone (CORDARONE) infusion 30 mg/hr, IV, Continuous New Bag: 11/08 0700        PRN         Medication Dose/Rate,Route,Frequency Last Action      acetaminophen 650 mg, PO, Q4H PRN Ordered    ALPRAZolam 0.25 mg, PO, QID PRN Given: 11/08 4401    menthol-cetylpyridinium (CEPACOL) lozenge 3 mg 3 mg, PO, PRN Given: 11/07 1700    ondansetron 4 mg, IV, Q6H PRN Given: 11/08       Outstanding Labs/Studies  Duration of Discharge Encounter: Greater than 30 minutes including physician time.  Signed, Nicolasa Ducking NP 04/08/2011, 10:11 AM

## 2011-04-08 NOTE — Progress Notes (Signed)
Report given to Lupita Leash (Press photographer) on CCU at [facility]. [place] notified. Pt & family updated & aware. Dr Graciela Husbands at bedside. Pt awake, alert & oriented. VSS 111/65 BP.

## 2011-04-08 NOTE — Progress Notes (Signed)
Subjective:  He denies any chest pain, SOB.  Overnight his BP was ranging in 70's systolic was given 250 cc of NS bolus and the amiodarone drip rate was decreased to 30 from 60.  Objective:  Vital Signs in the last 24 hours: Temp:  [98.1 F (36.7 C)-98.8 F (37.1 C)] 98.1 F (36.7 C) (11/08 0000) Resp:  [12-22] 12  (11/08 0600) BP: (73-117)/(49-82) 96/65 mmHg (11/08 0600) SpO2:  [94 %-100 %] 97 % (11/08 0600) Weight:  [171 lb 11.8 oz (77.9 kg)] 171 lb 11.8 oz (77.9 kg) (11/08 0600)  Intake/Output from previous day: 11/07 0701 - 11/08 0700 In: 1283.4 [P.O.:780; I.V.:503.4] Out: 1900 [Urine:1900]  Physical Exam: Pt is alert and oriented, NAD HEENT: normal Neck: JVP - normal, carotids 2+= without bruits Lungs: CTA bilaterally CV: tachycardic, regular rhythm, without murmur or gallop Abd: soft, NT, Positive BS, no hepatomegaly Ext: no C/C/E, distal pulses intact and equal Skin: warm/dry no rash   Lab Results:  Basename 04/08/11 0415 04/06/11 1345  WBC 9.3 9.2  HGB 14.4 12.8*  PLT 242 265    Basename 04/08/11 0415 04/07/11 0550  NA 134* 140  K 3.8 3.6  CL 98 101  CO2 28 27  GLUCOSE 123* 109*  BUN 26* 17  CREATININE 1.34 1.27    Basename 04/07/11 1645 04/07/11 1130  TROPONINI <0.30 <0.30   Hepatic Function Panel No results found for this basename: PROT,ALBUMIN,AST,ALT,ALKPHOS,BILITOT,BILIDIR,IBILI in the last 72 hours No results found for this basename: CHOL in the last 72 hours No results found for this basename: PROTIME in the last 72 hours  Imaging: Imaging results have been reviewed  Cardiac Studies:  Assessment/Plan:  70 y/o man with PMH significant for recurrent V Tach s/p attempted ablation on 02/12/11 and multiple ICD shocks, discharged on amiodarone and mexiletine during his last admission in 09/12 presented to the [facility] clinic with fatigue and SOB on 11/6 due to incessant Ventricular tachycardia    1. Incessant Ventricular tachycardia   Assessment: He continues to be in VentricularTachycardia with HR  in  90- 100's    Plan: Titrate down amiodarone drip .  (To lower dose)           Continue coreg and mexiletene.           He will likely require repeat catheter ablation at some point.  2. Hypotension: His BP dropped to 70's systolic's' last night- got a bolus of 250 cc of NS. His evening dose for coreg were held in the setting of soft BP. Currently his BP is in 90's systolics. Plan : May consider decrease the dose for his lasix, coreg.           We may consider holding his cozaar. (Hold Cozar--JK)              3. Acute on chronic systolic heart failure: 2/2 incessant V T. He is 2lbs down in his weight from yesterday.Net negative balance with his I and O's.( Hold Lasix this AM Myrtis Ser)     Plan: We may consider decreasing the dose for his lasix in the setting of low BP overnight with net negative balance of 1.6 litres.            Strict I and O's.            Daily weights.  4. Ischemic Cardiomyopathy( EF-20-25% per echo in 2012): stable.  Plan: Continue current meds- coreg, cozaar, imdur, crestor and spironolactone. We may consider  decreasing readjusting the doses for his anti- hypertensive meds.  5. Dispo: likely transfer to [facility] for further management.   SAWHNEY,MEGHA, M.D. 04/08/2011, 6:27 AM  Patient seen and examined. I agree with the assessment and plan as detailed above. See also my additional thoughts below.   I have spoken with Dr. Johney Frame who is coming to see pt.  Willa Rough, MD, [facility] 04/08/2011 8:36 AM

## 2011-04-09 ENCOUNTER — Telehealth: Payer: Self-pay | Admitting: Internal Medicine

## 2011-04-09 NOTE — Discharge Summary (Signed)
ventriuclar tachycardia  For ablation

## 2011-04-09 NOTE — Telephone Encounter (Signed)
Faxed Discharge,H&P,Opp Note,LOV faxed to [facility] @ [phone]  04/09/11/km

## 2011-04-12 ENCOUNTER — Other Ambulatory Visit: Payer: Self-pay

## 2011-04-12 ENCOUNTER — Ambulatory Visit: Payer: Self-pay | Admitting: Surgery

## 2011-04-15 ENCOUNTER — Telehealth: Payer: Self-pay | Admitting: Cardiology

## 2011-04-15 NOTE — Telephone Encounter (Signed)
Follow up from previous call:  Patient returning call back to [facility], patient at [facility] now.   Follow up Appt with Dr. Patty Sermons will not be made at this time.

## 2011-04-15 NOTE — Telephone Encounter (Signed)
Noted. Spoke with Leanne and she didn't call patient.

## 2011-04-16 ENCOUNTER — Encounter: Payer: Medicare Other | Admitting: *Deleted

## 2011-04-18 NOTE — H&P (Signed)
Incessant VT   Admit for AMIO

## 2011-04-23 ENCOUNTER — Ambulatory Visit (INDEPENDENT_AMBULATORY_CARE_PROVIDER_SITE_OTHER): Payer: Medicare Other | Admitting: *Deleted

## 2011-04-23 DIAGNOSIS — I5022 Chronic systolic (congestive) heart failure: Secondary | ICD-10-CM

## 2011-04-23 DIAGNOSIS — I255 Ischemic cardiomyopathy: Secondary | ICD-10-CM

## 2011-04-23 DIAGNOSIS — I509 Heart failure, unspecified: Secondary | ICD-10-CM

## 2011-04-23 DIAGNOSIS — I498 Other specified cardiac arrhythmias: Secondary | ICD-10-CM

## 2011-04-23 DIAGNOSIS — Z95810 Presence of automatic (implantable) cardiac defibrillator: Secondary | ICD-10-CM

## 2011-04-23 DIAGNOSIS — R001 Bradycardia, unspecified: Secondary | ICD-10-CM

## 2011-04-23 DIAGNOSIS — I472 Ventricular tachycardia: Secondary | ICD-10-CM

## 2011-04-23 DIAGNOSIS — Z7901 Long term (current) use of anticoagulants: Secondary | ICD-10-CM

## 2011-04-23 DIAGNOSIS — I2589 Other forms of chronic ischemic heart disease: Secondary | ICD-10-CM

## 2011-04-23 MED ORDER — CARVEDILOL 6.25 MG PO TABS: 6.2500 mg | ORAL_TABLET | Freq: Two times a day (BID) | ORAL | Status: DC

## 2011-04-23 MED ORDER — COLCHICINE 0.6 MG PO TABS: 0.6000 mg | ORAL_TABLET | Freq: Two times a day (BID) | ORAL | Status: DC

## 2011-04-23 MED ORDER — SOTALOL HCL 80 MG PO TABS: 80.0000 mg | ORAL_TABLET | Freq: Two times a day (BID) | ORAL | Status: DC

## 2011-04-30 ENCOUNTER — Ambulatory Visit (INDEPENDENT_AMBULATORY_CARE_PROVIDER_SITE_OTHER): Payer: Medicare Other | Admitting: *Deleted

## 2011-04-30 DIAGNOSIS — Z7901 Long term (current) use of anticoagulants: Secondary | ICD-10-CM

## 2011-04-30 DIAGNOSIS — I255 Ischemic cardiomyopathy: Secondary | ICD-10-CM

## 2011-04-30 DIAGNOSIS — I2589 Other forms of chronic ischemic heart disease: Secondary | ICD-10-CM

## 2011-05-04 ENCOUNTER — Encounter: Payer: Self-pay | Admitting: Internal Medicine

## 2011-05-06 ENCOUNTER — Ambulatory Visit (INDEPENDENT_AMBULATORY_CARE_PROVIDER_SITE_OTHER): Payer: Medicare Other | Admitting: Internal Medicine

## 2011-05-06 ENCOUNTER — Ambulatory Visit (INDEPENDENT_AMBULATORY_CARE_PROVIDER_SITE_OTHER): Payer: Medicare Other | Admitting: *Deleted

## 2011-05-06 ENCOUNTER — Encounter: Payer: Self-pay | Admitting: Internal Medicine

## 2011-05-06 ENCOUNTER — Ambulatory Visit: Payer: Medicare Other | Admitting: Cardiology

## 2011-05-06 VITALS — BP 138/84 | HR 69 | Ht 68.0 in | Wt 178.0 lb

## 2011-05-06 DIAGNOSIS — I255 Ischemic cardiomyopathy: Secondary | ICD-10-CM

## 2011-05-06 DIAGNOSIS — I2589 Other forms of chronic ischemic heart disease: Secondary | ICD-10-CM

## 2011-05-06 DIAGNOSIS — Z7901 Long term (current) use of anticoagulants: Secondary | ICD-10-CM

## 2011-05-06 DIAGNOSIS — Z95810 Presence of automatic (implantable) cardiac defibrillator: Secondary | ICD-10-CM

## 2011-05-06 DIAGNOSIS — I5022 Chronic systolic (congestive) heart failure: Secondary | ICD-10-CM

## 2011-05-06 DIAGNOSIS — I498 Other specified cardiac arrhythmias: Secondary | ICD-10-CM

## 2011-05-06 DIAGNOSIS — I472 Ventricular tachycardia, unspecified: Secondary | ICD-10-CM

## 2011-05-06 DIAGNOSIS — R001 Bradycardia, unspecified: Secondary | ICD-10-CM

## 2011-05-06 LAB — ICD DEVICE OBSERVATION: DEV-0020ICD: NEGATIVE

## 2011-05-06 LAB — POCT INR: INR: 4

## 2011-05-06 MED ORDER — WARFARIN SODIUM 5 MG PO TABS: ORAL_TABLET | ORAL | Status: DC

## 2011-05-06 NOTE — Assessment & Plan Note (Signed)
He is back on beta blockers and ARB's. We will consider adding Aldactone next week following a check of his serum potassium level

## 2011-05-06 NOTE — Assessment & Plan Note (Signed)
The patient has had no recurrent ventricular tachycardia. We will continue him on sotalol for now.

## 2011-05-06 NOTE — Assessment & Plan Note (Signed)
Stable on current medications. We will discontinue his colchicine started following pericarditis

## 2011-05-06 NOTE — Assessment & Plan Note (Signed)
The patient's device was interrogated and the information was fully reviewed.  The device was reprogrammed to  Activate rate response

## 2011-05-06 NOTE — Assessment & Plan Note (Signed)
His heart rate was  Fixed at 70 beats per minute. We have activated rate response; however, it is limited to upper rate of 85 for right now. This could be potentially adjusted it he has no recurrent ventricular tachycardia

## 2011-05-06 NOTE — Progress Notes (Signed)
HPI  [name] is a 70 y.o. male Is seen in followup for recurrent ventricular tachycardia. He has been on multiple medications in the past, and underwent catheter ablation in the setting of VT storm September 2012. He had post ablation ventricular tachycardia and was started on amiodarone which had previously not been used because of concurrent hypothyroidism. This is been treated intercurrently by Dr. Evlyn Kanner.he has been continued on mexiletine in conjunction with his amiodarone  Because of recurrent ventricular tachycardia he was transferred to [facility] for incessant VT and underwent an ablation complicated by pericardial perforation and subsequent pericarditis. He is done relatively well gradually regaining his strength since discharge. Interrogation of his device demonstrates no intercurrent VT  His history of coronary artery disease with prior MI in 1999 treated percutaneously stenting to his RCA in 2005. Last cath in 2010 showing patent stents in the circumflex  and right coronary artery.  Ischemic cardiomyopathy with ejection fraction of 25-30% by echo in February 2012,     Past Medical History  Diagnosis Date  . CAD (coronary artery disease) 1999    Remote anterolater and apical MI, cath 2011 patent stents RCA/LCx  LAD w/o obstruction  . Cardiomyopathy, ischemic     EF 20-25%  echo 2012  . Chronic systolic congestive heart failure   . Ventricular tachycardia     recurrent slow VT at 100-110 bpm,  Rx mexilitene/sotalol  . ICD (implantable cardiac defibrillator) in place     [place] [facility]  . 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 Abdominal aortic aneurysm 2009    Repaired with endovascular stent  . PVD (peripheral vascular disease)     Has occluded innominate vein  . Left bundle branch block     Past Surgical History  Procedure Date  . Icd Feb 2012    Change out with LV lead placed with tunneling from the right  to the left side  . Endovascular stent insertion     for AAA    . Cardiac catheterization     x2  . Pacemaker insertion     Current Outpatient Prescriptions  Medication Sig Dispense Refill  . ALPRAZolam (XANAX) 0.25 MG tablet Take 1 tablet (0.25 mg total) by mouth 4 (four) times daily as needed.  360 tablet  1  . Ascorbic Acid (VITAMIN C) 500 MG tablet Take 500 mg by mouth daily.        . carvedilol (COREG) 6.25 MG tablet Take 1 tablet (6.25 mg total) by mouth 2 (two) times daily.  270 tablet  0  . colchicine 0.6 MG tablet Take 1 tablet (0.6 mg total) by mouth 2 (two) times daily. For pericarditis  60 tablet  0  . DOCUSATE CALCIUM PO Take 81 mg by mouth daily.       [name] levothyroxine (SYNTHROID, LEVOTHROID) 88 MCG tablet Take 88 mcg by mouth daily.        . Loratadine (CLARITIN) 10 MG CAPS Take by mouth daily.        [name] losartan (COZAAR) 50 MG tablet Take 25 mg by mouth daily.        . meclizine (ANTIVERT) 25 MG tablet TAKE 1 TABLET THREE TIMES A DAY AS NEEDED  270 tablet  3  . MULTIPLE VITAMIN PO Take by mouth daily.        . Omega-3 Fatty Acids (FISH OIL) 1000 MG CAPS Take by mouth 2 (two) times daily.        [name]  potassium chloride SA (K-DUR,KLOR-CON) 20 MEQ tablet Take 20 mEq by mouth daily. Monday, Wednesday, friday      . rosuvastatin (CRESTOR) 5 MG tablet Take 5 mg by mouth every other day.        . sotalol (BETAPACE) 80 MG tablet Take 1 tablet (80 mg total) by mouth 2 (two) times daily.  30 tablet  0  . warfarin (COUMADIN) 5 MG tablet As directed by the coumadin clinic   30 tablet  11    Allergies  Allergen Reactions  . Iohexol      Code: HIVES, Desc: hives w/ itching during cardiac cath '99, mult caths since w/ ? premeds, DR.G.HAYES REQUESTS 13 HR PRE MED//A.C.pt okay w/13 hr prep/mms, Onset Date: 16109604   . Prednisone     Review of Systems negative except from HPI and PMH  Physical Exam Well developed and well nourished in no acute distress HENT normal E scleral and icterus clear Neck Supple JVP flat; carotids brisk and full Clear  to ausculation Regular rate and rhythm, no murmurs gallops or rub Soft with active bowel sounds No clubbing cyanosis and edema Alert and oriented, grossly normal motor and sensory function Skin Warm and Dry   Assessment and  Plan

## 2011-05-06 NOTE — Patient Instructions (Addendum)
Stop Colchicine Hold Imdur Return for Lab work on 12/13.   Return to see Dr.Klein in 2 months. 07/21/2011 at 330 pm

## 2011-05-13 ENCOUNTER — Ambulatory Visit (INDEPENDENT_AMBULATORY_CARE_PROVIDER_SITE_OTHER): Payer: Medicare Other | Admitting: *Deleted

## 2011-05-13 DIAGNOSIS — I5022 Chronic systolic (congestive) heart failure: Secondary | ICD-10-CM

## 2011-05-13 DIAGNOSIS — I2589 Other forms of chronic ischemic heart disease: Secondary | ICD-10-CM

## 2011-05-13 DIAGNOSIS — I714 Abdominal aortic aneurysm, without rupture: Secondary | ICD-10-CM

## 2011-05-13 DIAGNOSIS — I255 Ischemic cardiomyopathy: Secondary | ICD-10-CM

## 2011-05-13 DIAGNOSIS — Z7901 Long term (current) use of anticoagulants: Secondary | ICD-10-CM

## 2011-05-13 LAB — BASIC METABOLIC PANEL
BUN: 13 mg/dL (ref 6–23)
CO2: 29 mEq/L (ref 19–32)
Calcium: 9 mg/dL (ref 8.4–10.5)
Chloride: 105 mEq/L (ref 96–112)
Creatinine, Ser: 1.2 mg/dL (ref 0.4–1.5)
Glucose, Bld: 84 mg/dL (ref 70–99)

## 2011-05-17 ENCOUNTER — Telehealth: Payer: Self-pay | Admitting: Physician Assistant

## 2011-05-17 ENCOUNTER — Emergency Department (HOSPITAL_COMMUNITY)
Admission: EM | Admit: 2011-05-17 | Discharge: 2011-05-18 | Disposition: A | Discharge status: Home / Self Care | Payer: Medicare Other | Attending: Emergency Medicine | Admitting: Emergency Medicine

## 2011-05-17 ENCOUNTER — Other Ambulatory Visit: Payer: Self-pay

## 2011-05-17 ENCOUNTER — Encounter (HOSPITAL_COMMUNITY): Payer: Self-pay | Admitting: Emergency Medicine

## 2011-05-17 DIAGNOSIS — I4949 Other premature depolarization: Secondary | ICD-10-CM | POA: Insufficient documentation

## 2011-05-17 DIAGNOSIS — I2510 Atherosclerotic heart disease of native coronary artery without angina pectoris: Secondary | ICD-10-CM | POA: Insufficient documentation

## 2011-05-17 DIAGNOSIS — R0602 Shortness of breath: Secondary | ICD-10-CM | POA: Insufficient documentation

## 2011-05-17 DIAGNOSIS — I5022 Chronic systolic (congestive) heart failure: Secondary | ICD-10-CM | POA: Insufficient documentation

## 2011-05-17 DIAGNOSIS — I509 Heart failure, unspecified: Secondary | ICD-10-CM | POA: Insufficient documentation

## 2011-05-17 DIAGNOSIS — R5381 Other malaise: Secondary | ICD-10-CM | POA: Insufficient documentation

## 2011-05-17 DIAGNOSIS — R5383 Other fatigue: Secondary | ICD-10-CM | POA: Insufficient documentation

## 2011-05-17 DIAGNOSIS — I10 Essential (primary) hypertension: Secondary | ICD-10-CM | POA: Insufficient documentation

## 2011-05-17 DIAGNOSIS — R55 Syncope and collapse: Secondary | ICD-10-CM | POA: Insufficient documentation

## 2011-05-17 DIAGNOSIS — Z95810 Presence of automatic (implantable) cardiac defibrillator: Secondary | ICD-10-CM | POA: Insufficient documentation

## 2011-05-17 NOTE — ED Notes (Signed)
PT. REPORTS HYPERTENSION THIS EVENING = 158/105 , FELT DIZZY / PRE SYNCOPE THIS EVENING ,  DENIES CHEST PAIN . SLIGHT SOB .

## 2011-05-17 NOTE — Telephone Encounter (Signed)
Received call from patient's wife. He had ablation done 04/2011. This afternoon had an abrupt episode of feeling faint then feeling like he was going to truly pass out. BP 150's, HR 80's afterwards when checked and now he is having PVCs. Defib did not fire but patient says it is checked very high. I advised to proceed to ER for further eval. They expressed undertstanding and gratitude.

## 2011-05-18 ENCOUNTER — Telehealth: Payer: Self-pay | Admitting: Internal Medicine

## 2011-05-18 DIAGNOSIS — I472 Ventricular tachycardia: Secondary | ICD-10-CM

## 2011-05-18 DIAGNOSIS — R5383 Other fatigue: Secondary | ICD-10-CM

## 2011-05-18 DIAGNOSIS — R5381 Other malaise: Secondary | ICD-10-CM

## 2011-05-18 DIAGNOSIS — I059 Rheumatic mitral valve disease, unspecified: Secondary | ICD-10-CM

## 2011-05-18 DIAGNOSIS — I4729 Other ventricular tachycardia: Secondary | ICD-10-CM

## 2011-05-18 LAB — CBC
HCT: 37.3 % — ABNORMAL LOW (ref 39.0–52.0)
Hemoglobin: 12.1 g/dL — ABNORMAL LOW (ref 13.0–17.0)
MCH: 30.9 pg (ref 26.0–34.0)
MCHC: 32.4 g/dL (ref 30.0–36.0)
MCV: 95.2 fL (ref 78.0–100.0)
Platelets: 266 10*3/uL (ref 150–400)
RBC: 3.92 MIL/uL — ABNORMAL LOW (ref 4.22–5.81)
RDW: 13.5 % (ref 11.5–15.5)
WBC: 9.4 10*3/uL (ref 4.0–10.5)

## 2011-05-18 LAB — DIFFERENTIAL
Basophils Absolute: 0.1 10*3/uL (ref 0.0–0.1)
Basophils Relative: 1 % (ref 0–1)
Eosinophils Absolute: 0.2 10*3/uL (ref 0.0–0.7)
Eosinophils Relative: 2 % (ref 0–5)
Lymphocytes Relative: 14 % (ref 12–46)
Lymphs Abs: 1.3 10*3/uL (ref 0.7–4.0)
Monocytes Absolute: 0.7 10*3/uL (ref 0.1–1.0)
Monocytes Relative: 8 % (ref 3–12)
Neutro Abs: 7.1 10*3/uL (ref 1.7–7.7)
Neutrophils Relative %: 76 % (ref 43–77)

## 2011-05-18 LAB — COMPREHENSIVE METABOLIC PANEL
ALT: 19 U/L (ref 0–53)
AST: 19 U/L (ref 0–37)
Albumin: 3.6 g/dL (ref 3.5–5.2)
Alkaline Phosphatase: 53 U/L (ref 39–117)
BUN: 17 mg/dL (ref 6–23)
CO2: 25 mEq/L (ref 19–32)
Calcium: 9.5 mg/dL (ref 8.4–10.5)
Chloride: 103 mEq/L (ref 96–112)
Creatinine, Ser: 1.24 mg/dL (ref 0.50–1.35)
GFR calc Af Amer: 67 mL/min — ABNORMAL LOW (ref >90)
GFR calc non Af Amer: 58 mL/min — ABNORMAL LOW (ref >90)
Glucose, Bld: 95 mg/dL (ref 70–99)
Potassium: 4.4 mEq/L (ref 3.5–5.1)
Sodium: 137 mEq/L (ref 135–145)
Total Bilirubin: 0.2 mg/dL — ABNORMAL LOW (ref 0.3–1.2)
Total Protein: 7.3 g/dL (ref 6.0–8.3)

## 2011-05-18 LAB — POCT I-STAT TROPONIN I

## 2011-05-18 LAB — APTT: aPTT: 41 seconds — ABNORMAL HIGH (ref 24–37)

## 2011-05-18 LAB — PROTIME-INR
INR: 2.44 — ABNORMAL HIGH (ref 0.00–1.49)
Prothrombin Time: 26.9 seconds — ABNORMAL HIGH (ref 11.6–15.2)

## 2011-05-18 MED ORDER — FUROSEMIDE 20 MG PO TABS
40.0000 mg | ORAL_TABLET | Freq: Once | ORAL | Status: AC
  Administered 2011-05-18: 40 mg via ORAL
  Filled 2011-05-18: qty 2

## 2011-05-18 MED ORDER — FUROSEMIDE 20 MG PO TABS: 20.0000 mg | ORAL_TABLET | ORAL | Status: DC

## 2011-05-18 MED ORDER — FUROSEMIDE 10 MG/ML IJ SOLN
20.0000 mg | Freq: Once | INTRAMUSCULAR | Status: DC
  Filled 2011-05-18: qty 2

## 2011-05-18 NOTE — Telephone Encounter (Signed)
Seen with Dr Graciela Husbands in ER, see his note for details.

## 2011-05-18 NOTE — ED Notes (Signed)
Pt and family member anxious for discharge. Echo results pending. Echo lab notified of need for results prior to pt discharge. Family and pt informed of delay.

## 2011-05-18 NOTE — ED Notes (Signed)
Pt and family member doing ok; no needs at this time

## 2011-05-18 NOTE — ED Notes (Signed)
MD at bedside. 

## 2011-05-18 NOTE — ED Notes (Signed)
PA at bedside, reviewing results of echo with patient.

## 2011-05-18 NOTE — ED Notes (Signed)
Pt resting; wife at bedside; pt waiting for Dr. Graciela Husbands to come and readjust his pacemaker

## 2011-05-18 NOTE — ED Notes (Signed)
Patient is resting comfortably. 

## 2011-05-18 NOTE — ED Notes (Signed)
[facility] is here to interrogate pacemaker.

## 2011-05-18 NOTE — ED Provider Notes (Signed)
History    this is a 70 year old gentleman with a significant cardiac history presenting to the ED with chief complaints of near syncope and palpitations. Patient states last night he felt that his heart rate was faster than usual and he felt very tired. He did check his blood pressure and noticed that it was in the 160 systolic. His baseline blood pressure is usually in the 120 systolic. He also noticed some slight shortness of breath which has improved. Patient states he is back to his normal baseline currently. Patient has a cardiac ablation 2 and half weeks ago. He is currently on blood thinner medication. He denies fever, headache, chest pain, abdominal pain, nausea, vomiting, diarrhea or dysuria, tingling or numbness sensation.    CSN: [id number] Arrival date & time: 05/17/2011 10:22 PM   First MD Initiated Contact with Patient 05/18/11 [phone]      Chief Complaint  Patient presents with  . Hypertension    (Consider location/radiation/quality/duration/timing/severity/associated sxs/prior treatment) HPI  Past Medical History  Diagnosis Date  . CAD (coronary artery disease) 1999    Remote anterolater and apical MI, cath 2011 patent stents RCA/LCx  LAD w/o obstruction  . Cardiomyopathy, ischemic     EF 20-25%  echo 2012  . Chronic systolic congestive heart failure   . Ventricular tachycardia     recurrent slow VT at 100-110 bpm,  Rx mexilitene/sotalol  . ICD (implantable cardiac defibrillator) in place     [place] [facility]  . 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 Abdominal aortic aneurysm 2009    Repaired with endovascular stent  . PVD (peripheral vascular disease)     Has occluded innominate vein  . Left bundle branch block     Past Surgical History  Procedure Date  . Icd Feb 2012    Change out with LV lead placed with tunneling from the right  to the left side  . Endovascular stent insertion     for AAA  . Cardiac catheterization     x2  . Pacemaker insertion     Family  History  Problem Relation Age of Onset  . Stroke Mother   . Heart attack Father   . Heart attack Mother     History  Substance Use Topics  . Smoking status: Former Games developer  . Smokeless tobacco: Former Neurosurgeon    Quit date: 06/01/1987  . Alcohol Use: Yes     very rare      Review of Systems  All other systems reviewed and are negative.    Allergies  Iohexol and Prednisone  Home Medications   Current Outpatient Rx  Name Route Sig Dispense Refill  . ALPRAZOLAM 0.25 MG PO TABS Oral Take 0.25 mg by mouth 4 (four) times daily as needed. For anxiety     . VITAMIN C 500 MG PO TABS Oral Take 500 mg by mouth daily.      [name] CARVEDILOL 6.25 MG PO TABS Oral Take 1 tablet (6.25 mg total) by mouth 2 (two) times daily. 270 tablet 0    NEW DOSE  . DOCUSATE CALCIUM PO Oral Take 81 mg by mouth daily.     [name] LEVOTHYROXINE SODIUM 88 MCG PO TABS Oral Take 88 mcg by mouth daily.     [name] LORATADINE 10 MG PO CAPS Oral Take 1 capsule by mouth daily.     [name] LOSARTAN POTASSIUM 50 MG PO TABS Oral Take 25 mg by mouth daily.      [name]  MECLIZINE HCL 25 MG PO TABS Oral Take 25 mg by mouth 2 (two) times daily.      . MULTIPLE VITAMIN PO Oral Take 1 tablet by mouth daily.     [name] FISH OIL 1000 MG PO CAPS Oral Take by mouth 2 (two) times daily.      [name] POTASSIUM CHLORIDE CRYS CR 20 MEQ PO TBCR Oral Take 20 mEq by mouth daily. Monday, Wednesday, friday    . ROSUVASTATIN CALCIUM 5 MG PO TABS Oral Take 5 mg by mouth every other day.      [name] SOTALOL HCL 80 MG PO TABS Oral Take 1 tablet (80 mg total) by mouth 2 (two) times daily. 30 tablet 0  . WARFARIN SODIUM 5 MG PO TABS Oral Take 1.25-2.5 mg by mouth daily. As directed by the coumadin clinic, take 1.25 mg on Tuesday and Saturday and 2.5 mg the remaining days        BP 135/91  Pulse 72  Temp(Src) 98.7 F (37.1 C) (Oral)  Resp 19  SpO2 97%  Physical Exam  Nursing note and vitals reviewed. Constitutional:       Awake, alert, nontoxic appearance  HENT:  Head:  Atraumatic.  Eyes: Right eye exhibits no discharge. Left eye exhibits no discharge.  Neck: Neck supple.  Cardiovascular: An irregular rhythm present.  Pulmonary/Chest: Effort normal and breath sounds normal. He exhibits no tenderness.  Abdominal: There is no tenderness. There is no rebound.  Musculoskeletal: He exhibits no tenderness.       Baseline ROM, no obvious new focal weakness  Neurological:       Mental status and motor strength appears baseline for patient and situation  Skin: No rash noted.  Psychiatric: He has a normal mood and affect.    ED Course  Procedures (including critical care time)  Labs Reviewed  COMPREHENSIVE METABOLIC PANEL - Abnormal; Notable for the following:    Total Bilirubin 0.2 (*)    GFR calc non Af Amer 58 (*)    GFR calc Af Amer 67 (*)    All other components within normal limits  CBC - Abnormal; Notable for the following:    RBC 3.92 (*)    Hemoglobin 12.1 (*)    HCT 37.3 (*)    All other components within normal limits  APTT - Abnormal; Notable for the following:    aPTT 41 (*)    All other components within normal limits  PROTIME-INR - Abnormal; Notable for the following:    Prothrombin Time 26.9 (*)    INR 2.44 (*)    All other components within normal limits  DIFFERENTIAL  POCT I-STAT TROPONIN I  I-STAT TROPONIN I   No results found.   No diagnosis found.   Date: 05/18/2011  Rate: 104  Rhythm: ventricular-paced rhythm  QRS Axis: left  Intervals: indeterminate  ST/T Wave abnormalities: indeterminate  Conduction Disutrbances:PVC, indeterminate, pacing  Narrative Interpretation:   Old EKG Reviewed: unchanged    MDM  Patient with recent cardiac ablation presenting with elevated blood pressure and fatigue. This symptom has improved overnight. His lab work was unremarkable. I'll consult with his cardiologist for further management.  7:17 AM Pt has a Scientist, research (life sciences).  I will have the pacer interrogate, and will also cycle  another set of troponin.  Pt currently in NAD.    7:24 AM I have spoken with the [facility] representative who knows the patient well. The representative will see patient in the awl and  will interrogate the pacemaker as appropriate.  8:05 AM The representatives from [facility] pacemaker has seen and interrogate the patient's pacemaker. No obvious finding was obtained. The representatives noticed patient well. States that the patient has a very complicated cardiac history. The representative will also contact Dr. Graciela Husbands today to have the patient further evaluated.  8:29 AM I have consulted with [facility], who agrees to contact pt to have outpatient follow up.  Dr. Graciela Husbands has seen pt in ed.  He ordered a cardiac echo.  He request to give Lasix 20mg  IV, and to check magnesium level.  If cardiac echo is normal, pt is to be discharged.    3:01 PM 2D echo shows Normal LV size with moderate to severe systolic dysfunction, EF 30%. Mild LV hypertrophy. Periapical akinesis as described above. The apex itself was not well-visualized. Septal-lateral dyssynchrony. Moderate diastolic dysfunction with evidence for elevated LV filling pressure. Normal RV size with mild systolic dysfunction. .  I have spoken with Dr. Ladona Ridgel, cardiologist, who review echo finding and sts there's no new changes, and recommend pt to f/u.  I discussed with family member and pt, who will f/u with Dr. Graciela Husbands this Friday.  Pt will be discharge.  He is in NAD, VSS.    Fayrene Helper, PA 05/18/11 [phone]

## 2011-05-18 NOTE — Progress Notes (Signed)
  Echocardiogram 2D Echocardiogram has been performed.  Debera Sterba Nira Retort 05/18/2011, 1:31 PM

## 2011-05-18 NOTE — Consult Note (Signed)
HPI  [name] is a 70 y.o. male Seen in the ER because of a spell of weakness with elevated blood pressure  He is sp recentr RFCA for incessant VT  at [facility] complicated by perforation and tamponade; makrked improvement  He has however had increasing burden of PVC and VTNS now comprising >28% of beats since the discontinuation of his antiarrhtymics at that time  His SOB remains stable but new over the last 3-4 months   Denies chest pain   Past Medical History  Diagnosis Date  . CAD (coronary artery disease) 1999    Remote anterolater and apical MI, cath 2011 patent stents RCA/LCx  LAD w/o obstruction  . Cardiomyopathy, ischemic     EF 20-25%  echo 2012  . Chronic systolic congestive heart failure   . Ventricular tachycardia     recurrent slow VT at 100-110 bpm,  Rx mexilitene/sotalol  . ICD (implantable cardiac defibrillator) in place     [place] [facility]  . 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 Abdominal aortic aneurysm 2009    Repaired with endovascular stent  . PVD (peripheral vascular disease)     Has occluded innominate vein  . Left bundle branch block     Past Surgical History  Procedure Date  . Icd Feb 2012    Change out with LV lead placed with tunneling from the right  to the left side  . Endovascular stent insertion     for AAA  . Cardiac catheterization     x2  . Pacemaker insertion     No current facility-administered medications for this encounter.   Current Outpatient Prescriptions  Medication Sig Dispense Refill  . ALPRAZolam (XANAX) 0.25 MG tablet Take 0.25 mg by mouth 4 (four) times daily as needed. For anxiety       . Ascorbic Acid (VITAMIN C) 500 MG tablet Take 500 mg by mouth daily.        . carvedilol (COREG) 6.25 MG tablet Take 1 tablet (6.25 mg total) by mouth 2 (two) times daily.  270 tablet  0  . DOCUSATE CALCIUM PO Take 81 mg by mouth daily.       [name] levothyroxine (SYNTHROID, LEVOTHROID) 88 MCG tablet Take 88 mcg by mouth daily.       .  Loratadine (CLARITIN) 10 MG CAPS Take 1 capsule by mouth daily.       [name] losartan (COZAAR) 50 MG tablet Take 25 mg by mouth daily.        . meclizine (ANTIVERT) 25 MG tablet Take 25 mg by mouth 2 (two) times daily.        . MULTIPLE VITAMIN PO Take 1 tablet by mouth daily.       . Omega-3 Fatty Acids (FISH OIL) 1000 MG CAPS Take by mouth 2 (two) times daily.        . potassium chloride SA (K-DUR,KLOR-CON) 20 MEQ tablet Take 20 mEq by mouth daily. Monday, Wednesday, friday      . rosuvastatin (CRESTOR) 5 MG tablet Take 5 mg by mouth every other day.        . sotalol (BETAPACE) 80 MG tablet Take 1 tablet (80 mg total) by mouth 2 (two) times daily.  30 tablet  0  . warfarin (COUMADIN) 5 MG tablet Take 1.25-2.5 mg by mouth daily. As directed by the coumadin clinic, take 1.25 mg on Tuesday and Saturday and 2.5 mg the remaining days  Allergies  Allergen Reactions  . Iohexol      Code: HIVES, Desc: hives w/ itching during cardiac cath '99, mult caths since w/ ? premeds, DR.G.HAYES REQUESTS 13 HR PRE MED//A.C.pt okay w/13 hr prep/mms, Onset Date: 16109604   . Prednisone Other (See Comments)    unknown    Review of Systems negative except from HPI and PMH  Physical Exam Well developed and well nourished in no acute distress HENT normal E scleral and icterus clear Neck Supple JVP 8-9  carotids brisk and full Bibasilar crackles irregularly irregular but correlating with VTNS and PVC Regular rate and rhythm, no murmurs gallops or rub Soft with active bowel sounds No clubbing cyanosis none Edema Alert and oriented, grossly normal motor and sensory function Skin Warm and Dry   Assessment and  Plan  VTNS  Pt is having significant increae in ventricular ectopy rendering his BiV not helpful and imparining perfusion  I would like to resume his mexilitene CHF-systolic  Will add lasix qod for 1 week  I suspect this is related to the above Also need to exclude pericardial issue given  perforation -get echo and anticpate discharge if neg ICM  Will add aldactone as out pt  dont want to confuse things

## 2011-05-18 NOTE — ED Notes (Signed)
Went in to give pt his medication, pt stated that he was told by cardiology that he was to be discharged home. Informed the pt that he was to receive lasix and have an Echo done. Pt stated that he did not want to stay for the testing. Talked with the PA about pt request. PA to speak with pt at this time.

## 2011-05-18 NOTE — ED Notes (Signed)
Delay explained to pt and wife.

## 2011-05-19 ENCOUNTER — Encounter: Payer: Self-pay | Admitting: Internal Medicine

## 2011-05-19 ENCOUNTER — Telehealth: Payer: Self-pay | Admitting: Internal Medicine

## 2011-05-19 NOTE — Telephone Encounter (Signed)
Per Dr. Graciela Husbands, he did not need to see the patient in the office this week. He restarted mexilitine in the ER due to ventricular ectopy. He needs to have his device checked in 10-14 days, then he will see him 2 weeks after that. The patient is agreeable he will see Dr. Graciela Husbands on 06/11/11 at 12:15pm.

## 2011-05-19 NOTE — Telephone Encounter (Signed)
Pt was seen in ED and was told by Dr. Graciela Husbands to call to see if his appt needed to be moved up and pt is coming in for a coumadin appt this week and was wondering if he can be seen then if needed

## 2011-05-19 NOTE — Telephone Encounter (Signed)
I spoke with the patient. He states he was in the ER for about 24 hours and was told by Dr. Graciela Husbands that he might need to follow up in a few days. He isn't really sure Dr. Graciela Husbands wants to see him. He will be here Friday at 3pm for a coumadin check. I advised I would review with Dr. Graciela Husbands and call him back. He is agreeable.

## 2011-05-20 NOTE — Telephone Encounter (Signed)
I spoke with Gunnar Fusi in the device clinic about the patient having an ICD check in about 10 days. Per Gunnar Fusi, I can just add the patient on their schedule. He is set up for Monday 05/31/11 at 4:00pm.

## 2011-05-21 ENCOUNTER — Other Ambulatory Visit: Payer: Self-pay | Admitting: Internal Medicine

## 2011-05-21 ENCOUNTER — Ambulatory Visit (INDEPENDENT_AMBULATORY_CARE_PROVIDER_SITE_OTHER): Payer: Medicare Other | Admitting: *Deleted

## 2011-05-21 DIAGNOSIS — I2589 Other forms of chronic ischemic heart disease: Secondary | ICD-10-CM

## 2011-05-21 DIAGNOSIS — I255 Ischemic cardiomyopathy: Secondary | ICD-10-CM

## 2011-05-21 DIAGNOSIS — Z7901 Long term (current) use of anticoagulants: Secondary | ICD-10-CM

## 2011-05-21 MED ORDER — SOTALOL HCL 80 MG PO TABS: 80.0000 mg | ORAL_TABLET | Freq: Two times a day (BID) | ORAL | Status: DC

## 2011-05-22 NOTE — ED Provider Notes (Signed)
Medical screening examination/treatment/procedure(s) were conducted as a shared visit with non-physician practitioner(s) and myself.  I personally evaluated the patient during the encounter.  70yM with near syncope and palpitations. Pt evaluated by cardiology in ED in consult and feel that pt can appropriately discharged from ED with outp fu.  Raeford Razor, MD 05/22/11 [phone]

## 2011-05-24 ENCOUNTER — Telehealth: Payer: Self-pay | Admitting: Internal Medicine

## 2011-05-24 ENCOUNTER — Encounter: Payer: Self-pay | Admitting: Internal Medicine

## 2011-05-24 NOTE — Telephone Encounter (Signed)
New message:  [name] has a drug therapy question about Sotalol.  Ref. Number [id number]

## 2011-05-24 NOTE — Telephone Encounter (Signed)
Pharmacist from [facility] calling to verify patient taking 2 beta blockers, sotalol and coreg.Chart reviewed and patient taking sotalol 80 mg twice a day and coreg 6.25 twice a day.Fowarded to Dr.Klein to make sure this is correct.

## 2011-05-31 ENCOUNTER — Ambulatory Visit (INDEPENDENT_AMBULATORY_CARE_PROVIDER_SITE_OTHER): Payer: Medicare Other | Admitting: *Deleted

## 2011-05-31 ENCOUNTER — Other Ambulatory Visit: Payer: Self-pay | Admitting: Internal Medicine

## 2011-05-31 DIAGNOSIS — I255 Ischemic cardiomyopathy: Secondary | ICD-10-CM

## 2011-05-31 DIAGNOSIS — I2589 Other forms of chronic ischemic heart disease: Secondary | ICD-10-CM

## 2011-05-31 DIAGNOSIS — I472 Ventricular tachycardia: Secondary | ICD-10-CM

## 2011-05-31 DIAGNOSIS — Z7901 Long term (current) use of anticoagulants: Secondary | ICD-10-CM

## 2011-05-31 LAB — POCT INR: INR: 3.3

## 2011-05-31 LAB — ICD DEVICE OBSERVATION
AL AMPLITUDE: 2.5 mv
DEV-0020ICD: NEGATIVE
DEVICE MODEL ICD: 828324
HV IMPEDENCE: 44 Ohm
LV LEAD IMPEDENCE ICD: 860 Ohm
RV LEAD AMPLITUDE: 11.8 mv
RV LEAD IMPEDENCE ICD: 330 Ohm
RV LEAD THRESHOLD: 1.25 V

## 2011-05-31 NOTE — Progress Notes (Signed)
icd interrogation only

## 2011-06-01 NOTE — Telephone Encounter (Signed)
Yes  tanks

## 2011-06-02 NOTE — Telephone Encounter (Signed)
I called and left [facility] a message in regards to Dr. Odessa Fleming response.

## 2011-06-11 ENCOUNTER — Ambulatory Visit (INDEPENDENT_AMBULATORY_CARE_PROVIDER_SITE_OTHER): Payer: Medicare Other | Admitting: Internal Medicine

## 2011-06-11 ENCOUNTER — Ambulatory Visit (INDEPENDENT_AMBULATORY_CARE_PROVIDER_SITE_OTHER): Payer: Medicare Other | Admitting: *Deleted

## 2011-06-11 ENCOUNTER — Encounter: Payer: Self-pay | Admitting: Internal Medicine

## 2011-06-11 DIAGNOSIS — I498 Other specified cardiac arrhythmias: Secondary | ICD-10-CM

## 2011-06-11 DIAGNOSIS — I255 Ischemic cardiomyopathy: Secondary | ICD-10-CM

## 2011-06-11 DIAGNOSIS — R001 Bradycardia, unspecified: Secondary | ICD-10-CM

## 2011-06-11 DIAGNOSIS — I2589 Other forms of chronic ischemic heart disease: Secondary | ICD-10-CM

## 2011-06-11 DIAGNOSIS — I5022 Chronic systolic (congestive) heart failure: Secondary | ICD-10-CM

## 2011-06-11 DIAGNOSIS — I509 Heart failure, unspecified: Secondary | ICD-10-CM

## 2011-06-11 DIAGNOSIS — Z95810 Presence of automatic (implantable) cardiac defibrillator: Secondary | ICD-10-CM

## 2011-06-11 DIAGNOSIS — I472 Ventricular tachycardia: Secondary | ICD-10-CM

## 2011-06-11 DIAGNOSIS — Z7901 Long term (current) use of anticoagulants: Secondary | ICD-10-CM

## 2011-06-11 LAB — ICD DEVICE OBSERVATION
AL IMPEDENCE ICD: 462.5 Ohm
AL THRESHOLD: 1.75 V
CHARGE TIME: 9.8 s
DEV-0020ICD: NEGATIVE
MODE SWITCH EPISODES: 0
PACEART VT: 0
RV LEAD THRESHOLD: 0.75 V
RV LEAD THRESHOLD: 1.125 V
TOT-0006: 20120229000000
TOT-0007: 0
TOT-0010: 10
TZON-0003SLOWVT: 570 ms
TZON-0004SLOWVT: 16
TZON-0010SLOWVT: 40 ms
VENTRICULAR PACING ICD: 88 pct

## 2011-06-11 MED ORDER — CARVEDILOL 6.25 MG PO TABS: ORAL_TABLET | ORAL | Status: DC

## 2011-06-11 MED ORDER — SPIRONOLACTONE 25 MG PO TABS: ORAL_TABLET | ORAL | Status: DC

## 2011-06-11 NOTE — Assessment & Plan Note (Signed)
No intercurrent ventricular tachycardia. We will continue him on sotalol and mexiletine

## 2011-06-11 NOTE — Assessment & Plan Note (Signed)
The patient's device was interrogated.  The information was reviewed. No changes were made in the programming.    

## 2011-06-11 NOTE — Assessment & Plan Note (Signed)
We'll continue him on his current medications and add Aldactone. We'll need to check his metabolic profile in 2 weeks time

## 2011-06-11 NOTE — Progress Notes (Signed)
HPI  [name] is a 71 y.o. male Is seen in followup for recurrent ventricular tachycardia. He has been on multiple medications in the past, and underwent catheter ablation in the setting of VT storm September 2012. He had post ablation ventricular tachycardia and was started on amiodarone which had previously not been used because of concurrent hypothyroidism. This is been treated intercurrently by Dr. Evlyn Kanner.he has been continued on mexiletine in conjunction with his amiodarone  Because of recurrent ventricular tachycardia he was transferred to [facility] for incessant VT and underwent an ablation complicated by pericardial perforation and subsequent pericarditis. He was seen a couple of weeks after the last visit in the emergency room with frequent ventricular ectopy almost in a pattern of accelerated idioventricular rhythm and we put him on mexiletine. This has been associated with a decrease in his ventricular burden from 38% to 6% and a marked improvement in his overall symptom complex.  His energy is improving.  He has had episodes where night he feels a little bit uncomfortable and his head is full. During these episodes his blood pressure has been elevated. He thinks the blood pressures the primary trigger.   His history of coronary artery disease with prior MI in 1999 treated percutaneously stenting to his RCA in 2005. Last cath in 2010 showing patent stents in the circumflex  and right coronary artery.  Ischemic cardiomyopathy with ejection fraction of 25-30% by echo in February 2012,     Past Medical History  Diagnosis Date  . CAD (coronary artery disease) 1999    Remote anterolater and apical MI, cath 2011 patent stents RCA/LCx  LAD w/o obstruction  . Cardiomyopathy, ischemic     EF 20-25%  echo 2012  . Chronic systolic congestive heart failure   . Ventricular tachycardia     recurrent slow VT at 100-110 bpm,  Rx mexilitene/sotalol  . ICD (implantable cardiac  defibrillator) in place     [place] Jude  . 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 Abdominal aortic aneurysm 2009    Repaired with endovascular stent  . PVD (peripheral vascular disease)     Has occluded innominate vein  . Left bundle branch block     Past Surgical History  Procedure Date  . Icd Feb 2012    Change out with LV lead placed with tunneling from the right  to the left side  . Endovascular stent insertion     for AAA  . Cardiac catheterization     x2  . Pacemaker insertion     Current Outpatient Prescriptions  Medication Sig Dispense Refill  . ALPRAZolam (XANAX) 0.25 MG tablet Take 0.25 mg by mouth 4 (four) times daily as needed. For anxiety       . Ascorbic Acid (VITAMIN C) 500 MG tablet Take 500 mg by mouth daily.        . carvedilol (COREG) 6.25 MG tablet Take 1 tablet (6.25 mg total) by mouth 2 (two) times daily.  270 tablet  0  . levothyroxine (SYNTHROID, LEVOTHROID) 88 MCG tablet Take 88 mcg by mouth daily.       . Loratadine (CLARITIN) 10 MG CAPS Take 1 capsule by mouth daily.       [name] losartan (COZAAR) 50 MG tablet Take 25 mg by mouth 2 (two) times daily.       . meclizine (ANTIVERT) 25 MG tablet Take 25 mg by mouth 2 (two) times daily.        [name]  mexiletine (MEXITIL) 150 MG capsule Take 150 mg by mouth 3 (three) times daily.       . MULTIPLE VITAMIN PO Take 1 tablet by mouth daily.       . Omega-3 Fatty Acids (FISH OIL) 1000 MG CAPS Take by mouth 2 (two) times daily.        . potassium chloride SA (K-DUR,KLOR-CON) 20 MEQ tablet Take 20 mEq by mouth daily. Monday, Wednesday, friday      . rosuvastatin (CRESTOR) 5 MG tablet Take 5 mg by mouth every other day.        . sotalol (BETAPACE) 80 MG tablet Take 1 tablet (80 mg total) by mouth 2 (two) times daily.  180 tablet  3  . warfarin (COUMADIN) 5 MG tablet Take 1.25-2.5 mg by mouth daily. As directed by the coumadin clinic, take 1.25 mg on Tuesday and Saturday and 2.5 mg the remaining days          Allergies    Allergen Reactions  . Iohexol      Code: HIVES, Desc: hives w/ itching during cardiac cath '99, mult caths since w/ ? premeds, DR.G.HAYES REQUESTS 13 HR PRE MED//A.C.pt okay w/13 hr prep/mms, Onset Date: 16109604   . Prednisone Other (See Comments)    unknown    Review of Systems negative except from HPI and PMH  Physical Exam Well developed and well nourished in no acute distress HENT normal E scleral and icterus clear Neck Supple JVP flat; carotids brisk and full Clear to ausculation Regular rate and rhythm, no murmurs gallops or rub Soft with active bowel sounds No clubbing cyanosis and edema Alert and oriented, grossly normal motor and sensory function Skin Warm and Dry   Assessment and  Plan

## 2011-06-11 NOTE — Patient Instructions (Signed)
Your physician has recommended you make the following change in your medication:  1) Restart aldactone (spironolactone) 25 mg 1/2 tablet by mouth once daily. 2) Increase coreg (carvedilol) to 6.25 mg one tablet by mouth every morning and two tablets by mouth every evening.  Your physician recommends that you return for lab work in: 2 weeks (bmp) when you have your INR checked.  Your physician wants you to follow-up in: 3 months. You will receive a reminder letter in the mail two months in advance. If you don't receive a letter, please call our office to schedule the follow-up appointment.

## 2011-06-11 NOTE — Assessment & Plan Note (Signed)
As noted above there've been some recent increase bikes. We'll plan to increase his nocturnal carvedilol dose from 6.25-12.5

## 2011-06-14 ENCOUNTER — Encounter: Payer: Self-pay | Admitting: Internal Medicine

## 2011-06-16 ENCOUNTER — Encounter: Payer: Self-pay | Admitting: Internal Medicine

## 2011-06-16 ENCOUNTER — Telehealth: Payer: Self-pay | Admitting: Internal Medicine

## 2011-06-16 DIAGNOSIS — I10 Essential (primary) hypertension: Secondary | ICD-10-CM

## 2011-06-16 DIAGNOSIS — R002 Palpitations: Secondary | ICD-10-CM

## 2011-06-16 NOTE — Telephone Encounter (Signed)
New problem Pt called he said his Bp has been 150/108, 154/97. He said this morning it was still 136/94. He wants to discuss with you.

## 2011-06-16 NOTE — Telephone Encounter (Signed)
I spoke with the patient. He states that he woke up around 2 am not feeling well. He was extremely hot and just not feeling good in general. His BP around 2 am was 155/94. He took this frequently over the next hour with readings from 140-155/86-100. He states that around 3 am he dropped to 124/76. His HR was maintained in the 70's. He states that his lowest BP reading today was 117/76. The last one he took just before I called showed him at 183/84. He is currently on losartan 25 mg BID, Coreg 6.25 mg in the am & 12.5 mg in the pm, spironolactone 12.5 mg in the evening, and his sotalol is BID. He is frustrated by the flucuations in his BP. He states that after an episode like last night that woke him up, that he doesn't even feel like leaving the house the next day. I explained I would review with Dr. Graciela Husbands to see what changes he recommends for the patient's meds, if any. He does have room to go up on some of what he is taking if needed. I will speak with Dr. Graciela Husbands about this tomorrow and call him back. He is agreeable.

## 2011-06-21 NOTE — Telephone Encounter (Signed)
i spoke with pt and we will try a monitor to see if PVC or AIVR is contrinbuting to his symptoms as suggested by his ER visit

## 2011-06-22 NOTE — Telephone Encounter (Signed)
Addended by: Sherri Rad C on: 06/22/2011 10:57 AM   Modules accepted: Orders

## 2011-06-22 NOTE — Telephone Encounter (Signed)
Will order an event recorder (KOH) per Dr. Graciela Husbands.

## 2011-06-23 ENCOUNTER — Encounter: Payer: Self-pay | Admitting: Internal Medicine

## 2011-06-23 ENCOUNTER — Telehealth: Payer: Self-pay | Admitting: Internal Medicine

## 2011-06-23 NOTE — Telephone Encounter (Signed)
I spoke with [name]. She will place the patient's monitor on Friday when he is here for coumadin. The patient is also aware.

## 2011-06-23 NOTE — Telephone Encounter (Signed)
I spoke with the patient. He has not heard anything from our office about the event monitor. I explained to him that I was unaware of this until yesterday when I was back in the office. He stated he had called the office on Monday to find out about this and spoke with Windell Moulding. He said she didn't know what kind of monitor was needed at the time. I apologized to the patient and explained that the order did not go in until yesterday when I was back. He will be coming to the office on Friday for a coumadin check. I explained to him that I would let Windell Moulding know this and see if we can coordinate his monitor for then. I will call him back. He is agreeable. Sherri Rad, RN, BSN   I have left a message for Windell Moulding to call me. Sherri Rad, RN, BSN

## 2011-06-23 NOTE — Telephone Encounter (Signed)
New Msg: Pt calling to speak with Heather. Please return pt call to discuss further.

## 2011-06-25 ENCOUNTER — Other Ambulatory Visit: Payer: Medicare Other | Admitting: *Deleted

## 2011-06-25 ENCOUNTER — Ambulatory Visit (INDEPENDENT_AMBULATORY_CARE_PROVIDER_SITE_OTHER): Payer: Medicare Other | Admitting: *Deleted

## 2011-06-25 ENCOUNTER — Encounter: Payer: Medicare Other | Admitting: *Deleted

## 2011-06-25 ENCOUNTER — Encounter (INDEPENDENT_AMBULATORY_CARE_PROVIDER_SITE_OTHER): Payer: Medicare Other

## 2011-06-25 DIAGNOSIS — Z7901 Long term (current) use of anticoagulants: Secondary | ICD-10-CM

## 2011-06-25 DIAGNOSIS — R002 Palpitations: Secondary | ICD-10-CM

## 2011-06-25 DIAGNOSIS — I2589 Other forms of chronic ischemic heart disease: Secondary | ICD-10-CM

## 2011-06-25 DIAGNOSIS — I10 Essential (primary) hypertension: Secondary | ICD-10-CM

## 2011-06-25 DIAGNOSIS — I255 Ischemic cardiomyopathy: Secondary | ICD-10-CM

## 2011-06-25 LAB — POCT INR: INR: 2.5

## 2011-07-16 ENCOUNTER — Ambulatory Visit (INDEPENDENT_AMBULATORY_CARE_PROVIDER_SITE_OTHER): Payer: Medicare Other | Admitting: Pharmacist

## 2011-07-16 ENCOUNTER — Telehealth: Payer: Self-pay | Admitting: *Deleted

## 2011-07-16 DIAGNOSIS — I255 Ischemic cardiomyopathy: Secondary | ICD-10-CM

## 2011-07-16 DIAGNOSIS — I2589 Other forms of chronic ischemic heart disease: Secondary | ICD-10-CM

## 2011-07-16 DIAGNOSIS — Z7901 Long term (current) use of anticoagulants: Secondary | ICD-10-CM

## 2011-07-16 NOTE — Telephone Encounter (Signed)
[name] came into the office to find out his results of his 7 day event monitor. He received the monitor on 06/25/11. Patient states he was curious about his monitor result . Patient also state that Dr. Graciela Husbands was sending a e-mail to a Electrophysiologist in [place] about his condition just in case he had any heart problems while in [place].

## 2011-07-20 NOTE — Telephone Encounter (Signed)
I spoke with the patient about his event results- Occasional PVC's. I explained to him that Dr. Graciela Husbands had not heard back from an EP in [place], but he is going to contact them again.

## 2011-07-21 ENCOUNTER — Encounter: Payer: Medicare Other | Admitting: Internal Medicine

## 2011-07-27 ENCOUNTER — Encounter: Payer: Self-pay | Admitting: Internal Medicine

## 2011-07-27 ENCOUNTER — Ambulatory Visit (INDEPENDENT_AMBULATORY_CARE_PROVIDER_SITE_OTHER): Payer: Medicare Other | Admitting: Internal Medicine

## 2011-07-27 ENCOUNTER — Telehealth: Payer: Self-pay | Admitting: Physician Assistant

## 2011-07-27 VITALS — BP 118/68 | HR 74 | Ht 68.0 in | Wt 175.5 lb

## 2011-07-27 DIAGNOSIS — I472 Ventricular tachycardia, unspecified: Secondary | ICD-10-CM

## 2011-07-27 DIAGNOSIS — Z95810 Presence of automatic (implantable) cardiac defibrillator: Secondary | ICD-10-CM

## 2011-07-27 LAB — MAGNESIUM: Magnesium: 2.1 mg/dL (ref 1.5–2.5)

## 2011-07-27 LAB — BASIC METABOLIC PANEL
BUN: 17 mg/dL (ref 6–23)
Creatinine, Ser: 1.3 mg/dL (ref 0.4–1.5)
GFR: 59.09 mL/min — ABNORMAL LOW (ref >60.00)
Glucose, Bld: 93 mg/dL (ref 70–99)

## 2011-07-27 NOTE — Telephone Encounter (Signed)
Called back patient's wife who reports he has had increased HR this morning to 130 bpm. BP stable ranging from 111-120/90s and having PVCs. He has a history of VT with slow rates at 100-110 bpm with an ablation done in 04/2011 and ICM s/p [employer] Jude ICD implantation 02/12. The patient's wife tells me that they keep a close eye on his HR and BP, and this is out of his normal range. He has presented this way in the past with associated presyncope and been advised to present to the ED. Patient is asymptomatic this morning with no ICD firings. Patient's wife tells me that he has minimally symptomatic while in VT in the past. He has taken all of his scheduled meds this morning. The patient's wife is anxious and would like patient to come into the office for further eval including device interrogation and EKG to bypass potential wait times in the ED. Will call office and notify of their arrival.   R. Hurman Horn, PA-C 07/27/2011 7:25 AM

## 2011-07-27 NOTE — Progress Notes (Signed)
HPI  [name] is a 71 y.o. male Is seen in followup for recurrent ventricular tachycardia. He has been on multiple medications in the past, and underwent catheter ablation in the setting of VT storm September 2012. He had post ablation ventricular tachycardia and was started on amiodarone which had previously not been used because of concurrent hypothyroidism. This is been treated intercurrently by Dr. Evlyn Kanner.he has been continued on mexiletine in conjunction with his amiodarone  Because of recurrent ventricular tachycardia he was transferred to [facility] for incessant VT and underwent an ablation complicated by pericardial perforation and subsequent pericarditis. He was seen a couple of weeks after the last visit in the emergency room with frequent ventricular ectopy almost in a pattern of accelerated idioventricular rhythm and we put him on mexiletine. This has been associated with a decrease in his ventricular burden from 38% to 6% and a marked improvement in his overall symptom complex.  He waking this morning with prolonged palpitations. He came in and his device was interrogated that demonstrated ventricular tachycardia in the 110 to 1:30 range that was quite prolonged. It made him feel weak. There is no associated chest pain or shortness of breath  His history of coronary artery disease with prior MI in 1999 treated percutaneously stenting to his RCA in 2005. Last cath in 2010 showing patent stents in the circumflex  and right coronary artery.  Ischemic cardiomyopathy with ejection fraction of 25-30% by echo in February 2012,     Past Medical History  Diagnosis Date  . CAD (coronary artery disease) 1999    Remote anterolater and apical MI, cath 2011 patent stents RCA/LCx  LAD w/o obstruction  . Cardiomyopathy, ischemic     EF 20-25%  echo 2012  . Chronic systolic congestive heart failure   . Ventricular tachycardia     recurrent slow VT at 100-110 bpm,  Rx mexilitene/sotalol  .  ICD (implantable cardiac defibrillator) in place     [place] Jude  . 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 Abdominal aortic aneurysm 2009    Repaired with endovascular stent  . PVD (peripheral vascular disease)     Has occluded innominate vein  . Left bundle branch block     Past Surgical History  Procedure Date  . Icd Feb 2012    Change out with LV lead placed with tunneling from the right  to the left side  . Endovascular stent insertion     for AAA  . Cardiac catheterization     x2  . Pacemaker insertion     Current Outpatient Prescriptions  Medication Sig Dispense Refill  . ALPRAZolam (XANAX) 0.25 MG tablet Take 0.25 mg by mouth 4 (four) times daily as needed. For anxiety       . Ascorbic Acid (VITAMIN C) 500 MG tablet Take 500 mg by mouth daily.        . carvedilol (COREG) 6.25 MG tablet Take one tablet by mouth every morning and two tablets by mouth every evening  270 tablet  3  . levothyroxine (SYNTHROID, LEVOTHROID) 88 MCG tablet Take 88 mcg by mouth daily. Take 1 tablet daily and take 1.5 tablets on Thursdays      . Loratadine (CLARITIN) 10 MG CAPS Take 1 capsule by mouth daily.       [name] losartan (COZAAR) 50 MG tablet Take 25 mg by mouth 2 (two) times daily.       . meclizine (ANTIVERT) 25 MG tablet Take  25 mg by mouth 2 (two) times daily.        [name] mexiletine (MEXITIL) 150 MG capsule Take 150 mg by mouth 3 (three) times daily.       . MULTIPLE VITAMIN PO Take 1 tablet by mouth daily.       . Omega-3 Fatty Acids (FISH OIL) 1000 MG CAPS Take by mouth 2 (two) times daily.        . potassium chloride SA (K-DUR,KLOR-CON) 20 MEQ tablet Take 20 mEq by mouth daily. Monday, Wednesday, friday      . rosuvastatin (CRESTOR) 5 MG tablet Take 5 mg by mouth every other day.        . sotalol (BETAPACE) 80 MG tablet Take 1 tablet (80 mg total) by mouth 2 (two) times daily.  180 tablet  3  . spironolactone (ALDACTONE) 25 MG tablet Take 1/2 tablet by mouth once daily  90 tablet  2  . warfarin  (COUMADIN) 5 MG tablet Take 1.25-2.5 mg by mouth daily. As directed by the coumadin clinic, take 1.25 mg on Tuesday and Saturday and 2.5 mg the remaining days          Allergies  Allergen Reactions  . Iohexol      Code: HIVES, Desc: hives w/ itching during cardiac cath '99, mult caths since w/ ? premeds, DR.G.HAYES REQUESTS 13 HR PRE MED//A.C.pt okay w/13 hr prep/mms, Onset Date: 16109604   . Prednisone Other (See Comments)    unknown    Review of Systems negative except from HPI and PMH  Physical Exam Well developed and well nourished in no acute distress HENT normal E scleral and icterus clear Neck Supple JVP flat; carotids brisk and full Clear to ausculation Regular rate and rhythm, no murmurs gallops or rub Soft with active bowel sounds No clubbing cyanosis and edema Alert and oriented, grossly normal motor and sensory function Skin Warm and Dry   Assessment and  Plan

## 2011-07-27 NOTE — Patient Instructions (Signed)
Your physician recommends that you return for lab work today BMP/MAg  Your physician recommends that you schedule a follow-up appointment with Dr Johney Frame on 09/06/11

## 2011-07-27 NOTE — Assessment & Plan Note (Addendum)
He had recurrent slow ventricular tachycardia. We discussed a variety of medication options including #1 increasing sotalol, #2 increasing mexiletine, # 3 t amiodarone in exchange for one of the above. We spent a long time discussing the physiology of these medications and elected ultimately to increase his sotalol from 80-120 mg twice daily;  we will check his potassium and magnesium levels today  In addition we have asked him to go back and see Dr. Hillis Range for consideration of a repeat ablation procedure. He is quite eager to do this.

## 2011-07-27 NOTE — Assessment & Plan Note (Signed)
The patient's device was interrogated.  The information was reviewed. No changes were made in the programming.    

## 2011-07-30 ENCOUNTER — Ambulatory Visit (INDEPENDENT_AMBULATORY_CARE_PROVIDER_SITE_OTHER): Payer: Medicare Other | Admitting: Internal Medicine

## 2011-07-30 ENCOUNTER — Ambulatory Visit: Payer: Medicare Other | Admitting: Internal Medicine

## 2011-07-30 ENCOUNTER — Encounter: Payer: Self-pay | Admitting: Internal Medicine

## 2011-07-30 ENCOUNTER — Telehealth: Payer: Self-pay | Admitting: Internal Medicine

## 2011-07-30 VITALS — BP 130/60 | HR 74 | Wt 175.5 lb

## 2011-07-30 DIAGNOSIS — I472 Ventricular tachycardia: Secondary | ICD-10-CM

## 2011-07-30 NOTE — Telephone Encounter (Signed)
I spoke with the patient today. [name] states that Dr. Graciela Husbands increased his sotalol to 120 mg BID on 07/27/11. [name] reports that on 2/27 [name] woke up with a rapid HR from 6 am- 6:20 am with rates from 102-133 bpm. On 2/28 [name] woke up at 2 am with a HR of 133, then from 6:30am- 6:45am [name] was 112-132 bpm. The rest of yesterday [name] felt ok. At 2 am this morning [name] felt ok when [name] got up, but at 5:45 am [name] did not feel well. [name] checked his vital signs about every 5 minutes with BP readings from 103-111/78-86 and his HR was 117-129 bpm. [name] states [name] is not feeling well and that Dr. Graciela Husbands had mentioned possibly restarting amiodarone for him. [name] is being referred to Dr. Johney Frame for consideration of ablation. I explained that in Dr. Odessa Fleming abscence I will review with Dr. Johney Frame and call him back. [name] is agreeable.

## 2011-07-30 NOTE — Telephone Encounter (Signed)
Pt calling re arrythmia problem, pls call

## 2011-07-30 NOTE — Telephone Encounter (Signed)
I reviewed the patient's complaints with Dr. Johney Frame. He would like him to come today for an EKG to be done so we can reassess his QT interval. We may then go up on his sotalol to 160 mg BID. The patient is aware and will have his wife bring him.

## 2011-07-30 NOTE — Patient Instructions (Signed)
Will be admitted on 08/23/2011 for VT Ablation on 08/24/11.  Increase Sotalol to 160mg  twice daily.  Return Monday 08/02/11 for EKG.  When here ask to see [name] or [name].

## 2011-08-01 ENCOUNTER — Encounter: Payer: Self-pay | Admitting: Internal Medicine

## 2011-08-01 NOTE — Assessment & Plan Note (Signed)
The patient has ongoing difficulty with ischemic ventricular tachycardia.  ICD interrogation today reveals very frequent nonsustained VT.  The longest sustained episode was 1 minute and 3 seconds at 117 bpm. Therapeutic strategies for VT including medicine and ablation were discussed in detail with the patient today. Risk, benefits, and alternatives to EP study and radiofrequency ablation for afib were also discussed in detail today. The patient is clear that [name] would like to pursue repeat catheter ablation.  We will therefore schedule ablation at the next available time.  In there interim, I will increase sotalol to 160mg  BID.  [name] will return early next week for repeat EKG to evaluate QT interval. [name] will contact my office if his VT does not improve. I will plan to hold sotalol 48 hours prior to ablation.  I think that given his difficulty with sustained VT, it would be most prudent to admit him for observation 24 hours after sotalol is discontinued to avoid VT storm at home. [name] has a large left ventricular scar making ablation therapy previously quite difficult.  We will ask for assistance of anesthesia with his procedure.  No changes were made to his ICD today.

## 2011-08-01 NOTE — Progress Notes (Signed)
PCP:  SOUTH,STEPHEN ALAN, MD, MD Primary EP:  Dr Klein   [name] is a 70 y.o. Male with recurrent refractory VT who presents today as an add on patient due to ongoing symptoms of VT.  He has been on multiple medications in the past, and underwent catheter ablation in the setting of VT storm September 2012. He had post ablation ventricular tachycardia and was started on amiodarone which had previously not been used because of concurrent hypothyroidism. Because of recurrent ventricular tachycardia he was transferred to [facility] for incessant VT and underwent an ablation complicated by pericardial perforation and subsequent pericarditis.  He developed recurrent symptoms of VT and was placed on sotalol and mexiletine. He was recently seen by Dr Klein and found to have further VT.  Sotalol was increased to 120mg BID.  Since that time however, he continues to have frequent tachypalpitations.  He therefore called our office today requesting to be seen.   His history of coronary artery disease with prior MI in 1999 treated percutaneously stenting to his RCA in 2005. Last cath in 2010 showing patent stents in the circumflex  and right coronary artery.  Ischemic cardiomyopathy with ejection fraction of 25-30% by echo in February 2012,    Past Medical History  Diagnosis Date  . CAD (coronary artery disease) 1999    Remote anterolater and apical MI, cath 2011 patent stents RCA/LCx  LAD w/o obstruction  . Cardiomyopathy, ischemic     EF 20-25%  echo 2012  . Chronic systolic congestive heart failure   . Ventricular tachycardia     recurrent slow VT at 100-110 bpm,  Rx mexilitene/sotalol  . ICD (implantable cardiac defibrillator) in place     [employer]  . 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 Abdominal aortic aneurysm 2009    Repaired with endovascular stent  . PVD (peripheral vascular disease)     Has occluded innominate vein  . Left bundle branch block    Past Surgical History  Procedure  Date  . Icd Feb 2012    Change out with LV lead placed with tunneling from the right  to the left side  . Endovascular stent insertion     for AAA  . Cardiac catheterization     x2  . Pacemaker insertion   . Ventricular ablation surgery     s/p prior EPS and ablation at [facility] and also at [facility]    Current Outpatient Prescriptions  Medication Sig Dispense Refill  . ALPRAZolam (XANAX) 0.25 MG tablet Take 0.25 mg by mouth 4 (four) times daily as needed. For anxiety       . Ascorbic Acid (VITAMIN C) 500 MG tablet Take 500 mg by mouth daily.        . carvedilol (COREG) 6.25 MG tablet Take one tablet by mouth every morning and two tablets by mouth every evening  270 tablet  3  . levothyroxine (SYNTHROID, LEVOTHROID) 88 MCG tablet Take 88 mcg by mouth daily. Take 1 tablet daily and take 1.5 tablets on Thursdays      . Loratadine (CLARITIN) 10 MG CAPS Take 1 capsule by mouth daily.       . losartan (COZAAR) 50 MG tablet Take 25 mg by mouth 2 (two) times daily.       . meclizine (ANTIVERT) 25 MG tablet Take 25 mg by mouth 2 (two) times daily.        . mexiletine (MEXITIL) 150 MG capsule Take 150   mg by mouth 3 (three) times daily.       . MULTIPLE VITAMIN PO Take 1 tablet by mouth daily.       . Omega-3 Fatty Acids (FISH OIL) 1000 MG CAPS Take by mouth 2 (two) times daily.        . potassium chloride SA (K-DUR,KLOR-CON) 20 MEQ tablet Take 20 mEq by mouth daily. Monday, Wednesday, friday      . rosuvastatin (CRESTOR) 5 MG tablet Take 5 mg by mouth every other day.        . sotalol (BETAPACE) 80 MG tablet Take 1 1/2 tablets by mouth twice daily      . spironolactone (ALDACTONE) 25 MG tablet Take 1/2 tablet by mouth once daily  90 tablet  2  . warfarin (COUMADIN) 5 MG tablet Take 1.25-2.5 mg by mouth daily. As directed by the coumadin clinic, take 1.25 mg on Tuesday and Saturday and 2.5 mg the remaining days          Allergies  Allergen Reactions  . Iohexol      Code: HIVES, Desc: hives w/  itching during cardiac cath '99, mult caths since w/ ? premeds, DR.G.HAYES REQUESTS 13 HR PRE MED//A.C.pt okay w/13 hr prep/mms, Onset Date: 05211999   . Prednisone Other (See Comments)    unknown    History   Social History  . Marital Status: Married    Spouse Name: N/A    Number of Children: N/A  . Years of Education: N/A   Occupational History  . retired      used to sell insurance   Social History Main Topics  . Smoking status: Former Smoker  . Smokeless tobacco: Former User    Quit date: 06/01/1987  . Alcohol Use: Yes     very rare  . Drug Use: No  . Sexually Active: Not on file   Other Topics Concern  . Not on file   Social History Narrative  . No narrative on file    Family History  Problem Relation Age of Onset  . Stroke Mother   . Heart attack Father   . Heart attack Mother     ROS-  All systems are reviewed and are negative except as outlined in the HPI above   Physical Exam: Filed Vitals:   07/30/11 1344  BP: 130/60  Pulse: 74  Weight: 175 lb 8 oz (79.606 kg)    GEN- The patient is anxious appearing, alert and oriented x 3 today.   Head- normocephalic, atraumatic Eyes-  Sclera clear, conjunctiva pink Ears- hearing intact Oropharynx- clear Neck- supple, no JVP Lymph- no cervical lymphadenopathy Lungs- Clear to ausculation bilaterally, normal work of breathing Chest- ICD pocket is well healed Heart- Regular rate and rhythm with frequent ectopy GI- soft, NT, ND, + BS Extremities- no clubbing, cyanosis, or edema  ICD interrogation- reviewed in detail today,  See PACEART report  ekg today reveals AV pacing with frequent PVCs (RBB superior axis) and Qt 430msec  Assessment and Plan:  

## 2011-08-02 ENCOUNTER — Other Ambulatory Visit: Payer: Self-pay | Admitting: *Deleted

## 2011-08-02 ENCOUNTER — Ambulatory Visit (INDEPENDENT_AMBULATORY_CARE_PROVIDER_SITE_OTHER): Payer: Medicare Other | Admitting: *Deleted

## 2011-08-02 ENCOUNTER — Encounter: Payer: Self-pay | Admitting: *Deleted

## 2011-08-02 DIAGNOSIS — I472 Ventricular tachycardia, unspecified: Secondary | ICD-10-CM

## 2011-08-02 LAB — BASIC METABOLIC PANEL
BUN: 17 mg/dL (ref 6–23)
Calcium: 9.2 mg/dL (ref 8.4–10.5)
Chloride: 104 mEq/L (ref 96–112)
Creatinine, Ser: 1.3 mg/dL (ref 0.4–1.5)
GFR: 57.02 mL/min — ABNORMAL LOW (ref >60.00)

## 2011-08-02 LAB — MAGNESIUM: Magnesium: 2 mg/dL (ref 1.5–2.5)

## 2011-08-10 NOTE — Progress Notes (Signed)
Addended by: Judithe Modest D on: 08/10/2011 03:48 PM   Modules accepted: Orders

## 2011-08-16 ENCOUNTER — Encounter: Payer: Self-pay | Admitting: Internal Medicine

## 2011-08-16 ENCOUNTER — Ambulatory Visit (INDEPENDENT_AMBULATORY_CARE_PROVIDER_SITE_OTHER): Payer: Medicare Other

## 2011-08-16 DIAGNOSIS — I255 Ischemic cardiomyopathy: Secondary | ICD-10-CM

## 2011-08-16 DIAGNOSIS — I2589 Other forms of chronic ischemic heart disease: Secondary | ICD-10-CM

## 2011-08-16 DIAGNOSIS — Z7901 Long term (current) use of anticoagulants: Secondary | ICD-10-CM

## 2011-08-16 LAB — POCT INR: INR: 2.7

## 2011-08-17 ENCOUNTER — Encounter (HOSPITAL_COMMUNITY): Payer: Self-pay | Admitting: Pharmacy Technician

## 2011-08-17 ENCOUNTER — Other Ambulatory Visit: Payer: Self-pay | Admitting: *Deleted

## 2011-08-17 DIAGNOSIS — I472 Ventricular tachycardia: Secondary | ICD-10-CM

## 2011-08-22 ENCOUNTER — Telehealth: Payer: Self-pay | Admitting: Physician Assistant

## 2011-08-22 NOTE — Telephone Encounter (Signed)
Patient to come in tomorrow for VT ablation on 3/26. He is to hold meds tonight.  Patient concerned about not getting a bed until 12 pm or later tomorrow. I spoke with Dr. Hillis Range.  It is ok for him to be unmonitored until that time. I called patient back and reassured him. Bed control had already put in for a bed tonight for patient due to their concerns. I called bed control back and changed bed request until tomorrow. Tereso Newcomer, PA-C  3:21 PM 08/22/2011

## 2011-08-23 ENCOUNTER — Other Ambulatory Visit: Payer: Self-pay

## 2011-08-23 ENCOUNTER — Inpatient Hospital Stay (HOSPITAL_COMMUNITY)
Admission: AD | Admit: 2011-08-23 | Discharge: 2011-08-25 | DRG: 251 | Disposition: A | Discharge status: Home / Self Care | Payer: Medicare Other | Source: Ambulatory Visit | Attending: Internal Medicine | Admitting: Internal Medicine

## 2011-08-23 ENCOUNTER — Encounter (HOSPITAL_COMMUNITY): Payer: Self-pay | Admitting: General Practice

## 2011-08-23 DIAGNOSIS — I4729 Other ventricular tachycardia: Principal | ICD-10-CM | POA: Diagnosis present

## 2011-08-23 DIAGNOSIS — F419 Anxiety disorder, unspecified: Secondary | ICD-10-CM

## 2011-08-23 DIAGNOSIS — I472 Ventricular tachycardia, unspecified: Principal | ICD-10-CM | POA: Diagnosis present

## 2011-08-23 DIAGNOSIS — Z87891 Personal history of nicotine dependence: Secondary | ICD-10-CM

## 2011-08-23 DIAGNOSIS — I2589 Other forms of chronic ischemic heart disease: Secondary | ICD-10-CM | POA: Diagnosis present

## 2011-08-23 DIAGNOSIS — I498 Other specified cardiac arrhythmias: Secondary | ICD-10-CM | POA: Diagnosis present

## 2011-08-23 DIAGNOSIS — R001 Bradycardia, unspecified: Secondary | ICD-10-CM | POA: Insufficient documentation

## 2011-08-23 DIAGNOSIS — F411 Generalized anxiety disorder: Secondary | ICD-10-CM | POA: Diagnosis present

## 2011-08-23 DIAGNOSIS — I255 Ischemic cardiomyopathy: Secondary | ICD-10-CM | POA: Diagnosis present

## 2011-08-23 DIAGNOSIS — I2510 Atherosclerotic heart disease of native coronary artery without angina pectoris: Secondary | ICD-10-CM

## 2011-08-23 DIAGNOSIS — I739 Peripheral vascular disease, unspecified: Secondary | ICD-10-CM | POA: Diagnosis present

## 2011-08-23 DIAGNOSIS — Z95810 Presence of automatic (implantable) cardiac defibrillator: Secondary | ICD-10-CM | POA: Diagnosis present

## 2011-08-23 DIAGNOSIS — I509 Heart failure, unspecified: Secondary | ICD-10-CM | POA: Diagnosis present

## 2011-08-23 DIAGNOSIS — I5022 Chronic systolic (congestive) heart failure: Secondary | ICD-10-CM | POA: Insufficient documentation

## 2011-08-23 DIAGNOSIS — Z7901 Long term (current) use of anticoagulants: Secondary | ICD-10-CM

## 2011-08-23 DIAGNOSIS — I10 Essential (primary) hypertension: Secondary | ICD-10-CM | POA: Insufficient documentation

## 2011-08-23 HISTORY — DX: Unspecified osteoarthritis, unspecified site: M19.90

## 2011-08-23 HISTORY — DX: Essential (primary) hypertension: I10

## 2011-08-23 LAB — COMPREHENSIVE METABOLIC PANEL
ALT: 16 U/L (ref 0–53)
AST: 19 U/L (ref 0–37)
CO2: 29 mEq/L (ref 19–32)
Calcium: 9.6 mg/dL (ref 8.4–10.5)
Creatinine, Ser: 1.13 mg/dL (ref 0.50–1.35)
GFR calc non Af Amer: 64 mL/min — ABNORMAL LOW (ref >90)
Sodium: 139 mEq/L (ref 135–145)
Total Protein: 7.3 g/dL (ref 6.0–8.3)

## 2011-08-23 LAB — DIFFERENTIAL
Basophils Absolute: 0.1 10*3/uL (ref 0.0–0.1)
Basophils Relative: 1 % (ref 0–1)
Eosinophils Absolute: 0.1 10*3/uL (ref 0.0–0.7)
Eosinophils Relative: 2 % (ref 0–5)
Monocytes Absolute: 1 10*3/uL (ref 0.1–1.0)

## 2011-08-23 LAB — CBC
HCT: 43.6 % (ref 39.0–52.0)
Hemoglobin: 14.3 g/dL (ref 13.0–17.0)
RBC: 4.71 MIL/uL (ref 4.22–5.81)
RDW: 14.4 % (ref 11.5–15.5)
WBC: 8.2 10*3/uL (ref 4.0–10.5)

## 2011-08-23 LAB — PROTIME-INR: INR: 2.01 — ABNORMAL HIGH (ref 0.00–1.49)

## 2011-08-23 MED ORDER — WARFARIN 1.25 MG HALF TABLET
1.2500 mg | ORAL_TABLET | ORAL | Status: DC
  Filled 2011-08-23: qty 1

## 2011-08-23 MED ORDER — CARVEDILOL 6.25 MG PO TABS
6.2500 mg | ORAL_TABLET | Freq: Two times a day (BID) | ORAL | Status: DC
  Filled 2011-08-23 (×2): qty 2

## 2011-08-23 MED ORDER — LOSARTAN POTASSIUM 25 MG PO TABS
25.0000 mg | ORAL_TABLET | Freq: Two times a day (BID) | ORAL | Status: DC
  Administered 2011-08-23 – 2011-08-25 (×3): 25 mg via ORAL
  Filled 2011-08-23 (×6): qty 1

## 2011-08-23 MED ORDER — LEVOTHYROXINE SODIUM 88 MCG PO TABS
88.0000 ug | ORAL_TABLET | Freq: Every day | ORAL | Status: DC
  Administered 2011-08-25: 88 ug via ORAL
  Filled 2011-08-23 (×4): qty 1

## 2011-08-23 MED ORDER — WARFARIN 1.25 MG HALF TABLET: 1.2500 mg | ORAL_TABLET | Freq: Every day | ORAL | Status: DC

## 2011-08-23 MED ORDER — SODIUM CHLORIDE 0.9 % IJ SOLN: 3.0000 mL | INTRAMUSCULAR | Status: DC | PRN

## 2011-08-23 MED ORDER — SODIUM CHLORIDE 0.9 % IJ SOLN
3.0000 mL | Freq: Two times a day (BID) | INTRAMUSCULAR | Status: DC
  Administered 2011-08-23 – 2011-08-24 (×2): 3 mL via INTRAVENOUS

## 2011-08-23 MED ORDER — ALPRAZOLAM 0.25 MG PO TABS
0.2500 mg | ORAL_TABLET | Freq: Four times a day (QID) | ORAL | Status: DC | PRN
  Administered 2011-08-23: 0.25 mg via ORAL
  Filled 2011-08-23: qty 1

## 2011-08-23 MED ORDER — SODIUM CHLORIDE 0.9 % IV SOLN: 250.0000 mL | INTRAVENOUS | Status: DC | PRN

## 2011-08-23 MED ORDER — WARFARIN SODIUM 2.5 MG PO TABS
2.5000 mg | ORAL_TABLET | ORAL | Status: DC
  Administered 2011-08-23: 2.5 mg via ORAL
  Filled 2011-08-23 (×3): qty 1

## 2011-08-23 MED ORDER — LORATADINE 10 MG PO CAPS: 1.0000 | ORAL_CAPSULE | Freq: Every day | ORAL | Status: DC

## 2011-08-23 MED ORDER — SPIRONOLACTONE 12.5 MG HALF TABLET
12.5000 mg | ORAL_TABLET | Freq: Every day | ORAL | Status: DC
  Administered 2011-08-23 – 2011-08-25 (×2): 12.5 mg via ORAL
  Filled 2011-08-23 (×3): qty 1

## 2011-08-23 MED ORDER — WARFARIN - PHYSICIAN DOSING INPATIENT: Freq: Every day | Status: DC

## 2011-08-23 MED ORDER — CARVEDILOL 12.5 MG PO TABS
12.5000 mg | ORAL_TABLET | Freq: Every day | ORAL | Status: DC
  Administered 2011-08-23: 12.5 mg via ORAL
  Filled 2011-08-23: qty 1

## 2011-08-23 MED ORDER — ZOLPIDEM TARTRATE 5 MG PO TABS: 5.0000 mg | ORAL_TABLET | Freq: Every evening | ORAL | Status: DC | PRN

## 2011-08-23 MED ORDER — CARVEDILOL 6.25 MG PO TABS
6.2500 mg | ORAL_TABLET | Freq: Every day | ORAL | Status: DC
  Filled 2011-08-23: qty 1

## 2011-08-23 MED ORDER — MECLIZINE HCL 25 MG PO TABS
25.0000 mg | ORAL_TABLET | Freq: Three times a day (TID) | ORAL | Status: DC
  Administered 2011-08-23 – 2011-08-25 (×3): 25 mg via ORAL
  Filled 2011-08-23 (×9): qty 1

## 2011-08-23 MED ORDER — LORATADINE 10 MG PO TABS
10.0000 mg | ORAL_TABLET | Freq: Every day | ORAL | Status: DC
Start: 2011-08-23 — End: 2011-08-25
  Administered 2011-08-23 – 2011-08-25 (×2): 10 mg via ORAL
  Filled 2011-08-23 (×3): qty 1

## 2011-08-23 MED ORDER — OMEGA-3-ACID ETHYL ESTERS 1 G PO CAPS
1.0000 g | ORAL_CAPSULE | Freq: Two times a day (BID) | ORAL | Status: DC
  Administered 2011-08-23 – 2011-08-25 (×3): 1 g via ORAL
  Filled 2011-08-23 (×6): qty 1

## 2011-08-23 MED ORDER — ADULT MULTIVITAMIN W/MINERALS CH
1.0000 | ORAL_TABLET | Freq: Every day | ORAL | Status: DC
  Administered 2011-08-23 – 2011-08-25 (×2): 1 via ORAL
  Filled 2011-08-23 (×3): qty 1

## 2011-08-23 MED ORDER — ONDANSETRON HCL 4 MG/2ML IJ SOLN: 4.0000 mg | Freq: Four times a day (QID) | INTRAMUSCULAR | Status: DC | PRN

## 2011-08-23 MED ORDER — ATORVASTATIN CALCIUM 10 MG PO TABS
10.0000 mg | ORAL_TABLET | Freq: Every day | ORAL | Status: DC
  Filled 2011-08-23 (×3): qty 1

## 2011-08-23 MED ORDER — ACETAMINOPHEN 325 MG PO TABS
650.0000 mg | ORAL_TABLET | ORAL | Status: DC | PRN
  Administered 2011-08-24 – 2011-08-25 (×2): 650 mg via ORAL
  Filled 2011-08-23 (×2): qty 2

## 2011-08-23 MED ORDER — POTASSIUM CHLORIDE CRYS ER 20 MEQ PO TBCR
20.0000 meq | EXTENDED_RELEASE_TABLET | ORAL | Status: DC
  Administered 2011-08-25: 20 meq via ORAL
  Filled 2011-08-23 (×2): qty 1

## 2011-08-23 MED ORDER — NITROGLYCERIN 0.4 MG SL SUBL: 0.4000 mg | SUBLINGUAL_TABLET | SUBLINGUAL | Status: DC | PRN

## 2011-08-23 NOTE — H&P (Signed)
Patient ID: [name] MRN: [id number], DOB/AGE: 08/23/1940   Admit date: 08/23/2011  Primary Physician: Julian Hy, MD, MD Primary Cardiologist: J. Allred, MD  Pt. Profile:  71 y/o male w/ h/o CAD/ICM/VT storm who presents for VT ablation tomorrow.  Problem List  Past Medical History  Diagnosis Date  . CAD (coronary artery disease) 1999    Remote anterolater and apical MI, cath 2011 patent stents RCA/LCx  LAD w/o obstruction  . Cardiomyopathy, ischemic     EF 20-25%  echo 2012  . Chronic systolic congestive heart failure   . ICD (implantable cardiac defibrillator) in place     [place] Jude  . 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 Abdominal aortic aneurysm 2009    Repaired with endovascular stent  . PVD (peripheral vascular disease)     Has occluded innominate vein  . Myocardial infarction   . Hypertension   . Ventricular tachycardia     recurrent slow VT at 100-110 bpm,  Rx mexilitene/sotalol  . Left bundle branch block   . Systolic CHF, chronic   . Arthritis     Past Surgical History  Procedure Date  . Icd Feb 2012    Change out with LV lead placed with tunneling from the right  to the left side  . Endovascular stent insertion     for AAA  . Cardiac catheterization     x2  . Pacemaker insertion   . Ventricular ablation surgery     s/p prior EPS and ablation at [facility] and also at [facility]  . Tonsillectomy      Allergies  Allergies  Allergen Reactions  . Iohexol      Code: HIVES, Desc: hives w/ itching during cardiac cath '99, mult caths since w/ ? premeds, DR.G.HAYES REQUESTS 13 HR PRE MED//A.C.pt okay w/13 hr prep/mms, Onset Date: [id number]   . Prednisone Other (See Comments)    unknown    HPI  71 y/o male with the above complex history.  He is s/p catheter ablation of VT in 01/2011 with repeat procedure later in the fall @ [facility] (complicated by pericardial perforation and pericarditis).  Unfortunately, he has cont to have freq,  symptomatic, tachypalps requiring titration of his Sotalol dose (also on Mexiletine).  He saw Dr. Johney Frame earlier this month and interrogation of his ICD showed frequent NSVT, the longest of which lasted 1:03 (117 bpm).  After discussion w/ pt, decision was made to pursue repeat RFCA.  He stopped his sotalol & mexiletine yesterday (took AM dose yesterday) and presents today for elective admission in an effort to monitor him off of antiarrhythmic in case he has recurrent VT storm.  Since his last visit with Dr. Johney Frame, he has had multiple, prolonged periods of intermittent VT but this has been quiescent over the past week.  Home Medications  Prior to Admission medications   Medication Sig Start Date End Date Taking? Authorizing Provider  ALPRAZolam (XANAX) 0.25 MG tablet Take 0.25 mg by mouth 4 (four) times daily as needed. For anxiety  01/08/11  Yes Cassell Clement, MD  Ascorbic Acid (VITAMIN C) 500 MG tablet Take 500 mg by mouth daily.     Yes Historical Provider, MD  carvedilol (COREG) 6.25 MG tablet Take 6.25-12.5 mg by mouth 2 (two) times daily with a meal. Take 1 tablet in the morning and 2 tablets in the evening   Yes Historical Provider, MD  levothyroxine (SYNTHROID, LEVOTHROID) 88 MCG tablet Take 88  mcg by mouth daily.    Yes Historical Provider, MD  Loratadine (CLARITIN) 10 MG CAPS Take 1 capsule by mouth daily.    Yes Historical Provider, MD  losartan (COZAAR) 50 MG tablet Take 25 mg by mouth 2 (two) times daily.  02/24/11  Yes [name], MD  meclizine (ANTIVERT) 25 MG tablet Take 25 mg by mouth 3 (three) times daily.    Yes Historical Provider, MD  mexiletine (MEXITIL) 150 MG capsule Take 150 mg by mouth 3 (three) times daily.  05/18/11  Yes Historical Provider, MD  Multiple Vitamin (MULITIVITAMIN WITH MINERALS) TABS Take 1 tablet by mouth daily.   Yes Historical Provider, MD  Omega-3 Fatty Acids (FISH OIL) 1000 MG CAPS Take 1,000 mg by mouth 2 (two) times daily.    Yes Historical  Provider, MD  potassium chloride SA (K-DUR,KLOR-CON) 20 MEQ tablet Take 20 mEq by mouth 3 (three) times a week. Monday, Wednesday, friday 02/24/11  Yes [name], MD  rosuvastatin (CRESTOR) 5 MG tablet Take 5 mg by mouth every other day.     Yes Historical Provider, MD  sotalol (BETAPACE) 80 MG tablet Take 120 mg by mouth 2 (two) times daily.  05/21/11 05/20/12 Yes [name], MD  spironolactone (ALDACTONE) 25 MG tablet Take 12.5 mg by mouth daily.   Yes Historical Provider, MD  warfarin (COUMADIN) 5 MG tablet Take 1.25-2.5 mg by mouth daily. Take 1.25mg  on Tuesday, Thursday, and Saturday.  Take 2.5mg  on Sunday, Monday, Wednesday, Friday.  05/06/11  Yes Cassell Clement, MD    Family History  Family History  Problem Relation Age of Onset  . Stroke Mother   . Heart attack Father   . Heart attack Mother     Social History  History   Social History  . Marital Status: Married    Spouse Name: N/A    Number of Children: N/A  . Years of Education: N/A   Occupational History  . retired      used to Midwife   Social History Main Topics  . Smoking status: Former Games developer  . Smokeless tobacco: Former Neurosurgeon    Quit date: 06/01/1987  . Alcohol Use: Yes     very rare  . Drug Use: No  . Sexually Active: Not Currently   Other Topics Concern  . Not on file   Social History Narrative  . No narrative on file     Review of Systems General:  No chills, fever, night sweats or weight changes.  Cardiovascular:  +++palpitations in setting of VT.  No chest pain, dyspnea on exertion, edema, orthopnea, paroxysmal nocturnal dyspnea. Dermatological: No rash, lesions/masses Respiratory: No cough, dyspnea Urologic: No hematuria, dysuria Abdominal:   No nausea, vomiting, diarrhea, bright red blood per rectum, melena, or hematemesis Neurologic:  No visual changes, wkns, changes in mental status. All other systems reviewed and are otherwise negative except as noted above.  Physical  Exam  Blood pressure 133/78, pulse 62, temperature 98.3 F (36.8 C), temperature source Oral, resp. rate 17, height 5\' 8"  (1.727 m), weight 173 lb 1 oz (78.5 kg), SpO2 98.00%.  General: Pleasant, NAD Psych: Normal affect. Neuro: Alert and oriented X 3. Moves all extremities spontaneously. HEENT: Normal  Neck: Supple without bruits or JVD. Lungs:  Resp regular and unlabored, CTA. Heart: RRR no s3, s4, or murmurs.  Frequent ectopy. Abdomen: Soft, non-tender, non-distended, BS + x 4.  Extremities: No clubbing, cyanosis or edema. DP/PT/Radials 2+ and equal bilaterally.  Labs  Pending  Radiology/Studies  No results found.  ECG  Pending  ASSESSMENT AND PLAN  1.  NSVT w/ history of VT storm:  Presents today for monitoring off of antiarrhythmics with plan for elective repeat VT ablation tomorrow.  Will check baseline labs today.  Cont BB.  Hold sotalol/mexiletine.  2.  ICM/Chronic Systolic CHF:  Euvolemic.  Cont home meds, including coumadin.  Check INR.  3.  CAD:  No c/p.  Cont home meds.  4.  HTN:  Stable.   Signed, Nicolasa Ducking, NP 08/23/2011, 3:32 PM  Attending Note:   The patient was seen and examined.  Agree with assessment and plan as noted above.  Pt is a very pleasant gentleman with a hx of MI and subsequent VT.  He has had VT storm and has an AICD.  He has had several VT ablations and now is admitted for re-do ablation by Dr. Johney Frame tomorrow.  He stopped his mexilitine and sotolol yesterday.  His CHF is very well compensated.   Exam: Lungs : clear Cor:  RR with occasional premature beats Ext: no edema  Imp: 1. VT: for VT ablation tomorrow off antiarrhythmic meds.  NPO past midnight tonight.  Vesta Mixer, Montez Hageman., MD, [facility] 08/23/2011, 4:12 PM

## 2011-08-24 ENCOUNTER — Ambulatory Visit (HOSPITAL_COMMUNITY): Admission: RE | Admit: 2011-08-24 | Payer: Medicare Other | Source: Ambulatory Visit | Admitting: Internal Medicine

## 2011-08-24 ENCOUNTER — Encounter (HOSPITAL_COMMUNITY): Payer: Self-pay | Admitting: Anesthesiology

## 2011-08-24 ENCOUNTER — Inpatient Hospital Stay (HOSPITAL_COMMUNITY): Payer: Medicare Other | Admitting: Anesthesiology

## 2011-08-24 ENCOUNTER — Encounter (HOSPITAL_COMMUNITY)
Admission: AD | Disposition: A | Discharge status: Home / Self Care | Payer: Self-pay | Source: Ambulatory Visit | Attending: Internal Medicine

## 2011-08-24 DIAGNOSIS — I472 Ventricular tachycardia: Secondary | ICD-10-CM

## 2011-08-24 HISTORY — PX: V-TACH ABLATION: SHX5498

## 2011-08-24 LAB — PROTIME-INR
INR: 2.1 — ABNORMAL HIGH (ref 0.00–1.49)
Prothrombin Time: 23.9 seconds — ABNORMAL HIGH (ref 11.6–15.2)

## 2011-08-24 LAB — POCT ACTIVATED CLOTTING TIME: Activated Clotting Time: 160 seconds

## 2011-08-24 SURGERY — V-TACH ABLATION
Anesthesia: Monitor Anesthesia Care

## 2011-08-24 MED ORDER — PROPOFOL 10 MG/ML IV EMUL
INTRAVENOUS | Status: DC | PRN
  Administered 2011-08-24: 50 ug/kg/min via INTRAVENOUS

## 2011-08-24 MED ORDER — HYDROCODONE-ACETAMINOPHEN 5-325 MG PO TABS: 1.0000 | ORAL_TABLET | ORAL | Status: DC | PRN

## 2011-08-24 MED ORDER — SODIUM CHLORIDE 0.9 % IV SOLN: 250.0000 mL | INTRAVENOUS | Status: DC | PRN

## 2011-08-24 MED ORDER — ONDANSETRON HCL 4 MG/2ML IJ SOLN: 4.0000 mg | Freq: Once | INTRAMUSCULAR | Status: DC | PRN

## 2011-08-24 MED ORDER — EPHEDRINE SULFATE 50 MG/ML IJ SOLN
INTRAMUSCULAR | Status: DC | PRN
  Administered 2011-08-24: 10 mg via INTRAVENOUS

## 2011-08-24 MED ORDER — PROTAMINE SULFATE 10 MG/ML IV SOLN
INTRAVENOUS | Status: DC | PRN
  Administered 2011-08-24 (×3): 10 mg via INTRAVENOUS

## 2011-08-24 MED ORDER — HYDROMORPHONE HCL PF 1 MG/ML IJ SOLN: 0.2500 mg | INTRAMUSCULAR | Status: DC | PRN

## 2011-08-24 MED ORDER — SODIUM CHLORIDE 0.9 % IJ SOLN: 3.0000 mL | INTRAMUSCULAR | Status: DC | PRN

## 2011-08-24 MED ORDER — HEPARIN SODIUM (PORCINE) 1000 UNIT/ML IJ SOLN
INTRAMUSCULAR | Status: AC
  Filled 2011-08-24: qty 1

## 2011-08-24 MED ORDER — HEPARIN SODIUM (PORCINE) 1000 UNIT/ML IJ SOLN
INTRAMUSCULAR | Status: DC | PRN
  Administered 2011-08-24: 3000 [IU] via INTRAVENOUS
  Administered 2011-08-24: 5000 [IU] via INTRAVENOUS
  Administered 2011-08-24: 3000 [IU] via INTRAVENOUS
  Administered 2011-08-24: 1000 [IU] via INTRAVENOUS

## 2011-08-24 MED ORDER — MEXILETINE HCL 150 MG PO CAPS
150.0000 mg | ORAL_CAPSULE | Freq: Three times a day (TID) | ORAL | Status: DC
  Administered 2011-08-24 – 2011-08-25 (×2): 150 mg via ORAL
  Filled 2011-08-24 (×5): qty 1

## 2011-08-24 MED ORDER — ACETAMINOPHEN 325 MG PO TABS: 650.0000 mg | ORAL_TABLET | ORAL | Status: DC | PRN

## 2011-08-24 MED ORDER — ONDANSETRON HCL 4 MG/2ML IJ SOLN: 4.0000 mg | Freq: Four times a day (QID) | INTRAMUSCULAR | Status: DC | PRN

## 2011-08-24 MED ORDER — SODIUM CHLORIDE 0.9 % IV SOLN
0.4000 ug/kg/h | INTRAVENOUS | Status: DC
  Administered 2011-08-24: 0.4 ug/kg/h via INTRAVENOUS
  Filled 2011-08-24: qty 2

## 2011-08-24 MED ORDER — SOTALOL HCL 120 MG PO TABS
120.0000 mg | ORAL_TABLET | Freq: Two times a day (BID) | ORAL | Status: DC
  Administered 2011-08-24: 120 mg via ORAL
  Filled 2011-08-24 (×3): qty 1

## 2011-08-24 MED ORDER — BUPIVACAINE HCL (PF) 0.25 % IJ SOLN
INTRAMUSCULAR | Status: AC
  Filled 2011-08-24: qty 60

## 2011-08-24 MED ORDER — CARVEDILOL 6.25 MG PO TABS
6.2500 mg | ORAL_TABLET | Freq: Two times a day (BID) | ORAL | Status: DC
  Administered 2011-08-24 – 2011-08-25 (×2): 6.25 mg via ORAL
  Filled 2011-08-24 (×4): qty 1

## 2011-08-24 MED ORDER — SODIUM CHLORIDE 0.9 % IV SOLN
200.0000 ug | INTRAVENOUS | Status: DC | PRN
  Administered 2011-08-24: 20 ug via INTRAVENOUS

## 2011-08-24 MED ORDER — FENTANYL CITRATE 0.05 MG/ML IJ SOLN
INTRAMUSCULAR | Status: DC | PRN
  Administered 2011-08-24: 50 ug via INTRAVENOUS
  Administered 2011-08-24 (×2): 25 ug via INTRAVENOUS
  Administered 2011-08-24: 50 ug via INTRAVENOUS
  Administered 2011-08-24: 25 ug via INTRAVENOUS
  Administered 2011-08-24: 50 ug via INTRAVENOUS
  Administered 2011-08-24: 25 ug via INTRAVENOUS

## 2011-08-24 MED ORDER — SODIUM CHLORIDE 0.9 % IJ SOLN: 3.0000 mL | Freq: Two times a day (BID) | INTRAMUSCULAR | Status: DC

## 2011-08-24 MED ORDER — PHENYLEPHRINE HCL 10 MG/ML IJ SOLN
INTRAMUSCULAR | Status: DC | PRN
  Administered 2011-08-24 (×6): 100 ug via INTRAVENOUS

## 2011-08-24 MED ORDER — SODIUM CHLORIDE 0.9 % IV SOLN
INTRAVENOUS | Status: DC | PRN
  Administered 2011-08-24 (×2): via INTRAVENOUS

## 2011-08-24 MED ORDER — MIDAZOLAM HCL 5 MG/5ML IJ SOLN
INTRAMUSCULAR | Status: DC | PRN
  Administered 2011-08-24 (×3): 0.5 mg via INTRAVENOUS
  Administered 2011-08-24: 2 mg via INTRAVENOUS
  Administered 2011-08-24: 0.5 mg via INTRAVENOUS

## 2011-08-24 NOTE — Preoperative (Signed)
Beta Blockers   Reason not to administer Beta Blockers:Not Applicable 

## 2011-08-24 NOTE — Progress Notes (Signed)
Patient had 7 beats of V-tach. Patient asymptomatic, no CP, no SOB. VSS with BP of 139/77, HR 89. Fae Pippin PA notified @ 2031. No new orders received. Will continue to monitor patient.

## 2011-08-24 NOTE — H&P (View-Only) (Signed)
PCP:  Julian Hy, MD, MD Primary EP:  Dr Jalene Mullet I Brink is a 71 y.o. Male with recurrent refractory VT who presents today as an add on patient due to ongoing symptoms of VT.  He has been on multiple medications in the past, and underwent catheter ablation in the setting of VT storm September 2012. He had post ablation ventricular tachycardia and was started on amiodarone which had previously not been used because of concurrent hypothyroidism. Because of recurrent ventricular tachycardia he was transferred to [facility] for incessant VT and underwent an ablation complicated by pericardial perforation and subsequent pericarditis.  He developed recurrent symptoms of VT and was placed on sotalol and mexiletine. He was recently seen by Dr Graciela Husbands and found to have further VT.  Sotalol was increased to 120mg  BID.  Since that time however, he continues to have frequent tachypalpitations.  He therefore called our office today requesting to be seen.   His history of coronary artery disease with prior MI in 1999 treated percutaneously stenting to his RCA in 2005. Last cath in 2010 showing patent stents in the circumflex  and right coronary artery.  Ischemic cardiomyopathy with ejection fraction of 25-30% by echo in February 2012,    Past Medical History  Diagnosis Date  . CAD (coronary artery disease) 1999    Remote anterolater and apical MI, cath 2011 patent stents RCA/LCx  LAD w/o obstruction  . Cardiomyopathy, ischemic     EF 20-25%  echo 2012  . Chronic systolic congestive heart failure   . Ventricular tachycardia     recurrent slow VT at 100-110 bpm,  Rx mexilitene/sotalol  . ICD (implantable cardiac defibrillator) in place     [place] Jude  . 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 Abdominal aortic aneurysm 2009    Repaired with endovascular stent  . PVD (peripheral vascular disease)     Has occluded innominate vein  . Left bundle branch block    Past Surgical History  Procedure  Date  . Icd Feb 2012    Change out with LV lead placed with tunneling from the right  to the left side  . Endovascular stent insertion     for AAA  . Cardiac catheterization     x2  . Pacemaker insertion   . Ventricular ablation surgery     s/p prior EPS and ablation at [facility] and also at [facility]    Current Outpatient Prescriptions  Medication Sig Dispense Refill  . ALPRAZolam (XANAX) 0.25 MG tablet Take 0.25 mg by mouth 4 (four) times daily as needed. For anxiety       . Ascorbic Acid (VITAMIN C) 500 MG tablet Take 500 mg by mouth daily.        . carvedilol (COREG) 6.25 MG tablet Take one tablet by mouth every morning and two tablets by mouth every evening  270 tablet  3  . levothyroxine (SYNTHROID, LEVOTHROID) 88 MCG tablet Take 88 mcg by mouth daily. Take 1 tablet daily and take 1.5 tablets on Thursdays      . Loratadine (CLARITIN) 10 MG CAPS Take 1 capsule by mouth daily.       [name] losartan (COZAAR) 50 MG tablet Take 25 mg by mouth 2 (two) times daily.       . meclizine (ANTIVERT) 25 MG tablet Take 25 mg by mouth 2 (two) times daily.        [name] mexiletine (MEXITIL) 150 MG capsule Take 150  mg by mouth 3 (three) times daily.       . MULTIPLE VITAMIN PO Take 1 tablet by mouth daily.       . Omega-3 Fatty Acids (FISH OIL) 1000 MG CAPS Take by mouth 2 (two) times daily.        . potassium chloride SA (K-DUR,KLOR-CON) 20 MEQ tablet Take 20 mEq by mouth daily. Monday, Wednesday, friday      . rosuvastatin (CRESTOR) 5 MG tablet Take 5 mg by mouth every other day.        . sotalol (BETAPACE) 80 MG tablet Take 1 1/2 tablets by mouth twice daily      . spironolactone (ALDACTONE) 25 MG tablet Take 1/2 tablet by mouth once daily  90 tablet  2  . warfarin (COUMADIN) 5 MG tablet Take 1.25-2.5 mg by mouth daily. As directed by the coumadin clinic, take 1.25 mg on Tuesday and Saturday and 2.5 mg the remaining days          Allergies  Allergen Reactions  . Iohexol      Code: HIVES, Desc: hives w/  itching during cardiac cath '99, mult caths since w/ ? premeds, DR.G.HAYES REQUESTS 13 HR PRE MED//A.C.pt okay w/13 hr prep/mms, Onset Date: [id number]   . Prednisone Other (See Comments)    unknown    History   Social History  . Marital Status: Married    Spouse Name: N/A    Number of Children: N/A  . Years of Education: N/A   Occupational History  . retired      used to Midwife   Social History Main Topics  . Smoking status: Former Games developer  . Smokeless tobacco: Former Neurosurgeon    Quit date: 06/01/1987  . Alcohol Use: Yes     very rare  . Drug Use: No  . Sexually Active: Not on file   Other Topics Concern  . Not on file   Social History Narrative  . No narrative on file    Family History  Problem Relation Age of Onset  . Stroke Mother   . Heart attack Father   . Heart attack Mother     ROS-  All systems are reviewed and are negative except as outlined in the HPI above   Physical Exam: Filed Vitals:   07/30/11 1344  BP: 130/60  Pulse: 74  Weight: 175 lb 8 oz (79.606 kg)    GEN- The patient is anxious appearing, alert and oriented x 3 today.   Head- normocephalic, atraumatic Eyes-  Sclera clear, conjunctiva pink Ears- hearing intact Oropharynx- clear Neck- supple, no JVP Lymph- no cervical lymphadenopathy Lungs- Clear to ausculation bilaterally, normal work of breathing Chest- ICD pocket is well healed Heart- Regular rate and rhythm with frequent ectopy GI- soft, NT, ND, + BS Extremities- no clubbing, cyanosis, or edema  ICD interrogation- reviewed in detail today,  See PACEART report  ekg today reveals AV pacing with frequent PVCs (RBB superior axis) and Qt  Assessment and Plan:

## 2011-08-24 NOTE — Anesthesia Preprocedure Evaluation (Addendum)
Anesthesia Evaluation  Patient identified by MRN, date of birth, ID band Patient awake    Reviewed: Allergy & Precautions, H&P , NPO status , Patient's Chart, lab work & pertinent test results, reviewed documented beta blocker date and time   Airway Mallampati: II TM Distance: >3 FB Neck ROM: Full    Dental  (+) Teeth Intact and Dental Advisory Given   Pulmonary former smoker breath sounds clear to auscultation        Cardiovascular hypertension, Pt. on medications + CAD, + Past MI and + Peripheral Vascular Disease + dysrhythmias + Cardiac Defibrillator Rhythm:Regular Rate:Normal     Neuro/Psych Anxiety negative neurological ROS     GI/Hepatic Neg liver ROS,   Endo/Other  negative endocrine ROS  Renal/GU negative Renal ROS     Musculoskeletal  (+) Arthritis -, Osteoarthritis,    Abdominal   Peds  Hematology negative hematology ROS (+)   Anesthesia Other Findings   Reproductive/Obstetrics                         Anesthesia Physical Anesthesia Plan  ASA: III  Anesthesia Plan: MAC   Post-op Pain Management:    Induction: Intravenous  Airway Management Planned: Nasal Cannula  Additional Equipment:   Intra-op Plan:   Post-operative Plan:   Informed Consent: I have reviewed the patients History and Physical, chart, labs and discussed the procedure including the risks, benefits and alternatives for the proposed anesthesia with the patient or authorized representative who has indicated his/her understanding and acceptance.   Dental advisory given  Plan Discussed with: CRNA  Anesthesia Plan Comments: (Ischemic cardiomyopathy   EF 20-25%  Plan GA)       Anesthesia Quick Evaluation

## 2011-08-24 NOTE — Brief Op Note (Signed)
08/23/2011 - 08/24/2011  12:01 PM  PATIENT:  [name]  71 y.o. male  PRE-OPERATIVE DIAGNOSIS:  v-tach  POST-OPERATIVE DIAGNOSIS:  * No post-op diagnosis entered *  PROCEDURE:  Procedure(s) (LRB): V-TACH ABLATION (N/A)  SURGEON:  Surgeon(s) and Role:    * Hillis Range, MD - Primary  PHYSICIAN ASSISTANT:   ASSISTANTS: none   ANESTHESIA:   IV sedation  EBL:  Total I/O In: 1200 [I.V.:1200] Out: -   BLOOD ADMINISTERED:none  DRAINS: none   LOCAL MEDICATIONS USED:  LIDOCAINE   SPECIMEN:  No Specimen  DISPOSITION OF SPECIMEN:  N/A  COUNTS:  YES  TOURNIQUET:  * No tourniquets in log *  DICTATION: .Other Dictation: Dictation Number [id number]  PLAN OF CARE: Admit to inpatient   PATIENT DISPOSITION:  PACU - hemodynamically stable.   Delay start of Pharmacological VTE agent (>24hrs) due to surgical blood loss or risk of bleeding: not applicable

## 2011-08-24 NOTE — Anesthesia Postprocedure Evaluation (Signed)
  Anesthesia Post-op Note  Patient: [name]  Procedure(s) Performed: Procedure(s) (LRB): V-TACH ABLATION (N/A)  Patient Location: Cath Lab  Anesthesia Type: MAC  Level of Consciousness: awake and alert   Airway and Oxygen Therapy: Patient Spontanous Breathing and Patient connected to nasal cannula oxygen  Post-op Pain: none  Post-op Assessment: Post-op Vital signs reviewed and Patient's Cardiovascular Status Stable  Post-op Vital Signs: stable  Complications: No apparent anesthesia complications

## 2011-08-24 NOTE — Interval H&P Note (Signed)
History and Physical Interval Note:  08/24/2011 7:26 AM  [name]  has presented today for surgery, with the diagnosis of v-tach  The various methods of treatment have been discussed with the patient and family. After consideration of risks, benefits and other options for treatment, the patient has consented to  Procedure(s) (LRB): V-TACH ABLATION (N/A) as a surgical intervention .  The patients' history has been reviewed, patient examined, no change in status, stable for surgery.  I have reviewed the patients' chart and labs.  Questions were answered to the patient's satisfaction.     Hillis Range

## 2011-08-24 NOTE — Transfer of Care (Signed)
Immediate Anesthesia Transfer of Care Note  Patient: [name]  Procedure(s) Performed: Procedure(s) (LRB): V-TACH ABLATION (N/A)  Patient Location: PACU  Anesthesia Type: MAC  Level of Consciousness: awake, alert , oriented and sedated  Airway & Oxygen Therapy: Patient Spontanous Breathing and Patient connected to nasal cannula oxygen  Post-op Assessment: Report given to PACU RN and Post -op Vital signs reviewed and stable  Post vital signs: Reviewed and stable  Complications: No apparent anesthesia complications

## 2011-08-24 NOTE — Anesthesia Procedure Notes (Signed)
Procedure Name: MAC Date/Time: 08/24/2011 7:40 AM Performed by: Tyrone Nine Pre-anesthesia Checklist: Patient identified, Emergency Drugs available, Suction available, Patient being monitored and Timeout performed Patient Re-evaluated:Patient Re-evaluated prior to inductionOxygen Delivery Method: Nasal cannula and Simple face mask Intubation Type: IV induction

## 2011-08-25 ENCOUNTER — Other Ambulatory Visit: Payer: Self-pay

## 2011-08-25 DIAGNOSIS — I472 Ventricular tachycardia, unspecified: Principal | ICD-10-CM

## 2011-08-25 LAB — PROTIME-INR: Prothrombin Time: 26.8 seconds — ABNORMAL HIGH (ref 11.6–15.2)

## 2011-08-25 MED ORDER — SOTALOL HCL 80 MG PO TABS
160.0000 mg | ORAL_TABLET | Freq: Two times a day (BID) | ORAL | Status: DC
  Administered 2011-08-25: 160 mg via ORAL
  Filled 2011-08-25 (×3): qty 2

## 2011-08-25 NOTE — Op Note (Signed)
NAMEMarland [name]  AFTON, MIKELSON NO.:  [id number]  MEDICAL RECORD NO.:  [id number]  LOCATION:  3732                         FACILITY:  [facility]  PHYSICIAN:  Hillis Range, MD       DATE OF BIRTH:  1941/04/11  DATE OF PROCEDURE: DATE OF DISCHARGE:                              OPERATIVE REPORT   SURGEON:  Hillis Range, MD  PROCEDURES: 1. Comprehensive EP study. 2. Left ventricular recording and pacing. 3. 3D mapping of ventricular tachycardia. 4. Ablation of ventricular tachycardia. 5. Arterial blood pressure monitoring. 6. Biventricular ICD interrogation and reprogramming.  DIAGNOSIS:  Ischemic ventricular tachycardia.  POSTPROCEDURE DIAGNOSIS:  Ischemic ventricular tachycardia.  INTRODUCTION:  [name] is a pleasant 71 year old gentleman with a prior ischemic cardiomyopathy and recurrent symptomatic ventricular tachycardia.  He has had 2 prior VT ablations initially by me in September 2012 and subsequently at [facility] in December 2012.  He recently has developed recurrent symptomatic ventricular tachycardia, refractory to medical therapy with sotalol and mexiletine.  He therefore presents today for repeat EP study and radiofrequency ablation.  DESCRIPTION OF THE PROCEDURE:  Informed written consent was obtained and the patient was brought to the electrophysiology lab in the fasting state.  He was adequately sedated with intravenous medications as outlined in the anesthesia report.  His mexiletine and sotalol had been held for 48 hours prior to the procedure.  The patient's ICD was interrogated and therapies were programmed off.  He was programmed VVI at 40 beats per minute.  Both groins were prepped and draped in the usual sterile fashion by the EP lab staff.  Using a percutaneous Seldinger technique, two 6-French and one 8-French hemostasis sheaths were placed into the right common femoral vein.  An 8-French long sheath was placed into the right common femoral  artery.  The patient has known peripheral vascular disease and placement of the right common femoral artery catheter was challenging initially.  Two 6-French quadripolar Josephson catheters were introduced through the right common femoral vein and advanced into the His bundle and right ventricular apex positions respectively.  The patient presented to the electrophysiology lab in normal sinus rhythm.  His PR interval was 271 msec with a QRS duration of 203 msec and a QT interval of 494 msec.  His average RR interval was 862 msec.  His AH interval measured 166 msec with an HV interval of 55 msec.  The patient was noted to have frequent polymorphic PVCs of both right bundle-branch and left bundle-branch morphologies.  I elected to perform 3D mapping and ablation within the left ventricle in the area the patient's known prior anterior scar.  A 3.5-mm Biosense Webster EZ Steer THERMOCOOL ablation catheter was therefore advanced into the right common femoral artery and advanced into the left ventricle using a retrograde aortic approach.  Heparin was administered for adequate anticoagulation throughout the case.  Three-dimensional electroanatomical mapping of the left ventricle was performed using [employer].  A voltage map was initially generated, which revealed a large anteroapical scar.  The scar was noted to be large and dense anteriorly.  With catheter manipulation, the patient developed sustained ventricular tachycardia.  This first ventricular tachycardia was of a right bundle-branch inferior axis  with a cycle length of 420 msec.  The patient was hemodynamically stable initially during tachycardia.  Three- dimensional electroanatomical mapping was therefore performed, which demonstrated that this tachycardia arose from the basal anteroseptal border of the scar.  During mapping, unfortunately the patient degenerated into a second ventricular tachycardia, which was of a  right bundle-branch inferior axis osso.  This, however, was clearly a different tachycardia.  The tachycardia cycle length measured 348 msec. The patient became hemodynamically stable and the tachycardia was therefore pace terminated before it could be mapped.  I returned my attention to the first ventricular tachycardia and delivered a series of radiofrequency applications along the region of the anteroseptal base. Pace mapping was performed from the left ventricle, which revealed a 12/12 pace map match in this area.  Additional radiofrequency ablation was delivered until I could note no longer capture tissue in this area. I then decided to turn my attention to VT #2.  There was a small basal scar along the anterior ridge between the aortic valve and the mitral valve anulus.  Pace mapping in this area revealed a 12/12 pace map match.  Ablation was therefore delivered extensively along this area for substrate modification.  This ablation lesion was extended along the anterobasal portion of the scar to connect it to the region modified for ventricular tachycardia #1.  Following ablation, ventricular extrastimulus testing was performed from the right ventricular apex with a basic cycle length of 500 msec with S1, S2, S3, S4 extra stimuli.  The second ventricular tachycardia was again induced with 500/290/290/300 msec.  The patient was now more stable during tachycardia.  Additional mapping was therefore performed during tachycardia and confirmed that the area of this scar was along the ridge between the aortic and mitral valves basally.  Additional radiofrequency current was delivered in this location along the border zone of the scar.  Ventricular extrastimulus testing was again performed and VT #2 could not be induced; however, with 500/320/310/280, a third ventricular tachycardia was induced.  This tachycardia was of a right bundle-branch inferior axis osso.  Also but with a cycle length  of 420 msec.  3D mapping of this tachycardia was performed, which revealed that this tachycardia arose along the basal portion of the left ventricular scar, but more laterally.  Additional radiofrequency current was therefore delivered as substrate modification within this area.  After extensive ablation within this region as well as further substrate modification along the basal lateral and basal septal portion of this anterior scar, the patient was observed. Ventricular extrastimulus testing was again performed with a basic cycle length of 500 msec with S1, S2, S3, S4 extrastimuli down to refractoriness (500/320/310/230 msec) with no inducible arrhythmias. The patient had frequent polymorphic PVCs.  Due to their polymorphic nature, I did not feel that these were amenable to catheter ablation today.  The patient was observed without recurrence of tachycardia. Following ablation, his AH interval measured 177 msec with an HV interval of 60 msec.  Ventricular pacing revealed VA dissociation. Atrial pacing revealed an AV Wenckebach cycle length of 490 msec.  The procedure was therefore considered completed.  All catheters were removed and the sheaths were aspirated and flushed.  The sheaths were removed and hemostasis was assured.  There were no early apparent complications.  The patient's defibrillator was interrogated.  Atrial lead P-waves measured 2.6 mV with an impedance of 430 ohms and a threshold of 1.5 V at 0.7 msec.  Right ventricular lead R-waves measured 11.8 mV with  an impedance of 310 ohms and a threshold of 1.25 V at 0.5 msec.  The left ventricular lead impedance was 790 ohms with a pacing threshold of 1 V at 0.5 msec.  These were felt to be stable from prior to the procedure.  The device was reprogrammed up DDIR as previously programmed with a basic rate of 70 beats per minutes with LV prior to RV pacing by 40 msec.  VT detection and therapies were programmed back on as per  their prior settings.  The patient was transferred to the recovery area.  There were no early apparent complications.  CONCLUSIONS: 1. Recurrent ischemic ventricular tachycardia.  He had 3 separate VT     morphologies, which arose from the basal portion of a large     anterior scar.  Following ablation, ventricular tachycardia was no     longer inducible. 2. No early apparent complications.     Hillis Range, MD     JA/MEDQ  D:  08/24/2011  T:  08/25/2011  Job:  [id number]  cc:   [name], MD, [facility] Cassell Clement, M.D.

## 2011-08-25 NOTE — Discharge Summary (Signed)
ELECTROPHYSIOLOGY PROCEDURE DISCHARGE SUMMARY    Patient ID: [name],  MRN: [id number], DOB/AGE: [birth year] 71 y.o.  Admit date: 08/23/2011 Discharge date: 08/25/2011  Primary Care Physician: Adrian Prince, MD Primary Cardiologist: Hillis Range, MD  Primary Discharge Diagnosis:  Ventricular tachycardia status post ablation this admission  Secondary Discharge Diagnosis:  1.  CAD status post remote anterolateral and apical MI, cath 2011 demonstrated patent stents to RCA/LCx LAD without obstruction 2.  Ischemic cardiomyopathy status post ICD implantation ([employer]) 3.  Chronic systolic heart failure 4.  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  AAA repaired with endovascular stent 6.  PVD 7.  Hypertension 8.  VT- status post ablation previously at [facility] and at [facility] 9.  LBBB   Procedures This Admission:  1.  Electrophysiology study and radiofrequency catheter ablation of ventricular tachycardia on 08-24-2011 by Dr Johney Frame. There were three separate VT morphologies identified and ablated.  VT was no longer inducible following ablation.  The patient had no early apparent complications.   Brief HPI: [name] is a 71 year old male with a history of ischemic cardiomyopathy and ventricular tachycardia.  He has failed medical therapy for his VT.  He has had 2 prior ablation procedures but has had recurrent tachycardia.  He was evaluated by Dr Johney Frame and repeat ablation was recommended.  Risks, benefits, and alternatives were discussed with the patient and he wished to proceed.  Hospital Course:  The patient was admitted on 08-23-2011 for planned ablation of ventricular tachycardia.  He was admitted the day before procedure secondary to stopping his AAD.  Ablation was carried out on 08-24-11 by Dr Johney Frame with details as outlined in his operative report.  He was monitored on telemetry overnight which demonstrated sinus rhythm with PVC's.  His groin was without hematoma or bruit.  Dr  Johney Frame examined the patient and considered him stable for discharge to home with close outpatient follow up.   Discharge Vitals: Blood pressure 127/67, pulse 78, temperature 98.2 F (36.8 C), temperature source Oral, resp. rate 18, height 5\' 8"  (1.727 m), weight 175 lb 14.4 oz (79.788 kg), SpO2 100.00%.   Labs:   Lab Results  Component Value Date   WBC 8.2 08/23/2011   HGB 14.3 08/23/2011   HCT 43.6 08/23/2011   MCV 92.6 08/23/2011   PLT 213 08/23/2011    Lab 08/23/11 1628  NA 139  K 4.3  CL 101  CO2 29  BUN 15  CREATININE 1.13  CALCIUM 9.6  PROT 7.3  BILITOT 0.3  ALKPHOS 54  ALT 16  AST 19  GLUCOSE 98    Discharge Medications:  Medication List  As of 08/25/2011 10:12 AM   TAKE these medications         ALPRAZolam 0.25 MG tablet   Commonly known as: XANAX   Take 0.25 mg by mouth 4 (four) times daily as needed. For anxiety      carvedilol 6.25 MG tablet   Commonly known as: COREG   Take 6.25-12.5 mg by mouth 2 (two) times daily with a meal. Take 1 tablet in the morning and 2 tablets in the evening      CLARITIN 10 MG Caps   Generic drug: Loratadine   Take 1 capsule by mouth daily.      Fish Oil 1000 MG Caps   Take 1,000 mg by mouth 2 (two) times daily.      levothyroxine 88 MCG tablet   Commonly known as: SYNTHROID, LEVOTHROID  Take 88 mcg by mouth daily.      losartan 50 MG tablet   Commonly known as: COZAAR   Take 25 mg by mouth 2 (two) times daily.      meclizine 25 MG tablet   Commonly known as: ANTIVERT   Take 25 mg by mouth 3 (three) times daily.      mexiletine 150 MG capsule   Commonly known as: MEXITIL   Take 150 mg by mouth 3 (three) times daily.      mulitivitamin with minerals Tabs   Take 1 tablet by mouth daily.      potassium chloride SA 20 MEQ tablet   Commonly known as: K-DUR,KLOR-CON   Take 20 mEq by mouth 3 (three) times a week. Monday, Wednesday, friday      rosuvastatin 5 MG tablet   Commonly known as: CRESTOR   Take 5 mg by  mouth every other day.      sotalol 120 MG tablet   Commonly known as: BETAPACE   Take 120 mg by mouth 2 (two) times daily. Take with 1/2 tablet of 80mg  pill to equal 160mg  twice daily      sotalol 80 MG tablet   Commonly known as: BETAPACE   Take 40 mg by mouth 2 (two) times daily. Take with 1 tablet of 120mg  pill to equal 160mg  twice daily      spironolactone 25 MG tablet   Commonly known as: ALDACTONE   Take 12.5 mg by mouth daily.      vitamin C 500 MG tablet   Commonly known as: ASCORBIC ACID   Take 500 mg by mouth daily.      warfarin 5 MG tablet   Commonly known as: COUMADIN   Take 1.25-2.5 mg by mouth daily. Take 1.25mg  on Tuesday, Thursday, and Saturday.  Take 2.5mg  on Sunday, Monday, Wednesday, Friday.              Disposition:  Discharge Orders    Future Appointments: Provider: Department: Dept Phone: Center:   09/06/2011 10:45 AM [facility] [phone] None   09/06/2011 11:15 AM Hillis Range, MD [facility] [phone] [facility]     Future Orders Please Complete By Expires   Diet - low sodium heart healthy      Increase activity slowly      Comments:   No driving. No lifting over 5 lbs for 1 week. No sexual activity for 1 week. Keep procedure site clean & dry. If you notice increased pain, swelling, bleeding or pus, call/return!  You may shower, but no soaking baths/hot tubs/pools for 1 week.       Follow-up Information    Follow up with [facility]. (09/06/11 at 10:45am for your Coumadin Clinic appointment and 11:15am appointment with Dr. Johney Frame)    Contact information:   [address] [place] [address] [phone]         Duration of Discharge Encounter: Greater than 30 minutes including physician time.  Signed, Gypsy Balsam, RN, BSN 08/25/2011, 10:12 AM    I have seen, examined the patient, and reviewed the above assessment and plan.   Co Sign: Hillis Range,  MD 08/25/2011 9:35 PM

## 2011-08-25 NOTE — Progress Notes (Signed)
SUBJECTIVE: The patient is doing well today s/p VT ablation.  At this time, he denies chest pain, shortness of breath, or any new concerns.     [name] atorvastatin  10 mg Oral q1800  . carvedilol  6.25 mg Oral BID WC  . levothyroxine  88 mcg Oral QAC breakfast  . loratadine  10 mg Oral Daily  . losartan  25 mg Oral BID  . meclizine  25 mg Oral TID  . mexiletine  150 mg Oral TID  . mulitivitamin with minerals  1 tablet Oral Daily  . omega-3 acid ethyl esters  1 g Oral BID  . potassium chloride SA  20 mEq Oral 3 times weekly  . sodium chloride  3 mL Intravenous Q12H  . sodium chloride  3 mL Intravenous Q12H  . sotalol  120 mg Oral BID  . spironolactone  12.5 mg Oral Daily  . warfarin  2.5 mg Oral Custom   And  . warfarin  1.25 mg Oral Custom  . Warfarin - Physician Dosing Inpatient   Does not apply q1800      . DISCONTD: dexmedetomidine (PRECEDEX) IV infusion      OBJECTIVE: Physical Exam: Filed Vitals:   08/24/11 2100 08/25/11 0001 08/25/11 0400 08/25/11 0737  BP: 137/68 110/69 120/76 127/70  Pulse: 77 76 73 72  Temp: 97.9 F (36.6 C) 97.7 F (36.5 C) 98.4 F (36.9 C) 98.1 F (36.7 C)  TempSrc: Oral Oral Oral Oral  Resp: 20 20 20 18   Height:      Weight:   175 lb 14.4 oz (79.788 kg)   SpO2: 97% 93% 99% 98%    Intake/Output Summary (Last 24 hours) at 08/25/11 0820 Last data filed at 08/25/11 6962  Gross per 24 hour  Intake   1103 ml  Output   1750 ml  Net   -647 ml    Telemetry reveals sinus rhythm with frequent PVCs  GEN- The patient is well appearing, alert and oriented x 3 today.   Head- normocephalic, atraumatic Eyes-  Sclera clear, conjunctiva pink Ears- hearing intact Oropharynx- clear Neck- supple  Lungs- Clear to ausculation bilaterally, normal work of breathing Heart- Regular rate and rhythm with frequent ectopy, no murmurs, rubs or gallops, PMI not laterally displaced GI- soft, NT, ND, + BS Extremities- no clubbing, cyanosis, or edema Skin- no  rash or lesion Psych- euthymic mood, full affect Neuro- strength and sensation are intact  LABS: Basic Metabolic Panel:  Basename 08/23/11 1628  NA 139  K 4.3  CL 101  CO2 29  GLUCOSE 98  BUN 15  CREATININE 1.13  CALCIUM 9.6  MG --  PHOS --   Liver Function Tests:  Basename 08/23/11 1628  AST 19  ALT 16  ALKPHOS 54  BILITOT 0.3  PROT 7.3  ALBUMIN 3.6   No results found for this basename: LIPASE:2,AMYLASE:2 in the last 72 hours CBC:  Basename 08/23/11 1628  WBC 8.2  NEUTROABS 4.2  HGB 14.3  HCT 43.6  MCV 92.6  PLT 213   Cardiac Enzymes: No results found for this basename: CKTOTAL:3,CKMB:3,CKMBINDEX:3,TROPONINI:3 in the last 72 hours BNP: No components found with this basename: POCBNP:3 D-Dimer: No results found for this basename: DDIMER:2 in the last 72 hours Hemoglobin A1C: No results found for this basename: HGBA1C in the last 72 hours Fasting Lipid Panel: No results found for this basename: CHOL,HDL,LDLCALC,TRIG,CHOLHDL,LDLDIRECT in the last 72 hours Thyroid Function Tests: No results found for this basename: TSH,T4TOTAL,FREET3,T3FREE,THYROIDAB in the last  72 hours Anemia Panel: No results found for this basename: VITAMINB12,FOLATE,FERRITIN,TIBC,IRON,RETICCTPCT in the last 72 hours  RADIOLOGY: No results found.  ASSESSMENT AND PLAN:  Active Problems:  Cardiomyopathy, ischemic  Chronic systolic heart failure  Ventricular tachycardia  ICD (implantable cardiac defibrillator), biventricular,[employer]  Sinus bradycardia  HTN (hypertension)  Coronary artery disease  Anxiety  1. VT- doing well s/p ablation Will DC to home this afternoon Resume home medications  Follow-up with me April 8th.  He probably does not need to see Dr Graciela Husbands on April 9th as scheduled.  I will return his care to Dr Graciela Husbands in 4-6 weeks.   Hillis Range, MD 08/25/2011 8:20 AM

## 2011-08-26 ENCOUNTER — Telehealth: Payer: Self-pay | Admitting: Internal Medicine

## 2011-08-26 NOTE — Telephone Encounter (Signed)
Pt's wife calling BP high 126/101 , wife wants to know if he needs to come in, pls [phone]

## 2011-08-26 NOTE — Telephone Encounter (Signed)
According to wife pt has had a 25 minute run of VT with a rate in the 120's with BP 126/101. This occurred while pt was lying down resting. He felt "breathless at times" His heart rate "is back to normal now 72-73 with a bp of 131/88 to 139/96 He is feeling tired. Will forward to [facility] & Dr. Johney Frame.  Wife will await call back. Mylo Red RN

## 2011-08-26 NOTE — Telephone Encounter (Signed)
Discussed with Dr Johney Frame and he suggested to increase his carvedilol to 12.5mg  bid  Pt aware and will let me know if he has another episode

## 2011-08-29 ENCOUNTER — Other Ambulatory Visit: Payer: Self-pay | Admitting: Cardiology

## 2011-08-31 ENCOUNTER — Encounter: Payer: Self-pay | Admitting: *Deleted

## 2011-08-31 ENCOUNTER — Telehealth: Payer: Self-pay | Admitting: *Deleted

## 2011-08-31 NOTE — Telephone Encounter (Signed)
DR Molinda Bailiff   [facility] CARDIOLOGY [facility] [address] [place], [place] [address]  [phone] (Office) [phone] (Fax) Lynford Humphrey  ----- Message ----- From: Jefferey Pica, RN Sent: 08/24/2011 5:20 PM To: [name], MD Subject: FW: event monitor results   What's the status on an EP person in [place]?  ----- Message ----- From: Connye Burkitt, NT Sent: 07/16/2011 10:19 AM To: Jefferey Pica, RN Subject: event monitor results   Mr. Whittmore came into the office to find out his results of his 7 day event monitor. He received the monitor on 06/25/11. Patient states he was curious about his monitor result . Patient also state that Dr. Graciela Husbands was sending a e-mail to a Electrophysiologist in [place] about his condition just in case he had any heart problems while in [place].                Letter mailed to the patient with the address of the EP doctor in [place].

## 2011-09-01 NOTE — Telephone Encounter (Signed)
Do not see on med list will forward to  Dr. Patty Sermons for review

## 2011-09-02 NOTE — Telephone Encounter (Signed)
Yes continue low dose Imdur 30 mg daily as long as he is tolerating it without side effects (headache etc)

## 2011-09-06 ENCOUNTER — Telehealth: Payer: Self-pay | Admitting: *Deleted

## 2011-09-06 ENCOUNTER — Encounter: Payer: Self-pay | Admitting: Internal Medicine

## 2011-09-06 ENCOUNTER — Ambulatory Visit (INDEPENDENT_AMBULATORY_CARE_PROVIDER_SITE_OTHER): Payer: Medicare Other | Admitting: Pharmacist

## 2011-09-06 ENCOUNTER — Ambulatory Visit (INDEPENDENT_AMBULATORY_CARE_PROVIDER_SITE_OTHER): Payer: Medicare Other | Admitting: Internal Medicine

## 2011-09-06 VITALS — BP 124/86 | HR 69 | Resp 18 | Ht 68.0 in | Wt 175.0 lb

## 2011-09-06 DIAGNOSIS — I255 Ischemic cardiomyopathy: Secondary | ICD-10-CM

## 2011-09-06 DIAGNOSIS — I2589 Other forms of chronic ischemic heart disease: Secondary | ICD-10-CM

## 2011-09-06 DIAGNOSIS — I472 Ventricular tachycardia, unspecified: Secondary | ICD-10-CM

## 2011-09-06 DIAGNOSIS — Z7901 Long term (current) use of anticoagulants: Secondary | ICD-10-CM

## 2011-09-06 DIAGNOSIS — I5022 Chronic systolic (congestive) heart failure: Secondary | ICD-10-CM

## 2011-09-06 MED ORDER — WARFARIN SODIUM 2.5 MG PO TABS: ORAL_TABLET | ORAL | Status: DC

## 2011-09-06 NOTE — Assessment & Plan Note (Signed)
Stable No change required today  

## 2011-09-06 NOTE — Telephone Encounter (Signed)
ERROR

## 2011-09-06 NOTE — Patient Instructions (Signed)
Your physician recommends that you schedule a follow-up appointment in: 4-6 weeks with Dr Juliann Pares appointment for tomorrow with Graciela Husbands

## 2011-09-06 NOTE — Assessment & Plan Note (Signed)
Doing well s/p VT ablation.  ICD interrogation today is reviewed and reveals only 1 episode of VT since ablation.  This episode occurred 08/26/11 with a CL of 465 msec, lasting 21 minutes before spontaneously terminating.  He called our office and I increased coreg to 12.5mg  BID.  He has had no further episodes since that time.  Continue current medicine regimen. No changes

## 2011-09-06 NOTE — Progress Notes (Signed)
PCP:  [name] Hy, MD, MD Primary EP:  Dr Graciela Husbands  The patient presents today for electrophysiology followup.  Since his ablation 08/23/11, the patient reports doing very well.  He denies procedure related complications.  He had one episode of VT 08/26/11 but none since.  He is presently very pleased with the results of his procedure.  He feels well and wants to become more active. Today, he denies symptoms of palpitations, chest pain, shortness of breath, groin concerns, or further ICD shocks.  The patient feels that he is tolerating medications without difficulties and is otherwise without complaint today.   Past Medical History  Diagnosis Date  . CAD (coronary artery disease) 1999    Remote anterolater and apical MI, cath 2011 patent stents RCA/LCx  LAD w/o obstruction  . Cardiomyopathy, ischemic     EF 20-25%  echo 2012  . Chronic systolic congestive heart failure   . ICD (implantable cardiac defibrillator) in place     [name]  . 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 Abdominal aortic aneurysm 2009    Repaired with endovascular stent  . PVD (peripheral vascular disease)     Has occluded innominate vein  . Myocardial infarction   . Hypertension   . Ventricular tachycardia     recurrent slow VT at 100-110 bpm,  Rx mexilitene/sotalol  . Left bundle branch block   . Systolic CHF, chronic   . Arthritis    Past Surgical History  Procedure Date  . Icd Feb 2012    Change out with LV lead placed with tunneling from the right  to the left side  . Endovascular stent insertion     for AAA  . Cardiac catheterization     x2  . Pacemaker insertion   . Ventricular ablation surgery     s/p prior EPS and ablation at [facility] 9/12, [facility] 11/12, and most recent at [facility] 08/23/11  . Tonsillectomy     Current Outpatient Prescriptions  Medication Sig Dispense Refill  . ALPRAZolam (XANAX) 0.25 MG tablet Take 0.25 mg by mouth 4 (four) times daily as needed. For anxiety       .  Ascorbic Acid (VITAMIN C) 500 MG tablet Take 500 mg by mouth daily.        . carvedilol (COREG) 12.5 MG tablet Take one tablet by mouth twice daily      . isosorbide mononitrate (IMDUR) 30 MG 24 hr tablet TAKE 1 TABLET DAILY  90 tablet  2  . levothyroxine (SYNTHROID, LEVOTHROID) 88 MCG tablet Take 88 mcg by mouth daily.       . Loratadine (CLARITIN) 10 MG CAPS Take 1 capsule by mouth daily.       [name] losartan (COZAAR) 50 MG tablet Take 25 mg by mouth 2 (two) times daily.       . meclizine (ANTIVERT) 25 MG tablet Take 25 mg by mouth 3 (three) times daily.       [name] mexiletine (MEXITIL) 150 MG capsule Take 150 mg by mouth 3 (three) times daily.       . Multiple Vitamin (MULITIVITAMIN WITH MINERALS) TABS Take 1 tablet by mouth daily.      . Omega-3 Fatty Acids (FISH OIL) 1000 MG CAPS Take 1,000 mg by mouth 2 (two) times daily.       . potassium chloride SA (K-DUR,KLOR-CON) 20 MEQ tablet Take 20 mEq by mouth 3 (three) times a week. Monday, Wednesday, friday      .  rosuvastatin (CRESTOR) 5 MG tablet Take 5 mg by mouth every other day.        . sotalol (BETAPACE) 120 MG tablet Take 120 mg by mouth 2 (two) times daily. Take with 1/2 tablet of 80mg  pill to equal 160mg  twice daily      . sotalol (BETAPACE) 80 MG tablet Take 40 mg by mouth 2 (two) times daily. Take with 1 tablet of 120mg  pill to equal 160mg  twice daily      . spironolactone (ALDACTONE) 25 MG tablet Take 12.5 mg by mouth daily.      [name] warfarin (COUMADIN) 2.5 MG tablet Take as directed by Anticoagulation clinic.  90 tablet  1    Allergies  Allergen Reactions  . Iohexol      Code: HIVES, Desc: hives w/ itching during cardiac cath '99, mult caths since w/ ? premeds, DR.G.HAYES REQUESTS 13 HR PRE MED//A.C.pt okay w/13 hr prep/mms, Onset Date: [id number]   . Prednisone Other (See Comments)    unknown    History   Social History  . Marital Status: Married    Spouse Name: N/A    Number of Children: N/A  . Years of Education: N/A    Occupational History  . retired      used to Midwife   Social History Main Topics  . Smoking status: Former Games developer  . Smokeless tobacco: Former Neurosurgeon    Quit date: 06/01/1987  . Alcohol Use: Yes     very rare  . Drug Use: No  . Sexually Active: Not Currently   Other Topics Concern  . Not on file   Social History Narrative  . No narrative on file    Family History  Problem Relation Age of Onset  . Stroke Mother   . Heart attack Father   . Heart attack Mother     Physical Exam: Filed Vitals:   09/06/11 1101  BP: 124/86  Pulse: 69  Resp: 18  Height: 5\' 8"  (1.727 m)  Weight: 175 lb (79.379 kg)    GEN- The patient is well appearing, alert and oriented x 3 today.   Head- normocephalic, atraumatic Eyes-  Sclera clear, conjunctiva pink Ears- hearing intact Oropharynx- clear Neck- supple, no JVP Lymph- no cervical lymphadenopathy Lungs- Clear to ausculation bilaterally, normal work of breathing Chest- ICD pocket is well healed Heart- Regular rate and rhythm, no murmurs, rubs or gallops, PMI not laterally displaced GI- soft, NT, ND, + BS Extremities- no clubbing, cyanosis, or edema  ICD interrogation- reviewed in detail today,  See PACEART report ekg today reveals AV pacing, with Qtc 496  Assessment and Plan:'

## 2011-09-07 ENCOUNTER — Telehealth: Payer: Self-pay | Admitting: Internal Medicine

## 2011-09-07 ENCOUNTER — Other Ambulatory Visit: Payer: Self-pay | Admitting: Cardiology

## 2011-09-07 ENCOUNTER — Encounter: Payer: Medicare Other | Admitting: Internal Medicine

## 2011-09-07 NOTE — Telephone Encounter (Signed)
When he left [facility] and

## 2011-09-07 NOTE — Telephone Encounter (Signed)
New msg Pt was here yesterday and had more questions to ask you. Please call

## 2011-09-07 NOTE — Telephone Encounter (Signed)
From the records the Isosorbide was stopped in 04/2011.  He can stay off at this point as he has not been taking it. He also wants to know about what fish Dr Johney Frame was talking about when he said to not eat fish.  I have let the patient know the above and will discuss with Dr Johney Frame tomorrow and call him back.  He is aware not to take the Imdur any longer

## 2011-09-09 ENCOUNTER — Encounter: Payer: Medicare Other | Admitting: *Deleted

## 2011-09-09 NOTE — Progress Notes (Signed)
Addended by: Reine Just on: 09/09/2011 10:42 AM   Modules accepted: Orders

## 2011-09-09 NOTE — Telephone Encounter (Signed)
Spoke with patient and let him know its any fish in salt water  Just to be cautious when at the beach

## 2011-09-27 ENCOUNTER — Ambulatory Visit (INDEPENDENT_AMBULATORY_CARE_PROVIDER_SITE_OTHER): Payer: Medicare Other

## 2011-09-27 DIAGNOSIS — I2589 Other forms of chronic ischemic heart disease: Secondary | ICD-10-CM

## 2011-09-27 DIAGNOSIS — Z7901 Long term (current) use of anticoagulants: Secondary | ICD-10-CM

## 2011-09-27 DIAGNOSIS — I255 Ischemic cardiomyopathy: Secondary | ICD-10-CM

## 2011-10-18 ENCOUNTER — Encounter: Payer: Medicare Other | Admitting: Internal Medicine

## 2011-10-26 ENCOUNTER — Encounter: Payer: Self-pay | Admitting: Internal Medicine

## 2011-10-26 ENCOUNTER — Ambulatory Visit (INDEPENDENT_AMBULATORY_CARE_PROVIDER_SITE_OTHER): Payer: Medicare Other | Admitting: Internal Medicine

## 2011-10-26 ENCOUNTER — Ambulatory Visit (INDEPENDENT_AMBULATORY_CARE_PROVIDER_SITE_OTHER): Payer: Medicare Other | Admitting: Pharmacist

## 2011-10-26 VITALS — BP 110/64 | HR 80 | Ht 68.5 in | Wt 178.0 lb

## 2011-10-26 DIAGNOSIS — I2510 Atherosclerotic heart disease of native coronary artery without angina pectoris: Secondary | ICD-10-CM

## 2011-10-26 DIAGNOSIS — Z7901 Long term (current) use of anticoagulants: Secondary | ICD-10-CM

## 2011-10-26 DIAGNOSIS — I255 Ischemic cardiomyopathy: Secondary | ICD-10-CM

## 2011-10-26 DIAGNOSIS — I2589 Other forms of chronic ischemic heart disease: Secondary | ICD-10-CM

## 2011-10-26 DIAGNOSIS — Z95810 Presence of automatic (implantable) cardiac defibrillator: Secondary | ICD-10-CM

## 2011-10-26 DIAGNOSIS — I472 Ventricular tachycardia: Secondary | ICD-10-CM

## 2011-10-26 DIAGNOSIS — I5022 Chronic systolic (congestive) heart failure: Secondary | ICD-10-CM

## 2011-10-26 DIAGNOSIS — N62 Hypertrophy of breast: Secondary | ICD-10-CM | POA: Insufficient documentation

## 2011-10-26 LAB — ICD DEVICE OBSERVATION
AL THRESHOLD: 1.25 V
ATRIAL PACING ICD: 99 pct
DEV-0020ICD: NEGATIVE
DEVICE MODEL ICD: 828324
LV LEAD IMPEDENCE ICD: 930 Ohm
RV LEAD AMPLITUDE: 11.8 mv
TZON-0003SLOWVT: 570 ms
TZON-0010SLOWVT: 40 ms
VENTRICULAR PACING ICD: 99 pct

## 2011-10-26 MED ORDER — EPLERENONE 25 MG PO TABS: ORAL_TABLET | ORAL | Status: DC

## 2011-10-26 NOTE — Patient Instructions (Addendum)
Remote monitoring is used to monitor your Pacemaker of ICD from home. This monitoring reduces the number of office visits required to check your device to one time per year. It allows [place] to keep an eye on the functioning of your device to ensure it is working properly. You are scheduled for a device check from home on February 03, 2012. You may send your transmission at any time that day. If you have a wireless device, the transmission will be sent automatically. After your physician reviews your transmission, you will receive a postcard with your next transmission date.  Your physician recommends that you schedule a follow-up appointment in: 3 months with Dr. Patty Sermons.  Your physician wants you to follow-up in: 6 months with Dr. Graciela Husbands. You will receive a reminder letter in the mail two months in advance. If you don't receive a letter, please call our office to schedule the follow-up appointment.  Your physician has recommended you make the following change in your medication:  1) Stop aldactone. 2) Start Inspra (eplerenone) 25 mg 1/2 tablet by mouth once daily.

## 2011-10-26 NOTE — Assessment & Plan Note (Signed)
No intercurrent ventricular tachycardia. We'll continue him on his mexiletine and sotalol. Surveillance blood work will be drawn next week.

## 2011-10-26 NOTE — Assessment & Plan Note (Signed)
Continue current medications. He has developed gynecomastia on Aldactone and we will change it to eplernone

## 2011-10-26 NOTE — Assessment & Plan Note (Signed)
As above.

## 2011-10-26 NOTE — Assessment & Plan Note (Signed)
conitue meds

## 2011-10-26 NOTE — Progress Notes (Signed)
HPI  [name] is a 71 y.o. male Is seen in followup for recurrent ventricular tachycardia. He has been on multiple medications in the past, and underwent catheter ablation in the setting of VT storm September 2012. He had post ablation ventricular tachycardia and was started on amiodarone which had previously not been used because of concurrent hypothyroidism.    Because of recurrent ventricular tachycardia he was transferred to [facility] for incessant VT and underwent an ablation complicated by pericardial perforation and subsequent pericarditis.  He was seen a couple of weeks after the last visit in the emergency room with frequent ventricular ectopy almost in a pattern of accelerated idioventricular rhythm and we put him on mexiletine. This has been associated with a decrease in his ventricular burden from 38% to 6% and a marked improvement in his overall symptom complex.  His energy is improving.  He then had recurrent ventricular tachycardia , which was relatively slow at 110-130 beats per minute and underwent repeat catheter ablation in March by Dr. Johney Frame He feels as best as he has in months. He has had no symptomatic palpitations. He is to see Dr. Evlyn Kanner in 2 weeks with blood work drawn then   Ischemic cardiomyopathy with ejection fraction of 25-30% by echo in February 2012   Past Medical History  Diagnosis Date  . CAD (coronary artery disease) 1999    Remote anterolater and apical MI, cath 2011 patent stents RCA/LCx  LAD w/o obstruction  . Cardiomyopathy, ischemic     EF 20-25%  echo 2012  . Chronic systolic congestive heart failure   . ICD (implantable cardiac defibrillator) in place     [name]  . 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 Abdominal aortic aneurysm 2009    Repaired with endovascular stent  . PVD (peripheral vascular disease)     Has occluded innominate vein  . Myocardial infarction   . Hypertension   . Ventricular tachycardia     recurrent slow VT at  100-110 bpm,  Rx mexilitene/sotalol  . Left bundle branch block   . Systolic CHF, chronic   . Arthritis     Past Surgical History  Procedure Date  . Icd Feb 2012    Change out with LV lead placed with tunneling from the right  to the left side  . Endovascular stent insertion     for AAA  . Cardiac catheterization     x2  . Pacemaker insertion   . Ventricular ablation surgery     s/p prior EPS and ablation at [facility] 9/12, [facility] 11/12, and most recent at [facility] 08/23/11  . Tonsillectomy     Current Outpatient Prescriptions  Medication Sig Dispense Refill  . ALPRAZolam (XANAX) 0.25 MG tablet Take 0.25 mg by mouth 4 (four) times daily as needed. For anxiety       . Ascorbic Acid (VITAMIN C) 500 MG tablet Take 500 mg by mouth daily.        . carvedilol (COREG) 12.5 MG tablet Take one tablet by mouth twice daily      . levothyroxine (SYNTHROID, LEVOTHROID) 88 MCG tablet Take 88 mcg by mouth daily.       . Loratadine (CLARITIN) 10 MG CAPS Take 1 capsule by mouth daily.       [name] losartan (COZAAR) 50 MG tablet Take 25 mg by mouth 2 (two) times daily.       . meclizine (ANTIVERT) 25 MG tablet Take 25 mg  by mouth 3 (three) times daily.       [name] mexiletine (MEXITIL) 150 MG capsule TAKE 1 CAPSULE THREE TIMES A DAY  270 capsule  4  . Multiple Vitamin (MULITIVITAMIN WITH MINERALS) TABS Take 1 tablet by mouth daily.      . Omega-3 Fatty Acids (FISH OIL) 1000 MG CAPS Take 1,000 mg by mouth 2 (two) times daily.       . potassium chloride SA (K-DUR,KLOR-CON) 20 MEQ tablet Take 20 mEq by mouth 3 (three) times a week. Monday, Wednesday, friday      . rosuvastatin (CRESTOR) 5 MG tablet Take 5 mg by mouth every other day.        . sotalol (BETAPACE) 80 MG tablet Take 160 mg by mouth 2 (two) times daily. Take with 2 tablets twice daily      . spironolactone (ALDACTONE) 25 MG tablet Take 12.5 mg by mouth daily.      [name] warfarin (COUMADIN) 2.5 MG tablet Take as directed by Anticoagulation clinic.  90  tablet  1    Allergies  Allergen Reactions  . Iohexol      Code: HIVES, Desc: hives w/ itching during cardiac cath '99, mult caths since w/ ? premeds, DR.G.HAYES REQUESTS 13 HR PRE MED//A.C.pt okay w/13 hr prep/mms, Onset Date: [id number]   . Prednisone Other (See Comments)    unknown    Review of Systems negative except from HPI and PMH  Physical Exam BP 110/64  Pulse 80  Ht 5' 8.5" (1.74 m)  Wt 178 lb (80.74 kg)  BMI 26.67 kg/m2 Well developed and well nourished in no acute distress HENT normal E scleral and icterus clear Neck Supple JVP flat; carotids brisk and full Clear to ausculation Regular rate and rhythm, no murmurs gallops or rub Soft with active bowel sounds No clubbing cyanosis none Edema Alert and oriented, grossly normal motor and sensory function Skin Warm and Dry    Assessment and  Plan

## 2011-10-26 NOTE — Assessment & Plan Note (Signed)
The patient's device was interrogated.  The information was reviewed. No changes were made in the programming.    

## 2011-11-17 ENCOUNTER — Encounter: Payer: Self-pay | Admitting: Internal Medicine

## 2011-11-23 ENCOUNTER — Ambulatory Visit (INDEPENDENT_AMBULATORY_CARE_PROVIDER_SITE_OTHER): Payer: Medicare Other | Admitting: Pharmacist

## 2011-11-23 DIAGNOSIS — I255 Ischemic cardiomyopathy: Secondary | ICD-10-CM

## 2011-11-23 DIAGNOSIS — I2589 Other forms of chronic ischemic heart disease: Secondary | ICD-10-CM

## 2011-11-23 DIAGNOSIS — Z7901 Long term (current) use of anticoagulants: Secondary | ICD-10-CM

## 2011-12-03 ENCOUNTER — Other Ambulatory Visit: Payer: Self-pay | Admitting: *Deleted

## 2011-12-03 MED ORDER — LOSARTAN POTASSIUM 50 MG PO TABS: 25.0000 mg | ORAL_TABLET | Freq: Two times a day (BID) | ORAL | Status: DC

## 2011-12-03 MED ORDER — WARFARIN SODIUM 2.5 MG PO TABS: ORAL_TABLET | ORAL | Status: DC

## 2011-12-14 ENCOUNTER — Ambulatory Visit (INDEPENDENT_AMBULATORY_CARE_PROVIDER_SITE_OTHER): Payer: Medicare Other | Admitting: *Deleted

## 2011-12-14 DIAGNOSIS — I255 Ischemic cardiomyopathy: Secondary | ICD-10-CM

## 2011-12-14 DIAGNOSIS — I2589 Other forms of chronic ischemic heart disease: Secondary | ICD-10-CM

## 2011-12-14 DIAGNOSIS — Z7901 Long term (current) use of anticoagulants: Secondary | ICD-10-CM

## 2011-12-24 ENCOUNTER — Encounter: Payer: Self-pay | Admitting: Surgery

## 2011-12-27 ENCOUNTER — Encounter: Payer: Self-pay | Admitting: Surgery

## 2011-12-27 ENCOUNTER — Ambulatory Visit (INDEPENDENT_AMBULATORY_CARE_PROVIDER_SITE_OTHER): Payer: Medicare Other | Admitting: *Deleted

## 2011-12-27 ENCOUNTER — Ambulatory Visit (INDEPENDENT_AMBULATORY_CARE_PROVIDER_SITE_OTHER): Payer: Medicare Other | Admitting: Surgery

## 2011-12-27 VITALS — BP 123/82 | HR 70 | Resp 16 | Ht 68.0 in | Wt 179.0 lb

## 2011-12-27 DIAGNOSIS — Z48812 Encounter for surgical aftercare following surgery on the circulatory system: Secondary | ICD-10-CM

## 2011-12-27 DIAGNOSIS — I714 Abdominal aortic aneurysm, without rupture: Secondary | ICD-10-CM

## 2011-12-27 NOTE — Addendum Note (Signed)
Addended by: Sharee Pimple on: 12/27/2011 01:56 PM   Modules accepted: Orders

## 2011-12-27 NOTE — Progress Notes (Signed)
[facility] [place]   Patient name: [name] MRN: [id number] DOB: 1940-07-03 Sex: male     Chief Complaint  Patient presents with  . AAA    one year f/up with lab work    HISTORY OF PRESENT ILLNESS: The patient is back today for followup. He is status post endovascular repair of an abdominal aortic aneurysm on 11/24/2007, performed by Dr. Madilyn Fireman and myself. A Endologix device was used with a proximal cuff. He has been followed with surveillance ultrasound. These have shown continual decrease in the size of his aneurysm.  The patient has undergone 3 cardiac ablations for tachycardia since I last saw him. He is recovering from this now. He is walking approximately 45 minutes per day 5 days a week.  Past Medical History  Diagnosis Date  . CAD (coronary artery disease) 1999    Remote anterolater and apical MI, cath 2011 patent stents RCA/LCx  LAD w/o obstruction  . Cardiomyopathy, ischemic     EF 20-25%  echo 2012  . Chronic systolic congestive heart failure   . ICD (implantable cardiac defibrillator) in place     [place] [facility]  . 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 Abdominal aortic aneurysm 2009    Repaired with endovascular stent  . PVD (peripheral vascular disease)     Has occluded innominate vein  . Myocardial infarction   . Hypertension   . Ventricular tachycardia     recurrent slow VT at 100-110 bpm,  Rx mexilitene/sotalol  . Left bundle branch block   . Systolic CHF, chronic   . Arthritis   . Gynecomastia     2/2 spironolactone    Past Surgical History  Procedure Date  . Icd Feb 2012    Change out with LV lead placed with tunneling from the right  to the left side  . Endovascular stent insertion     for AAA  . Cardiac catheterization     x2  . Pacemaker insertion   . Ventricular ablation surgery     s/p prior EPS and ablation at [facility] 9/12, [facility] 11/12, and most recent at [facility] 08/23/11  . Tonsillectomy     History    Social History  . Marital Status: Married    Spouse Name: N/A    Number of Children: N/A  . Years of Education: N/A   Occupational History  . retired      used to Midwife   Social History Main Topics  . Smoking status: Former Games developer  . Smokeless tobacco: Former Neurosurgeon    Quit date: 06/01/1987  . Alcohol Use: Yes     very rare  . Drug Use: No  . Sexually Active: Not Currently   Other Topics Concern  . Not on file   Social History Narrative  . No narrative on file    Family History  Problem Relation Age of Onset  . Stroke Mother   . Heart attack Father   . Heart attack Mother     Allergies as of 12/27/2011 - Review Complete 12/27/2011  Allergen Reaction Noted  . Iohexol  10/19/2007  . Prednisone Other (See Comments)     Current Outpatient Prescriptions on File Prior to Visit  Medication Sig Dispense Refill  . ALPRAZolam (XANAX) 0.25 MG tablet Take 0.25 mg by mouth 4 (four) times daily as needed. For anxiety       . Ascorbic Acid (VITAMIN C) 500 MG tablet Take  500 mg by mouth daily.        . carvedilol (COREG) 12.5 MG tablet Take one tablet by mouth twice daily      . eplerenone (INSPRA) 25 MG tablet Take 1/2 tablet by mouth once daily  90 tablet  2  . levothyroxine (SYNTHROID, LEVOTHROID) 88 MCG tablet Take 88 mcg by mouth daily.       . Loratadine (CLARITIN) 10 MG CAPS Take 1 capsule by mouth daily.       [name] losartan (COZAAR) 50 MG tablet Take 0.5 tablets (25 mg total) by mouth 2 (two) times daily.  90 tablet  3  . meclizine (ANTIVERT) 25 MG tablet Take 25 mg by mouth 3 (three) times daily.       [name] mexiletine (MEXITIL) 150 MG capsule TAKE 1 CAPSULE THREE TIMES A DAY  270 capsule  4  . Multiple Vitamin (MULITIVITAMIN WITH MINERALS) TABS Take 1 tablet by mouth daily.      . Omega-3 Fatty Acids (FISH OIL) 1000 MG CAPS Take 1,000 mg by mouth 2 (two) times daily.       . potassium chloride SA (K-DUR,KLOR-CON) 20 MEQ tablet Take 20 mEq by mouth 3 (three) times a  week. Monday, Wednesday, friday      . rosuvastatin (CRESTOR) 5 MG tablet Take 5 mg by mouth every other day.        . sotalol (BETAPACE) 80 MG tablet Take 160 mg by mouth 2 (two) times daily. Take with 2 tablets twice daily      . warfarin (COUMADIN) 2.5 MG tablet Take as directed by Anticoagulation clinic.  90 tablet  1     REVIEW OF SYSTEMS: Positive for varicose veins and weakness in his left leg for 3-4 weeks. All other systems are negative as documented by the patient in the encounter form. PHYSICAL EXAMINATION:   Vital signs are BP 123/82  Pulse 70  Resp 16  Ht 5\' 8"  (1.727 m)  Wt 179 lb (81.194 kg)  BMI 27.22 kg/m2  SpO2 97% General: The patient appears their stated age. HEENT:  No gross abnormalities Pulmonary:  Non labored breathing Abdomen: Soft and non-tender Musculoskeletal: There are no major deformities. Neurologic: No focal weakness or paresthesias are detected, Skin: There are no ulcer or rashes noted. Psychiatric: The patient has normal affect. Cardiovascular: There is a regular rate and rhythm without significant murmur appreciated. No carotid bruits. Palpable pedal pulses.   Diagnostic Studies Ultrasound was ordered and reviewed this shows a continued decrease in the size of his aneurysm. In 2011 that measured 3.7 x 3.2. Today it measures 3.0 x 3.1.  Assessment: Status post endovascular repair of abdominal aortic aneurysm Plan: The patient's aneurysm continues to decrease in size. He will continue with surveillance ultrasound screening. He will come back to see the nurse practitioner in one year with an ultrasound.  Jorge Ny, M.D. [facility] [place] Office: [phone] Pager:  [phone]

## 2012-01-03 NOTE — Procedures (Unsigned)
VASCULAR LAB EXAM  INDICATION:  Follow up AAA Endograft placed 11/24/2007.  HISTORY: Diabetes:  No Cardiac:  Yes Hypertension:  Yes  EXAM: 1. AAA sac size 3.03 cm AP, 3.12 cm transverse. 2. Previous sac size 02/16/2010, 3.7 cm and 3.28 cm transverse.  IMPRESSION: 1. The aorta and Endograft appear patent. 2. No significant change in size of aneurysmal sac surrounding the     Endograft. 3. No evidence of endoleak was detected.  ___________________________________________ V. Charlena Cross, MD  LT/MEDQ  D:  12/27/2011  T:  12/27/2011  Job:  [id number]

## 2012-01-04 ENCOUNTER — Ambulatory Visit (INDEPENDENT_AMBULATORY_CARE_PROVIDER_SITE_OTHER): Payer: Medicare Other | Admitting: Pharmacist

## 2012-01-04 DIAGNOSIS — I2589 Other forms of chronic ischemic heart disease: Secondary | ICD-10-CM

## 2012-01-04 DIAGNOSIS — Z7901 Long term (current) use of anticoagulants: Secondary | ICD-10-CM

## 2012-01-04 DIAGNOSIS — I255 Ischemic cardiomyopathy: Secondary | ICD-10-CM

## 2012-01-04 LAB — POCT INR: INR: 2.3

## 2012-01-27 ENCOUNTER — Ambulatory Visit (INDEPENDENT_AMBULATORY_CARE_PROVIDER_SITE_OTHER): Payer: Medicare Other | Admitting: Cardiology

## 2012-01-27 ENCOUNTER — Ambulatory Visit (INDEPENDENT_AMBULATORY_CARE_PROVIDER_SITE_OTHER): Payer: Medicare Other | Admitting: *Deleted

## 2012-01-27 ENCOUNTER — Encounter: Payer: Self-pay | Admitting: Cardiology

## 2012-01-27 VITALS — BP 118/80 | HR 72 | Ht 68.0 in | Wt 179.0 lb

## 2012-01-27 DIAGNOSIS — Z7901 Long term (current) use of anticoagulants: Secondary | ICD-10-CM

## 2012-01-27 DIAGNOSIS — I255 Ischemic cardiomyopathy: Secondary | ICD-10-CM

## 2012-01-27 DIAGNOSIS — N62 Hypertrophy of breast: Secondary | ICD-10-CM

## 2012-01-27 DIAGNOSIS — I2589 Other forms of chronic ischemic heart disease: Secondary | ICD-10-CM

## 2012-01-27 DIAGNOSIS — I2510 Atherosclerotic heart disease of native coronary artery without angina pectoris: Secondary | ICD-10-CM

## 2012-01-27 LAB — POCT INR: INR: 2.1

## 2012-01-27 NOTE — Assessment & Plan Note (Addendum)
The patient is on Inspra one half tablet daily for his ischemic cardiomyopathy.  He is still having some painful gynecomastia but it has improved when compared to the previous problem with spironolactone

## 2012-01-27 NOTE — Progress Notes (Signed)
[name] Date of Birth:  Mar 05, 1941 [facility] [address] [place], [place]  [address] [phone]         Fax   [phone]  History of Present Illness: This pleasant 71 year old gentleman is seen after a long absence.  He has a history of a remote anterior wall myocardial infarction.  He has an ischemic cardiomyopathy.  He has had a defibrillator for ventricular tachycardia.  He has been followed closely by Dr. Graciela Husbands and Dr. Johney Frame.  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  Subsequently he had recurrent ventricular tachycardia and was transferred to [facility] for incessant VT and underwent a second ablation procedure complicated by pericardial perforation and pericarditis.  He underwent a third catheter VT ablation procedure in March 2013 by Dr. Johney Frame and since then has felt well.  He has not been aware of any palpitations.  He has not been aware of any shocks from his ICD.  He does have a known ischemic cardiomyopathy with ejection fraction of 25-30% by echocardiogram in February 2012  Current Outpatient Prescriptions  Medication Sig Dispense Refill  . ALPRAZolam (XANAX) 0.25 MG tablet Take 0.25 mg by mouth 4 (four) times daily as needed. For anxiety       . Ascorbic Acid (VITAMIN C) 500 MG tablet Take 500 mg by mouth daily.        . carvedilol (COREG) 12.5 MG tablet Take one tablet by mouth twice daily      . eplerenone (INSPRA) 25 MG tablet Take 1/2 tablet by mouth once daily  90 tablet  2  . levothyroxine (SYNTHROID, LEVOTHROID) 88 MCG tablet Take 88 mcg by mouth daily.       . Loratadine (CLARITIN) 10 MG CAPS Take 1 capsule by mouth daily.       [name] losartan (COZAAR) 50 MG tablet Take 0.5 tablets (25 mg total) by mouth 2 (two) times daily.  90 tablet  3  . meclizine (ANTIVERT) 25 MG tablet Take 25 mg by mouth 3 (three) times daily.       [name] mexiletine (MEXITIL) 150 MG capsule TAKE 1 CAPSULE THREE TIMES A DAY  270  capsule  4  . Multiple Vitamin (MULITIVITAMIN WITH MINERALS) TABS Take 1 tablet by mouth daily.      . Omega-3 Fatty Acids (FISH OIL) 1000 MG CAPS Take 1,000 mg by mouth 2 (two) times daily.       . potassium chloride SA (K-DUR,KLOR-CON) 20 MEQ tablet Take 20 mEq by mouth 3 (three) times a week. Monday, Wednesday, friday      . rosuvastatin (CRESTOR) 5 MG tablet Take 5 mg by mouth every other day.        . sotalol (BETAPACE) 80 MG tablet Take 160 mg by mouth 2 (two) times daily. Take with 2 tablets twice daily      . warfarin (COUMADIN) 2.5 MG tablet Take as directed by Anticoagulation clinic.  90 tablet  1    Allergies  Allergen Reactions  . Iohexol      Code: HIVES, Desc: hives w/ itching during cardiac cath '99, mult caths since w/ ? premeds, DR.G.HAYES REQUESTS 13 HR PRE MED//A.C.pt okay w/13 hr prep/mms, Onset Date: [id number]   . Prednisone Other (See Comments)    unknown    Patient Active Problem List  Diagnosis  . THYROTOXICOSIS  . ABDOMINAL AORTIC ANEURYSM  . Cardiomyopathy, ischemic  . Chronic systolic heart failure  .  Ventricular tachycardia  . LBBB  . Chronic anticoagulation  . ICD (implantable cardiac defibrillator), biventricular,[employer]  . Sinus bradycardia  . HTN (hypertension)  . Coronary artery disease  . Anxiety  . Gynecomastia    History  Smoking status  . Former Smoker  Smokeless tobacco  . Former Neurosurgeon  . Quit date: 06/01/1987    History  Alcohol Use  . Yes    very rare    Family History  Problem Relation Age of Onset  . Stroke Mother   . Heart attack Father   . Heart attack Mother     Review of Systems: Constitutional: no fever chills diaphoresis or fatigue or change in weight.  Head and neck: no hearing loss, no epistaxis, no photophobia or visual disturbance. Respiratory: No cough, shortness of breath or wheezing. Cardiovascular: No chest pain peripheral edema, palpitations. Gastrointestinal: No abdominal distention, no abdominal  pain, no change in bowel habits hematochezia or melena. Genitourinary: No dysuria, no frequency, no urgency, no nocturia. Musculoskeletal:No arthralgias, no back pain, no gait disturbance or myalgias. Neurological: No dizziness, no headaches, no numbness, no seizures, no syncope, no weakness, no tremors. Hematologic: No lymphadenopathy, no easy bruising. Psychiatric: No confusion, no hallucinations, no sleep disturbance.    Physical Exam: Filed Vitals:   01/27/12 1027  BP: 118/80  Pulse: 72   the general appearance reveals a well-developed well-nourished alert gentleman in no distress.The head and neck exam reveals pupils equal and reactive.  Extraocular movements are full.  There is no scleral icterus.  The mouth and pharynx are normal.  The neck is supple.  The carotids reveal no bruits.  The jugular venous pressure is normal.  The  thyroid is not enlarged.  There is no lymphadenopathy.  The chest is clear to percussion and auscultation.  There are no rales or rhonchi.  Expansion of the chest is symmetrical.  The precordium is quiet.  The first heart sound is normal.  The second heart sound is physiologically split.  There is no murmur gallop rub or click.  There is no abnormal lift or heave.  The abdomen is soft and nontender.  The bowel sounds are normal.  The liver and spleen are not enlarged.  There are no abdominal masses.  There are no abdominal bruits.  Extremities reveal good pedal pulses.  There is no phlebitis or edema.  There is no cyanosis or clubbing.  Strength is normal and symmetrical in all extremities.  There is no lateralizing weakness.  There are no sensory deficits.  The skin is warm and dry.  There is no rash.  EKG today shows AV paced rhythm at 72 per minute and no PVCs   Assessment / Plan: Continue same medication.  Recheck in 3 months for followup office visit.  He is gradually regaining his strength since his pericarditis and his walking is now up to 45 minutes a  day

## 2012-01-27 NOTE — Assessment & Plan Note (Signed)
The patient has not been experiencing any angina pectoris.  He remains on Crestor, Coreg, and Cozaar

## 2012-01-27 NOTE — Assessment & Plan Note (Signed)
The patient is on long-term Coumadin for his ischemic cardiomyopathy.  He is remaining in a therapeutic range.  He's not having any bruising or bleeding from the Coumadin

## 2012-01-27 NOTE — Patient Instructions (Addendum)
Your physician recommends that you continue on your current medications as directed. Please refer to the Current Medication list given to you today.  Your physician recommends that you schedule a follow-up appointment in: 3 months  

## 2012-02-03 ENCOUNTER — Encounter: Payer: Self-pay | Admitting: Internal Medicine

## 2012-02-03 ENCOUNTER — Ambulatory Visit (INDEPENDENT_AMBULATORY_CARE_PROVIDER_SITE_OTHER): Payer: Medicare Other | Admitting: *Deleted

## 2012-02-03 DIAGNOSIS — I5022 Chronic systolic (congestive) heart failure: Secondary | ICD-10-CM

## 2012-02-03 DIAGNOSIS — I2589 Other forms of chronic ischemic heart disease: Secondary | ICD-10-CM

## 2012-02-03 DIAGNOSIS — I255 Ischemic cardiomyopathy: Secondary | ICD-10-CM

## 2012-02-04 LAB — REMOTE ICD DEVICE
AL AMPLITUDE: 2.5 mv
AL IMPEDENCE ICD: 430 Ohm
BAMS-0001: 170 {beats}/min
HV IMPEDENCE: 47 Ohm
LV LEAD IMPEDENCE ICD: 910 Ohm
RV LEAD AMPLITUDE: 11.8 mv
VENTRICULAR PACING ICD: 100 pct

## 2012-02-09 ENCOUNTER — Encounter: Payer: Self-pay | Admitting: *Deleted

## 2012-02-24 ENCOUNTER — Ambulatory Visit (INDEPENDENT_AMBULATORY_CARE_PROVIDER_SITE_OTHER): Payer: Medicare Other | Admitting: *Deleted

## 2012-02-24 DIAGNOSIS — I2589 Other forms of chronic ischemic heart disease: Secondary | ICD-10-CM

## 2012-02-24 DIAGNOSIS — Z7901 Long term (current) use of anticoagulants: Secondary | ICD-10-CM

## 2012-02-24 DIAGNOSIS — I255 Ischemic cardiomyopathy: Secondary | ICD-10-CM

## 2012-02-24 LAB — POCT INR: INR: 2.2

## 2012-03-14 ENCOUNTER — Other Ambulatory Visit: Payer: Self-pay | Admitting: Internal Medicine

## 2012-03-23 ENCOUNTER — Ambulatory Visit (INDEPENDENT_AMBULATORY_CARE_PROVIDER_SITE_OTHER): Payer: Medicare Other | Admitting: Pharmacist

## 2012-03-23 DIAGNOSIS — I255 Ischemic cardiomyopathy: Secondary | ICD-10-CM

## 2012-03-23 DIAGNOSIS — I2589 Other forms of chronic ischemic heart disease: Secondary | ICD-10-CM

## 2012-03-23 DIAGNOSIS — Z7901 Long term (current) use of anticoagulants: Secondary | ICD-10-CM

## 2012-03-23 LAB — POCT INR: INR: 2.2

## 2012-04-13 ENCOUNTER — Encounter: Payer: Self-pay | Admitting: Cardiology

## 2012-04-13 ENCOUNTER — Ambulatory Visit (INDEPENDENT_AMBULATORY_CARE_PROVIDER_SITE_OTHER): Payer: Medicare Other | Admitting: Cardiology

## 2012-04-13 VITALS — BP 130/64 | HR 71 | Resp 18 | Ht 68.0 in | Wt 183.0 lb

## 2012-04-13 DIAGNOSIS — I2510 Atherosclerotic heart disease of native coronary artery without angina pectoris: Secondary | ICD-10-CM

## 2012-04-13 DIAGNOSIS — I259 Chronic ischemic heart disease, unspecified: Secondary | ICD-10-CM

## 2012-04-13 DIAGNOSIS — I255 Ischemic cardiomyopathy: Secondary | ICD-10-CM

## 2012-04-13 DIAGNOSIS — E7800 Pure hypercholesterolemia, unspecified: Secondary | ICD-10-CM

## 2012-04-13 DIAGNOSIS — I2589 Other forms of chronic ischemic heart disease: Secondary | ICD-10-CM

## 2012-04-13 DIAGNOSIS — Z7901 Long term (current) use of anticoagulants: Secondary | ICD-10-CM

## 2012-04-13 MED ORDER — ALPRAZOLAM 0.25 MG PO TABS: 0.2500 mg | ORAL_TABLET | Freq: Four times a day (QID) | ORAL | Status: DC | PRN

## 2012-04-13 NOTE — Progress Notes (Signed)
[name] Date of Birth:  1941-01-26 [facility] [address] [place], [place]  [address] [phone]         Fax   [phone]  History of Present Illness: This pleasant 71 year old gentleman is seen for a three-month followup office visit. He has a history of a remote anterior wall myocardial infarction. He has an ischemic cardiomyopathy. He has had a defibrillator for ventricular tachycardia. He has been followed closely by Dr. Graciela Husbands and Dr. Johney Frame. 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 Subsequently he had recurrent ventricular tachycardia and was transferred to [facility] for incessant VT and underwent a second ablation procedure complicated by pericardial perforation and pericarditis. He underwent a third catheter VT ablation procedure in March 2013 by Dr. Johney Frame and since then has felt well. He has not been aware of any palpitations. He has not been aware of any shocks from his ICD. He does have a known ischemic cardiomyopathy with ejection fraction of 25-30% by echocardiogram in February 2012   Current Outpatient Prescriptions  Medication Sig Dispense Refill  . ALPRAZolam (XANAX) 0.25 MG tablet Take 0.25 mg by mouth 4 (four) times daily as needed. For anxiety       . Ascorbic Acid (VITAMIN C) 500 MG tablet Take 500 mg by mouth daily.        . carvedilol (COREG) 12.5 MG tablet Take one tablet by mouth twice daily      . eplerenone (INSPRA) 25 MG tablet Take 1/2 tablet by mouth once daily  90 tablet  2  . levothyroxine (SYNTHROID, LEVOTHROID) 88 MCG tablet Take 88 mcg by mouth daily.       . Loratadine (CLARITIN) 10 MG CAPS Take 1 capsule by mouth daily.       [name] losartan (COZAAR) 50 MG tablet Take 0.5 tablets (25 mg total) by mouth 2 (two) times daily.  90 tablet  3  . meclizine (ANTIVERT) 25 MG tablet Take 25 mg by mouth 3 (three) times daily.       [name] mexiletine (MEXITIL) 150 MG capsule TAKE 1 CAPSULE THREE TIMES A DAY   270 capsule  4  . Multiple Vitamin (MULITIVITAMIN WITH MINERALS) TABS Take 1 tablet by mouth daily.      . Omega-3 Fatty Acids (FISH OIL) 1000 MG CAPS Take 1,000 mg by mouth 2 (two) times daily.       . potassium chloride SA (K-DUR,KLOR-CON) 20 MEQ tablet Take 20 mEq by mouth 3 (three) times a week. Monday, Wednesday, friday      . rosuvastatin (CRESTOR) 5 MG tablet Take 5 mg by mouth every other day.        . sotalol (BETAPACE) 80 MG tablet Take 160 mg by mouth 2 (two) times daily. Take with 2 tablets twice daily      . warfarin (COUMADIN) 2.5 MG tablet Take as directed by Anticoagulation clinic.  90 tablet  1  . losartan (COZAAR) 50 MG tablet TAKE 1 TABLET DAILY  90 tablet  2    Allergies  Allergen Reactions  . Iohexol      Code: HIVES, Desc: hives w/ itching during cardiac cath '99, mult caths since w/ ? premeds, DR.G.HAYES REQUESTS 13 HR PRE MED//A.C.pt okay w/13 hr prep/mms, Onset Date: [id number]   . Prednisone Other (See Comments)    unknown    Patient Active Problem List  Diagnosis  . THYROTOXICOSIS  . ABDOMINAL AORTIC ANEURYSM  . Cardiomyopathy,  ischemic  . Chronic systolic heart failure  . Ventricular tachycardia  . LBBB  . Chronic anticoagulation  . ICD (implantable cardiac defibrillator), biventricular,[employer]  . Sinus bradycardia  . HTN (hypertension)  . Coronary artery disease  . Anxiety  . Gynecomastia    History  Smoking status  . Former Smoker  Smokeless tobacco  . Former Neurosurgeon  . Quit date: 06/01/1987    History  Alcohol Use  . Yes    Comment: very rare    Family History  Problem Relation Age of Onset  . Stroke Mother   . Heart attack Father   . Heart attack Mother     Review of Systems: Constitutional: no fever chills diaphoresis or fatigue or change in weight.  Head and neck: no hearing loss, no epistaxis, no photophobia or visual disturbance. Respiratory: No cough, shortness of breath or wheezing. Cardiovascular: No chest pain  peripheral edema, palpitations. Gastrointestinal: No abdominal distention, no abdominal pain, no change in bowel habits hematochezia or melena. Genitourinary: No dysuria, no frequency, no urgency, no nocturia. Musculoskeletal:No arthralgias, no back pain, no gait disturbance or myalgias. Neurological: No dizziness, no headaches, no numbness, no seizures, no syncope, no weakness, no tremors. Hematologic: No lymphadenopathy, no easy bruising. Psychiatric: No confusion, no hallucinations, no sleep disturbance.    Physical Exam: Filed Vitals:   04/13/12 1125  BP: 130/64  Pulse: 71  Resp: 18   the general appearance reveals a well-developed well-nourished gentleman in no distress.  He has had some recent skin lesions removed from his scalp by his dermatologist Dr. Donzetta Starch.The head and neck exam reveals pupils equal and reactive.  Extraocular movements are full.  There is no scleral icterus.  The mouth and pharynx are normal.  The neck is supple.  The carotids reveal no bruits.  The jugular venous pressure is normal.  The  thyroid is not enlarged.  There is no lymphadenopathy.  The chest is clear to percussion and auscultation.  There are no rales or rhonchi.  Expansion of the chest is symmetrical.  There is mild gynecomastia .The precordium is quiet.  The first heart sound is normal.  The second heart sound is physiologically split.  There is no murmur gallop rub or click.  There is no abnormal lift or heave.  The abdomen is soft and nontender.  The bowel sounds are normal.  The liver and spleen are not enlarged.  There are no abdominal masses.  There are no abdominal bruits.  Extremities reveal good pedal pulses.  There is no phlebitis or edema.  There is no cyanosis or clubbing.  Strength is normal and symmetrical in all extremities.  There is no lateralizing weakness.  There are no sensory deficits.  The skin is warm and dry.  There is no rash.     Assessment / Plan: Continue same medication.   Recheck in 3 months for followup office visit and EKG

## 2012-04-13 NOTE — Assessment & Plan Note (Signed)
The patient has had no TIA or stroke symptoms.  He is on long-term Coumadin because of his dilated cardiomyopathy.

## 2012-04-13 NOTE — Assessment & Plan Note (Signed)
The patient has any recurrent chest pain or angina.  He remains on Crestor and is not having any myalgias

## 2012-04-13 NOTE — Patient Instructions (Addendum)
Your physician recommends that you continue on your current medications as directed. Please refer to the Current Medication list given to you today.  Your physician recommends that you schedule a follow-up appointment in: 3 month ov/ekg 

## 2012-04-13 NOTE — Assessment & Plan Note (Signed)
Since the last visit the patient has been doing well.  He has not been experiencing any chest pain or angina.  His gynecomastia which she developed on spironolactone has partially resolved since being switched to Inspra.  He is not having any orthopnea or paroxysmal nocturnal dyspnea

## 2012-04-14 ENCOUNTER — Other Ambulatory Visit: Payer: Self-pay | Admitting: *Deleted

## 2012-04-14 MED ORDER — ALPRAZOLAM 0.25 MG PO TABS: 0.2500 mg | ORAL_TABLET | Freq: Four times a day (QID) | ORAL | Status: DC | PRN

## 2012-04-14 NOTE — Telephone Encounter (Signed)
Refilled as requested  

## 2012-04-25 ENCOUNTER — Ambulatory Visit (INDEPENDENT_AMBULATORY_CARE_PROVIDER_SITE_OTHER): Payer: Medicare Other | Admitting: *Deleted

## 2012-04-25 ENCOUNTER — Encounter: Payer: Self-pay | Admitting: Internal Medicine

## 2012-04-25 ENCOUNTER — Ambulatory Visit (INDEPENDENT_AMBULATORY_CARE_PROVIDER_SITE_OTHER): Payer: Medicare Other | Admitting: Internal Medicine

## 2012-04-25 VITALS — BP 116/71 | HR 70 | Ht 68.0 in | Wt 183.0 lb

## 2012-04-25 DIAGNOSIS — I5022 Chronic systolic (congestive) heart failure: Secondary | ICD-10-CM

## 2012-04-25 DIAGNOSIS — R0989 Other specified symptoms and signs involving the circulatory and respiratory systems: Secondary | ICD-10-CM

## 2012-04-25 DIAGNOSIS — Z7901 Long term (current) use of anticoagulants: Secondary | ICD-10-CM

## 2012-04-25 DIAGNOSIS — I255 Ischemic cardiomyopathy: Secondary | ICD-10-CM

## 2012-04-25 DIAGNOSIS — I2589 Other forms of chronic ischemic heart disease: Secondary | ICD-10-CM

## 2012-04-25 DIAGNOSIS — Z95810 Presence of automatic (implantable) cardiac defibrillator: Secondary | ICD-10-CM

## 2012-04-25 DIAGNOSIS — I472 Ventricular tachycardia: Secondary | ICD-10-CM

## 2012-04-25 LAB — ICD DEVICE OBSERVATION
AL AMPLITUDE: 2.7 mv
AL IMPEDENCE ICD: 440 Ohm
AL THRESHOLD: 1.75 v
ATRIAL PACING ICD: 99 pct
BAMS-0001: 170 {beats}/min
DEV-0020ICD: NEGATIVE
DEVICE MODEL ICD: 828324
FVT: 0
HV IMPEDENCE: 47 Ohm
LV LEAD IMPEDENCE ICD: 940 Ohm
LV LEAD THRESHOLD: 0.75 v
PACEART VT: 0
RV LEAD AMPLITUDE: 11.8 mv
RV LEAD IMPEDENCE ICD: 340 Ohm
RV LEAD THRESHOLD: 1.25 v
TZON-0003SLOWVT: 570 ms
TZON-0004SLOWVT: 30
TZON-0005SLOWVT: 6
TZON-0010SLOWVT: 40 ms
VENTRICULAR PACING ICD: 99 pct
VF: 0

## 2012-04-25 LAB — POCT INR: INR: 2.2

## 2012-04-25 NOTE — Patient Instructions (Signed)
Your physician wants you to follow-up in: 1 year with Dr. Graciela Husbands. You will receive a reminder letter in the mail two months in advance. If you don't receive a letter, please call our office to schedule the follow-up appointment.  Remote monitoring is used to monitor your Pacemaker of ICD from home. This monitoring reduces the number of office visits required to check your device to one time per year. It allows [place] to keep an eye on the functioning of your device to ensure it is working properly. You are scheduled for a device check from home on 07/31/2012. You may send your transmission at any time that day. If you have a wireless device, the transmission will be sent automatically. After your physician reviews your transmission, you will receive a postcard with your next transmission date.

## 2012-04-25 NOTE — Assessment & Plan Note (Signed)
No intercurrent Ventricular tachycardia he would like to continue on his current medication regime. As it has been successful he would like not to change anything

## 2012-04-25 NOTE — Assessment & Plan Note (Signed)
The patient's device was interrogated.  The information was reviewed. No changes were made in the programming.    

## 2012-04-25 NOTE — Assessment & Plan Note (Signed)
As above.

## 2012-04-25 NOTE — Progress Notes (Signed)
Patient Care Team: [name] Hy, MD as PCP - General (Endocrinology)   HPI  [name] is a 71 y.o. male Is seen in followup for recurrent ventricular tachycardia. He has been on multiple medications in the past, and underwent catheter ablation in the setting of VT storm September 2012. He had post ablation ventricular tachycardia and was started on amiodarone which had previously not been used because of concurrent hypothyroidism.  Because of recurrent ventricular tachycardia he was transferred to [facility] for incessant VT and underwent an ablation complicated by pericardial perforation and subsequent pericarditis.  He was seen a couple of weeks after the last visit in the emergency room with frequent ventricular ectopy almost in a pattern of accelerated idioventricular rhythm and we put him on mexiletine. This has been associated with a decrease in his ventricular burden from 38% to 6% and a marked improvement in his overall symptom complex.  His energy is improving.  He then had recurrent ventricular tachycardia , which was relatively slow at 110-130 beats per minute and underwent repeat catheter ablation in March 2103  by Dr. Johney Frame    Ischemic cardiomyopathy with ejection fraction of 25-30% by echo in December 2000 well with multiple wall motion abnormalities ejection fraction 30%  The patient denies chest pain, shortness of breath, nocturnal dyspnea, orthopnea or peripheral edema.  There have been no palpitations, lightheadedness or syncope.     Past Medical History  Diagnosis Date  . CAD (coronary artery disease) 1999    Remote anterolater and apical MI, cath 2011 patent stents RCA/LCx  LAD w/o obstruction  . Cardiomyopathy, ischemic     EF 20-25%  echo 2012  . Chronic systolic congestive heart failure   . ICD (implantable cardiac defibrillator) in place     [name]  . 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 Abdominal aortic aneurysm 2009    Repaired with endovascular  stent  . PVD (peripheral vascular disease)     Has occluded innominate vein  . Myocardial infarction   . Hypertension   . Ventricular tachycardia     recurrent slow VT at 100-110 bpm,  Rx mexilitene/sotalol  . Left bundle branch block   . Systolic CHF, chronic   . Arthritis   . Gynecomastia     2/2 spironolactone    Past Surgical History  Procedure Date  . Icd Feb 2012    Change out with LV lead placed with tunneling from the right  to the left side  . Endovascular stent insertion     for AAA  . Cardiac catheterization     x2  . Pacemaker insertion   . Ventricular ablation surgery     s/p prior EPS and ablation at [facility] 9/12, [facility] 11/12, and most recent at [facility] 08/23/11  . Tonsillectomy     Current Outpatient Prescriptions  Medication Sig Dispense Refill  . ALPRAZolam (XANAX) 0.25 MG tablet Take 1 tablet (0.25 mg total) by mouth 4 (four) times daily as needed. For anxiety  360 tablet  1  . Ascorbic Acid (VITAMIN C) 500 MG tablet Take 500 mg by mouth daily.        . carvedilol (COREG) 12.5 MG tablet Take one tablet by mouth twice daily      . eplerenone (INSPRA) 25 MG tablet Take 1/2 tablet by mouth once daily  90 tablet  2  . levothyroxine (SYNTHROID, LEVOTHROID) 88 MCG tablet Take 88 mcg by mouth daily.       [name]  Loratadine (CLARITIN) 10 MG CAPS Take 1 capsule by mouth daily.       [name] losartan (COZAAR) 50 MG tablet Take 0.5 tablets (25 mg total) by mouth 2 (two) times daily.  90 tablet  3  . losartan (COZAAR) 50 MG tablet TAKE 1 TABLET DAILY  90 tablet  2  . meclizine (ANTIVERT) 25 MG tablet Take 25 mg by mouth 3 (three) times daily.       [name] mexiletine (MEXITIL) 150 MG capsule TAKE 1 CAPSULE THREE TIMES A DAY  270 capsule  4  . Multiple Vitamin (MULITIVITAMIN WITH MINERALS) TABS Take 1 tablet by mouth daily.      . Omega-3 Fatty Acids (FISH OIL) 1000 MG CAPS Take 1,000 mg by mouth 2 (two) times daily.       . potassium chloride SA (K-DUR,KLOR-CON) 20 MEQ tablet Take  20 mEq by mouth 3 (three) times a week. Monday, Wednesday, friday      . rosuvastatin (CRESTOR) 5 MG tablet Take 5 mg by mouth every other day.        . sotalol (BETAPACE) 80 MG tablet Take 160 mg by mouth 2 (two) times daily. Take with 2 tablets twice daily      . warfarin (COUMADIN) 2.5 MG tablet Take as directed by Anticoagulation clinic.  90 tablet  1    Allergies  Allergen Reactions  . Iohexol      Code: HIVES, Desc: hives w/ itching during cardiac cath '99, mult caths since w/ ? premeds, DR.G.HAYES REQUESTS 13 HR PRE MED//A.C.pt okay w/13 hr prep/mms, Onset Date: [id number]   . Prednisone Other (See Comments)    unknown    Review of Systems negative except from HPI and PMH  Physical Exam BP 116/71  Pulse 70  Ht 5\' 8"  (1.727 m)  Wt 183 lb (83.008 kg)  BMI 27.82 kg/m2 Well developed and well nourished in no acute distress HENT normal E scleral and icterus clear Neck Supple JVP flat; carotids brisk and full Clear to ausculation  Regular rate and rhythm, no murmurs gallops or rub Soft with active bowel sounds No clubbing cyanosis no Edema Alert and oriented, grossly normal motor and sensory function Skin Warm and Dry    Assessment and  Plan

## 2012-04-25 NOTE — Assessment & Plan Note (Signed)
Patient's class heart failure is currently class II continue current medications. He needs serial potassium checks on his aldosterone antagonist therapy. This should  be done every 3 months

## 2012-05-19 ENCOUNTER — Telehealth: Payer: Self-pay | Admitting: Cardiology

## 2012-05-19 ENCOUNTER — Other Ambulatory Visit: Payer: Self-pay | Admitting: Cardiology

## 2012-05-19 MED ORDER — SOTALOL HCL 80 MG PO TABS: 160.0000 mg | ORAL_TABLET | Freq: Two times a day (BID) | ORAL | Status: DC

## 2012-05-19 NOTE — Telephone Encounter (Signed)
New Problem:    Called in needing a refill of the patient's sotalol (BETAPACE) 80 MG tablet.

## 2012-05-22 ENCOUNTER — Other Ambulatory Visit: Payer: Self-pay | Admitting: *Deleted

## 2012-05-25 ENCOUNTER — Other Ambulatory Visit: Payer: Self-pay

## 2012-05-25 MED ORDER — SOTALOL HCL 80 MG PO TABS: 160.0000 mg | ORAL_TABLET | Freq: Two times a day (BID) | ORAL | Status: DC

## 2012-05-26 ENCOUNTER — Telehealth: Payer: Self-pay

## 2012-05-26 ENCOUNTER — Telehealth: Payer: Self-pay | Admitting: Cardiology

## 2012-05-26 ENCOUNTER — Other Ambulatory Visit: Payer: Self-pay

## 2012-05-26 MED ORDER — SOTALOL HCL 80 MG PO TABS: 160.0000 mg | ORAL_TABLET | Freq: Two times a day (BID) | ORAL | Status: DC

## 2012-05-26 NOTE — Telephone Encounter (Signed)
Walk in patient form requesting to restart medications. Dr. Patty Sermons is out of the office.  The form was taken to Triage. A copy was retained for KM...djc

## 2012-05-26 NOTE — Telephone Encounter (Signed)
[name] wants to know if he can cut his sotalol from 160 mg bid to 120 mg bid?  He feels the dose he is on is too high and he only wants to take one pill bid instead of two bid.  He denies any adverse reactions to the sotalol.

## 2012-05-28 NOTE — Telephone Encounter (Signed)
Okay to try the lower dose of sotolol 120 mg BID.  If he starts to have more arrhythmias we can increase him back to the former dose.

## 2012-05-29 NOTE — Telephone Encounter (Signed)
Advised patient. Patient just received Rx for Sotalol 80 mg, will take 1 & 1/2 twice a day until finished.  Will call back when needs refilled

## 2012-06-06 ENCOUNTER — Ambulatory Visit (INDEPENDENT_AMBULATORY_CARE_PROVIDER_SITE_OTHER): Payer: Medicare Other

## 2012-06-06 DIAGNOSIS — I255 Ischemic cardiomyopathy: Secondary | ICD-10-CM

## 2012-06-06 DIAGNOSIS — Z7901 Long term (current) use of anticoagulants: Secondary | ICD-10-CM

## 2012-06-06 DIAGNOSIS — I2589 Other forms of chronic ischemic heart disease: Secondary | ICD-10-CM

## 2012-06-06 LAB — POCT INR: INR: 2.2

## 2012-06-08 ENCOUNTER — Encounter: Payer: Self-pay | Admitting: Internal Medicine

## 2012-06-16 ENCOUNTER — Other Ambulatory Visit: Payer: Self-pay | Admitting: *Deleted

## 2012-06-16 MED ORDER — MECLIZINE HCL 25 MG PO TABS: 25.0000 mg | ORAL_TABLET | Freq: Three times a day (TID) | ORAL | Status: DC

## 2012-07-19 ENCOUNTER — Ambulatory Visit (INDEPENDENT_AMBULATORY_CARE_PROVIDER_SITE_OTHER): Payer: Medicare Other | Admitting: Cardiology

## 2012-07-19 ENCOUNTER — Ambulatory Visit (INDEPENDENT_AMBULATORY_CARE_PROVIDER_SITE_OTHER): Payer: Medicare Other | Admitting: *Deleted

## 2012-07-19 ENCOUNTER — Encounter: Payer: Self-pay | Admitting: Cardiology

## 2012-07-19 VITALS — BP 118/76 | HR 76 | Ht 68.0 in | Wt 183.4 lb

## 2012-07-19 DIAGNOSIS — Z7901 Long term (current) use of anticoagulants: Secondary | ICD-10-CM

## 2012-07-19 DIAGNOSIS — I2589 Other forms of chronic ischemic heart disease: Secondary | ICD-10-CM

## 2012-07-19 LAB — POCT INR: INR: 2.2

## 2012-07-19 MED ORDER — ROSUVASTATIN CALCIUM 5 MG PO TABS: ORAL_TABLET | ORAL | Status: DC

## 2012-07-19 NOTE — Assessment & Plan Note (Signed)
The patient has not recurrent chest pain or angina.  He walks for exercise at least 5 days a week.  He has had some feeling of weakness of the right leg and he thinks that it may be due to the fact that he has had 8 cardiac catheterizations done through his right groin.

## 2012-07-19 NOTE — Assessment & Plan Note (Signed)
The patient has not had any problems from his chronic Coumadin anticoagulation for his ischemic cardiomyopathy.

## 2012-07-19 NOTE — Assessment & Plan Note (Signed)
The patient is not having any orthopnea or paroxysmal nocturnal dyspnea or significant peripheral edema.

## 2012-07-19 NOTE — Patient Instructions (Addendum)
Your physician recommends that you continue on your current medications as directed. Please refer to the Current Medication list given to you today.  Your physician recommends that you schedule a follow-up appointment in: 4 month ov/ekg 

## 2012-07-19 NOTE — Progress Notes (Signed)
[name] Date of Birth:  1940-09-01 [facility] [address] [place], [place]  [address] [phone]         Fax   [phone]  History of Present Illness: This pleasant 72 year old gentleman is seen for a three-month followup office visit. He has a history of a remote anterior wall myocardial infarction. He has an ischemic cardiomyopathy. He has had a defibrillator for ventricular tachycardia. He has been followed closely by Dr. Graciela Husbands and Dr. Johney Frame. 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 Subsequently he had recurrent ventricular tachycardia and was transferred to [facility] for incessant VT and underwent a second ablation procedure complicated by pericardial perforation and pericarditis. He underwent a third catheter VT ablation procedure in March 2013 by Dr. Johney Frame and since then has felt well. He has not been aware of any palpitations. He has not been aware of any shocks from his ICD. He does have a known ischemic cardiomyopathy with ejection fraction of 25-30% by echocardiogram in February 2012.  Since last visit he has had no new cardiac symptoms.   Current Outpatient Prescriptions  Medication Sig Dispense Refill  . ALPRAZolam (XANAX) 0.25 MG tablet Take 1 tablet (0.25 mg total) by mouth 4 (four) times daily as needed. For anxiety  360 tablet  1  . Ascorbic Acid (VITAMIN C) 500 MG tablet Take 500 mg by mouth daily.        . carvedilol (COREG) 12.5 MG tablet Take one tablet by mouth twice daily      . eplerenone (INSPRA) 25 MG tablet Take 1/2 tablet by mouth once daily  90 tablet  2  . levothyroxine (SYNTHROID, LEVOTHROID) 88 MCG tablet Take 88 mcg by mouth daily.       . Loratadine (CLARITIN) 10 MG CAPS Take 1 capsule by mouth daily.       [name] losartan (COZAAR) 50 MG tablet Take 0.5 tablets (25 mg total) by mouth 2 (two) times daily.  90 tablet  3  . meclizine (ANTIVERT) 25 MG tablet Take 1 tablet (25 mg total) by mouth 3  (three) times daily.  270 tablet  1  . mexiletine (MEXITIL) 150 MG capsule TAKE 1 CAPSULE THREE TIMES A DAY  270 capsule  4  . Multiple Vitamin (MULITIVITAMIN WITH MINERALS) TABS Take 1 tablet by mouth daily.      . Omega-3 Fatty Acids (FISH OIL) 1000 MG CAPS Take 1,000 mg by mouth 2 (two) times daily.       . potassium chloride SA (K-DUR,KLOR-CON) 20 MEQ tablet Take 20 mEq by mouth 3 (three) times a week. Monday, Wednesday, friday      . rosuvastatin (CRESTOR) 5 MG tablet Take 1 tablet every other day or as directed  90 tablet  3  . sotalol (BETAPACE) 80 MG tablet Take 160 mg by mouth 2 (two) times daily.      [name] warfarin (COUMADIN) 2.5 MG tablet Take as directed by Anticoagulation clinic.  90 tablet  1   No current facility-administered medications for this visit.    Allergies  Allergen Reactions  . Iohexol      Code: HIVES, Desc: hives w/ itching during cardiac cath '99, mult caths since w/ ? premeds, DR.G.HAYES REQUESTS 13 HR PRE MED//A.C.pt okay w/13 hr prep/mms, Onset Date: [id number]   . Prednisone Other (See Comments)    unknown    Patient Active Problem List  Diagnosis  . THYROTOXICOSIS  . ABDOMINAL AORTIC  ANEURYSM  . Cardiomyopathy, ischemic  . Chronic systolic heart failure  . Ventricular tachycardia  . LBBB  . Chronic anticoagulation  . ICD (implantable cardiac defibrillator), biventricular,[place]  . Sinus bradycardia  . HTN (hypertension)  . Coronary artery disease  . Anxiety    History  Smoking status  . Former Smoker  Smokeless tobacco  . Former Neurosurgeon  . Quit date: 06/01/1987    History  Alcohol Use  . Yes    Comment: very rare    Family History  Problem Relation Age of Onset  . Stroke Mother   . Heart attack Father   . Heart attack Mother     Review of Systems: Constitutional: no fever chills diaphoresis or fatigue or change in weight.  Head and neck: no hearing loss, no epistaxis, no photophobia or visual disturbance. Respiratory: No cough,  shortness of breath or wheezing. Cardiovascular: No chest pain peripheral edema, palpitations. Gastrointestinal: No abdominal distention, no abdominal pain, no change in bowel habits hematochezia or melena. Genitourinary: No dysuria, no frequency, no urgency, no nocturia. Musculoskeletal:No arthralgias, no back pain, no gait disturbance or myalgias. Neurological: No dizziness, no headaches, no numbness, no seizures, no syncope, no weakness, no tremors. Hematologic: No lymphadenopathy, no easy bruising. Psychiatric: No confusion, no hallucinations, no sleep disturbance.    Physical Exam: Filed Vitals:   07/19/12 0949  BP: 118/76  Pulse: 76   the general appearance reveals a well-developed well-nourished alert gentleman in no distress.  He is clinically euthyroid.The head and neck exam reveals pupils equal and reactive.  Extraocular movements are full.  There is no scleral icterus.  The mouth and pharynx are normal.  The neck is supple.  The carotids reveal no bruits.  The jugular venous pressure is normal.  The  thyroid is not enlarged.  There is no lymphadenopathy.  The chest is clear to percussion and auscultation.  There are no rales or rhonchi.  Expansion of the chest is symmetrical.  The precordium is quiet.  The first heart sound is normal.  The second heart sound is physiologically split.  There is no murmur gallop rub or click.  There is no abnormal lift or heave.  The abdomen is soft and nontender.  The bowel sounds are normal.  The liver and spleen are not enlarged.  There are no abdominal masses.  There are no abdominal bruits.  Extremities reveal good pedal pulses.  There is no phlebitis or edema.  There is no cyanosis or clubbing.  Strength is normal and symmetrical in all extremities.  There is no lateralizing weakness.  There are no sensory deficits.  The skin is warm and dry.  There is no rash.     Assessment / Plan:  Continue same medication.  He will be getting full lab work  with Dr. Evlyn Kanner in April 2014.  Recheck here in 4 months for followup office visit and EKG.

## 2012-07-31 ENCOUNTER — Other Ambulatory Visit: Payer: Self-pay

## 2012-07-31 ENCOUNTER — Ambulatory Visit (INDEPENDENT_AMBULATORY_CARE_PROVIDER_SITE_OTHER): Payer: Medicare Other | Admitting: *Deleted

## 2012-07-31 ENCOUNTER — Encounter: Payer: Self-pay | Admitting: Internal Medicine

## 2012-07-31 DIAGNOSIS — I5022 Chronic systolic (congestive) heart failure: Secondary | ICD-10-CM

## 2012-07-31 DIAGNOSIS — I255 Ischemic cardiomyopathy: Secondary | ICD-10-CM

## 2012-07-31 DIAGNOSIS — Z95810 Presence of automatic (implantable) cardiac defibrillator: Secondary | ICD-10-CM

## 2012-08-03 LAB — REMOTE ICD DEVICE
AL AMPLITUDE: 2.8 mv
ATRIAL PACING ICD: 99 pct
DEV-0020ICD: NEGATIVE
DEVICE MODEL ICD: 828324
HV IMPEDENCE: 50 Ohm
TZON-0004SLOWVT: 30
TZON-0005SLOWVT: 6
TZON-0010SLOWVT: 40 ms
VENTRICULAR PACING ICD: 99 pct

## 2012-08-11 ENCOUNTER — Encounter: Payer: Self-pay | Admitting: *Deleted

## 2012-08-23 ENCOUNTER — Other Ambulatory Visit: Payer: Self-pay | Admitting: Cardiology

## 2012-08-23 MED ORDER — WARFARIN SODIUM 2.5 MG PO TABS: ORAL_TABLET | ORAL | Status: DC

## 2012-08-29 ENCOUNTER — Ambulatory Visit (INDEPENDENT_AMBULATORY_CARE_PROVIDER_SITE_OTHER): Payer: Medicare Other | Admitting: *Deleted

## 2012-08-29 DIAGNOSIS — Z7901 Long term (current) use of anticoagulants: Secondary | ICD-10-CM

## 2012-08-29 DIAGNOSIS — I2589 Other forms of chronic ischemic heart disease: Secondary | ICD-10-CM

## 2012-08-29 DIAGNOSIS — I255 Ischemic cardiomyopathy: Secondary | ICD-10-CM

## 2012-08-29 LAB — POCT INR: INR: 2.2

## 2012-09-12 ENCOUNTER — Encounter: Payer: Self-pay | Admitting: Internal Medicine

## 2012-10-02 ENCOUNTER — Telehealth: Payer: Self-pay | Admitting: Cardiology

## 2012-10-02 NOTE — Telephone Encounter (Signed)
Walk In Pt Form " Pt Left Letter For Juliette Alcide" gave to [facility]  10/02/12/KM

## 2012-10-04 ENCOUNTER — Telehealth: Payer: Self-pay | Admitting: Cardiology

## 2012-10-04 NOTE — Telephone Encounter (Signed)
noted 

## 2012-10-04 NOTE — Telephone Encounter (Signed)
New Prob      Pt spoke with [facility] and they are processing prescription he discussed with nurse earlier this week.

## 2012-10-10 ENCOUNTER — Ambulatory Visit (INDEPENDENT_AMBULATORY_CARE_PROVIDER_SITE_OTHER): Payer: Medicare Other | Admitting: *Deleted

## 2012-10-10 DIAGNOSIS — I255 Ischemic cardiomyopathy: Secondary | ICD-10-CM

## 2012-10-10 DIAGNOSIS — I2589 Other forms of chronic ischemic heart disease: Secondary | ICD-10-CM

## 2012-10-10 DIAGNOSIS — Z7901 Long term (current) use of anticoagulants: Secondary | ICD-10-CM

## 2012-10-12 ENCOUNTER — Telehealth: Payer: Self-pay | Admitting: *Deleted

## 2012-10-12 NOTE — Telephone Encounter (Signed)
Patient was in and spoke with Daphene Calamity CMA and stated he had been seen at [facility] recently and med changes. Since change he has had shortness of breath going uphill and wanted to go back on previous dose. Unsure of medication that was changed. Unable to leave message at home number. Will try again tomorrow

## 2012-10-13 ENCOUNTER — Telehealth: Payer: Self-pay | Admitting: Cardiology

## 2012-10-13 NOTE — Telephone Encounter (Signed)
After talking with patient he stated change made about a year ago. Having increased shortness of breath walking up hills, this is new. Offered an appointment for next week but he will be out of town. Will call him Tuesday and schedule office visit with  Dr. Patty Sermons or Dawayne Patricia. NP

## 2012-10-13 NOTE — Telephone Encounter (Signed)
New Problem:    Patient called in returning your call. Please call back. 

## 2012-10-13 NOTE — Telephone Encounter (Signed)
     After talking with patient he stated change made about a year ago. Having increased shortness of breath walking up hills, this is new. Offered an appointment for next week but he will be out of town. Will call him Tuesday and schedule office visit with Dr. Patty Sermons or Dawayne Patricia NP        Burnell Blanks at 10/12/2012 5:59 PM    Status: Signed             Patient was in and spoke with Daphene Calamity CMA and stated he had been seen at [facility] recently and med changes. Since change he has had shortness of breath going uphill and wanted to go back on previous dose. Unsure of medication that was changed. Unable to leave message at home number. Will try again tomorrow

## 2012-10-17 NOTE — Telephone Encounter (Signed)
Scheduled appointment for next week since patient out of town this week

## 2012-10-25 ENCOUNTER — Encounter: Payer: Self-pay | Admitting: Nurse Practitioner

## 2012-10-25 ENCOUNTER — Ambulatory Visit (HOSPITAL_COMMUNITY): Payer: Medicare Other | Attending: Cardiovascular Disease | Admitting: Radiology

## 2012-10-25 ENCOUNTER — Ambulatory Visit (INDEPENDENT_AMBULATORY_CARE_PROVIDER_SITE_OTHER): Payer: Medicare Other | Admitting: Nurse Practitioner

## 2012-10-25 VITALS — BP 90/60 | HR 72 | Ht 68.0 in | Wt 177.0 lb

## 2012-10-25 DIAGNOSIS — I2589 Other forms of chronic ischemic heart disease: Secondary | ICD-10-CM

## 2012-10-25 DIAGNOSIS — I255 Ischemic cardiomyopathy: Secondary | ICD-10-CM

## 2012-10-25 DIAGNOSIS — I379 Nonrheumatic pulmonary valve disorder, unspecified: Secondary | ICD-10-CM | POA: Insufficient documentation

## 2012-10-25 DIAGNOSIS — I059 Rheumatic mitral valve disease, unspecified: Secondary | ICD-10-CM | POA: Insufficient documentation

## 2012-10-25 DIAGNOSIS — I079 Rheumatic tricuspid valve disease, unspecified: Secondary | ICD-10-CM | POA: Insufficient documentation

## 2012-10-25 DIAGNOSIS — I359 Nonrheumatic aortic valve disorder, unspecified: Secondary | ICD-10-CM | POA: Insufficient documentation

## 2012-10-25 DIAGNOSIS — R0602 Shortness of breath: Secondary | ICD-10-CM | POA: Insufficient documentation

## 2012-10-25 LAB — BASIC METABOLIC PANEL
BUN: 34 mg/dL — ABNORMAL HIGH (ref 6–23)
CO2: 30 mEq/L (ref 19–32)
Calcium: 9.6 mg/dL (ref 8.4–10.5)
Chloride: 97 mEq/L (ref 96–112)
Creatinine, Ser: 1.8 mg/dL — ABNORMAL HIGH (ref 0.4–1.5)
GFR: 40.5 mL/min — ABNORMAL LOW (ref >60.00)
Glucose, Bld: 110 mg/dL — ABNORMAL HIGH (ref 70–99)
Potassium: 4 mEq/L (ref 3.5–5.1)
Sodium: 134 mEq/L — ABNORMAL LOW (ref 135–145)

## 2012-10-25 LAB — MAGNESIUM: Magnesium: 2.2 mg/dL (ref 1.5–2.5)

## 2012-10-25 MED ORDER — SOTALOL HCL 80 MG PO TABS: 80.0000 mg | ORAL_TABLET | Freq: Two times a day (BID) | ORAL | Status: DC

## 2012-10-25 MED ORDER — EPLERENONE 25 MG PO TABS: 25.0000 mg | ORAL_TABLET | Freq: Every day | ORAL | Status: DC

## 2012-10-25 NOTE — Progress Notes (Signed)
Echocardiogram performed.  

## 2012-10-25 NOTE — Progress Notes (Signed)
[name] Date of Birth: May 23, 1941 Medical Record #[id number]  History of Present Illness: [name] is seen back today for a work in visit. Seen for Dr. Patty Sermons. He has a history of an ischemic CM with an EF of 25 to 30%. He has an ICD in place. Has had VT and has had VT ablation x 3 - in 2012 and last in 2013. Second procedure was complicated by pericardial perforation and pericarditis. Other issues include AAA with endograft repair in 2009, chronic systolic heart failure, LBBB, chronic anticoagulation, HTN, anxiety, HLD, and hypothyroidism. He has had remote PCTA of the LAD in 1991, stent to the proximal RCA and mid LCX in 2005.   Last seen here in February and seemed to be doing ok.   Called 2 weeks ago with increased shortness of breath - mostly with walking up hills - which was new. Had been back to [facility] recently and had some medicine changes.   Comes in today. He is here with his wife. They tell me how he was not feeling well earlier this month. Was here to get his INR and got an appointment. That following night had what sounds like audible rales - called EMS - given oxygen and albuterol - refused transfer to the hospital. Got some better. Went to [employer] and had trouble again - got short of breath - taken to [facility] - stayed 2 days. Received IV lasix - diuresed about 10 pounds. Came home yesterday - here today. Did have some medicine changes there. Prior to all of this, he had not been weighing daily and had been using salt. His ICD was checked and showed "wet" events from the early part of the month.   He has been home less than 24 hours. Still feels a little weak. Not short of breath. No swelling. No chest pain.    Current Outpatient Prescriptions on File Prior to Visit  Medication Sig Dispense Refill  . ALPRAZolam (XANAX) 0.25 MG tablet Take 1 tablet (0.25 mg total) by mouth 4 (four) times daily as needed. For anxiety  360 tablet  1  . Ascorbic Acid (VITAMIN  C) 500 MG tablet Take 500 mg by mouth daily.        . carvedilol (COREG) 12.5 MG tablet Take one tablet by mouth twice daily      . levothyroxine (SYNTHROID, LEVOTHROID) 88 MCG tablet Take 88 mcg by mouth daily.       . Loratadine (CLARITIN) 10 MG CAPS Take 1 capsule by mouth daily.       [name] losartan (COZAAR) 50 MG tablet Take 0.5 tablets (25 mg total) by mouth 2 (two) times daily.  90 tablet  3  . meclizine (ANTIVERT) 25 MG tablet Take 1 tablet (25 mg total) by mouth 3 (three) times daily.  270 tablet  1  . mexiletine (MEXITIL) 150 MG capsule TAKE 1 CAPSULE THREE TIMES A DAY  270 capsule  4  . Multiple Vitamin (MULITIVITAMIN WITH MINERALS) TABS Take 1 tablet by mouth daily.      . Omega-3 Fatty Acids (FISH OIL) 1000 MG CAPS Take 1,000 mg by mouth 2 (two) times daily.       . rosuvastatin (CRESTOR) 5 MG tablet Take 1 tablet every other day or as directed  90 tablet  3  . sotalol (BETAPACE) 80 MG tablet Take 80 mg by mouth 2 (two) times daily.       [name] warfarin (COUMADIN) 2.5 MG tablet Take as  directed by Anticoagulation clinic.  90 tablet  1   No current facility-administered medications on file prior to visit.    Allergies  Allergen Reactions  . Iohexol      Code: HIVES, Desc: hives w/ itching during cardiac cath '99, mult caths since w/ ? premeds, DR.G.HAYES REQUESTS 13 HR PRE MED//A.C.pt okay w/13 hr prep/mms, Onset Date: 14782956   . Prednisone Other (See Comments)    unknown    Past Medical History  Diagnosis Date  . CAD (coronary artery disease) 1999    Remote anterolater and apical MI, cath 2011 patent stents RCA/LCx  LAD w/o obstruction  . Cardiomyopathy, ischemic     EF 20-25%  echo 2012  . Chronic systolic congestive heart failure   . ICD (implantable cardiac defibrillator) in place     [name]  . 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 Abdominal aortic aneurysm 2009    Repaired with endovascular stent  . PVD (peripheral vascular disease)     Has occluded innominate  vein  . Myocardial infarction   . Hypertension   . Ventricular tachycardia     recurrent slow VT at 100-110 bpm,  Rx mexilitene/sotalol  . Left bundle branch block   . Systolic CHF, chronic   . Arthritis   . Gynecomastia     2/2 spironolactone    Past Surgical History  Procedure Laterality Date  . Icd  Feb 2012    Change out with LV lead placed with tunneling from the right  to the left side  . Endovascular stent insertion      for AAA  . Cardiac catheterization      x2  . Pacemaker insertion    . Ventricular ablation surgery      s/p prior EPS and ablation at [facility] 9/12, [facility] 11/12, and most recent at [facility] 08/23/11  . Tonsillectomy      History  Smoking status  . Former Smoker  Smokeless tobacco  . Former Neurosurgeon  . Quit date: 06/01/1987    History  Alcohol Use  . Yes    Comment: very rare    Family History  Problem Relation Age of Onset  . Stroke Mother   . Heart attack Father   . Heart attack Mother     Review of Systems: The review of systems is per the HPI.  All other systems were reviewed and are negative.  Physical Exam: BP 90/60  Pulse 72  Ht 5\' 8"  (1.727 m)  Wt 177 lb (80.287 kg)  BMI 26.92 kg/m2 Patient is very pleasant and in no acute distress. Skin is warm and dry. Color is normal.  HEENT is unremarkable. Normocephalic/atraumatic. PERRL. Sclera are nonicteric. Neck is supple. No masses. No JVD. Lungs are clear. Cardiac exam shows a regular rate and rhythm. Abdomen is soft. Extremities are without edema. Gait and ROM are intact. Using a cane. No gross neurologic deficits noted.  LABORATORY DATA: Lab Results  Component Value Date   WBC 8.2 08/23/2011   HGB 14.3 08/23/2011   HCT 43.6 08/23/2011   PLT 213 08/23/2011   GLUCOSE 98 08/23/2011   CHOL  Value: 171        ATP III CLASSIFICATION:  <200     mg/dL   Desirable  213-086  mg/dL   Borderline High  >=578    mg/dL   High 4/69/6295   TRIG 47 12/21/2007   HDL 47 12/21/2007   LDLCALC  Value:  115        Total Cholesterol/HDL:CHD Risk Coronary Heart Disease Risk Table                     Men   Women  1/2 Average Risk   3.4   3.3* 12/21/2007   ALT 16 08/23/2011   AST 19 08/23/2011   NA 139 08/23/2011   K 4.3 08/23/2011   CL 101 08/23/2011   CREATININE 1.13 08/23/2011   BUN 15 08/23/2011   CO2 29 08/23/2011   TSH 1.992 02/13/2011   INR 2.1 10/10/2012   Echo Study Conclusions from 2012  - Left ventricle: The cavity size was normal. Wall thickness was increased in a pattern of mild LVH. There was septal-lateral dyssynchrony. Systolic function was moderate to severely reduced. The estimated ejection fraction was 30%. The anterior wall was severely hypokinetic. The mid anteroseptum was akinetic. The apex and peri-apical segments were akinetic. Features are consistent with a pseudonormal left ventricular filling pattern, with concomitant abnormal relaxation and increased filling pressure (grade 2 diastolic dysfunction). E/medial e' > 15 suggests LV end diastolic pressure at least 20 mmHg. - Aortic valve: There was no stenosis. Trivial regurgitation. - Mitral valve: Mild regurgitation. - Left atrium: The atrium was mildly dilated. - Right ventricle: The cavity size was normal. Pacer wire or catheter noted in right ventricle. Systolic function was mildly reduced. - Tricuspid valve: Peak RV-RA gradient: 23mm Hg (S). - Pulmonary arteries: PA systolic pressure 29-33 mmHg. - Systemic veins: IVC measured 1.9 cm with normal respirophasic variation, suggesting RA pressure 6-10 mmHg.   Assessment / Plan: 1. Ischemic CM - EF of about 30% per echo from 2012 - recent exacerbation of CHF - required hospitalization. Looks better now. Will recheck his labs. Update his echo. See him back next Friday. Hope to try and increase his Coreg back up if BP allow. Reminded about weighing daily and restricting salt now.   2. HTN - BP ok. Minimally symptomatic. No change in his medicines for now.   3. HLD -  on statin therapy.   4. Chronic coumadin - no adverse effects noted.   5. Underlying ICD  Patient is agreeable to this plan and will call if any problems develop in the interim.   Rosalio Macadamia, RN, ANP-C [facility] [address] [place], [place]  [address]

## 2012-10-25 NOTE — Patient Instructions (Addendum)
Stay on your current medicines  We will check lab today to look at your fluid status and check your electrolytes  We will get an ultrasound of your heart before next Friday  Weigh every morning - if your weight goes up 2 or more pounds overnight - take an extra dose of Lasix  I will see you next Friday  Call the [facility] office at [phone] if you have any questions, problems or concerns.

## 2012-10-27 ENCOUNTER — Telehealth: Payer: Self-pay | Admitting: *Deleted

## 2012-10-27 LAB — BRAIN NATRIURETIC PEPTIDE: Pro B Natriuretic peptide (BNP): 256 pg/mL — ABNORMAL HIGH (ref 0.0–100.0)

## 2012-10-27 NOTE — Telephone Encounter (Signed)
Message copied by Burnell Blanks on Fri Oct 27, 2012  5:52 PM ------      Message from: Cassell Clement      Created: Thu Oct 26, 2012  2:13 PM       Please report.  Two-dimensional echocardiogram in stable and not significantly changed since 2012. ------

## 2012-10-27 NOTE — Telephone Encounter (Signed)
Advised patient

## 2012-10-30 ENCOUNTER — Ambulatory Visit (INDEPENDENT_AMBULATORY_CARE_PROVIDER_SITE_OTHER): Payer: Medicare Other | Admitting: *Deleted

## 2012-10-30 ENCOUNTER — Encounter: Payer: Self-pay | Admitting: Internal Medicine

## 2012-10-30 DIAGNOSIS — I2589 Other forms of chronic ischemic heart disease: Secondary | ICD-10-CM

## 2012-10-30 DIAGNOSIS — Z95810 Presence of automatic (implantable) cardiac defibrillator: Secondary | ICD-10-CM

## 2012-10-30 DIAGNOSIS — I255 Ischemic cardiomyopathy: Secondary | ICD-10-CM

## 2012-10-30 DIAGNOSIS — I5022 Chronic systolic (congestive) heart failure: Secondary | ICD-10-CM

## 2012-10-30 LAB — REMOTE ICD DEVICE
AL IMPEDENCE ICD: 490 Ohm
DEV-0020ICD: NEGATIVE
DEVICE MODEL ICD: 828324
HV IMPEDENCE: 55 Ohm
RV LEAD AMPLITUDE: 11.8 mv
RV LEAD IMPEDENCE ICD: 380 Ohm
TZON-0004SLOWVT: 30
TZON-0005SLOWVT: 6
TZON-0010SLOWVT: 40 ms
VENTRICULAR PACING ICD: 95 pct

## 2012-11-03 ENCOUNTER — Encounter: Payer: Self-pay | Admitting: Nurse Practitioner

## 2012-11-03 ENCOUNTER — Encounter: Payer: Self-pay | Admitting: Internal Medicine

## 2012-11-03 ENCOUNTER — Ambulatory Visit (INDEPENDENT_AMBULATORY_CARE_PROVIDER_SITE_OTHER): Payer: Medicare Other | Admitting: Nurse Practitioner

## 2012-11-03 VITALS — BP 110/64 | HR 56 | Ht 68.0 in | Wt 175.8 lb

## 2012-11-03 DIAGNOSIS — I255 Ischemic cardiomyopathy: Secondary | ICD-10-CM

## 2012-11-03 DIAGNOSIS — I2589 Other forms of chronic ischemic heart disease: Secondary | ICD-10-CM

## 2012-11-03 LAB — BASIC METABOLIC PANEL
BUN: 16 mg/dL (ref 6–23)
CO2: 25 mEq/L (ref 19–32)
Calcium: 9.5 mg/dL (ref 8.4–10.5)
Chloride: 102 mEq/L (ref 96–112)
Creatinine, Ser: 1.3 mg/dL (ref 0.4–1.5)
GFR: 56.32 mL/min — ABNORMAL LOW (ref >60.00)
Glucose, Bld: 89 mg/dL (ref 70–99)
Potassium: 4.4 mEq/L (ref 3.5–5.1)
Sodium: 137 mEq/L (ref 135–145)

## 2012-11-03 NOTE — Patient Instructions (Addendum)
Stay on your current medicines  See Dr. Patty Sermons later this month  We will recheck your BMET today  Continue to weigh daily - take an extra dose of your Lasix for weight gain of 3 pounds over night  Keep restricting your salt  Call the [facility] office at [phone] if you have any questions, problems or concerns.

## 2012-11-03 NOTE — Progress Notes (Signed)
[name] Date of Birth: 1940/06/29 Medical Record #[id number]  History of Present Illness: [name] is seen back today for a one week check. Seen for Dr. Patty Sermons. He has a history of an ischemic CM with an EF of 25 to 30%. He has an ICD in place. Has had VT and has had VT ablation x 3 - in 2012 and last in 2013. Second procedure was complicated by pericardial perforation and pericarditis. Other issues include AAA with endograft repair in 2009, chronic systolic heart failure, LBBB, chronic anticoagulation, HTN, anxiety, HLD, and hypothyroidism. He has had remote PCTA of the LAD in 1991, stent to the proximal RCA and mid LCX in 2005.   Seen a week ago after being in the hospital at [facility] with heart failure. We updated his echo which shows no significant change - EF remains 25%.   Comes back today. He is here with his wife. Doing better. Weight is staying down. Doing better with salt restriction. No chest pain. Not short of breath. Now only taking lasix PRN.BP is in the 90's at home. Not dizzy or lightheaded.    Current Outpatient Prescriptions on File Prior to Visit  Medication Sig Dispense Refill  . ALPRAZolam (XANAX) 0.25 MG tablet Take 1 tablet (0.25 mg total) by mouth 4 (four) times daily as needed. For anxiety  360 tablet  1  . Ascorbic Acid (VITAMIN C) 500 MG tablet Take 500 mg by mouth daily.        [name] aspirin 81 MG tablet Take 81 mg by mouth daily.      . carvedilol (COREG) 12.5 MG tablet Take one tablet by mouth twice daily      . eplerenone (INSPRA) 25 MG tablet Take 1 tablet (25 mg total) by mouth daily.  90 tablet  3  . levothyroxine (SYNTHROID, LEVOTHROID) 88 MCG tablet Take 88 mcg by mouth daily.       . Loratadine (CLARITIN) 10 MG CAPS Take 1 capsule by mouth daily.       [name] losartan (COZAAR) 50 MG tablet Take 0.5 tablets (25 mg total) by mouth 2 (two) times daily.  90 tablet  3  . meclizine (ANTIVERT) 25 MG tablet Take 1 tablet (25 mg total) by mouth 3 (three)  times daily.  270 tablet  1  . mexiletine (MEXITIL) 150 MG capsule TAKE 1 CAPSULE THREE TIMES A DAY  270 capsule  4  . Multiple Vitamin (MULITIVITAMIN WITH MINERALS) TABS Take 1 tablet by mouth daily.      . Omega-3 Fatty Acids (FISH OIL) 1000 MG CAPS Take 1,000 mg by mouth 2 (two) times daily.       . rosuvastatin (CRESTOR) 5 MG tablet Take 1 tablet every other day or as directed  90 tablet  3  . sotalol (BETAPACE) 80 MG tablet Take 1 tablet (80 mg total) by mouth 2 (two) times daily.  180 tablet  3  . warfarin (COUMADIN) 2.5 MG tablet Take as directed by Anticoagulation clinic.  90 tablet  1   No current facility-administered medications on file prior to visit.    Allergies  Allergen Reactions  . Iohexol      Code: HIVES, Desc: hives w/ itching during cardiac cath '99, mult caths since w/ ? premeds, DR.G.HAYES REQUESTS 13 HR PRE MED//A.C.pt okay w/13 hr prep/mms, Onset Date: [id number]   . Prednisone Other (See Comments)    unknown    Past Medical History  Diagnosis Date  . CAD (  coronary artery disease) 1999    Remote anterolater and apical MI, cath 2011 patent stents RCA/LCx  LAD w/o obstruction  . Cardiomyopathy, ischemic     EF 20-25%  echo 2012  . Chronic systolic congestive heart failure   . ICD (implantable cardiac defibrillator) in place     [place] [facility]  . 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 Abdominal aortic aneurysm 2009    Repaired with endovascular stent  . PVD (peripheral vascular disease)     Has occluded innominate vein  . Myocardial infarction   . Hypertension   . Ventricular tachycardia     recurrent slow VT at 100-110 bpm,  Rx mexilitene/sotalol  . Left bundle branch block   . Systolic CHF, chronic   . Arthritis   . Gynecomastia     2/2 spironolactone    Past Surgical History  Procedure Laterality Date  . Icd  Feb 2012    Change out with LV lead placed with tunneling from the right  to the left side  . Endovascular stent insertion      for AAA  .  Cardiac catheterization      x2  . Pacemaker insertion    . Ventricular ablation surgery      s/p prior EPS and ablation at [facility] 9/12, [facility] 11/12, and most recent [facility] 08/23/11  . Tonsillectomy      History  Smoking status  . Former Smoker  Smokeless tobacco  . Former Neurosurgeon  . Quit date: 06/01/1987    History  Alcohol Use  . Yes    Comment: very rare    Family History  Problem Relation Age of Onset  . Stroke Mother   . Heart attack Father   . Heart attack Mother     Review of Systems: The review of systems is per the HPI.  All other systems were reviewed and are negative.  Physical Exam: BP 110/64  Pulse 56  Ht 5\' 8"  (1.727 m)  Wt 175 lb 12.8 oz (79.742 kg)  BMI 26.74 kg/m2 Patient is very pleasant and in no acute distress. Skin is warm and dry. Color is normal.  HEENT is unremarkable. Normocephalic/atraumatic. PERRL. Sclera are nonicteric. Neck is supple. No masses. No JVD. Lungs are clear. Cardiac exam shows a fairly regular rate and rhythm. Abdomen is soft. Extremities are without edema. Gait and ROM are intact. No gross neurologic deficits noted.  LABORATORY DATA: BMET is pending  Lab Results  Component Value Date   WBC 8.2 08/23/2011   HGB 14.3 08/23/2011   HCT 43.6 08/23/2011   PLT 213 08/23/2011   GLUCOSE 110* 10/25/2012   CHOL  Value: 171        ATP III CLASSIFICATION:  <200     mg/dL   Desirable  161-096  mg/dL   Borderline High  >=045    mg/dL   High 09/06/8117   TRIG 47 12/21/2007   HDL 47 12/21/2007   LDLCALC  Value: 115        Total Cholesterol/HDL:CHD Risk Coronary Heart Disease Risk Table                     Men   Women  1/2 Average Risk   3.4   3.3* 12/21/2007   ALT 16 08/23/2011   AST 19 08/23/2011   NA 134* 10/25/2012   K 4.0 10/25/2012   CL 97 10/25/2012   CREATININE 1.8* 10/25/2012  BUN 34* 10/25/2012   CO2 30 10/25/2012   TSH 1.992 02/13/2011   INR 2.1 10/10/2012   Echo Study Conclusions  - Left ventricle: Septal, apical mid and distal  anterior wall and inferior wall severe hypokinesis The cavity size was severely dilated. Wall thickness was normal. The estimated ejection fraction was 25%. - Aortic valve: Mild regurgitation. - Left atrium: The atrium was moderately dilated. - Atrial septum: No defect or patent foramen ovale was identified.    Assessment / Plan: 1. Systolic heart failure - looks better and he is doing well. No change in his current regimen.   2. HTN - BP is ok. No real room to titrate medicines further based on his home readings.   3. HLD - on statin therapy  4. Underlying ICD - has had recent remote check.  5. CRI - rechecking today. Now only using Lasix prn.   We will see him back later this month. Recheck his BMET today.   Patient is agreeable to this plan and will call if any problems develop in the interim.   Rosalio Macadamia, RN, ANP-C [facility] [address] [place], [place]  [address]

## 2012-11-17 ENCOUNTER — Ambulatory Visit (INDEPENDENT_AMBULATORY_CARE_PROVIDER_SITE_OTHER): Payer: Medicare Other

## 2012-11-17 ENCOUNTER — Ambulatory Visit (INDEPENDENT_AMBULATORY_CARE_PROVIDER_SITE_OTHER): Payer: Medicare Other | Admitting: Cardiology

## 2012-11-17 ENCOUNTER — Encounter: Payer: Self-pay | Admitting: Cardiology

## 2012-11-17 VITALS — BP 114/68 | HR 79 | Ht 68.0 in | Wt 177.0 lb

## 2012-11-17 DIAGNOSIS — I255 Ischemic cardiomyopathy: Secondary | ICD-10-CM

## 2012-11-17 DIAGNOSIS — I259 Chronic ischemic heart disease, unspecified: Secondary | ICD-10-CM

## 2012-11-17 DIAGNOSIS — I472 Ventricular tachycardia: Secondary | ICD-10-CM

## 2012-11-17 DIAGNOSIS — Z7901 Long term (current) use of anticoagulants: Secondary | ICD-10-CM

## 2012-11-17 DIAGNOSIS — I10 Essential (primary) hypertension: Secondary | ICD-10-CM

## 2012-11-17 DIAGNOSIS — I2510 Atherosclerotic heart disease of native coronary artery without angina pectoris: Secondary | ICD-10-CM

## 2012-11-17 DIAGNOSIS — I2589 Other forms of chronic ischemic heart disease: Secondary | ICD-10-CM

## 2012-11-17 LAB — POCT INR: INR: 2

## 2012-11-17 NOTE — Assessment & Plan Note (Signed)
His blood daily pressures have been running in the range of 90/60.  He feels slightly lightheaded.  He previously had been doing well on just half of a Inspra tablet daily.  To allow a slightly higher resting blood pressure we will cut him back to his previous dose.

## 2012-11-17 NOTE — Progress Notes (Signed)
[name] Date of Birth:  Feb 18, 1941 [facility] [address] [place], [place]  [address] [phone]         Fax   [phone]  History of Present Illness: This pleasant 72 year old gentleman is seen for a three-month followup office visit. He has a history of a remote anterior wall myocardial infarction. He has an ischemic cardiomyopathy. He has had a defibrillator for ventricular tachycardia. He has been followed closely by Dr. Graciela Husbands and Dr. Johney Frame. 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 Subsequently he had recurrent ventricular tachycardia and was transferred to [facility] for incessant VT and underwent a second ablation procedure complicated by pericardial perforation and pericarditis. He underwent a third catheter VT ablation procedure in March 2013 by Dr. Johney Frame and since then has felt well. He has not been aware of any palpitations. He has not been aware of any shocks from his ICD. He does have a known ischemic cardiomyopathy with ejection fraction of 25-30% by echocardiogram in February 2012. Since last visit  he had some problems with fluid overload [place] responded to increased diuretic and to dietary salt restriction.  Recently he has been having some problems with low blood pressure and lightheadedness.   Current Outpatient Prescriptions  Medication Sig Dispense Refill  . ALPRAZolam (XANAX) 0.25 MG tablet Take 1 tablet (0.25 mg total) by mouth 4 (four) times daily as needed. For anxiety  360 tablet  1  . Ascorbic Acid (VITAMIN C) 500 MG tablet Take 500 mg by mouth daily.        [name] aspirin 81 MG tablet Take 81 mg by mouth daily.      . carvedilol (COREG) 12.5 MG tablet Take one tablet by mouth twice daily      . eplerenone (INSPRA) 25 MG tablet Take 1 tablet (25 mg total) by mouth daily.  90 tablet  3  . furosemide (LASIX) 20 MG tablet Take 20 mg by mouth as needed. Prn weight  gain of 3 pounds overnight      . levothyroxine (SYNTHROID, LEVOTHROID) 88 MCG tablet Take 88 mcg by mouth daily.       . Loratadine (CLARITIN) 10 MG CAPS Take 1 capsule by mouth daily.       [name] losartan (COZAAR) 50 MG tablet Take 0.5 tablets (25 mg total) by mouth 2 (two) times daily.  90 tablet  3  . meclizine (ANTIVERT) 25 MG tablet Take 1 tablet (25 mg total) by mouth 3 (three) times daily.  270 tablet  1  . mexiletine (MEXITIL) 150 MG capsule TAKE 1 CAPSULE THREE TIMES A DAY  270 capsule  4  . Multiple Vitamin (MULITIVITAMIN WITH MINERALS) TABS Take 1 tablet by mouth daily.      . Omega-3 Fatty Acids (FISH OIL) 1000 MG CAPS Take 1,000 mg by mouth 2 (two) times daily.       . rosuvastatin (CRESTOR) 5 MG tablet Take 1 tablet every other day or as directed  90 tablet  3  . sotalol (BETAPACE) 80 MG tablet Take 1 tablet (80 mg total) by mouth 2 (two) times daily.  180 tablet  3  . warfarin (COUMADIN) 2.5 MG tablet Take as directed by Anticoagulation clinic.  90 tablet  1   No current facility-administered medications for this visit.    Allergies  Allergen Reactions  . Iohexol      Code: HIVES, Desc: hives  w/ itching during cardiac cath '99, mult caths since w/ ? premeds, DR.G.HAYES REQUESTS 13 HR PRE MED//A.C.pt okay w/13 hr prep/mms, Onset Date: [id number]   . Prednisone Other (See Comments)    unknown    Patient Active Problem List   Diagnosis Date Noted  . Coronary artery disease 04/08/2011  . Anxiety 04/08/2011  . HTN (hypertension) 02/02/2011  . ICD (implantable cardiac defibrillator), biventricular,[place] 11/03/2010  . Sinus bradycardia 11/03/2010  . Chronic anticoagulation 10/05/2010  . Cardiomyopathy, ischemic   . Chronic systolic heart failure   . Ventricular tachycardia   . LBBB 05/21/2010  . 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 ABDOMINAL AORTIC ANEURYSM 02/07/2009    History  Smoking status  . Former Smoker  Smokeless tobacco  . Former Neurosurgeon  . Quit date: 06/01/1987     History  Alcohol Use  . Yes    Comment: very rare    Family History  Problem Relation Age of Onset  . Stroke Mother   . Heart attack Father   . Heart attack Mother     Review of Systems: Constitutional: no fever chills diaphoresis or fatigue or change in weight.  Head and neck: no hearing loss, no epistaxis, no photophobia or visual disturbance. Respiratory: No cough, shortness of breath or wheezing. Cardiovascular: No chest pain peripheral edema, palpitations. Gastrointestinal: No abdominal distention, no abdominal pain, no change in bowel habits hematochezia or melena. Genitourinary: No dysuria, no frequency, no urgency, no nocturia. Musculoskeletal:No arthralgias, no back pain, no gait disturbance or myalgias. Neurological: No dizziness, no headaches, no numbness, no seizures, no syncope, no weakness, no tremors. Hematologic: No lymphadenopathy, no easy bruising. Psychiatric: No confusion, no hallucinations, no sleep disturbance.    Physical Exam: Filed Vitals:   11/17/12 0925  BP: 114/68  Pulse: 79   the general appearance reveals a well-developed well-nourished middle-aged gentleman in no distress.The head and neck exam reveals pupils equal and reactive.  Extraocular movements are full.  There is no scleral icterus.  The mouth and pharynx are normal.  The neck is supple.  The carotids reveal no bruits.  The jugular venous pressure is normal.  The  thyroid is not enlarged.  There is no lymphadenopathy.  The chest is clear to percussion and auscultation.  There are no rales or rhonchi.  Expansion of the chest is symmetrical.  The precordium is quiet.  The first heart sound is normal.  The second heart sound is physiologically split.  There is no murmur gallop rub or click.  There is no abnormal lift or heave.  The abdomen is soft and nontender.  The bowel sounds are normal.  The liver and spleen are not enlarged.  There are no abdominal masses.  There are no abdominal bruits.   Extremities reveal good pedal pulses.  There is no phlebitis or edema.  There is no cyanosis or clubbing.  Strength is normal and symmetrical in all extremities.  There is no lateralizing weakness.  There are no sensory deficits.  The skin is warm and dry.  There is no rash.  EKG shows AV paced rhythm and no ectopy    Assessment / Plan: Continue on same medication except reduce Inspra to one half tablet daily to allow higher blood pressure.  Recheck in 3 months for followup office visit and basal metabolic panel.

## 2012-11-17 NOTE — Patient Instructions (Addendum)
Decrease Inspra to 1/2 tablet daily    Your physician recommends that you schedule a follow-up appointment in: 3 months with a bmet

## 2012-11-17 NOTE — Assessment & Plan Note (Signed)
The patient has not been aware of any tachycardia or rapid heart palpitations.

## 2012-11-17 NOTE — Assessment & Plan Note (Signed)
The patient has not been experiencing any chest pain or recurrent angina. 

## 2012-11-20 ENCOUNTER — Other Ambulatory Visit: Payer: Self-pay | Admitting: Cardiology

## 2012-11-20 ENCOUNTER — Encounter: Payer: Self-pay | Admitting: *Deleted

## 2012-11-23 ENCOUNTER — Other Ambulatory Visit: Payer: Self-pay | Admitting: Cardiology

## 2012-11-28 ENCOUNTER — Ambulatory Visit: Payer: Medicare Other | Admitting: Cardiology

## 2012-12-05 ENCOUNTER — Encounter: Payer: Self-pay | Admitting: Cardiology

## 2012-12-20 ENCOUNTER — Telehealth: Payer: Self-pay | Admitting: *Deleted

## 2012-12-20 NOTE — Telephone Encounter (Signed)
Patient dropped off note requesting samples of Crestor 5 mg, #4 lot # [id number] exp 12/16 left at front. Called and advised

## 2012-12-21 ENCOUNTER — Other Ambulatory Visit: Payer: Self-pay | Admitting: Cardiology

## 2012-12-26 ENCOUNTER — Ambulatory Visit: Payer: Medicare Other | Admitting: Neurosurgery

## 2012-12-27 ENCOUNTER — Encounter (INDEPENDENT_AMBULATORY_CARE_PROVIDER_SITE_OTHER): Payer: Medicare Other | Admitting: Vascular Surgery

## 2012-12-27 DIAGNOSIS — I714 Abdominal aortic aneurysm, without rupture, unspecified: Secondary | ICD-10-CM

## 2012-12-27 DIAGNOSIS — Z48812 Encounter for surgical aftercare following surgery on the circulatory system: Secondary | ICD-10-CM

## 2012-12-28 ENCOUNTER — Other Ambulatory Visit: Payer: Self-pay | Admitting: *Deleted

## 2012-12-28 DIAGNOSIS — Z48812 Encounter for surgical aftercare following surgery on the circulatory system: Secondary | ICD-10-CM

## 2012-12-28 DIAGNOSIS — I714 Abdominal aortic aneurysm, without rupture: Secondary | ICD-10-CM

## 2012-12-29 ENCOUNTER — Ambulatory Visit (INDEPENDENT_AMBULATORY_CARE_PROVIDER_SITE_OTHER): Payer: Medicare Other | Admitting: *Deleted

## 2012-12-29 DIAGNOSIS — Z7901 Long term (current) use of anticoagulants: Secondary | ICD-10-CM

## 2012-12-29 DIAGNOSIS — I2589 Other forms of chronic ischemic heart disease: Secondary | ICD-10-CM

## 2012-12-29 DIAGNOSIS — I255 Ischemic cardiomyopathy: Secondary | ICD-10-CM

## 2013-01-03 ENCOUNTER — Other Ambulatory Visit: Payer: Self-pay

## 2013-01-09 ENCOUNTER — Encounter: Payer: Self-pay | Admitting: Vascular Surgery

## 2013-01-13 IMAGING — CR DG CHEST 2V
2 series · 2 of 2 positions shown · non-contrast
Comparison: 02/11/2011

CLINICAL DATA: Arrhythmia.  Hypertension.  Nonsmoker.

CHEST - 2 VIEW

[w chest pa]
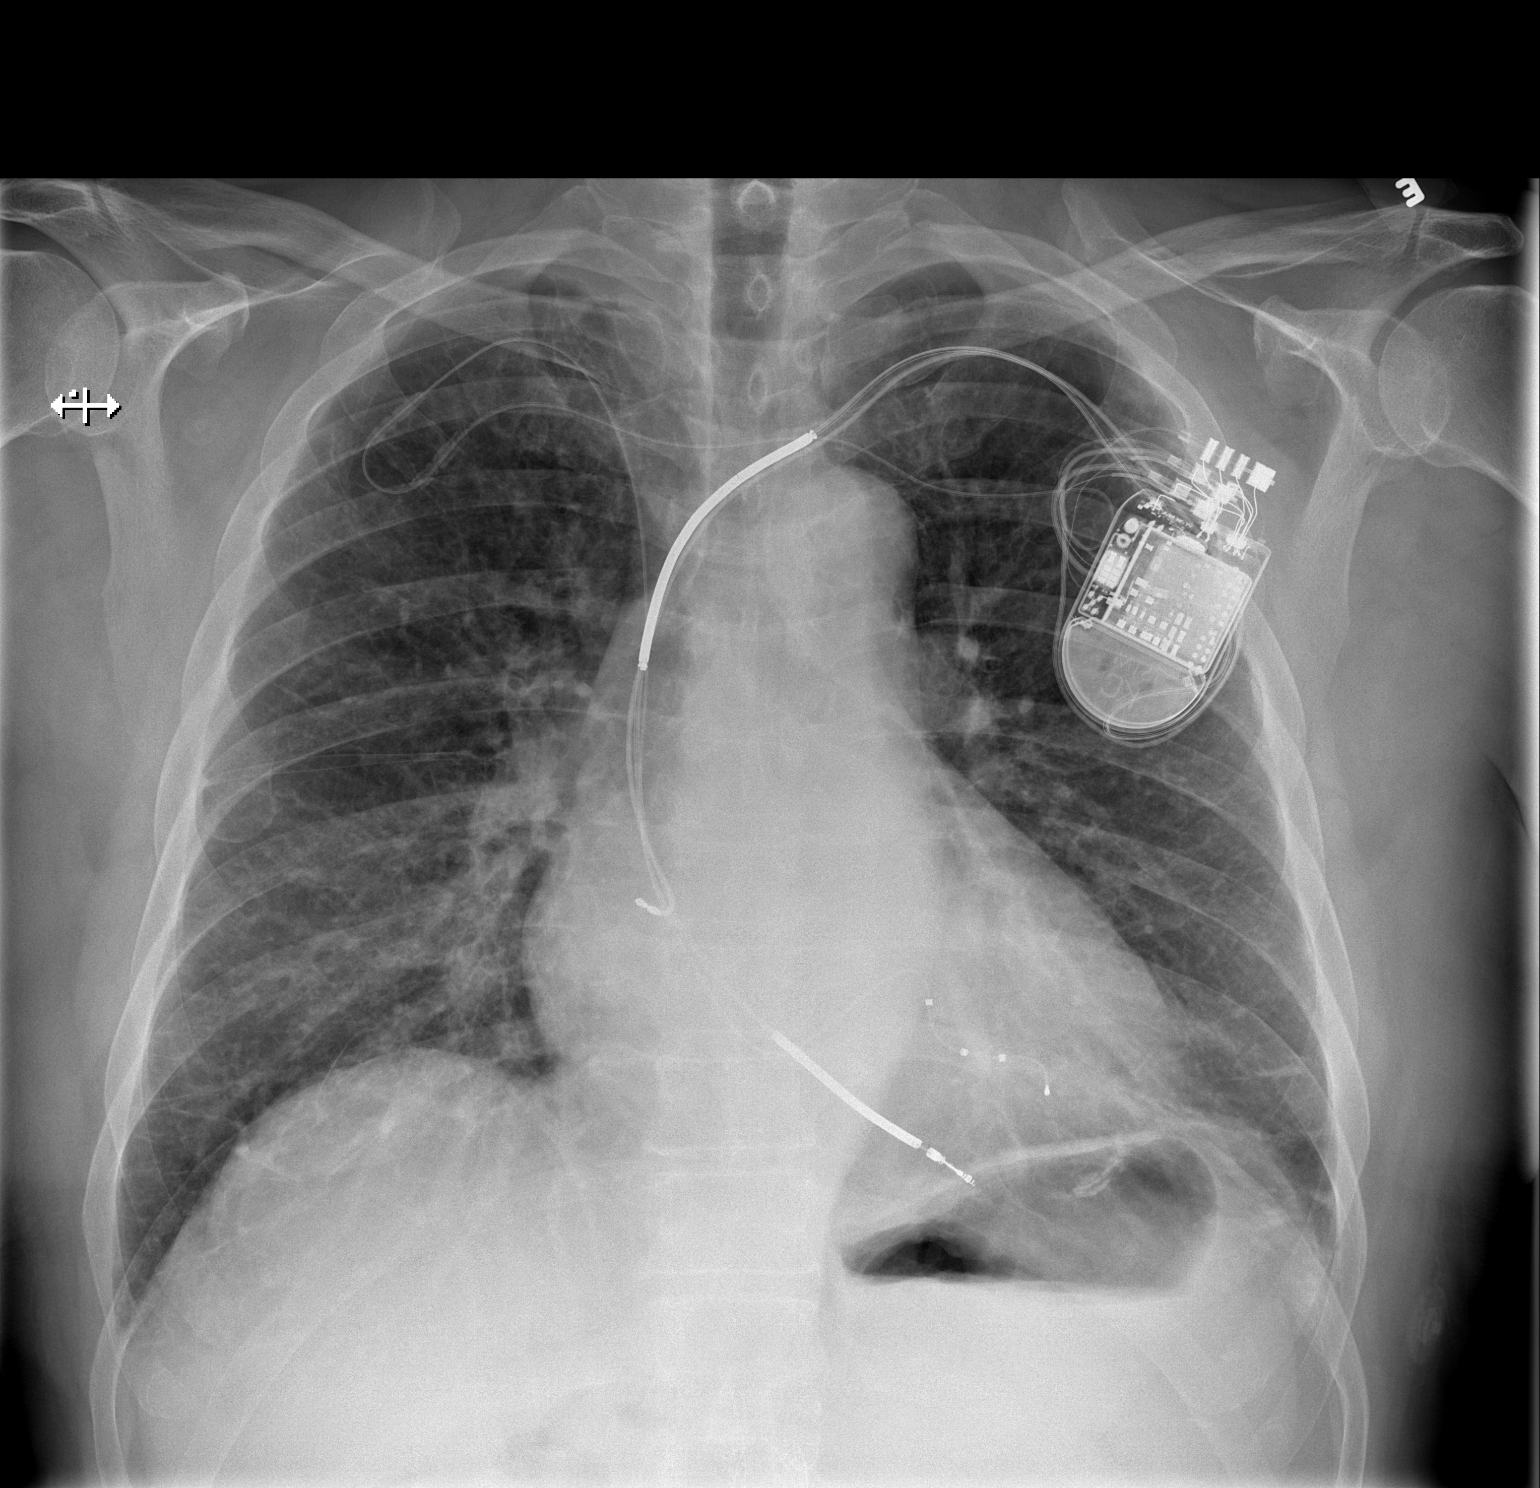

[w chest lat]
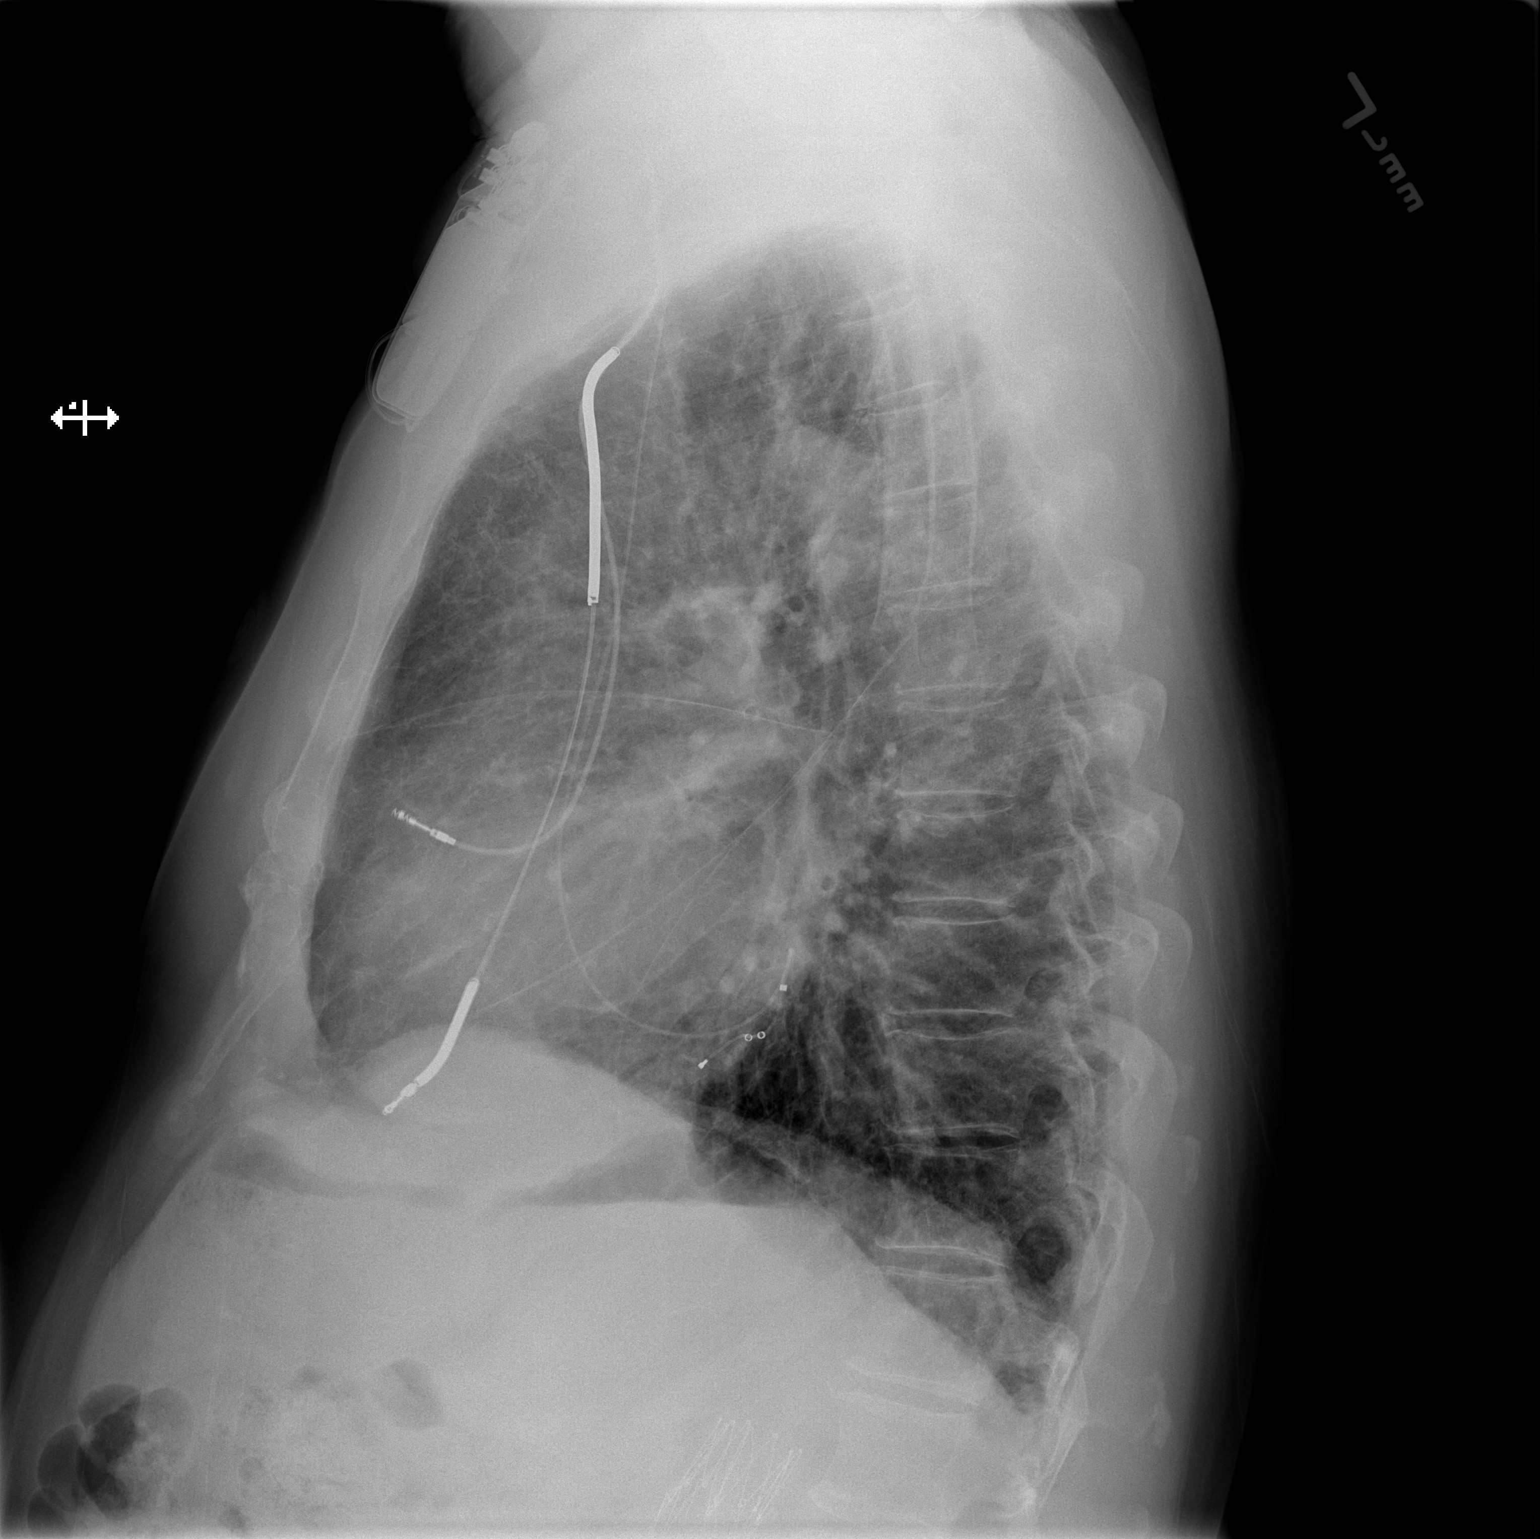

[2 of 2 positions shown; findings below may reference images not displayed]

FINDINGS: Probable IVC filter.  Pacer / AICD device.  Atypical
course of one of the wires is not significantly changed.

Midline trachea.  Mild cardiomegaly. No pleural effusion or
pneumothorax.  Mild interstitial thickening is new since the prior
exam.  Subpleural septal thickening / Kerley B lines. No lobar
consolidation.
IMPRESSION: 1.  New interstitial thickening, favored to represent pulmonary
venous congestion.  No overt congestive failure.
2.  Mild cardiomegaly.

## 2013-01-19 ENCOUNTER — Ambulatory Visit (INDEPENDENT_AMBULATORY_CARE_PROVIDER_SITE_OTHER): Payer: Medicare Other | Admitting: *Deleted

## 2013-01-19 DIAGNOSIS — I255 Ischemic cardiomyopathy: Secondary | ICD-10-CM

## 2013-01-19 DIAGNOSIS — Z7901 Long term (current) use of anticoagulants: Secondary | ICD-10-CM

## 2013-01-19 DIAGNOSIS — I2589 Other forms of chronic ischemic heart disease: Secondary | ICD-10-CM

## 2013-01-19 LAB — POCT INR: INR: 1.9

## 2013-01-30 ENCOUNTER — Ambulatory Visit (INDEPENDENT_AMBULATORY_CARE_PROVIDER_SITE_OTHER): Payer: Medicare Other | Admitting: *Deleted

## 2013-01-30 DIAGNOSIS — I5022 Chronic systolic (congestive) heart failure: Secondary | ICD-10-CM

## 2013-01-30 DIAGNOSIS — Z95810 Presence of automatic (implantable) cardiac defibrillator: Secondary | ICD-10-CM

## 2013-01-30 DIAGNOSIS — R001 Bradycardia, unspecified: Secondary | ICD-10-CM

## 2013-01-30 DIAGNOSIS — I255 Ischemic cardiomyopathy: Secondary | ICD-10-CM

## 2013-01-30 DIAGNOSIS — I2589 Other forms of chronic ischemic heart disease: Secondary | ICD-10-CM

## 2013-01-30 DIAGNOSIS — I472 Ventricular tachycardia, unspecified: Secondary | ICD-10-CM

## 2013-01-30 DIAGNOSIS — I498 Other specified cardiac arrhythmias: Secondary | ICD-10-CM

## 2013-01-31 LAB — REMOTE ICD DEVICE
AL IMPEDENCE ICD: 400 Ohm
DEVICE MODEL ICD: 828324
HV IMPEDENCE: 50 Ohm
LV LEAD IMPEDENCE ICD: 930 Ohm
RV LEAD IMPEDENCE ICD: 350 Ohm
TZON-0004SLOWVT: 30
TZON-0005SLOWVT: 6
TZON-0010SLOWVT: 40 ms
VENTRICULAR PACING ICD: 99 pct

## 2013-02-01 ENCOUNTER — Encounter: Payer: Self-pay | Admitting: Internal Medicine

## 2013-02-09 NOTE — Progress Notes (Signed)
ICD remote. All functions normal, no NSVT. Full details in PaceArt.  ROV w/ Dr. Graciela Husbands 03/2013.

## 2013-02-14 ENCOUNTER — Other Ambulatory Visit: Payer: Self-pay | Admitting: Cardiology

## 2013-02-15 ENCOUNTER — Ambulatory Visit (INDEPENDENT_AMBULATORY_CARE_PROVIDER_SITE_OTHER): Payer: Medicare Other | Admitting: Cardiology

## 2013-02-15 ENCOUNTER — Ambulatory Visit (INDEPENDENT_AMBULATORY_CARE_PROVIDER_SITE_OTHER): Payer: Medicare Other | Admitting: *Deleted

## 2013-02-15 ENCOUNTER — Other Ambulatory Visit: Payer: Medicare Other

## 2013-02-15 ENCOUNTER — Encounter: Payer: Self-pay | Admitting: Cardiology

## 2013-02-15 VITALS — BP 108/60 | HR 76 | Ht 68.0 in | Wt 173.0 lb

## 2013-02-15 DIAGNOSIS — E785 Hyperlipidemia, unspecified: Secondary | ICD-10-CM

## 2013-02-15 DIAGNOSIS — Z79899 Other long term (current) drug therapy: Secondary | ICD-10-CM

## 2013-02-15 DIAGNOSIS — Z7901 Long term (current) use of anticoagulants: Secondary | ICD-10-CM

## 2013-02-15 DIAGNOSIS — I2510 Atherosclerotic heart disease of native coronary artery without angina pectoris: Secondary | ICD-10-CM

## 2013-02-15 DIAGNOSIS — I255 Ischemic cardiomyopathy: Secondary | ICD-10-CM

## 2013-02-15 DIAGNOSIS — I2589 Other forms of chronic ischemic heart disease: Secondary | ICD-10-CM

## 2013-02-15 LAB — BASIC METABOLIC PANEL
CO2: 29 mEq/L (ref 19–32)
Calcium: 9.1 mg/dL (ref 8.4–10.5)
Creatinine, Ser: 1.2 mg/dL (ref 0.4–1.5)
GFR: 62.17 mL/min (ref >60.00)
Sodium: 137 mEq/L (ref 135–145)

## 2013-02-15 LAB — ICD DEVICE OBSERVATION
AL AMPLITUDE: 3 mv
AL THRESHOLD: 2.875 V
DEV-0020ICD: NEGATIVE
LV LEAD IMPEDENCE ICD: 962.5 Ohm
PACEART VT: 2
RV LEAD IMPEDENCE ICD: 350 Ohm
TOT-0006: 20120229000000
TOT-0007: 0
TOT-0009: 1
TOT-0010: 16
TZAT-0001SLOWVT: 1
TZAT-0018SLOWVT: NEGATIVE
TZAT-0019SLOWVT: 7.5 V
TZON-0003SLOWVT: 570 ms
TZON-0004SLOWVT: 30
TZON-0010SLOWVT: 40 ms
VF: 2

## 2013-02-15 LAB — POCT INR: INR: 2.2

## 2013-02-15 NOTE — Assessment & Plan Note (Signed)
Patient has a history of dyslipidemia.  He was unable to tolerate Lipitor because of myalgias in the legs.  He is able to take Crestor 5 mg every other day with no recurrence of myalgias and his recent blood work done by Dr. Evlyn Kanner was satisfactory.

## 2013-02-15 NOTE — Assessment & Plan Note (Signed)
Patient has an ischemic dilated cardiomyopathy.  He is on long-term Coumadin.  He has not had any thromboembolic episodes.

## 2013-02-15 NOTE — Patient Instructions (Addendum)
Will obtain labs today and call you with the results (BMET)  Your physician recommends that you continue on your current medications as directed. Please refer to the Current Medication list given to you today.  Your physician recommends that you schedule a follow-up appointment in: 3 month ov/ekg

## 2013-02-15 NOTE — Progress Notes (Signed)
VT 1 zone reprogrammed from monitor zone to ATP therapy. SVT discriminators reprogrammed to if any.  Follow up as scheduled in December with Dr. Graciela Husbands.

## 2013-02-15 NOTE — Assessment & Plan Note (Signed)
The patient has not had any chest pain or recurrent angina.

## 2013-02-15 NOTE — Progress Notes (Signed)
[name] Date of Birth:  28-Aug-1940 [facility] [address] [place], [place]  [address] [phone]         Fax   [phone]  History of Present Illness: This pleasant 72 year old gentleman is seen for a three-month followup office visit. He has a history of a remote anterior wall myocardial infarction. He has an ischemic cardiomyopathy. He has had a defibrillator for ventricular tachycardia. He has been followed closely by Dr. Graciela Husbands and Dr. Johney Frame. 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 Subsequently he had recurrent ventricular tachycardia and was transferred to [facility] for incessant VT and underwent a second ablation procedure complicated by pericardial perforation and pericarditis. He underwent a third catheter VT ablation procedure in March 2013 by Dr. Johney Frame and since then has felt well. He has not been aware of any palpitations. He has not been aware of any shocks from his ICD. He does have a known ischemic cardiomyopathy with ejection fraction of 25-30% by echocardiogram in February 2012. Since last visit  he had some problems with fluid overload [place] and responded to increased diuretic and to dietary salt restriction.  About 3 weeks ago he had an episode of palpitations lasting about 2 minutes.  His heart rate was noted to be 113 and Dr. Graciela Husbands analyzed the rhythm from the defibrillator and diagnosed as a very slow ventricular tachycardia.  There were no changes made in his regimen.  He has had no recurrence..   Current Outpatient Prescriptions  Medication Sig Dispense Refill  . ALPRAZolam (XANAX) 0.25 MG tablet Take 1 tablet (0.25 mg total) by mouth 4 (four) times daily as needed. For anxiety  360 tablet  1  . Ascorbic Acid (VITAMIN C) 500 MG tablet Take 500 mg by mouth daily.        [name] aspirin 81 MG tablet Take 81 mg by mouth daily.      . carvedilol (COREG) 12.5 MG tablet Take  one tablet by mouth twice daily      . eplerenone (INSPRA) 25 MG tablet Take 25 mg by mouth as directed. 1/2 tablet daily      . furosemide (LASIX) 20 MG tablet Take 20 mg by mouth as needed. Prn weight gain of 3 pounds overnight      . levothyroxine (SYNTHROID, LEVOTHROID) 88 MCG tablet Take 88 mcg by mouth daily.       . Loratadine (CLARITIN) 10 MG CAPS Take 1 capsule by mouth daily.       [name] losartan (COZAAR) 50 MG tablet Take 0.5 tablets (25 mg total) by mouth 2 (two) times daily.  90 tablet  3  . meclizine (ANTIVERT) 25 MG tablet Take 25 mg by mouth 3 (three) times daily as needed.      . mexiletine (MEXITIL) 150 MG capsule TAKE 1 CAPSULE THREE TIMES A DAY  270 capsule  3  . Multiple Vitamin (MULITIVITAMIN WITH MINERALS) TABS Take 1 tablet by mouth daily.      . Omega-3 Fatty Acids (FISH OIL) 1000 MG CAPS Take 1,000 mg by mouth 2 (two) times daily.       . rosuvastatin (CRESTOR) 5 MG tablet Take 1 tablet every other day or as directed  90 tablet  3  . sotalol (BETAPACE) 80 MG tablet Take 1 tablet (80 mg total) by mouth 2 (two) times daily.  180 tablet  3  . losartan (COZAAR) 50  MG tablet TAKE 1 TABLET DAILY  90 tablet  3  . meclizine (ANTIVERT) 25 MG tablet Take 1 tablet (25 mg total) by mouth 3 (three) times daily as needed.  270 tablet  0  . warfarin (COUMADIN) 2.5 MG tablet TAKE AS DIRECTED BY ANTICOAGULATION CLINIC  100 tablet  1   No current facility-administered medications for this visit.    Allergies  Allergen Reactions  . Iohexol      Code: HIVES, Desc: hives w/ itching during cardiac cath '99, mult caths since w/ ? premeds, DR.G.HAYES REQUESTS 13 HR PRE MED//A.C.pt okay w/13 hr prep/mms, Onset Date: [id number]   . Prednisone Other (See Comments)    Hives    Patient Active Problem List   Diagnosis Date Noted  . Coronary artery disease 04/08/2011  . Anxiety 04/08/2011  . HTN (hypertension) 02/02/2011  . Biventricular implantable cardioverter-defibrillator in situ 11/03/2010   . Sinus bradycardia 11/03/2010  . Chronic anticoagulation 10/05/2010  . Cardiomyopathy, ischemic   . Chronic systolic heart failure   . Ventricular tachycardia   . LBBB 05/21/2010  . 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 ABDOMINAL AORTIC ANEURYSM 02/07/2009    History  Smoking status  . Former Smoker  Smokeless tobacco  . Former Neurosurgeon  . Quit date: 06/01/1987    History  Alcohol Use  . Yes    Comment: very rare    Family History  Problem Relation Age of Onset  . Stroke Mother   . Heart attack Father   . Heart attack Mother     Review of Systems: Constitutional: no fever chills diaphoresis or fatigue or change in weight.  Head and neck: no hearing loss, no epistaxis, no photophobia or visual disturbance. Respiratory: No cough, shortness of breath or wheezing. Cardiovascular: No chest pain peripheral edema, palpitations. Gastrointestinal: No abdominal distention, no abdominal pain, no change in bowel habits hematochezia or melena. Genitourinary: No dysuria, no frequency, no urgency, no nocturia. Musculoskeletal:No arthralgias, no back pain, no gait disturbance or myalgias. Neurological: No dizziness, no headaches, no numbness, no seizures, no syncope, no weakness, no tremors. Hematologic: No lymphadenopathy, no easy bruising. Psychiatric: No confusion, no hallucinations, no sleep disturbance.    Physical Exam: Filed Vitals:   02/15/13 0928  BP: 108/60  Pulse: 76   the general appearance reveals a well-developed well-nourished middle-aged gentleman in no distress.The head and neck exam reveals pupils equal and reactive.  Extraocular movements are full.  There is no scleral icterus.  The mouth and pharynx are normal.  The neck is supple.  The carotids reveal no bruits.  The jugular venous pressure is normal.  The  thyroid is not enlarged.  There is no lymphadenopathy.  The chest is clear to percussion and auscultation.  There are no rales or rhonchi.  Expansion of the chest is  symmetrical.  The precordium is quiet.  The first heart sound is normal.  The second heart sound is physiologically split.  There is no murmur gallop rub or click.  There is no abnormal lift or heave.  The abdomen is soft and nontender.  The bowel sounds are normal.  The liver and spleen are not enlarged.  There are no abdominal masses.  There are no abdominal bruits.  Extremities reveal good pedal pulses.  There is no phlebitis or edema.  There is no cyanosis or clubbing.  Strength is normal and symmetrical in all extremities.  There is no lateralizing weakness.  There are no sensory deficits.  The skin is warm  and dry.  There is no rash.      Assessment / Plan: Continue on same medication .  Recheck in 3 months for followup office visit and EKG.

## 2013-02-20 ENCOUNTER — Encounter: Payer: Self-pay | Admitting: *Deleted

## 2013-02-27 ENCOUNTER — Other Ambulatory Visit: Payer: Self-pay

## 2013-02-27 MED ORDER — CARVEDILOL 12.5 MG PO TABS: ORAL_TABLET | ORAL | Status: DC

## 2013-02-28 ENCOUNTER — Encounter: Payer: Self-pay | Admitting: Internal Medicine

## 2013-03-08 ENCOUNTER — Encounter: Payer: Self-pay | Admitting: Internal Medicine

## 2013-03-13 ENCOUNTER — Other Ambulatory Visit: Payer: Self-pay | Admitting: Family Medicine

## 2013-03-13 ENCOUNTER — Ambulatory Visit
Admission: RE | Admit: 2013-03-13 | Discharge: 2013-03-13 | Disposition: A | Discharge status: Home / Self Care | Payer: Medicare Other | Source: Ambulatory Visit | Attending: Family Medicine | Admitting: Family Medicine

## 2013-03-13 DIAGNOSIS — R319 Hematuria, unspecified: Secondary | ICD-10-CM

## 2013-03-15 ENCOUNTER — Ambulatory Visit (INDEPENDENT_AMBULATORY_CARE_PROVIDER_SITE_OTHER): Payer: Medicare Other | Admitting: *Deleted

## 2013-03-15 ENCOUNTER — Other Ambulatory Visit: Payer: Self-pay | Admitting: *Deleted

## 2013-03-15 DIAGNOSIS — I2589 Other forms of chronic ischemic heart disease: Secondary | ICD-10-CM

## 2013-03-15 DIAGNOSIS — I255 Ischemic cardiomyopathy: Secondary | ICD-10-CM

## 2013-03-15 DIAGNOSIS — Z7901 Long term (current) use of anticoagulants: Secondary | ICD-10-CM

## 2013-03-15 LAB — POCT INR: INR: 2.7

## 2013-03-15 MED ORDER — CARVEDILOL 12.5 MG PO TABS: ORAL_TABLET | ORAL | Status: DC

## 2013-03-15 MED ORDER — MECLIZINE HCL 25 MG PO TABS: 25.0000 mg | ORAL_TABLET | Freq: Three times a day (TID) | ORAL | Status: DC | PRN

## 2013-03-15 NOTE — Telephone Encounter (Signed)
Refilled as requested by patient

## 2013-03-19 ENCOUNTER — Ambulatory Visit (INDEPENDENT_AMBULATORY_CARE_PROVIDER_SITE_OTHER): Payer: Medicare Other | Admitting: Pharmacist

## 2013-03-19 DIAGNOSIS — Z7901 Long term (current) use of anticoagulants: Secondary | ICD-10-CM

## 2013-03-19 DIAGNOSIS — I255 Ischemic cardiomyopathy: Secondary | ICD-10-CM

## 2013-03-19 DIAGNOSIS — I2589 Other forms of chronic ischemic heart disease: Secondary | ICD-10-CM

## 2013-03-19 LAB — POCT INR: INR: 4.7

## 2013-03-26 ENCOUNTER — Ambulatory Visit (INDEPENDENT_AMBULATORY_CARE_PROVIDER_SITE_OTHER): Payer: Medicare Other | Admitting: *Deleted

## 2013-03-26 DIAGNOSIS — I255 Ischemic cardiomyopathy: Secondary | ICD-10-CM

## 2013-03-26 DIAGNOSIS — I2589 Other forms of chronic ischemic heart disease: Secondary | ICD-10-CM

## 2013-03-26 DIAGNOSIS — Z7901 Long term (current) use of anticoagulants: Secondary | ICD-10-CM

## 2013-03-26 LAB — POCT INR: INR: 2.2

## 2013-04-05 ENCOUNTER — Other Ambulatory Visit: Payer: Self-pay

## 2013-04-09 ENCOUNTER — Ambulatory Visit (INDEPENDENT_AMBULATORY_CARE_PROVIDER_SITE_OTHER): Payer: Medicare Other | Admitting: General Practice

## 2013-04-09 DIAGNOSIS — I2589 Other forms of chronic ischemic heart disease: Secondary | ICD-10-CM

## 2013-04-09 DIAGNOSIS — Z7901 Long term (current) use of anticoagulants: Secondary | ICD-10-CM

## 2013-04-09 DIAGNOSIS — I255 Ischemic cardiomyopathy: Secondary | ICD-10-CM

## 2013-04-09 LAB — POCT INR: INR: 2.1

## 2013-04-13 ENCOUNTER — Telehealth: Payer: Self-pay | Admitting: Cardiology

## 2013-04-16 NOTE — Telephone Encounter (Signed)
Received request from Nurse fax box, documents faxed for surgical clearance. To: C. Mikey Bussing, D.D.S., M.S., P.A. [facility] Fax number: [phone] Attention:

## 2013-04-24 ENCOUNTER — Other Ambulatory Visit: Payer: Self-pay | Admitting: *Deleted

## 2013-04-24 MED ORDER — MECLIZINE HCL 25 MG PO TABS: 25.0000 mg | ORAL_TABLET | Freq: Three times a day (TID) | ORAL | Status: DC | PRN

## 2013-05-01 ENCOUNTER — Encounter: Payer: Self-pay | Admitting: Internal Medicine

## 2013-05-01 ENCOUNTER — Ambulatory Visit (INDEPENDENT_AMBULATORY_CARE_PROVIDER_SITE_OTHER): Payer: Medicare Other | Admitting: Internal Medicine

## 2013-05-01 ENCOUNTER — Ambulatory Visit (INDEPENDENT_AMBULATORY_CARE_PROVIDER_SITE_OTHER): Payer: Medicare Other | Admitting: General Practice

## 2013-05-01 VITALS — BP 121/79 | HR 84 | Ht 68.0 in | Wt 175.1 lb

## 2013-05-01 DIAGNOSIS — E7800 Pure hypercholesterolemia, unspecified: Secondary | ICD-10-CM

## 2013-05-01 DIAGNOSIS — I5022 Chronic systolic (congestive) heart failure: Secondary | ICD-10-CM

## 2013-05-01 DIAGNOSIS — Z95810 Presence of automatic (implantable) cardiac defibrillator: Secondary | ICD-10-CM

## 2013-05-01 DIAGNOSIS — I255 Ischemic cardiomyopathy: Secondary | ICD-10-CM

## 2013-05-01 DIAGNOSIS — I2589 Other forms of chronic ischemic heart disease: Secondary | ICD-10-CM

## 2013-05-01 DIAGNOSIS — Z7901 Long term (current) use of anticoagulants: Secondary | ICD-10-CM

## 2013-05-01 DIAGNOSIS — I472 Ventricular tachycardia: Secondary | ICD-10-CM

## 2013-05-01 LAB — MDC_IDC_ENUM_SESS_TYPE_INCLINIC: Implantable Pulse Generator Serial Number: 828324

## 2013-05-01 MED ORDER — ROSUVASTATIN CALCIUM 5 MG PO TABS: ORAL_TABLET | ORAL | Status: DC

## 2013-05-01 NOTE — Patient Instructions (Addendum)
Your physician has recommended you make the following change in your medication:  1) Change how you take your statin - take Crestor twice a week  Remote monitoring is used to monitor your Pacemaker of ICD from home. This monitoring reduces the number of office visits required to check your device to one time per year. It allows [place] to keep an eye on the functioning of your device to ensure it is working properly. You are scheduled for a device check from home on 08/01/2013. You may send your transmission at any time that day. If you have a wireless device, the transmission will be sent automatically. After your physician reviews your transmission, you will receive a postcard with your next transmission date.  Your physician wants you to follow-up in: one year with Dr. Graciela Husbands.  You will receive a reminder letter in the mail two months in advance. If you don't receive a letter, please call our office to schedule the follow-up appointment.   (Patient is following up with PCP next week - Mg lab to be followed there.)

## 2013-05-01 NOTE — Assessment & Plan Note (Signed)
Will decrrease statin and see if improves leg pain

## 2013-05-01 NOTE — Assessment & Plan Note (Signed)
No intercurrent Ventricular tachycardia  

## 2013-05-01 NOTE — Progress Notes (Signed)
[email or web address]      Patient Care Team: Julian Hy, MD as PCP - General (Endocrinology)   HPI  [name] is a 72 y.o. male Is seen in followup for recurrent ventricular tachycardia. He has been on multiple medications in the past, and underwent catheter ablation in the setting of VT storm September 2012. He had post ablation ventricular tachycardia and was started on amiodarone which had previously not been used because of concurrent hypothyroidism.   Because of recurrent ventricular tachycardia he was transferred to [facility] for incessant VT and underwent an ablation complicated by pericardial perforation and subsequent pericarditis.   He was seen a couple of weeks after the last visit in the emergency room with frequent ventricular ectopy almost in a pattern of accelerated idioventricular rhythm and we put him on mexiletine. This has been associated with a decrease in his ventricular burden from 38% to 6% and a marked improvement in his overall symptom complex.  His energy is improving.  He then had recurrent ventricular tachycardia , which was relatively slow at 110-130 beats per minute and underwent repeat catheter ablation in March 2103 by Dr. Johney Frame   Ischemic cardiomyopathy with ejection fraction of 25-30% by echo in December 2000 well with multiple wall motion abnormalities ejection fraction 30%   The patient denies chest pain, shortness of breath, nocturnal dyspnea, orthopnea or peripheral edema. There have been no palpitations, lightheadedness or syncope.   Issues with balance. He also notes aching in his proximal thigh is a type a   Past Medical History  Diagnosis Date  . CAD (coronary artery disease) 1999    Remote anterolater and apical MI, cath 2011 patent stents RCA/LCx  LAD w/o obstruction  . Cardiomyopathy, ischemic     EF 20-25%  echo 2012  . Chronic systolic congestive heart failure   . ICD (implantable cardiac defibrillator) in place     [name]  .  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 Abdominal aortic aneurysm 2009    Repaired with endovascular stent  . PVD (peripheral vascular disease)     Has occluded innominate vein  . Myocardial infarction   . Hypertension   . Ventricular tachycardia     recurrent slow VT at 100-110 bpm,  Rx mexilitene/sotalol  . Left bundle branch block   . Systolic CHF, chronic   . Arthritis   . Gynecomastia     2/2 spironolactone    Past Surgical History  Procedure Laterality Date  . Icd  Feb 2012    Change out with LV lead placed with tunneling from the right  to the left side  . Endovascular stent insertion      for AAA  . Cardiac catheterization      x2  . Pacemaker insertion    . Ventricular ablation surgery      s/p prior EPS and ablation at [facility] 9/12, [facility] 11/12, and most recent at [facility] 08/23/11  . Tonsillectomy      Current Outpatient Prescriptions  Medication Sig Dispense Refill  . ALPRAZolam (XANAX) 0.25 MG tablet Take 1 tablet (0.25 mg total) by mouth 4 (four) times daily as needed. For anxiety  360 tablet  1  . Ascorbic Acid (VITAMIN C) 500 MG tablet Take 500 mg by mouth daily.        [name] aspirin 81 MG tablet Take 81 mg by mouth daily.      . carvedilol (COREG) 12.5 MG tablet Take one  tablet by mouth twice daily  180 tablet  3  . eplerenone (INSPRA) 25 MG tablet Take 25 mg by mouth as directed. 1/2 tablet daily      . furosemide (LASIX) 20 MG tablet Take 20 mg by mouth as needed. Prn weight gain of 3 pounds overnight      . levothyroxine (SYNTHROID, LEVOTHROID) 88 MCG tablet Take 88 mcg by mouth daily.       . Loratadine (CLARITIN) 10 MG CAPS Take 1 capsule by mouth daily.       [name] losartan (COZAAR) 50 MG tablet Take 0.5 tablets (25 mg total) by mouth 2 (two) times daily.  90 tablet  3  . meclizine (ANTIVERT) 25 MG tablet Take 1 tablet (25 mg total) by mouth 3 (three) times daily as needed.  180 tablet  3  . mexiletine (MEXITIL) 150 MG capsule TAKE 1 CAPSULE THREE TIMES A  DAY  270 capsule  3  . Multiple Vitamin (MULITIVITAMIN WITH MINERALS) TABS Take 1 tablet by mouth daily.      . Omega-3 Fatty Acids (FISH OIL) 1000 MG CAPS Take 1,000 mg by mouth 2 (two) times daily.       . rosuvastatin (CRESTOR) 5 MG tablet Take 1 tablet every other day or as directed  90 tablet  3  . sotalol (BETAPACE) 80 MG tablet Take 1 tablet (80 mg total) by mouth 2 (two) times daily.  180 tablet  3  . warfarin (COUMADIN) 2.5 MG tablet TAKE AS DIRECTED BY ANTICOAGULATION CLINIC  100 tablet  1   No current facility-administered medications for this visit.    Allergies  Allergen Reactions  . Iohexol      Code: HIVES, Desc: hives w/ itching during cardiac cath '99, mult caths since w/ ? premeds, DR.G.HAYES REQUESTS 13 HR PRE MED//A.C.pt okay w/13 hr prep/mms, Onset Date: [id number]   . Prednisone Other (See Comments)    Hives    Review of Systems negative except from HPI and PMH  Physical Exam BP 121/79  Pulse 142  Ht 5\' 8"  (1.727 m)  Wt 175 lb 1.9 oz (79.434 kg)  BMI 26.63 kg/m2 Well developed and well nourished in no acute distress HENT normal E scleral and icterus clear Neck Supple JVP 7 with HJR  carotids brisk and full Clear to ausculation  Regular rate and rhythm, no murmurs gallops or rub Soft with active bowel sounds No clubbing cyanosis none Edema Alert and oriented, grossly normal motor and sensory function Skin Warm and Dry  ECG demonstrates AV pacing at a rate consistent left ventricular activation  Assessment and  Plan

## 2013-05-01 NOTE — Assessment & Plan Note (Signed)
Stable cojntinue current mede meds

## 2013-05-01 NOTE — Assessment & Plan Note (Signed)
The patient's device was interrogated.  The information was reviewed. No changes were made in the programming.    

## 2013-05-17 ENCOUNTER — Encounter: Payer: Self-pay | Admitting: Cardiology

## 2013-05-17 ENCOUNTER — Ambulatory Visit (INDEPENDENT_AMBULATORY_CARE_PROVIDER_SITE_OTHER): Payer: Medicare Other | Admitting: Cardiology

## 2013-05-17 VITALS — BP 121/72 | HR 71 | Ht 68.0 in | Wt 173.4 lb

## 2013-05-17 DIAGNOSIS — I5022 Chronic systolic (congestive) heart failure: Secondary | ICD-10-CM

## 2013-05-17 DIAGNOSIS — E7800 Pure hypercholesterolemia, unspecified: Secondary | ICD-10-CM

## 2013-05-17 DIAGNOSIS — I255 Ischemic cardiomyopathy: Secondary | ICD-10-CM

## 2013-05-17 DIAGNOSIS — I472 Ventricular tachycardia: Secondary | ICD-10-CM

## 2013-05-17 DIAGNOSIS — E785 Hyperlipidemia, unspecified: Secondary | ICD-10-CM

## 2013-05-17 DIAGNOSIS — I2510 Atherosclerotic heart disease of native coronary artery without angina pectoris: Secondary | ICD-10-CM

## 2013-05-17 DIAGNOSIS — I2589 Other forms of chronic ischemic heart disease: Secondary | ICD-10-CM

## 2013-05-17 NOTE — Assessment & Plan Note (Signed)
The patient has not had any recurrent chest pain or angina. 

## 2013-05-17 NOTE — Assessment & Plan Note (Signed)
The patient has had no recurrent symptoms of heart failure.  He is being extremely careful with dietary salt now.

## 2013-05-17 NOTE — Assessment & Plan Note (Signed)
The patient has had no further ventricular tachycardia.  He last saw Dr. Graciela Husbands about 2 weeks ago.

## 2013-05-17 NOTE — Assessment & Plan Note (Signed)
The patient's lipids are followed by Dr. Evlyn Kanner.  The patient's leg aching has improved on lower dose of Crestor just twice a week

## 2013-05-17 NOTE — Progress Notes (Signed)
[name] Date of Birth:  Aug 06, 1940 [address] [place], [place]  [address] [phone]         Fax   [phone]  History of Present Illness: This pleasant 72 year old gentleman is seen for a three-month followup office visit. He has a history of a remote anterior wall myocardial infarction. He has an ischemic cardiomyopathy. He has had a defibrillator for ventricular tachycardia. He has been followed closely by Dr. Graciela Husbands and Dr. Johney Frame. 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 Subsequently he had recurrent ventricular tachycardia and was transferred to [facility] for incessant VT and underwent a second ablation procedure complicated by pericardial perforation and pericarditis. He underwent a third catheter VT ablation procedure in March 2013 by Dr. Johney Frame and since then has felt well. He has not been aware of any palpitations. He has not been aware of any shocks from his ICD. He does have a known ischemic cardiomyopathy with ejection fraction of 25-30% by echocardiogram in February 2012. Since last visit  he had some problems with fluid overload [place] and responded to increased diuretic and to dietary salt restriction.  About 3 months ago he had an episode of palpitations lasting about 2 minutes.  His heart rate was noted to be 113 and Dr. Graciela Husbands analyzed the rhythm from the defibrillator and diagnosed as a very slow ventricular tachycardia.  There were no changes made in his regimen.  He has had no recurrence.. he has felt well since last visit.  He has had some mild aching in his legs which has improved since his Crestor dose was reduced to just twice a week.   Current Outpatient Prescriptions  Medication Sig Dispense Refill  . ALPRAZolam (XANAX) 0.25 MG tablet Take 1 tablet (0.25 mg total) by mouth 4 (four) times daily as needed. For anxiety  360 tablet  1  . Ascorbic Acid (VITAMIN C) 500 MG  tablet Take 500 mg by mouth daily.        [name] aspirin 81 MG tablet Take 81 mg by mouth daily.      . carvedilol (COREG) 12.5 MG tablet Take one tablet by mouth twice daily  180 tablet  3  . eplerenone (INSPRA) 25 MG tablet Take 25 mg by mouth as directed. 1/2 tablet daily      . furosemide (LASIX) 20 MG tablet Take 20 mg by mouth as needed. Prn weight gain of 3 pounds overnight      . levothyroxine (SYNTHROID, LEVOTHROID) 88 MCG tablet Take 88 mcg by mouth daily. Take 1 and 1/2 pill on Thursday      . Loratadine (CLARITIN) 10 MG CAPS Take 1 capsule by mouth daily.       [name] losartan (COZAAR) 50 MG tablet Take 0.5 tablets (25 mg total) by mouth 2 (two) times daily.  90 tablet  3  . meclizine (ANTIVERT) 25 MG tablet Take 1 tablet (25 mg total) by mouth 3 (three) times daily as needed.  180 tablet  3  . mexiletine (MEXITIL) 150 MG capsule TAKE 1 CAPSULE THREE TIMES A DAY  270 capsule  3  . Multiple Vitamin (MULITIVITAMIN WITH MINERALS) TABS Take 1 tablet by mouth daily.      . Omega-3 Fatty Acids (FISH OIL) 1000 MG CAPS Take 1,000 mg by mouth 2 (two) times daily.       . rosuvastatin (CRESTOR) 5 MG tablet Take 1  tablet twice a week or as directed  12 tablet  6  . sotalol (BETAPACE) 80 MG tablet Take 1 tablet (80 mg total) by mouth 2 (two) times daily.  180 tablet  3  . warfarin (COUMADIN) 2.5 MG tablet TAKE AS DIRECTED BY ANTICOAGULATION CLINIC  100 tablet  1   No current facility-administered medications for this visit.    Allergies  Allergen Reactions  . Iohexol      Code: HIVES, Desc: hives w/ itching during cardiac cath '99, mult caths since w/ ? premeds, DR.G.HAYES REQUESTS 13 HR PRE MED//A.C.pt okay w/13 hr prep/mms, Onset Date: 29562130   . Prednisone Other (See Comments)    Hives    Patient Active Problem List   Diagnosis Date Noted  . Dyslipidemia 02/15/2013  . Coronary artery disease 04/08/2011  . Anxiety 04/08/2011  . HTN (hypertension) 02/02/2011  . Biventricular implantable  cardioverter-defibrillator in situ 11/03/2010  . Sinus bradycardia 11/03/2010  . Chronic anticoagulation 10/05/2010  . Cardiomyopathy, ischemic   . Chronic systolic heart failure   . Ventricular tachycardia   . LBBB 05/21/2010  . 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 ABDOMINAL AORTIC ANEURYSM 02/07/2009    History  Smoking status  . Former Smoker  Smokeless tobacco  . Former Neurosurgeon  . Quit date: 06/01/1987    History  Alcohol Use  . Yes    Comment: very rare    Family History  Problem Relation Age of Onset  . Stroke Mother   . Heart attack Father   . Heart attack Mother     Review of Systems: Constitutional: no fever chills diaphoresis or fatigue or change in weight.  Head and neck: no hearing loss, no epistaxis, no photophobia or visual disturbance. Respiratory: No cough, shortness of breath or wheezing. Cardiovascular: No chest pain peripheral edema, palpitations. Gastrointestinal: No abdominal distention, no abdominal pain, no change in bowel habits hematochezia or melena. Genitourinary: No dysuria, no frequency, no urgency, no nocturia. Musculoskeletal:No arthralgias, no back pain, no gait disturbance or myalgias. Neurological: No dizziness, no headaches, no numbness, no seizures, no syncope, no weakness, no tremors. Hematologic: No lymphadenopathy, no easy bruising. Psychiatric: No confusion, no hallucinations, no sleep disturbance.    Physical Exam: Filed Vitals:   05/17/13 1004  BP: 121/72  Pulse: 71   the general appearance reveals a well-developed well-nourished middle-aged gentleman in no distress.The head and neck exam reveals pupils equal and reactive.  Extraocular movements are full.  There is no scleral icterus.  The mouth and pharynx are normal.  The neck is supple.  The carotids reveal no bruits.  The jugular venous pressure is normal.  The  thyroid is not enlarged.  There is no lymphadenopathy.  The chest is clear to percussion and auscultation.  There are no  rales or rhonchi.  Expansion of the chest is symmetrical.  The precordium is quiet.  The first heart sound is normal.  The second heart sound is physiologically split.  There is no murmur gallop rub or click.  There is no abnormal lift or heave.  The abdomen is soft and nontender.  The bowel sounds are normal.  The liver and spleen are not enlarged.  There are no abdominal masses.  There are no abdominal bruits.  Extremities reveal good pedal pulses.  There is no phlebitis or edema.  There is no cyanosis or clubbing.  Strength is normal and symmetrical in all extremities.  There is no lateralizing weakness.  There are no sensory deficits.  The  skin is warm and dry.  There is no rash.      Assessment / Plan: The patient is doing very well.  Continue on same medication .  Recheck in 3 months for followup office visit.

## 2013-05-17 NOTE — Patient Instructions (Signed)
Your physician recommends that you continue on your current medications as directed. Please refer to the Current Medication list given to you today.  Your physician recommends that you schedule a follow-up appointment in: 3 month ov 

## 2013-06-04 ENCOUNTER — Telehealth: Payer: Self-pay | Admitting: Cardiology

## 2013-06-04 NOTE — Telephone Encounter (Signed)
Walk In pt Form " Med List" Dropped Off gave to [facility]

## 2013-06-05 ENCOUNTER — Other Ambulatory Visit: Payer: Self-pay | Admitting: *Deleted

## 2013-06-05 DIAGNOSIS — F419 Anxiety disorder, unspecified: Secondary | ICD-10-CM

## 2013-06-05 MED ORDER — ALPRAZOLAM 0.25 MG PO TABS: 0.2500 mg | ORAL_TABLET | Freq: Four times a day (QID) | ORAL | Status: DC | PRN

## 2013-06-05 NOTE — Telephone Encounter (Signed)
Patient requested Xanax 0.25 mg four times a day be sent to [facility]. Printed and waiting for  Dr. Mare Ferrari to sign, will fax when completed

## 2013-06-12 ENCOUNTER — Ambulatory Visit (INDEPENDENT_AMBULATORY_CARE_PROVIDER_SITE_OTHER): Payer: Medicare Other

## 2013-06-12 DIAGNOSIS — I2589 Other forms of chronic ischemic heart disease: Secondary | ICD-10-CM

## 2013-06-12 DIAGNOSIS — Z7901 Long term (current) use of anticoagulants: Secondary | ICD-10-CM

## 2013-06-12 DIAGNOSIS — I255 Ischemic cardiomyopathy: Secondary | ICD-10-CM

## 2013-06-12 LAB — POCT INR: INR: 2

## 2013-06-17 ENCOUNTER — Other Ambulatory Visit: Payer: Self-pay | Admitting: Cardiology

## 2013-06-18 ENCOUNTER — Encounter: Payer: Self-pay | Admitting: Internal Medicine

## 2013-07-12 ENCOUNTER — Encounter: Payer: Self-pay | Admitting: Internal Medicine

## 2013-07-12 ENCOUNTER — Encounter: Payer: Medicare Other | Admitting: Internal Medicine

## 2013-07-12 ENCOUNTER — Ambulatory Visit (INDEPENDENT_AMBULATORY_CARE_PROVIDER_SITE_OTHER): Payer: Medicare Other | Admitting: *Deleted

## 2013-07-12 DIAGNOSIS — I255 Ischemic cardiomyopathy: Secondary | ICD-10-CM

## 2013-07-12 DIAGNOSIS — I2589 Other forms of chronic ischemic heart disease: Secondary | ICD-10-CM

## 2013-07-12 LAB — MDC_IDC_ENUM_SESS_TYPE_INCLINIC: MDC IDC PG SERIAL: 828324

## 2013-07-12 NOTE — Progress Notes (Signed)
Changed V=A criteria from IF ANY to IF ALL due to episodes on 06-16-13. Follow up as planned.

## 2013-07-18 ENCOUNTER — Encounter: Payer: Self-pay | Admitting: *Deleted

## 2013-07-25 ENCOUNTER — Ambulatory Visit (INDEPENDENT_AMBULATORY_CARE_PROVIDER_SITE_OTHER): Payer: Medicare Other | Admitting: Pharmacist

## 2013-07-25 DIAGNOSIS — I255 Ischemic cardiomyopathy: Secondary | ICD-10-CM

## 2013-07-25 DIAGNOSIS — Z7901 Long term (current) use of anticoagulants: Secondary | ICD-10-CM

## 2013-07-25 DIAGNOSIS — I2589 Other forms of chronic ischemic heart disease: Secondary | ICD-10-CM

## 2013-07-25 LAB — POCT INR: INR: 2.1

## 2013-08-24 ENCOUNTER — Encounter: Payer: Self-pay | Admitting: Cardiology

## 2013-08-24 ENCOUNTER — Ambulatory Visit (INDEPENDENT_AMBULATORY_CARE_PROVIDER_SITE_OTHER): Payer: Medicare Other | Admitting: Cardiology

## 2013-08-24 VITALS — BP 128/74 | HR 72 | Ht 68.0 in | Wt 177.0 lb

## 2013-08-24 DIAGNOSIS — I10 Essential (primary) hypertension: Secondary | ICD-10-CM

## 2013-08-24 DIAGNOSIS — E7800 Pure hypercholesterolemia, unspecified: Secondary | ICD-10-CM

## 2013-08-24 DIAGNOSIS — I472 Ventricular tachycardia, unspecified: Secondary | ICD-10-CM

## 2013-08-24 DIAGNOSIS — Z7901 Long term (current) use of anticoagulants: Secondary | ICD-10-CM

## 2013-08-24 DIAGNOSIS — I2510 Atherosclerotic heart disease of native coronary artery without angina pectoris: Secondary | ICD-10-CM

## 2013-08-24 DIAGNOSIS — I4729 Other ventricular tachycardia: Secondary | ICD-10-CM

## 2013-08-24 DIAGNOSIS — I2589 Other forms of chronic ischemic heart disease: Secondary | ICD-10-CM

## 2013-08-24 DIAGNOSIS — I255 Ischemic cardiomyopathy: Secondary | ICD-10-CM

## 2013-08-24 MED ORDER — WARFARIN SODIUM 2.5 MG PO TABS: ORAL_TABLET | ORAL | Status: DC

## 2013-08-24 NOTE — Progress Notes (Signed)
[name] Date of Birth:  1940/06/22 [address] [place], [place]  [address] [phone]         Fax   [phone]  History of Present Illness: This pleasant 73 year old gentleman is seen for a three-month followup office visit. He has a history of a remote anterior wall myocardial infarction. He has an ischemic cardiomyopathy. He has had a defibrillator for ventricular tachycardia. He has been followed closely by Dr. Caryl Comes and Dr. Rayann Heman. 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 Subsequently he had recurrent ventricular tachycardia and was transferred to [facility] for incessant VT and underwent a second ablation procedure complicated by pericardial perforation and pericarditis. He underwent a third catheter VT ablation procedure in March 2013 by Dr. Rayann Heman and since then has felt well. He has not been aware of any palpitations. He has not been aware of any shocks from his ICD. He does have a known ischemic cardiomyopathy with ejection fraction of 25-30% by echocardiogram in February 2012.  In autumn 2014  he had some problems with fluid overload [place] and responded to increased diuretic and to dietary salt restriction.  About 3 months ago he had an episode of palpitations lasting about 2 minutes.  His heart rate was noted to be 113 and Dr. Caryl Comes analyzed the rhythm from the defibrillator and diagnosed as a very slow ventricular tachycardia.  There were no changes made in his regimen.  He has had no recurrence.. he has felt well since last visit.  He has had some mild aching in his legs which has improved since his Crestor dose was reduced to just twice a week.  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  Current Outpatient Prescriptions  Medication Sig Dispense Refill  . ALPRAZolam (XANAX) 0.25 MG tablet Take 1 tablet (0.25 mg total) by mouth 4  (four) times daily as needed. For anxiety  360 tablet  1  . Ascorbic Acid (VITAMIN C) 500 MG tablet Take 500 mg by mouth daily.        [name] aspirin 81 MG tablet Take 81 mg by mouth daily.      . carvedilol (COREG) 12.5 MG tablet Take one tablet by mouth twice daily  180 tablet  3  . eplerenone (INSPRA) 25 MG tablet Take 25 mg by mouth as directed. 1/2 tablet daily      . furosemide (LASIX) 20 MG tablet Take 20 mg by mouth as needed. Prn weight gain of 3 pounds overnight      . levothyroxine (SYNTHROID, LEVOTHROID) 88 MCG tablet Take 88 mcg by mouth daily. Take 1 and 1/2 pill on Thursday      . Loratadine (CLARITIN) 10 MG CAPS Take 1 capsule by mouth daily.       [name] losartan (COZAAR) 50 MG tablet Take 0.5 tablets (25 mg total) by mouth 2 (two) times daily.  90 tablet  3  . meclizine (ANTIVERT) 25 MG tablet Take 1 tablet (25 mg total) by mouth 3 (three) times daily as needed.  180 tablet  3  . mexiletine (MEXITIL) 150 MG capsule TAKE 1 CAPSULE THREE TIMES A DAY  270 capsule  3  . Multiple Vitamin (MULITIVITAMIN WITH MINERALS) TABS Take 1 tablet by mouth daily.      . Omega-3 Fatty Acids (FISH OIL) 1000 MG CAPS Take 1,000  mg by mouth 2 (two) times daily.       . rosuvastatin (CRESTOR) 5 MG tablet Take 1 tablet twice a week or as directed  12 tablet  6  . sotalol (BETAPACE) 80 MG tablet TAKE 1 TABLET TWO TIMES A DAY  90 tablet  1  . warfarin (COUMADIN) 2.5 MG tablet TAKE AS DIRECTED BY ANTICOAGULATION CLINIC  100 tablet  1   No current facility-administered medications for this visit.    Allergies  Allergen Reactions  . Iohexol      Code: HIVES, Desc: hives w/ itching during cardiac cath '99, mult caths since w/ ? premeds, DR.G.HAYES REQUESTS 13 HR PRE MED//A.C.pt okay w/13 hr prep/mms, Onset Date: [id number]   . Prednisone Other (See Comments)    Hives    Patient Active Problem List   Diagnosis Date Noted  . Dyslipidemia 02/15/2013  . Coronary artery disease 04/08/2011  . Anxiety 04/08/2011    . HTN (hypertension) 02/02/2011  . Biventricular implantable cardioverter-defibrillator in situ 11/03/2010  . Sinus bradycardia 11/03/2010  . Chronic anticoagulation 10/05/2010  . Cardiomyopathy, ischemic   . Chronic systolic heart failure   . Ventricular tachycardia   . LBBB 05/21/2010  . 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 ABDOMINAL AORTIC ANEURYSM 02/07/2009    History  Smoking status  . Former Smoker  Smokeless tobacco  . Former Systems developer  . Quit date: 06/01/1987    History  Alcohol Use  . Yes    Comment: very rare    Family History  Problem Relation Age of Onset  . Stroke Mother   . Heart attack Father   . Heart attack Mother     Review of Systems: Constitutional: no fever chills diaphoresis or fatigue or change in weight.  Head and neck: no hearing loss, no epistaxis, no photophobia or visual disturbance. Respiratory: No cough, shortness of breath or wheezing. Cardiovascular: No chest pain peripheral edema, palpitations. Gastrointestinal: No abdominal distention, no abdominal pain, no change in bowel habits hematochezia or melena. Genitourinary: No dysuria, no frequency, no urgency, no nocturia. Musculoskeletal:No arthralgias, no back pain, no gait disturbance or myalgias. Neurological: No dizziness, no headaches, no numbness, no seizures, no syncope, no weakness, no tremors. Hematologic: No lymphadenopathy, no easy bruising. Psychiatric: No confusion, no hallucinations, no sleep disturbance.    Physical Exam: Filed Vitals:   08/24/13 1111  BP: 128/74  Pulse: 72   the general appearance reveals a well-developed well-nourished middle-aged gentleman in no distress.The head and neck exam reveals pupils equal and reactive.  Extraocular movements are full.  There is no scleral icterus.  The mouth and pharynx are normal.  The neck is supple.  The carotids reveal no bruits.  The jugular venous pressure is normal.  The  thyroid is not enlarged.  There is no lymphadenopathy.   The chest is clear to percussion and auscultation.  There are no rales or rhonchi.  Expansion of the chest is symmetrical.  The precordium is quiet.  The first heart sound is normal.  The second heart sound is physiologically split.  There is no murmur gallop rub or click.  There is no abnormal lift or heave.  The abdomen is soft and nontender.  The bowel sounds are normal.  The liver and spleen are not enlarged.  There are no abdominal masses.  There are no abdominal bruits.  Extremities reveal good pedal pulses.  There is no phlebitis or edema.  There is no cyanosis or clubbing.  Strength is normal and symmetrical  in all extremities.  There is no lateralizing weakness.  There are no sensory deficits.  The skin is warm and dry.  There is no rash.      Assessment / Plan: The patient is doing very well.  Continue on same medication .  Recheck in 4 months for followup office visit and EKG. He will be talking with his physicians at [facility] regarding the varicose veins in his right leg.

## 2013-08-24 NOTE — Patient Instructions (Signed)
Your physician recommends that you continue on your current medications as directed. Please refer to the Current Medication list given to you today.  Your physician wants you to follow-up in: 4 months ov/ekg You will receive a reminder letter in the mail two months in advance. If you don't receive a letter, please call our office to schedule the follow-up appointment.

## 2013-08-24 NOTE — Assessment & Plan Note (Signed)
The patient has not been aware of any recurrent tachycardia or palpitations.

## 2013-08-24 NOTE — Assessment & Plan Note (Signed)
Blood pressure has been stable at home.  No dizziness or syncope.  No palpitations.

## 2013-08-24 NOTE — Assessment & Plan Note (Signed)
The patient has had no recurrence of angina.

## 2013-09-05 ENCOUNTER — Ambulatory Visit (INDEPENDENT_AMBULATORY_CARE_PROVIDER_SITE_OTHER): Payer: Medicare Other | Admitting: Pharmacist

## 2013-09-05 DIAGNOSIS — I2589 Other forms of chronic ischemic heart disease: Secondary | ICD-10-CM

## 2013-09-05 DIAGNOSIS — Z7901 Long term (current) use of anticoagulants: Secondary | ICD-10-CM

## 2013-09-05 DIAGNOSIS — I255 Ischemic cardiomyopathy: Secondary | ICD-10-CM

## 2013-09-05 LAB — POCT INR: INR: 1.9

## 2013-10-02 ENCOUNTER — Other Ambulatory Visit: Payer: Self-pay | Admitting: Cardiology

## 2013-10-10 ENCOUNTER — Ambulatory Visit (INDEPENDENT_AMBULATORY_CARE_PROVIDER_SITE_OTHER): Payer: Medicare Other | Admitting: *Deleted

## 2013-10-10 DIAGNOSIS — Z5181 Encounter for therapeutic drug level monitoring: Secondary | ICD-10-CM | POA: Insufficient documentation

## 2013-10-10 DIAGNOSIS — Z7901 Long term (current) use of anticoagulants: Secondary | ICD-10-CM

## 2013-10-10 DIAGNOSIS — I2589 Other forms of chronic ischemic heart disease: Secondary | ICD-10-CM

## 2013-10-10 DIAGNOSIS — I255 Ischemic cardiomyopathy: Secondary | ICD-10-CM

## 2013-10-10 LAB — POCT INR: INR: 2.2

## 2013-10-12 ENCOUNTER — Ambulatory Visit (INDEPENDENT_AMBULATORY_CARE_PROVIDER_SITE_OTHER): Payer: Medicare Other | Admitting: *Deleted

## 2013-10-12 ENCOUNTER — Encounter: Payer: Self-pay | Admitting: Internal Medicine

## 2013-10-12 DIAGNOSIS — I255 Ischemic cardiomyopathy: Secondary | ICD-10-CM

## 2013-10-12 DIAGNOSIS — I5022 Chronic systolic (congestive) heart failure: Secondary | ICD-10-CM

## 2013-10-12 DIAGNOSIS — I4729 Other ventricular tachycardia: Secondary | ICD-10-CM

## 2013-10-12 DIAGNOSIS — I472 Ventricular tachycardia, unspecified: Secondary | ICD-10-CM

## 2013-10-12 DIAGNOSIS — I2589 Other forms of chronic ischemic heart disease: Secondary | ICD-10-CM

## 2013-10-12 DIAGNOSIS — I447 Left bundle-branch block, unspecified: Secondary | ICD-10-CM

## 2013-10-12 DIAGNOSIS — I498 Other specified cardiac arrhythmias: Secondary | ICD-10-CM

## 2013-10-12 DIAGNOSIS — R001 Bradycardia, unspecified: Secondary | ICD-10-CM

## 2013-10-12 LAB — MDC_IDC_ENUM_SESS_TYPE_REMOTE
Battery Remaining Longevity: 1.7
Brady Statistic RA Percent Paced: 99 %
Brady Statistic RV Percent Paced: 99 %
HIGH POWER IMPEDANCE MEASURED VALUE: 57 Ohm
Implantable Pulse Generator Serial Number: 828324
Lead Channel Impedance Value: 410 Ohm
Lead Channel Sensing Intrinsic Amplitude: 11.8 mV
Lead Channel Setting Pacing Amplitude: 2.5 V
Lead Channel Setting Pacing Pulse Width: 0.5 ms
Lead Channel Setting Sensing Sensitivity: 0.5 mV
MDC IDC MSMT LEADCHNL LV IMPEDANCE VALUE: 950 Ohm
MDC IDC MSMT LEADCHNL RA SENSING INTR AMPL: 2.3 mV
MDC IDC MSMT LEADCHNL RV IMPEDANCE VALUE: 350 Ohm
MDC IDC SET LEADCHNL LV PACING PULSEWIDTH: 0.5 ms
MDC IDC SET LEADCHNL RA PACING AMPLITUDE: 2.5 V
MDC IDC SET LEADCHNL RV PACING AMPLITUDE: 2.5 V
Zone Setting Detection Interval: 310 ms
Zone Setting Detection Interval: 570 ms

## 2013-10-12 NOTE — Progress Notes (Signed)
Remote ICD transmission.   

## 2013-10-30 ENCOUNTER — Other Ambulatory Visit: Payer: Self-pay | Admitting: Cardiology

## 2013-11-05 ENCOUNTER — Telehealth: Payer: Self-pay | Admitting: Cardiology

## 2013-11-05 NOTE — Telephone Encounter (Signed)
Walk In pt Form " Sealed Letter" gave to [facility]

## 2013-11-07 ENCOUNTER — Other Ambulatory Visit: Payer: Self-pay | Admitting: Cardiology

## 2013-11-08 ENCOUNTER — Other Ambulatory Visit: Payer: Self-pay | Admitting: *Deleted

## 2013-11-08 DIAGNOSIS — F419 Anxiety disorder, unspecified: Secondary | ICD-10-CM

## 2013-11-08 MED ORDER — ALPRAZOLAM 0.25 MG PO TABS: 0.2500 mg | ORAL_TABLET | Freq: Four times a day (QID) | ORAL | Status: DC | PRN

## 2013-11-22 ENCOUNTER — Telehealth: Payer: Self-pay | Admitting: *Deleted

## 2013-11-22 ENCOUNTER — Ambulatory Visit (INDEPENDENT_AMBULATORY_CARE_PROVIDER_SITE_OTHER): Payer: Medicare Other

## 2013-11-22 DIAGNOSIS — Z5181 Encounter for therapeutic drug level monitoring: Secondary | ICD-10-CM

## 2013-11-22 DIAGNOSIS — I2589 Other forms of chronic ischemic heart disease: Secondary | ICD-10-CM

## 2013-11-22 DIAGNOSIS — Z7901 Long term (current) use of anticoagulants: Secondary | ICD-10-CM

## 2013-11-22 DIAGNOSIS — I255 Ischemic cardiomyopathy: Secondary | ICD-10-CM

## 2013-11-22 LAB — POCT INR: INR: 2.2

## 2013-11-22 MED ORDER — SOTALOL HCL 80 MG PO TABS: ORAL_TABLET | ORAL | Status: DC

## 2013-11-23 ENCOUNTER — Telehealth: Payer: Self-pay | Admitting: Cardiology

## 2013-11-23 NOTE — Telephone Encounter (Signed)
Walk in pt Form " Sealed Envelope" Dropped off will give to [facility] Monday

## 2013-11-27 NOTE — Telephone Encounter (Signed)
Mail order did not process Xanax Rx that was faxed in secondary to having  Dr. Mare Ferrari as a [place] [employer]. Called Rx in on 11/22/13

## 2013-11-28 ENCOUNTER — Encounter: Payer: Self-pay | Admitting: Cardiology

## 2013-11-28 ENCOUNTER — Telehealth: Payer: Self-pay | Admitting: Cardiology

## 2013-11-28 NOTE — Telephone Encounter (Signed)
Called pt in reference to e-mail. I informed pt that his last transmission was in May and that his next transmission in August will mark that 91 day mark. Pt verbalized understanding.

## 2013-12-05 ENCOUNTER — Emergency Department (HOSPITAL_COMMUNITY): Payer: Medicare Other

## 2013-12-05 ENCOUNTER — Inpatient Hospital Stay (HOSPITAL_COMMUNITY): Payer: Medicare Other

## 2013-12-05 ENCOUNTER — Inpatient Hospital Stay (HOSPITAL_COMMUNITY)
Admission: EM | Admit: 2013-12-05 | Discharge: 2013-12-08 | DRG: 308 | Disposition: A | Discharge status: Home / Self Care | Payer: Medicare Other | Attending: Cardiology | Admitting: Cardiology

## 2013-12-05 ENCOUNTER — Encounter (HOSPITAL_COMMUNITY): Payer: Self-pay | Admitting: Emergency Medicine

## 2013-12-05 DIAGNOSIS — I5022 Chronic systolic (congestive) heart failure: Secondary | ICD-10-CM

## 2013-12-05 DIAGNOSIS — E872 Acidosis, unspecified: Secondary | ICD-10-CM | POA: Diagnosis present

## 2013-12-05 DIAGNOSIS — M129 Arthropathy, unspecified: Secondary | ICD-10-CM | POA: Diagnosis present

## 2013-12-05 DIAGNOSIS — I255 Ischemic cardiomyopathy: Secondary | ICD-10-CM

## 2013-12-05 DIAGNOSIS — R001 Bradycardia, unspecified: Secondary | ICD-10-CM | POA: Diagnosis present

## 2013-12-05 DIAGNOSIS — Z87891 Personal history of nicotine dependence: Secondary | ICD-10-CM

## 2013-12-05 DIAGNOSIS — J9600 Acute respiratory failure, unspecified whether with hypoxia or hypercapnia: Secondary | ICD-10-CM | POA: Diagnosis present

## 2013-12-05 DIAGNOSIS — N189 Chronic kidney disease, unspecified: Secondary | ICD-10-CM | POA: Diagnosis present

## 2013-12-05 DIAGNOSIS — R220 Localized swelling, mass and lump, head: Secondary | ICD-10-CM | POA: Diagnosis present

## 2013-12-05 DIAGNOSIS — I509 Heart failure, unspecified: Secondary | ICD-10-CM | POA: Diagnosis present

## 2013-12-05 DIAGNOSIS — I469 Cardiac arrest, cause unspecified: Secondary | ICD-10-CM

## 2013-12-05 DIAGNOSIS — I2589 Other forms of chronic ischemic heart disease: Secondary | ICD-10-CM | POA: Diagnosis present

## 2013-12-05 DIAGNOSIS — I4510 Unspecified right bundle-branch block: Secondary | ICD-10-CM | POA: Diagnosis present

## 2013-12-05 DIAGNOSIS — I4729 Other ventricular tachycardia: Secondary | ICD-10-CM

## 2013-12-05 DIAGNOSIS — I252 Old myocardial infarction: Secondary | ICD-10-CM | POA: Diagnosis not present

## 2013-12-05 DIAGNOSIS — I959 Hypotension, unspecified: Secondary | ICD-10-CM | POA: Diagnosis present

## 2013-12-05 DIAGNOSIS — I471 Supraventricular tachycardia, unspecified: Secondary | ICD-10-CM

## 2013-12-05 DIAGNOSIS — F411 Generalized anxiety disorder: Secondary | ICD-10-CM | POA: Diagnosis present

## 2013-12-05 DIAGNOSIS — E875 Hyperkalemia: Secondary | ICD-10-CM | POA: Diagnosis present

## 2013-12-05 DIAGNOSIS — Z7901 Long term (current) use of anticoagulants: Secondary | ICD-10-CM

## 2013-12-05 DIAGNOSIS — D45 Polycythemia vera: Secondary | ICD-10-CM | POA: Diagnosis present

## 2013-12-05 DIAGNOSIS — Z823 Family history of stroke: Secondary | ICD-10-CM | POA: Diagnosis not present

## 2013-12-05 DIAGNOSIS — F419 Anxiety disorder, unspecified: Secondary | ICD-10-CM

## 2013-12-05 DIAGNOSIS — Z95810 Presence of automatic (implantable) cardiac defibrillator: Secondary | ICD-10-CM | POA: Diagnosis not present

## 2013-12-05 DIAGNOSIS — I5023 Acute on chronic systolic (congestive) heart failure: Secondary | ICD-10-CM | POA: Diagnosis present

## 2013-12-05 DIAGNOSIS — Z9861 Coronary angioplasty status: Secondary | ICD-10-CM

## 2013-12-05 DIAGNOSIS — J811 Chronic pulmonary edema: Secondary | ICD-10-CM

## 2013-12-05 DIAGNOSIS — I472 Ventricular tachycardia, unspecified: Secondary | ICD-10-CM

## 2013-12-05 DIAGNOSIS — R042 Hemoptysis: Secondary | ICD-10-CM | POA: Diagnosis not present

## 2013-12-05 DIAGNOSIS — N289 Disorder of kidney and ureter, unspecified: Secondary | ICD-10-CM

## 2013-12-05 DIAGNOSIS — I498 Other specified cardiac arrhythmias: Principal | ICD-10-CM | POA: Diagnosis present

## 2013-12-05 DIAGNOSIS — I129 Hypertensive chronic kidney disease with stage 1 through stage 4 chronic kidney disease, or unspecified chronic kidney disease: Secondary | ICD-10-CM | POA: Diagnosis present

## 2013-12-05 DIAGNOSIS — I2510 Atherosclerotic heart disease of native coronary artery without angina pectoris: Secondary | ICD-10-CM | POA: Diagnosis present

## 2013-12-05 DIAGNOSIS — N179 Acute kidney failure, unspecified: Secondary | ICD-10-CM | POA: Diagnosis present

## 2013-12-05 DIAGNOSIS — Z7982 Long term (current) use of aspirin: Secondary | ICD-10-CM

## 2013-12-05 DIAGNOSIS — Z86718 Personal history of other venous thrombosis and embolism: Secondary | ICD-10-CM

## 2013-12-05 DIAGNOSIS — I739 Peripheral vascular disease, unspecified: Secondary | ICD-10-CM | POA: Diagnosis present

## 2013-12-05 DIAGNOSIS — I501 Left ventricular failure: Secondary | ICD-10-CM

## 2013-12-05 DIAGNOSIS — E871 Hypo-osmolality and hyponatremia: Secondary | ICD-10-CM | POA: Diagnosis present

## 2013-12-05 DIAGNOSIS — R221 Localized swelling, mass and lump, neck: Secondary | ICD-10-CM | POA: Diagnosis present

## 2013-12-05 DIAGNOSIS — Z888 Allergy status to other drugs, medicaments and biological substances status: Secondary | ICD-10-CM

## 2013-12-05 DIAGNOSIS — J9601 Acute respiratory failure with hypoxia: Secondary | ICD-10-CM

## 2013-12-05 HISTORY — DX: Supraventricular tachycardia: I47.1

## 2013-12-05 HISTORY — DX: Supraventricular tachycardia, unspecified: I47.10

## 2013-12-05 HISTORY — DX: Cardiac arrest, cause unspecified: I46.9

## 2013-12-05 LAB — BASIC METABOLIC PANEL
ANION GAP: 18 — AB (ref 5–15)
Anion gap: 15 (ref 5–15)
BUN: 18 mg/dL (ref 6–23)
BUN: 28 mg/dL — AB (ref 6–23)
CHLORIDE: 98 meq/L (ref 96–112)
CO2: 21 meq/L (ref 19–32)
CO2: 22 meq/L (ref 19–32)
CREATININE: 1.45 mg/dL — AB (ref 0.50–1.35)
Calcium: 9.4 mg/dL (ref 8.4–10.5)
Calcium: 8.6 mg/dL (ref 8.4–10.5)
Chloride: 93 mEq/L — ABNORMAL LOW (ref 96–112)
Creatinine, Ser: 1.53 mg/dL — ABNORMAL HIGH (ref 0.50–1.35)
GFR calc Af Amer: 54 mL/min — ABNORMAL LOW (ref >90)
GFR calc non Af Amer: 44 mL/min — ABNORMAL LOW (ref >90)
GFR calc non Af Amer: 47 mL/min — ABNORMAL LOW (ref >90)
GFR, EST AFRICAN AMERICAN: 51 mL/min — AB (ref >90)
GLUCOSE: 117 mg/dL — AB (ref 70–99)
Glucose, Bld: 265 mg/dL — ABNORMAL HIGH (ref 70–99)
POTASSIUM: 4.3 meq/L (ref 3.7–5.3)
POTASSIUM: 5.8 meq/L — AB (ref 3.7–5.3)
Sodium: 133 mEq/L — ABNORMAL LOW (ref 137–147)
Sodium: 134 mEq/L — ABNORMAL LOW (ref 137–147)

## 2013-12-05 LAB — BLOOD GAS, ARTERIAL
ACID-BASE DEFICIT: 7.6 mmol/L — AB (ref 0.0–2.0)
BICARBONATE: 17.3 meq/L — AB (ref 20.0–24.0)
Delivery systems: POSITIVE
Drawn by: 39898
Expiratory PAP: 10
FIO2: 1 %
Inspiratory PAP: 25
O2 Saturation: 97.1 %
PATIENT TEMPERATURE: 98.6
PH ART: 7.318 — AB (ref 7.350–7.450)
TCO2: 18.4 mmol/L (ref 0–100)
pCO2 arterial: 34.8 mmHg — ABNORMAL LOW (ref 35.0–45.0)
pO2, Arterial: 105 mmHg — ABNORMAL HIGH (ref 80.0–100.0)

## 2013-12-05 LAB — PROTIME-INR
INR: 2.06 — AB (ref 0.00–1.49)
INR: 2.12 — AB (ref 0.00–1.49)
PROTHROMBIN TIME: 23.7 s — AB (ref 11.6–15.2)
Prothrombin Time: 23.2 seconds — ABNORMAL HIGH (ref 11.6–15.2)

## 2013-12-05 LAB — I-STAT ARTERIAL BLOOD GAS, ED
ACID-BASE DEFICIT: 10 mmol/L — AB (ref 0.0–2.0)
Acid-base deficit: 7 mmol/L — ABNORMAL HIGH (ref 0.0–2.0)
BICARBONATE: 17.9 meq/L — AB (ref 20.0–24.0)
Bicarbonate: 19.4 mEq/L — ABNORMAL LOW (ref 20.0–24.0)
O2 Saturation: 81 %
O2 Saturation: 88 %
PO2 ART: 55 mmHg — AB (ref 80.0–100.0)
TCO2: 19 mmol/L (ref 0–100)
TCO2: 21 mmol/L (ref 0–100)
pCO2 arterial: 39.7 mmHg (ref 35.0–45.0)
pCO2 arterial: 44.5 mmHg (ref 35.0–45.0)
pH, Arterial: 7.213 — ABNORMAL LOW (ref 7.350–7.450)
pH, Arterial: 7.297 — ABNORMAL LOW (ref 7.350–7.450)
pO2, Arterial: 60 mmHg — ABNORMAL LOW (ref 80.0–100.0)

## 2013-12-05 LAB — CBC WITH DIFFERENTIAL/PLATELET
BASOS PCT: 1 % (ref 0–1)
Basophils Absolute: 0.1 10*3/uL (ref 0.0–0.1)
Basophils Absolute: 0.1 10*3/uL (ref 0.0–0.1)
Basophils Relative: 1 % (ref 0–1)
Eosinophils Absolute: 0 10*3/uL (ref 0.0–0.7)
Eosinophils Absolute: 0.2 10*3/uL (ref 0.0–0.7)
Eosinophils Relative: 0 % (ref 0–5)
Eosinophils Relative: 1 % (ref 0–5)
HCT: 49.6 % (ref 39.0–52.0)
HEMATOCRIT: 49.2 % (ref 39.0–52.0)
HEMOGLOBIN: 17.1 g/dL — AB (ref 13.0–17.0)
Hemoglobin: 16.4 g/dL (ref 13.0–17.0)
LYMPHS ABS: 1.3 10*3/uL (ref 0.7–4.0)
LYMPHS ABS: 3 10*3/uL (ref 0.7–4.0)
Lymphocytes Relative: 15 % (ref 12–46)
Lymphocytes Relative: 27 % (ref 12–46)
MCH: 32 pg (ref 26.0–34.0)
MCH: 32.4 pg (ref 26.0–34.0)
MCHC: 33.3 g/dL (ref 30.0–36.0)
MCHC: 34.5 g/dL (ref 30.0–36.0)
MCV: 93.9 fL (ref 78.0–100.0)
MCV: 95.9 fL (ref 78.0–100.0)
MONOS PCT: 4 % (ref 3–12)
Monocytes Absolute: 0.4 10*3/uL (ref 0.1–1.0)
Monocytes Absolute: 0.5 10*3/uL (ref 0.1–1.0)
Monocytes Relative: 4 % (ref 3–12)
NEUTROS ABS: 7.5 10*3/uL (ref 1.7–7.7)
NEUTROS PCT: 67 % (ref 43–77)
NEUTROS PCT: 80 % — AB (ref 43–77)
Neutro Abs: 7.1 10*3/uL (ref 1.7–7.7)
Platelets: 256 10*3/uL (ref 150–400)
Platelets: 267 10*3/uL (ref 150–400)
RBC: 5.13 MIL/uL (ref 4.22–5.81)
RBC: 5.28 MIL/uL (ref 4.22–5.81)
RDW: 12.9 % (ref 11.5–15.5)
RDW: 13 % (ref 11.5–15.5)
WBC: 11.2 10*3/uL — AB (ref 4.0–10.5)
WBC: 8.8 10*3/uL (ref 4.0–10.5)

## 2013-12-05 LAB — I-STAT CHEM 8, ED
BUN: 20 mg/dL (ref 6–23)
CALCIUM ION: 1.15 mmol/L (ref 1.13–1.30)
Chloride: 97 mEq/L (ref 96–112)
Creatinine, Ser: 1.7 mg/dL — ABNORMAL HIGH (ref 0.50–1.35)
Glucose, Bld: 258 mg/dL — ABNORMAL HIGH (ref 70–99)
HEMATOCRIT: 55 % — AB (ref 39.0–52.0)
Hemoglobin: 18.7 g/dL — ABNORMAL HIGH (ref 13.0–17.0)
Potassium: 5.6 mEq/L — ABNORMAL HIGH (ref 3.7–5.3)
SODIUM: 133 meq/L — AB (ref 137–147)
TCO2: 23 mmol/L (ref 0–100)

## 2013-12-05 LAB — POCT I-STAT 3, ART BLOOD GAS (G3+)
Acid-base deficit: 4 mmol/L — ABNORMAL HIGH (ref 0.0–2.0)
Bicarbonate: 20.9 mEq/L (ref 20.0–24.0)
O2 Saturation: 99 %
PCO2 ART: 36.6 mmHg (ref 35.0–45.0)
PH ART: 7.364 (ref 7.350–7.450)
PO2 ART: 151 mmHg — AB (ref 80.0–100.0)
TCO2: 22 mmol/L (ref 0–100)

## 2013-12-05 LAB — I-STAT CG4 LACTIC ACID, ED: LACTIC ACID, VENOUS: 6.5 mmol/L — AB (ref 0.5–2.2)

## 2013-12-05 LAB — TROPONIN I
Troponin I: 0.32 ng/mL (ref <0.30)
Troponin I: <0.3 ng/mL (ref <0.30)
Troponin I: <0.3 ng/mL (ref <0.30)
Troponin I: <0.3 ng/mL (ref <0.30)

## 2013-12-05 LAB — I-STAT TROPONIN, ED: Troponin i, poc: 0.09 ng/mL (ref 0.00–0.08)

## 2013-12-05 LAB — PRO B NATRIURETIC PEPTIDE: Pro B Natriuretic peptide (BNP): 2438 pg/mL — ABNORMAL HIGH (ref 0–125)

## 2013-12-05 LAB — APTT: aPTT: 30 seconds (ref 24–37)

## 2013-12-05 LAB — MRSA PCR SCREENING: MRSA BY PCR: NEGATIVE

## 2013-12-05 MED ORDER — IPRATROPIUM-ALBUTEROL 0.5-2.5 (3) MG/3ML IN SOLN
3.0000 mL | Freq: Once | RESPIRATORY_TRACT | Status: AC
  Administered 2013-12-05: 3 mL via RESPIRATORY_TRACT
  Filled 2013-12-05: qty 3

## 2013-12-05 MED ORDER — CARVEDILOL 6.25 MG PO TABS
6.2500 mg | ORAL_TABLET | Freq: Two times a day (BID) | ORAL | Status: DC
  Administered 2013-12-06 – 2013-12-08 (×5): 6.25 mg via ORAL
  Filled 2013-12-05 (×8): qty 1

## 2013-12-05 MED ORDER — SOTALOL HCL 80 MG PO TABS
80.0000 mg | ORAL_TABLET | Freq: Two times a day (BID) | ORAL | Status: DC
  Administered 2013-12-05 – 2013-12-08 (×7): 80 mg via ORAL
  Filled 2013-12-05 (×10): qty 1

## 2013-12-05 MED ORDER — ONDANSETRON HCL 4 MG/2ML IJ SOLN: 4.0000 mg | Freq: Four times a day (QID) | INTRAMUSCULAR | Status: DC | PRN

## 2013-12-05 MED ORDER — ROCURONIUM BROMIDE 50 MG/5ML IV SOLN
INTRAVENOUS | Status: AC
  Filled 2013-12-05: qty 2

## 2013-12-05 MED ORDER — ALPRAZOLAM 0.25 MG PO TABS
0.2500 mg | ORAL_TABLET | Freq: Once | ORAL | Status: AC
  Administered 2013-12-05: 0.25 mg via ORAL
  Filled 2013-12-05: qty 1

## 2013-12-05 MED ORDER — SUCCINYLCHOLINE CHLORIDE 20 MG/ML IJ SOLN
INTRAMUSCULAR | Status: AC
  Filled 2013-12-05: qty 1

## 2013-12-05 MED ORDER — FUROSEMIDE 10 MG/ML IJ SOLN
60.0000 mg | Freq: Once | INTRAMUSCULAR | Status: AC
  Administered 2013-12-05: 60 mg via INTRAVENOUS

## 2013-12-05 MED ORDER — FUROSEMIDE 10 MG/ML IJ SOLN
40.0000 mg | Freq: Two times a day (BID) | INTRAMUSCULAR | Status: DC
  Administered 2013-12-05 – 2013-12-06 (×2): 40 mg via INTRAVENOUS
  Filled 2013-12-05 (×5): qty 4

## 2013-12-05 MED ORDER — LIDOCAINE HCL (CARDIAC) 20 MG/ML IV SOLN
INTRAVENOUS | Status: AC
Start: 2013-12-05 — End: 2013-12-05
  Filled 2013-12-05: qty 5

## 2013-12-05 MED ORDER — FUROSEMIDE 10 MG/ML IJ SOLN
40.0000 mg | Freq: Once | INTRAMUSCULAR | Status: DC
  Filled 2013-12-05 (×2): qty 4

## 2013-12-05 MED ORDER — CHLORHEXIDINE GLUCONATE 0.12 % MT SOLN
15.0000 mL | Freq: Two times a day (BID) | OROMUCOSAL | Status: DC
  Administered 2013-12-05 – 2013-12-06 (×2): 15 mL via OROMUCOSAL
  Filled 2013-12-05 (×2): qty 15

## 2013-12-05 MED ORDER — ASPIRIN 81 MG PO CHEW
324.0000 mg | CHEWABLE_TABLET | ORAL | Status: AC
  Administered 2013-12-05: 324 mg via ORAL
  Filled 2013-12-05: qty 4

## 2013-12-05 MED ORDER — MENTHOL 3 MG MT LOZG
1.0000 | LOZENGE | OROMUCOSAL | Status: DC | PRN
Start: 2013-12-05 — End: 2013-12-08
  Administered 2013-12-06 – 2013-12-07 (×2): 3 mg via ORAL
  Filled 2013-12-05 (×2): qty 9

## 2013-12-05 MED ORDER — LOSARTAN POTASSIUM 25 MG PO TABS
25.0000 mg | ORAL_TABLET | Freq: Two times a day (BID) | ORAL | Status: DC
  Administered 2013-12-05: 25 mg via ORAL
  Filled 2013-12-05 (×3): qty 1

## 2013-12-05 MED ORDER — BIOTENE DRY MOUTH MT LIQD
15.0000 mL | Freq: Two times a day (BID) | OROMUCOSAL | Status: DC
  Administered 2013-12-05 – 2013-12-06 (×3): 15 mL via OROMUCOSAL

## 2013-12-05 MED ORDER — WARFARIN SODIUM 2.5 MG PO TABS
2.5000 mg | ORAL_TABLET | Freq: Every day | ORAL | Status: DC
Start: 2013-12-05 — End: 2013-12-05

## 2013-12-05 MED ORDER — LEVOTHYROXINE SODIUM 88 MCG PO TABS
88.0000 ug | ORAL_TABLET | ORAL | Status: DC
  Administered 2013-12-05 – 2013-12-08 (×3): 88 ug via ORAL
  Filled 2013-12-05 (×4): qty 1

## 2013-12-05 MED ORDER — ACETAMINOPHEN 325 MG PO TABS
650.0000 mg | ORAL_TABLET | ORAL | Status: DC | PRN
  Administered 2013-12-05 – 2013-12-08 (×5): 650 mg via ORAL
  Filled 2013-12-05 (×6): qty 2

## 2013-12-05 MED ORDER — ASPIRIN EC 81 MG PO TBEC
81.0000 mg | DELAYED_RELEASE_TABLET | Freq: Every day | ORAL | Status: DC
Start: 2013-12-06 — End: 2013-12-06
  Filled 2013-12-05 (×2): qty 1

## 2013-12-05 MED ORDER — ATORVASTATIN CALCIUM 40 MG PO TABS
40.0000 mg | ORAL_TABLET | Freq: Every day | ORAL | Status: DC
  Administered 2013-12-05 – 2013-12-07 (×3): 40 mg via ORAL
  Filled 2013-12-05 (×4): qty 1

## 2013-12-05 MED ORDER — NITROGLYCERIN 0.4 MG SL SUBL: 0.4000 mg | SUBLINGUAL_TABLET | SUBLINGUAL | Status: DC | PRN

## 2013-12-05 MED ORDER — CARVEDILOL 12.5 MG PO TABS
12.5000 mg | ORAL_TABLET | Freq: Two times a day (BID) | ORAL | Status: DC
  Filled 2013-12-05 (×4): qty 1

## 2013-12-05 MED ORDER — MEXILETINE HCL 150 MG PO CAPS
150.0000 mg | ORAL_CAPSULE | Freq: Three times a day (TID) | ORAL | Status: DC
  Administered 2013-12-05 – 2013-12-08 (×10): 150 mg via ORAL
  Filled 2013-12-05 (×13): qty 1

## 2013-12-05 MED ORDER — FUROSEMIDE 10 MG/ML IJ SOLN
40.0000 mg | Freq: Once | INTRAMUSCULAR | Status: AC
  Administered 2013-12-05: 40 mg via INTRAVENOUS

## 2013-12-05 MED ORDER — MORPHINE SULFATE 2 MG/ML IJ SOLN
2.0000 mg | INTRAMUSCULAR | Status: DC | PRN
  Administered 2013-12-05 – 2013-12-07 (×7): 2 mg via INTRAVENOUS
  Filled 2013-12-05 (×7): qty 1

## 2013-12-05 MED ORDER — ASPIRIN 300 MG RE SUPP
300.0000 mg | RECTAL | Status: AC
  Filled 2013-12-05: qty 1

## 2013-12-05 MED ORDER — ETOMIDATE 2 MG/ML IV SOLN
INTRAVENOUS | Status: AC
  Filled 2013-12-05: qty 20

## 2013-12-05 MED ORDER — LEVOTHYROXINE SODIUM 88 MCG PO TABS
132.0000 ug | ORAL_TABLET | ORAL | Status: DC
  Administered 2013-12-06: 132 ug via ORAL
  Filled 2013-12-05 (×2): qty 1.5

## 2013-12-05 NOTE — ED Notes (Signed)
Dr. Sharol Given provided results of Chem-8, elevated CG-4 and Troponin

## 2013-12-05 NOTE — Consult Note (Signed)
PULMONARY / CRITICAL CARE MEDICINE   Name: [name] MRN: [id number] DOB: 01-Jan-1941    ADMISSION DATE:  12/05/2013 CONSULTATION DATE:  12/05/2013  REFERRING MD :  Marlou Porch PRIMARY SERVICE: Cardiology  CHIEF COMPLAINT:  Post cardiac arrest  BRIEF PATIENT DESCRIPTION: 73 y.o. M with hx of ischemic cardiomyopathy and BiV AICD, brought to ED s/p cardiac arrest. Was initially code STEMI; however, this was cancelled by cardiology upon arrival to ED. Cause of arrest thought to be related to ventricular arrhythmia from cardiomopathy.  In ED, initial ABG 7.21/44.5/55. Pt subsequently placed on BiPAP and  PCCM consulted for assistance.  Continued hypoxia.  SIGNIFICANT EVENTS / STUDIES:  7/8 out of hospital cardiac arrest, successful resuscitation in field.  Thought to be secondary to ventricular arrhythmia 7/8 ABG:  7.21 / 44.5 / 55.  CXR revealed pulm edema.  Started on BiPAP, PCCM consulted.  LINES / TUBES: PIV  CULTURES: None  ANTIBIOTICS: None  HISTORY OF PRESENT ILLNESS:  [name] is a 73 y.o. M with PMH as outlined below and who was brought to the ED early AM on 7/8 after he suffered a cardiac arrest.  He received CPR in the field and was successfully resuscitated.  EDP called a code STEMI; however, cardiology did not feel that this was indeed STEMI.  They feel that cause of arrest was more likely related to a ventricular arrhythmia from known ischemic cardiomyopathy.  They deferred on taking pt to cath lab, and planned to admit to ICU, watch for recurrent VT, treat CHF and consider elective cath when more stable. In ED, ABG 7.21 / 44.5 / 55.  CXR demonstrated pulmonary edema.  Started on BiPAP, PCCM consulted.  PAST MEDICAL HISTORY :  Past Medical History  Diagnosis Date  . CAD (coronary artery disease) 1999    Remote anterolater and apical MI, cath 2011 patent stents RCA/LCx  LAD w/o obstruction  . Cardiomyopathy, ischemic     EF 20-25%  echo 2012  . Chronic systolic  congestive heart failure   . ICD (implantable cardiac defibrillator) in place     [place] Jude  . 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 Abdominal aortic aneurysm 2009    Repaired with endovascular stent  . PVD (peripheral vascular disease)     Has occluded innominate vein  . Myocardial infarction   . Hypertension   . Ventricular tachycardia     recurrent slow VT at 100-110 bpm,  Rx mexilitene/sotalol  . Left bundle branch block   . Systolic CHF, chronic   . Arthritis   . Gynecomastia     2/2 spironolactone   Past Surgical History  Procedure Laterality Date  . Icd  Feb 2012    Change out with LV lead placed with tunneling from the right  to the left side  . Endovascular stent insertion      for AAA  . Cardiac catheterization      x2  . Pacemaker insertion    . Ventricular ablation surgery      s/p prior EPS and ablation at [facility] 9/12, [facility] 11/12, and most recent at [facility] 08/23/11  . Tonsillectomy     Prior to Admission medications   Medication Sig Start Date End Date Taking? Authorizing Provider  ALPRAZolam (XANAX) 0.25 MG tablet Take 1 tablet (0.25 mg total) by mouth 4 (four) times daily as needed. For anxiety 11/08/13  Yes Darlin Coco, MD  Ascorbic Acid (VITAMIN C) 500 MG tablet  Take 500 mg by mouth daily.     Yes Historical Provider, MD  aspirin 81 MG tablet Take 81 mg by mouth daily.   Yes Historical Provider, MD  carvedilol (COREG) 12.5 MG tablet Take one tablet by mouth twice daily 03/15/13  Yes Darlin Coco, MD  eplerenone (INSPRA) 25 MG tablet Take 12.5 mg by mouth every evening.  10/25/12  Yes Burtis Junes, NP  furosemide (LASIX) 20 MG tablet Take 20-40 mg by mouth daily as needed for fluid. Prn weight gain of 3 pounds overnight   Yes Historical Provider, MD  levothyroxine (SYNTHROID, LEVOTHROID) 88 MCG tablet Take 88-132 mcg by mouth daily. Take 1 and 1/2 pill on Thursday   Yes Historical Provider, MD  Loratadine (CLARITIN) 10 MG CAPS Take 1 capsule  by mouth daily.    Yes Historical Provider, MD  losartan (COZAAR) 50 MG tablet Take 0.5 tablets (25 mg total) by mouth 2 (two) times daily. 12/03/11  Yes Deboraha Sprang, MD  meclizine (ANTIVERT) 25 MG tablet Take 25 mg by mouth 3 (three) times daily as needed for dizziness.   Yes Historical Provider, MD  mexiletine (MEXITIL) 150 MG capsule Take 150 mg by mouth 3 (three) times daily.   Yes Historical Provider, MD  Multiple Vitamin (MULITIVITAMIN WITH MINERALS) TABS Take 1 tablet by mouth daily.   Yes Historical Provider, MD  Omega-3 Fatty Acids (FISH OIL) 1000 MG CAPS Take 1,000 mg by mouth 2 (two) times daily.    Yes Historical Provider, MD  rosuvastatin (CRESTOR) 20 MG tablet Take 20 mg by mouth 2 (two) times a week. On Tuesday and Saturdays   Yes Historical Provider, MD  sotalol (BETAPACE) 80 MG tablet Take 80 mg by mouth 2 (two) times daily.   Yes Historical Provider, MD  warfarin (COUMADIN) 2.5 MG tablet Take 2.5-3.75 mg by mouth daily. Take 1 tablet everyday except on Friday take tablet and a half (3.75mg )   Yes Historical Provider, MD   Allergies  Allergen Reactions  . Iohexol      Code: HIVES, Desc: hives w/ itching during cardiac cath '99, mult caths since w/ ? premeds, DR.G.HAYES REQUESTS 13 HR PRE MED//A.C.pt okay w/13 hr prep/mms, Onset Date: [id number]   . Prednisone Other (See Comments)    Hives    FAMILY HISTORY:  Family History  Problem Relation Age of Onset  . Stroke Mother   . Heart attack Father   . Heart attack Mother    SOCIAL HISTORY:  reports that he has quit smoking. He quit smokeless tobacco use about 26 years ago. He reports that he drinks alcohol. He reports that he does not use illicit drugs.  REVIEW OF SYSTEMS:  Unable to obtain, pt on BiPAP.  SUBJECTIVE:   VITAL SIGNS: Temp:  [97.4 F (36.3 C)] 97.4 F (36.3 C) (07/08 0117) Pulse Rate:  [67-73] 70 (07/08 0215) Resp:  [31-32] 31 (07/08 0230) BP: (79-149)/(56-84) 98/74 mmHg (07/08 0230) SpO2:  [62 %-91  %] 88 % (07/08 0215) FiO2 (%):  [90 %-100 %] 90 % (07/08 0100) Weight:  [80.287 kg (177 lb)] 80.287 kg (177 lb) (07/08 0117) HEMODYNAMICS:   VENTILATOR SETTINGS: Vent Mode:  [-] BIPAP FiO2 (%):  [90 %-100 %] 90 % Set Rate:  [25 bmp] 25 bmp INTAKE / OUTPUT: Intake/Output   None     PHYSICAL EXAMINATION: General: Elderly appearing male, sitting up in bed, in NAD. Neuro: Follows commands, not talking as is on BiPAP. HEENT: [place]/AT. PERRL, sclerae  anicteric. Cardiovascular: RRR, no M/R/G.  Lungs: Respirations even and unlabored.  Course rhonchi throughout. Abdomen: BS x 4, soft, NT/ND.  Musculoskeletal: No gross deformities, no edema.  Skin: Intact, warm, no rashes.    LABS:  CBC  Recent Labs Lab 12/05/13 0047 12/05/13 0058  WBC 11.2*  --   HGB 16.4 18.7*  HCT 49.2 55.0*  PLT 267  --    Coag's  Recent Labs Lab 12/05/13 0047  INR 2.12*   BMET  Recent Labs Lab 12/05/13 0047 12/05/13 0058  NA 133* 133*  K 5.8* 5.6*  CL 93* 97  CO2 22  --   BUN 18 20  CREATININE 1.53* 1.70*  GLUCOSE 265* 258*   Electrolytes  Recent Labs Lab 12/05/13 0047  CALCIUM 9.4   Sepsis Markers  Recent Labs Lab 12/05/13 0059  LATICACIDVEN 6.50*   ABG  Recent Labs Lab 12/05/13 0043  PHART 7.213*  PCO2ART 44.5  PO2ART 55.0*   Liver Enzymes No results found for this basename: AST, ALT, ALKPHOS, BILITOT, ALBUMIN,  in the last 168 hours Cardiac Enzymes  Recent Labs Lab 12/05/13 0047  TROPONINI 0.32*  PROBNP 2438.0*   Glucose No results found for this basename: GLUCAP,  in the last 168 hours  Imaging Dg Chest Portable 1 View  12/05/2013   CLINICAL DATA:  Shortness of breath.  Post CPR.  EXAM: PORTABLE CHEST - 1 VIEW  COMPARISON:  04/07/2011  FINDINGS: Cardiac pacemaker. Cardiac enlargement with mild pulmonary vascular congestion. Diffuse airspace disease in the lungs likely represents edema or pneumonia. No blunting of costophrenic angles. No pneumothorax.   IMPRESSION: Cardiac enlargement with pulmonary vascular congestion and bilateral airspace disease, likely edema.   Electronically Signed   By: Lucienne Capers M.D.   On: 12/05/2013 01:15    ASSESSMENT / PLAN:  PULMONARY A: Pulmonary Edema Protecting airway on own though at risk intubation Hypoxic resp failure P:   - Continuous BiPAP 4 hours on, 1 hour off. - ABG reviewed and BiPAP settings adjusted. - ABG 2 hours post initiation of BiPAP. - Lasix 60mg  x 1, goal net neg 2 liters if able - Low threshold for intubation. - CXR in AM. -if afterload rises, treat  CARDIOVASCULAR A:  S/p cardiac arrest - thought secondary to ventricular arrhythmia. CHF exacerbation - last echo 10/25/2012 - EF 25%. Ischemic cardiomyopathy CAD HTN P:  - Manager per cards. - ? Elective cath once more stable. -lasix  RENAL A:   Hyponatremia - likely CHF/volume overload. Hyperkalemia - 5.6 on admit. AKI AG metabolic acidosis - lactate. P:   - Lasix 60mg  once. - BMP q8hrs.  GASTROINTESTINAL A:  Nutrition P:   - NPO for now. -add ppi  HEMATOLOGIC A:   Polycythemia anticoagulated P:  - CBC in AM. -per cards  INFECTIOUS A:   No evidence of infection P:   - Monitor fever curve / WBC's. -if spike, add unasyn  ENDOCRINE A:  Hyperthyoridism P:   - Continue synthroid.  NEUROLOGIC A:  NIMV P:   - may need low dose fent -avoid benzo   Montey Hora, PA - [facility] Pgr: [phone] - [phone]  or [phone]  I have personally obtained a history, examined the patient, evaluated laboratory and imaging results, formulated the assessment and plan and placed orders. CRITICAL CARE: The patient is critically ill with multiple organ systems failure and requires high complexity decision making for assessment and support, frequent evaluation  and titration of therapies, application of advanced monitoring technologies and extensive interpretation of  multiple databases. Critical Care Time devoted to patient care services described in this note is 30 minutes.   Lavon Paganini. Titus Mould, MD, [place] Pgr: [facility]  Pulmonary and [place] Pager: [phone]  12/05/2013, 2:45 AM

## 2013-12-05 NOTE — Progress Notes (Signed)
ANTICOAGULATION CONSULT NOTE - Initial Consult  Pharmacy Consult for Heparin  Indication: chest pain/ACS  Allergies  Allergen Reactions  . Iohexol      Code: HIVES, Desc: hives w/ itching during cardiac cath '99, mult caths since w/ ? premeds, DR.G.HAYES REQUESTS 13 HR PRE MED//A.C.pt okay w/13 hr prep/mms, Onset Date: [id number]   . Prednisone Other (See Comments)    Hives    Patient Measurements: Height: 5\' 8"  (172.7 cm) Weight: 180 lb 8.9 oz (81.9 kg) IBW/kg (Calculated) : 68.4  Vital Signs: Temp: 97.4 F (36.3 C) (07/08 0330) Temp src: Axillary (07/08 0330) BP: 113/72 mmHg (07/08 0330) Pulse Rate: 79 (07/08 0330)  Labs:  Recent Labs  12/05/13 0047 12/05/13 0058  HGB 16.4 18.7*  HCT 49.2 55.0*  PLT 267  --   LABPROT 23.7*  --   INR 2.12*  --   CREATININE 1.53* 1.70*  TROPONINI 0.32*  --     Estimated Creatinine Clearance: 38 ml/min (by C-G formula based on Cr of 1.7).   Medical History: Past Medical History  Diagnosis Date  . CAD (coronary artery disease) 1999    Remote anterolater and apical MI, cath 2011 patent stents RCA/LCx  LAD w/o obstruction  . Cardiomyopathy, ischemic     EF 20-25%  echo 2012  . Chronic systolic congestive heart failure   . ICD (implantable cardiac defibrillator) in place     [place] Jude  . 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 Abdominal aortic aneurysm 2009    Repaired with endovascular stent  . PVD (peripheral vascular disease)     Has occluded innominate vein  . Myocardial infarction   . Hypertension   . Ventricular tachycardia     recurrent slow VT at 100-110 bpm,  Rx mexilitene/sotalol  . Left bundle branch block   . Systolic CHF, chronic   . Arthritis   . Gynecomastia     2/2 spironolactone    Medications:  Warfarin PTA  Assessment: 73 y/o M with BIV AICD, s/p arrest, to start heparin per pharmacy, on warfarin PTA, INR 2.12 on admit, other labs as above.   Goal of Therapy:  Heparin level 0.3-0.7  units/ml Monitor platelets by anticoagulation protocol: Yes   Plan:  -Re-check INR this AM -Start heparin when INR <2  Narda Bonds 12/05/2013,3:42 AM

## 2013-12-05 NOTE — Procedures (Signed)
Arterial Catheter Insertion Procedure Note [name] [id number] November 25, 1940  Procedure: Insertion of Arterial Catheter  Indications: Blood pressure monitoring  Procedure Details Consent: Time Out: Verified patient identification, verified procedure, site/side was marked, verified correct patient position, special equipment/implants available, medications/allergies/relevent history reviewed, required imaging and test results available.  Performed  Maximum sterile technique was used including antiseptics, cap, gloves, gown, hand hygiene, mask and sheet. Skin prep: Chlorhexidine; local anesthetic administered 20 gauge catheter was inserted into right radial artery using the Seldinger technique.  Evaluation Blood flow good; BP tracing good. Complications: No apparent complications.     [name] Darius Bump 12/05/2013

## 2013-12-05 NOTE — ED Notes (Signed)
Dr Clayborne Artist notified of Troponin 0.32

## 2013-12-05 NOTE — ED Notes (Signed)
Patient presents post CPR.  Patient is responding to verbal and painful stimulations.  Patient placed BiPap 20/8 EMS BP 134 palp  2 rounds of CPR and got pulses back.  Patient arrives very diaphoretic and mottled.  Weak right femoral pulses noted and unable to obtain pulses in the feet

## 2013-12-05 NOTE — ED Notes (Signed)
Dr Sharol Given reports weak palpable pulse to right foot.

## 2013-12-05 NOTE — Consult Note (Addendum)
INTERVENTIONAL CARDIOLOGY NOTE  STEMI called on [name] in ER at [facility] by Dr. Sharol Given. She and I discussed the presentation. He has history of ischemic cardiomyopathy, BiV AICD, and was brought to ER post arrest. He did receive in field CPR. He is not intubated and not complaining of chest pain. Concern is for ECG which shows an atrial paced rhythm with RBBB/LAD. No definite STEMI pattern on ECG but perhaps slightly more inferior STE than prior tracing in 04/2013. He has a history of VT with VT storm. He has undergone VT ablation x 3, most recently in 2013 by Dr. Rayann Heman. Most recent clinical problems have been CHF. Current CXR is consistent with CHF.  In absence of chest pain and no definitive ECG (paced rhythm) evidence of STEMI, I have cancelled the STEMI protocol. I suspect that cardiac arrest is related to ventricular arrhythmia and that he has probably had AICD assisted recovery from cardiac arrest.  Plan should be to admit to unit, watch for recurrent VT, treat CHF, treat any electrolyte abnormality, interrogate device, and consider elective cath when more stable and out of CHF. If ischemic CP or greater than expected TI marker rise for CPR, we could pursue more urgent cath.  Time spent 30 mins.

## 2013-12-05 NOTE — H&P (Signed)
Patient ID: [name] MRN: [id number], DOB/AGE: 1940/06/01   Admit date: 12/05/2013   Primary Physician: Sheela Stack, MD Primary Cardiologist: Bobby Rumpf  CC: Cardiac arrest  Problem List  Past Medical History  Diagnosis Date  . CAD (coronary artery disease) 1999    Remote anterolater and apical MI, cath 2011 patent stents RCA/LCx  LAD w/o obstruction  . Cardiomyopathy, ischemic     EF 20-25%  echo 2012  . Chronic systolic congestive heart failure   . ICD (implantable cardiac defibrillator) in place     [place] Jude  . 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 Abdominal aortic aneurysm 2009    Repaired with endovascular stent  . PVD (peripheral vascular disease)     Has occluded innominate vein  . Myocardial infarction   . Hypertension   . Ventricular tachycardia     recurrent slow VT at 100-110 bpm,  Rx mexilitene/sotalol  . Left bundle branch block   . Systolic CHF, chronic   . Arthritis   . Gynecomastia     2/2 spironolactone    Past Surgical History  Procedure Laterality Date  . Icd  Feb 2012    Change out with LV lead placed with tunneling from the right  to the left side  . Endovascular stent insertion      for AAA  . Cardiac catheterization      x2  . Pacemaker insertion    . Ventricular ablation surgery      s/p prior EPS and ablation at [facility] 9/12, [facility] 11/12, and most recent at [facility] 08/23/11  . Tonsillectomy       Allergies  Allergies  Allergen Reactions  . Iohexol      Code: HIVES, Desc: hives w/ itching during cardiac cath '99, mult caths since w/ ? premeds, DR.G.HAYES REQUESTS 13 HR PRE MED//A.C.pt okay w/13 hr prep/mms, Onset Date: [id number]   . Prednisone Other (See Comments)    Hives    HPI The patient is a 73M with a history of CAD s/p PCI to the LAD, LCx, RCA (distant), HFrEF 25%, significant VT s/p ablation and CRT-D (SJM) presents with cardiac arrest. His wife notes that he was in his usual state of health into  the evening of 12/04/2013. At that time, he rapidly developed SOB around 10-10:30pm. His wife called EMS, and when they arrived he had a witnessed arrest in which he abruptly lost consciousness. He underwent CPR but did not need drugs or shocks and regained a pulse. He was then transferred to the ED.  His initial EKG was concerning for inferior STE, though interpretation was complicated by his paced rhythm. Given his prior history of VT storm and no definite evidence of STEMI, the plan was to manage him medically overnight and reconsider cath in the AM.  During his time in the ED, he developed shortness of breath requiring BiPAP. One episode of hypotension to the SBP 70s prompted an arterial line. The critical care team was called, but there was no indication for intubation. His ABG subsequently improved with continued BiPAP.  Home Medications  Prior to Admission medications   Medication Sig Start Date End Date Taking? Authorizing Provider  eplerenone (INSPRA) 25 MG tablet Take 12.5 mg by mouth every evening.  10/25/12  Yes Burtis Junes, NP  furosemide (LASIX) 20 MG tablet Take 20-40 mg by mouth daily as needed for fluid. Prn weight gain of 3 pounds overnight   Yes  Historical Provider, MD  levothyroxine (SYNTHROID, LEVOTHROID) 88 MCG tablet Take 88-132 mcg by mouth daily. Take 1 and 1/2 pill on Thursday   Yes Historical Provider, MD  Loratadine (CLARITIN) 10 MG CAPS Take 1 capsule by mouth daily.    Yes Historical Provider, MD  losartan (COZAAR) 50 MG tablet Take 0.5 tablets (25 mg total) by mouth 2 (two) times daily. 12/03/11  Yes Deboraha Sprang, MD  meclizine (ANTIVERT) 25 MG tablet Take 25 mg by mouth 3 (three) times daily as needed for dizziness.   Yes Historical Provider, MD  mexiletine (MEXITIL) 150 MG capsule Take 150 mg by mouth 3 (three) times daily.   Yes Historical Provider, MD  Multiple Vitamin (MULITIVITAMIN WITH MINERALS) TABS Take 1 tablet by mouth daily.   Yes Historical Provider, MD    Omega-3 Fatty Acids (FISH OIL) 1000 MG CAPS Take 1,000 mg by mouth 2 (two) times daily.    Yes Historical Provider, MD  rosuvastatin (CRESTOR) 20 MG tablet Take 20 mg by mouth 2 (two) times a week. On Tuesday and Saturdays   Yes Historical Provider, MD  sotalol (BETAPACE) 80 MG tablet Take 80 mg by mouth 2 (two) times daily.   Yes Historical Provider, MD  warfarin (COUMADIN) 2.5 MG tablet Take 2.5-3.75 mg by mouth daily. Take 1 tablet everyday except on Friday take tablet and a half (3.75mg )   Yes Historical Provider, MD  ALPRAZolam (XANAX) 0.25 MG tablet Take 1 tablet (0.25 mg total) by mouth 4 (four) times daily as needed. For anxiety 11/08/13   Darlin Coco, MD  Ascorbic Acid (VITAMIN C) 500 MG tablet Take 500 mg by mouth daily.      Historical Provider, MD  aspirin 81 MG tablet Take 81 mg by mouth daily.    Historical Provider, MD  carvedilol (COREG) 12.5 MG tablet Take one tablet by mouth twice daily 03/15/13   Darlin Coco, MD    Family History  Family History  Problem Relation Age of Onset  . Stroke Mother   . Heart attack Father   . Heart attack Mother     Social History  History   Social History  . Marital Status: Married    Spouse Name: N/A    Number of Children: N/A  . Years of Education: N/A   Occupational History  . retired      used to Armed forces operational officer   Social History Main Topics  . Smoking status: Former Research scientist (life sciences)  . Smokeless tobacco: Former Systems developer    Quit date: 06/01/1987  . Alcohol Use: Yes     Comment: very rare  . Drug Use: No  . Sexual Activity: Not Currently   Other Topics Concern  . Not on file   Social History Narrative  . No narrative on file     Review of Systems General:  No chills, fever, night sweats or weight changes.  Cardiovascular:  +CP (now soreness), +dyspnea on exertion, no edema, orthopnea, palpitations, paroxysmal nocturnal dyspnea. Dermatological: No rash, lesions/masses Respiratory: No cough, +dyspnea Urologic: No  hematuria, dysuria Abdominal:   No nausea, vomiting, diarrhea, bright red blood per rectum, melena, or hematemesis Neurologic:  No visual changes, wkns, changes in mental status. All other systems reviewed and are otherwise negative except as noted above.  Physical Exam  Blood pressure 88/56, pulse 73, temperature 97.4 F (36.3 C), temperature source Axillary, resp. rate 25, height 5\' 8"  (1.727 m), weight 180 lb 8.9 oz (81.9 kg), SpO2 99.00%.  General: Pleasant, NAD  Psych: Normal affect. Neuro: Alert and oriented X 3. Moves all extremities spontaneously. HEENT: Normal  Neck: Supple without bruits, JVP 10cm.  Lungs:  Tachypneic, bibasilar crackles. Heart: RRR no s3, s4, or murmurs. Abdomen: Soft, non-tender, non-distended, BS + x 4.  Extremities: No clubbing, cyanosis or edema. DP/PT/Radials 2+ and equal bilaterally.  Labs  Troponin [facility] Test)  Recent Labs  12/05/13 0057  TROPIPOC 0.09*    Recent Labs  12/05/13 0047 12/05/13 0338  TROPONINI 0.32* <0.30   Lab Results  Component Value Date   WBC 8.8 12/05/2013   HGB 17.1* 12/05/2013   HCT 49.6 12/05/2013   MCV 93.9 12/05/2013   PLT 256 12/05/2013    Recent Labs Lab 12/05/13 0047 12/05/13 0058  NA 133* 133*  K 5.8* 5.6*  CL 93* 97  CO2 22  --   BUN 18 20  CREATININE 1.53* 1.70*  CALCIUM 9.4  --   GLUCOSE 265* 258*   Lab Results  Component Value Date   CHOL  Value: 171        ATP III CLASSIFICATION:  <200     mg/dL   Desirable  200-239  mg/dL   Borderline High  >=240    mg/dL   High 12/21/2007   HDL 47 12/21/2007   LDLCALC  Value: 115        Total Cholesterol/HDL:CHD Risk Coronary Heart Disease Risk Table                     Men   Women  1/2 Average Risk   3.4   3.3* 12/21/2007   TRIG 47 12/21/2007   No results found for this basename: DDIMER     Radiology/Studies  Dg Chest Portable 1 View  12/05/2013   CLINICAL DATA:  Shortness of breath.  Post CPR.  EXAM: PORTABLE CHEST - 1 VIEW  COMPARISON:  04/07/2011   FINDINGS: Cardiac pacemaker. Cardiac enlargement with mild pulmonary vascular congestion. Diffuse airspace disease in the lungs likely represents edema or pneumonia. No blunting of costophrenic angles. No pneumothorax.  IMPRESSION: Cardiac enlargement with pulmonary vascular congestion and bilateral airspace disease, likely edema.   Electronically Signed   By: Lucienne Capers M.D.   On: 12/05/2013 01:15    ECG BiV paced rhythm with 4-40mm STE inferiorly discordant with QRS, worse from previous  Device interrogation (preliminary by me, should be formally interrogated) Loletha Grayer DDIR 70 Tachy VT 105, ATP x10, ATP x 3 VF 193 36J, 40J, 40J He had an episode of SVT-VT with CL in the 485-545ms range. Discriminators were unable to discern and he was treated with multiple bursts of ATP  TTE 09/2012 Study Conclusions - Left ventricle: Septal, apical mid and distal anterior wall and inferior wall severe hypokinesis The cavity size was severely dilated. Wall thickness was normal. The estimated ejection fraction was 25%. - Aortic valve: Mild regurgitation. - Left atrium: The atrium was moderately dilated. - Atrial septum: No defect or patent foramen ovale was identified.  ------------------------------------------------------------ Labs, prior tests, procedures, and surgery: AAA. ICD. Transthoracic echocardiography. M-mode, complete 2D, spectral Doppler, and color Doppler. Height: Height: 172.7cm. Height: 68in. Weight: Weight: 80.3kg. Weight: 176.6lb. Body mass index: BMI: 26.9kg/m^2. Body surface area: BSA: 1.24m^2. Blood pressure: 90/60. Patient status: Outpatient. Location: [place] [facility]  ------------------------------------------------------------  ------------------------------------------------------------ Left ventricle: Septal, apical mid and distal anterior wall and inferior wall severe hypokinesis The cavity size was severely dilated. Wall thickness was normal. The  estimated ejection fraction  was 25%.  ------------------------------------------------------------ Aortic valve: Mildly thickened leaflets. Doppler: Mild regurgitation.  ------------------------------------------------------------ Mitral valve: Mildly thickened leaflets . Doppler: Trivial regurgitation.  ------------------------------------------------------------ Left atrium: The atrium was moderately dilated.  ------------------------------------------------------------ Atrial septum: No defect or patent foramen ovale was identified.  ------------------------------------------------------------ Right ventricle: Pacer wire or catheter noted in right ventricle.  ------------------------------------------------------------ Pulmonic valve: Doppler: Mild regurgitation.  ------------------------------------------------------------ Tricuspid valve: Doppler: Mild regurgitation.  ------------------------------------------------------------ Right atrium: The atrium was normal in size.  ------------------------------------------------------------ Pericardium: The pericardium was normal in appearance.  ------------------------------------------------------------ Systemic veins: Inferior vena cava: The vessel was dilated; the respirophasic diameter changes were blunted (< 50%); findings are consistent with elevated central venous pressure.      ASSESSMENT AND PLAN The patient is a 13M with a history of CAD s/p PCI to the LAD, LCx, RCA (distant), HFrEF 25%, significant VT s/p ablation and CRT-D (SJM) presents with cardiac arrest.   #VT: I interrogated his SJM Unify Quadra device. The device predominantly seems to show SVT, but in a few instances the CL shortens and the device reads VT, though it is unclear if truly VT. He will need a more formal interrogation of his device today with possible device setting  Changes -continue sotalol and mexilitine for now but would be cautious  especially in the setting of worsening AKI -continue carvedilol  #CHF: He had minimal signs of volume overload on examination but given his worsening shortness of breath he was diuresed aggressively to good effect overnight.  -continue more gentle diuresis. -continue BB, hold eplerenone for hyperkalemia  #CAD: His initial EKG was concerning despite being a paced rhythm. His cTn have been relatively unremarkable given that he underwent CPR and have since normalized -continue to trend cTn -ASA, BB, statin -hold ARB 2/2 AKI -heparin gtt started but may not be necessary -if after formal device interrogation he is found to have VT then would consider cath, but at this point would likely favor conservative management if stable.  CKD: Baseline 1.2, now 1.7, likely in the setting of malperfusion during arrest -monitor carefully, hold renal toxic medications.   FULL CODE  Signed, Raliegh Ip, MD MPH  12/05/2013, 5:50 AM As above, patient seen and examined. Patient developed acute dyspnea/CHF last evening. On arrival apparently EMS could not find a pulse and he received transient CPR. His ICD did not fire based on his wife's report. He did not have chest pain. Followup enzymes are negative to date. He is in congestive heart failure this morning. Continue Lasix. Follow renal function closely. Continue aspirin, heparin and beta blocker (change coreg to 6.25 mg by mouth twice a day given acute CHF and borderline blood pressure). Once his CHF improves he will need cardiac catheterization. I will ask electrophysiology to assess. His device needs further interrogation. Will continue sotalol and mexiletine for history of ventricular tachycardia. Adjustments in antiarrhythmics may need to be made based on followup renal function. Blood pressure is borderline. Hold ARB (also with acute on chronic renal insuff). Kirk Ruths

## 2013-12-05 NOTE — ED Notes (Signed)
Patient requesting to have the BiPap off "just for 1 minute".

## 2013-12-05 NOTE — Consult Note (Signed)
ELECTROPHYSIOLOGY CONSULT NOTE    Patient ID: [name] MRN: [id number], DOB/AGE: January 31, 1941 73 y.o.  Admit date: 12/05/2013 Date of Consult: 12-05-2013  Primary Physician: Sheela Stack, MD Primary Cardiologist: Mare Ferrari Electrophysiologist: Caryl Comes  Reason for Consultation: Evaluate device function  HPI:  [name] is a 73 y.o. male with a past medical history significant for CAD, ischemic cardiomyopathy, congestive heart failure, and recurrent VT (s/p ablation X3).  He has been doing well and was in his usual state of health until last night when he developed acute onset shortness of breath.    The sequence of events incl washing the shower floor  Becoming aware that HR was fast and then prog SOB as described below   EMS was called and he lost consciousness.  Per their report, he was pulseless for a time and underwent chest compressions.  He was transferred to [facility] for further evaluation.  He required Bipap and has been diuresed with significant symptomatic improvement.   Device interrogation demonstrated an episode of tachycardia in the VT zone for which ATP was delivered.  This appears to be an SVT as changes in the atrial rate drive changes in the ventricular rate, there is one series of V-A-A-V, and ATP had no effect on atrial rate.  He did not receive any ICD shocks.  Lab work is notable for K of 5.8 on admission, lactic acid of 6.5, creat 1.53.  He was started on eplerone in [id number]  No recent renal assessment or potassium in our record although possibly with PCP  He has not been weighing himself daily, but reports a subjective increase in abdominal girth.  He denies chest pain, palpitations, dizziness, or pre-syncope.  He has not had any recent fevers or chills.  ROS is otherwise negative.   EP has been asked to evaluate device interrogation.   Past Medical History  Diagnosis Date  . CAD (coronary artery disease) 1999    Remote anterolater and apical  MI, cath 2011 patent stents RCA/LCx  LAD w/o obstruction  . Cardiomyopathy, ischemic     EF 20-25%  echo 2012  . Chronic systolic congestive heart failure   . ICD (implantable cardiac defibrillator) in place     [place] Jude  . 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 Abdominal aortic aneurysm 2009    Repaired with endovascular stent  . PVD (peripheral vascular disease)     Has occluded innominate vein  . Myocardial infarction   . Hypertension   . Ventricular tachycardia     recurrent slow VT at 100-110 bpm,  Rx mexilitene/sotalol  . Left bundle branch block   . Systolic CHF, chronic   . Arthritis   . Gynecomastia     2/2 spironolactone     Surgical History:  Past Surgical History  Procedure Laterality Date  . Icd  Feb 2012    Change out with LV lead placed with tunneling from the right  to the left side  . Endovascular stent insertion      for AAA  . Cardiac catheterization      x2  . Pacemaker insertion    . Ventricular ablation surgery      s/p prior EPS and ablation at [facility] 9/12, [facility] 11/12, and most recent [facility] 08/23/11  . Tonsillectomy       Prescriptions prior to admission  Medication Sig Dispense Refill  . eplerenone (INSPRA) 25 MG tablet Take 12.5 mg by mouth every  evening.       . furosemide (LASIX) 20 MG tablet Take 20-40 mg by mouth daily as needed for fluid. Prn weight gain of 3 pounds overnight      . levothyroxine (SYNTHROID, LEVOTHROID) 88 MCG tablet Take 88-132 mcg by mouth daily. Take 1 and 1/2 pill on Thursday      . Loratadine (CLARITIN) 10 MG CAPS Take 1 capsule by mouth daily.       [name] losartan (COZAAR) 50 MG tablet Take 0.5 tablets (25 mg total) by mouth 2 (two) times daily.  90 tablet  3  . meclizine (ANTIVERT) 25 MG tablet Take 25 mg by mouth 3 (three) times daily as needed for dizziness.      . mexiletine (MEXITIL) 150 MG capsule Take 150 mg by mouth 3 (three) times daily.      . Multiple Vitamin (MULITIVITAMIN WITH MINERALS) TABS Take 1  tablet by mouth daily.      . Omega-3 Fatty Acids (FISH OIL) 1000 MG CAPS Take 1,000 mg by mouth 2 (two) times daily.       . rosuvastatin (CRESTOR) 20 MG tablet Take 20 mg by mouth 2 (two) times a week. On Tuesday and Saturdays      . sotalol (BETAPACE) 80 MG tablet Take 80 mg by mouth 2 (two) times daily.      [name] warfarin (COUMADIN) 2.5 MG tablet Take 2.5-3.75 mg by mouth daily. Take 1 tablet everyday except on Friday take tablet and a half (3.75mg )      . ALPRAZolam (XANAX) 0.25 MG tablet Take 1 tablet (0.25 mg total) by mouth 4 (four) times daily as needed. For anxiety  360 tablet  1  . Ascorbic Acid (VITAMIN C) 500 MG tablet Take 500 mg by mouth daily.        [name] aspirin 81 MG tablet Take 81 mg by mouth daily.      . carvedilol (COREG) 12.5 MG tablet Take one tablet by mouth twice daily  180 tablet  3    Inpatient Medications:  . antiseptic oral rinse  15 mL Mouth Rinse q12n4p  . [START ON 12/06/2013] aspirin EC  81 mg Oral Daily  . atorvastatin  40 mg Oral q1800  . carvedilol  6.25 mg Oral BID WC  . chlorhexidine  15 mL Mouth Rinse BID  . furosemide  40 mg Intravenous BID  . [START ON 12/06/2013] levothyroxine  132 mcg Oral Once per day on Thu  . levothyroxine  88 mcg Oral Once per day on Sun Mon Tue Wed Fri Sat  . mexiletine  150 mg Oral TID  . sotalol  80 mg Oral BID    Allergies:  Allergies  Allergen Reactions  . Iohexol      Code: HIVES, Desc: hives w/ itching during cardiac cath '99, mult caths since w/ ? premeds, DR.G.HAYES REQUESTS 13 HR PRE MED//A.C.pt okay w/13 hr prep/mms, Onset Date: [id number]   . Prednisone Other (See Comments)    Hives    History   Social History  . Marital Status: Married    Spouse Name: N/A    Number of Children: N/A  . Years of Education: N/A   Occupational History  . retired      used to Armed forces operational officer   Social History Main Topics  . Smoking status: Former Research scientist (life sciences)  . Smokeless tobacco: Former Systems developer    Quit date: 06/01/1987  . Alcohol  Use: Yes     Comment: very rare  .  Drug Use: No  . Sexual Activity: Not Currently   Other Topics Concern  . Not on file   Social History Narrative  . No narrative on file     Family History  Problem Relation Age of Onset  . Stroke Mother   . Heart attack Father   . Heart attack Mother     BP 105/52  Pulse 78  Temp(Src) 98.1 F (36.7 C) (Oral)  Resp 22  Ht 5\' 8"  (1.727 m)  Wt 180 lb 8.9 oz (81.9 kg)  BMI 27.46 kg/m2  SpO2 99%  Physical Exam: Well developed and nourished in mod resp distress wearing O2 HENT normal Neck supple with JVP-8-10  +HJR Carotids brisk and full without bruits Clear laterally Regular rate and rhythm, no murmurs or gallops Abd-soft with active BS without hepatomegaly No Clubbing cyanosis edema Skin-warm and dry A & Oriented  Grossly normal sensory and motor function   Labs:   Lab Results  Component Value Date   WBC 8.8 12/05/2013   HGB 17.1* 12/05/2013   HCT 49.6 12/05/2013   MCV 93.9 12/05/2013   PLT 256 12/05/2013     Recent Labs Lab 12/05/13 0047 12/05/13 0058  NA 133* 133*  K 5.8* 5.6*  CL 93* 97  CO2 22  --   BUN 18 20  CREATININE 1.53* 1.70*  CALCIUM 9.4  --   GLUCOSE 265* 258*   Lab Results  Component Value Date   CKTOTAL 49 04/07/2011   CKMB 2.5 04/07/2011   TROPONINI <0.30 12/05/2013     Radiology/Studies: Dg Chest Port 1 View 12/05/2013   CLINICAL DATA:  Shortness of breath.  Cough.  EXAM: PORTABLE CHEST - 1 VIEW  COMPARISON:  12/05/2013.  FINDINGS: Cardiac pacer and pacing wires are in stable position. A pacing wire is noted coursing into the right subclavian vein and looping back into the superior vena cava and right atrial region. Cardiomegaly. Diffuse bilateral pulmonary alveolar infiltrates noted most consistent with pulmonary edema. Pneumonia cannot be excluded. No pleural effusion or pneumothorax. No acute osseus abnormality.  IMPRESSION: Persistent changes of congestive heart failure with pulmonary edema. Other etiologies  of pulmonary infiltrates including pneumonia cannot be excluded. Chest x-ray is unchanged from prior exam.   Electronically Signed   By: Marcello Moores  Register   On: 12/05/2013 07:58   EKG:AV pacing  TELEMETRY: AV pacing  DEVICE HISTORY: STJ dual chamber ICD implanted 2004; upgrade to STJ CRTD 07-29-2010    Will enroll in ICM device clinic for more frequent monitoring of fluid status.  The patient has been educated about the program, importance of daily weights, and agrees to participate.  Will have ICM clinic nurse call patient weekly X3 weeks then change to monthly if stable.   Principal Problem:   Acute respiratory failure Active Problems:   Cardiomyopathy, ischemic   SVT (supraventricular tachycardia)   Sinus bradycardia   Cardiac arrest   Hyperkalemia   Acute renal insufficiency   Acute on chronic systolic heart failure  This appears to likely be a primary arrhtyhmic issue with the onset of slow SVT ( but too fast for him)  The device recognized it as such; it actually tried to stop it and this gave rise to short runs of slef terminating ventricular tachycardia Its persistence seems likely to have caused his respiratory failure.  Troponin neg so am not inclined at this point to cath, esp with Cr elevated.  His hyperkalemia likey 2/2 epleronone although acutely could be caused by  last PM event.  For now would stop and follow K and renal function He also has had more heart failure of late, mostly right sided characterized by early satiety, bending difficulties and now with evidence of JVP elevation  Agree with gentle diruesis.   The question is how many of the confluence of K, CHF ? Ischemic heart disease gives rise to cause this singular problem For now would watch.  If were to recur would have dr Greggory Brandy consider ablation as it was a potentially lethal rhtyhm  Once we have some idea of where is CR will settle will address the issue of sotalol  Dosing  For now continue 80- bid  His sinus brady  and 100% atrial pacing clarifies that this was not SVT and the descriptions above (HPI) explain why not VT Will also want to reprogram to continue to withhold ATP for SVt

## 2013-12-05 NOTE — ED Notes (Signed)
Dr. Sharol Given reports bounding pulses in the left foot and unable to obtain pulses in the right foot either by palpation or doppler.  Left femoral pulse weak.

## 2013-12-05 NOTE — ED Notes (Signed)
Blood is ready per blood bank.

## 2013-12-05 NOTE — ED Provider Notes (Signed)
CSN: [id number]     Arrival date & time 12/05/13  0025 History   First MD Initiated Contact with Patient 12/05/13 0036     Chief Complaint  Patient presents with  . Respiratory Distress  . Post CPR      (Consider location/radiation/quality/duration/timing/severity/associated sxs/prior Treatment) HPI 73 yo male presents to the ER via EMS from home after cardiac arrest.  Pt's wife and EMS gives most of history.  Wife reports about 45 min prior to her calling 911, pt was complaining of shortness of breath.  Pt has h/o CAD, CHF, EF 20-25%, Vtach, ICD.  Pt was c/o elevated HR and mild elevation in BP with sob.  When EMS arrived, pt quickly became unresponsive, and lost pulses.  First responders started CPR.  When ambulance arrived, pt was responsive, pale, diaphoretic, mottled with respirations being assisted by BVM.  Pt denies chest pain. Past Medical History  Diagnosis Date  . CAD (coronary artery disease) 1999    Remote anterolater and apical MI, cath 2011 patent stents RCA/LCx  LAD w/o obstruction  . Cardiomyopathy, ischemic     EF 20-25%  echo 2012  . Chronic systolic congestive heart failure   . ICD (implantable cardiac defibrillator) in place     [place] [facility]  . 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 Abdominal aortic aneurysm 2009    Repaired with endovascular stent  . PVD (peripheral vascular disease)     Has occluded innominate vein  . Myocardial infarction   . Hypertension   . Ventricular tachycardia     recurrent slow VT at 100-110 bpm,  Rx mexilitene/sotalol  . Left bundle branch block   . Systolic CHF, chronic   . Arthritis   . Gynecomastia     2/2 spironolactone   Past Surgical History  Procedure Laterality Date  . Icd  Feb 2012    Change out with LV lead placed with tunneling from the right  to the left side  . Endovascular stent insertion      for AAA  . Cardiac catheterization      x2  . Pacemaker insertion    . Ventricular ablation surgery      s/p prior EPS  and ablation at [facility] 9/12, [facility] 11/12, and most recent at [facility] 08/23/11  . Tonsillectomy     Family History  Problem Relation Age of Onset  . Stroke Mother   . Heart attack Father   . Heart attack Mother    History  Substance Use Topics  . Smoking status: Former Research scientist (life sciences)  . Smokeless tobacco: Former Systems developer    Quit date: 06/01/1987  . Alcohol Use: Yes     Comment: very rare    Review of Systems  Unable to perform ROS: Acuity of condition      Allergies  Iohexol and Prednisone  Home Medications   Prior to Admission medications   Medication Sig Start Date End Date Taking? Authorizing Provider  ALPRAZolam (XANAX) 0.25 MG tablet Take 1 tablet (0.25 mg total) by mouth 4 (four) times daily as needed. For anxiety 11/08/13   Darlin Coco, MD  Ascorbic Acid (VITAMIN C) 500 MG tablet Take 500 mg by mouth daily.      Historical Provider, MD  aspirin 81 MG tablet Take 81 mg by mouth daily.    Historical Provider, MD  carvedilol (COREG) 12.5 MG tablet Take one tablet by mouth twice daily 03/15/13   Darlin Coco, MD  eplerenone [facility])  25 MG tablet Take 25 mg by mouth as directed. 1/2 tablet daily 10/25/12   Burtis Junes, NP  furosemide (LASIX) 20 MG tablet Take 20 mg by mouth as needed. Prn weight gain of 3 pounds overnight    Historical Provider, MD  levothyroxine (SYNTHROID, LEVOTHROID) 88 MCG tablet Take 88 mcg by mouth daily. Take 1 and 1/2 pill on Thursday    Historical Provider, MD  Loratadine (CLARITIN) 10 MG CAPS Take 1 capsule by mouth daily.     Historical Provider, MD  losartan (COZAAR) 50 MG tablet Take 0.5 tablets (25 mg total) by mouth 2 (two) times daily. 12/03/11   Deboraha Sprang, MD  losartan (COZAAR) 50 MG tablet Take 0.5 tablets (25 mg total) by mouth 2 (two) times daily. 10/02/13   Darlin Coco, MD  meclizine (ANTIVERT) 25 MG tablet Take 1 tablet (25 mg total) by mouth 3 (three) times daily as needed. 04/24/13   Darlin Coco, MD  mexiletine  (MEXITIL) 150 MG capsule TAKE 1 CAPSULE THREE TIMES A DAY    Darlin Coco, MD  Multiple Vitamin (MULITIVITAMIN WITH MINERALS) TABS Take 1 tablet by mouth daily.    Historical Provider, MD  Omega-3 Fatty Acids (FISH OIL) 1000 MG CAPS Take 1,000 mg by mouth 2 (two) times daily.     Historical Provider, MD  rosuvastatin (CRESTOR) 5 MG tablet Take 1 tablet twice a week or as directed 05/01/13   Deboraha Sprang, MD  sotalol (BETAPACE) 80 MG tablet TAKE 1 TABLET TWICE A DAY 11/22/13   Darlin Coco, MD  warfarin (COUMADIN) 2.5 MG tablet TAKE AS DIRECTED BY ANTICOAGULATION CLINIC 08/24/13   Darlin Coco, MD   BP 149/73  Pulse 67  Resp 32  SpO2 62% Physical Exam  Nursing note and vitals reviewed. Constitutional: He appears distressed.  HENT:  Head: Normocephalic and atraumatic.  Nasal trumpet right nare, pink frothy sputum noted  Neck: Normal range of motion. Neck supple. No JVD present. No tracheal deviation present. No thyromegaly present.  Cardiovascular: Normal rate, regular rhythm and normal heart sounds.  Exam reveals no gallop and no friction rub.   No murmur heard. Bounding pulse left femoral, radial, pedal  Initally absent right pedal with weaker femoral on right, recheck after 20 min shows weak right pedal  Pulmonary/Chest: No stridor. He is in respiratory distress. He has wheezes.  Abdominal: Soft. Bowel sounds are normal. He exhibits no distension and no mass. There is no tenderness. There is no rebound and no guarding.  Musculoskeletal: Normal range of motion. He exhibits no edema and no tenderness.  Lymphadenopathy:    He has no cervical adenopathy.  Neurological: He is alert.  confused  Skin: No rash noted. He is diaphoretic. No erythema. There is pallor.  mottled    ED Course  Procedures (including critical care time)  CRITICAL CARE Performed by: Kalman Drape Total critical care time: 90 min Critical care time was exclusive of separately billable procedures and  treating other patients. Critical care was necessary to treat or prevent imminent or life-threatening deterioration. Critical care was time spent personally by me on the following activities: development of treatment plan with patient and/or surrogate as well as nursing, discussions with consultants, evaluation of patient's response to treatment, examination of patient, obtaining history from patient or surrogate, ordering and performing treatments and interventions, ordering and review of laboratory studies, ordering and review of radiographic studies, pulse oximetry and re-evaluation of patient's condition.  Labs Review Labs Reviewed  CBC WITH DIFFERENTIAL - Abnormal; Notable for the following:    WBC 11.2 (*)    All other components within normal limits  PROTIME-INR - Abnormal; Notable for the following:    Prothrombin Time 23.7 (*)    INR 2.12 (*)    All other components within normal limits  I-STAT CG4 LACTIC ACID, ED - Abnormal; Notable for the following:    Lactic Acid, Venous 6.50 (*)    All other components within normal limits  I-STAT CHEM 8, ED - Abnormal; Notable for the following:    Sodium 133 (*)    Potassium 5.6 (*)    Creatinine, Ser 1.70 (*)    Glucose, Bld 258 (*)    Hemoglobin 18.7 (*)    HCT 55.0 (*)    All other components within normal limits  I-STAT TROPOININ, ED - Abnormal; Notable for the following:    Troponin i, poc 0.09 (*)    All other components within normal limits  I-STAT ARTERIAL BLOOD GAS, ED - Abnormal; Notable for the following:    pH, Arterial 7.213 (*)    pO2, Arterial 55.0 (*)    Bicarbonate 17.9 (*)    Acid-base deficit 10.0 (*)    All other components within normal limits  BASIC METABOLIC PANEL  TROPONIN I  PRO B NATRIURETIC PEPTIDE    Imaging Review Dg Chest Portable 1 View  12/05/2013   CLINICAL DATA:  Shortness of breath.  Post CPR.  EXAM: PORTABLE CHEST - 1 VIEW  COMPARISON:  04/07/2011  FINDINGS: Cardiac pacemaker. Cardiac  enlargement with mild pulmonary vascular congestion. Diffuse airspace disease in the lungs likely represents edema or pneumonia. No blunting of costophrenic angles. No pneumothorax.  IMPRESSION: Cardiac enlargement with pulmonary vascular congestion and bilateral airspace disease, likely edema.   Electronically Signed   By: Lucienne Capers M.D.   On: 12/05/2013 01:15     EKG Interpretation   Date/Time:  Wednesday December 05 2013 00:37:43 EDT Ventricular Rate:  70 PR Interval:  200 QRS Duration: 153 QT Interval:  445 QTC Calculation: 480 R Axis:   -95 Text Interpretation:  AV PACED RHYTHM Right bundle branch block Lateral  leads are also involved Confirmed by Abryanna Musolino  MD, Nollan Muldrow ([id number]) on 12/05/2013  12:55:59 AM      MDM   Final diagnoses:  Cardiomyopathy, ischemic  Chronic systolic heart failure  Cardiac arrest  Pulmonary edema cardiac cause    73 yo male post arrest with ROSC.  Pt placed on bipap, lasix ordered.  CXR with pulmonary edema.  EKG concerning for STEMI, d/w Dr Tamala Julian on call for interventional.  Pt with paced rhythm, but with increased ST elevation in inferior leads compared with prior.  Dr Tamala Julian feels that since pt does not have chest pain, will hold on immediate cath at this time.  Plan for admission to cardiology ICU, critical care and cardiology have seen patient.    Kalman Drape, MD 12/05/13 [phone]

## 2013-12-05 NOTE — Progress Notes (Signed)
Chaplain received referral for post-CPR, to support pt's wife. Escorted her to consultation room A and was present with her during physician's update. Patient and his wife have two sons who live out of town, pt's wife described a strong support group of local friends, but said they do not attend church. She expressed a clear understanding of husband's condition and anxiety over his worsening health. She is now at pt's bedside and was grateful for support. Will refer to unit chaplain. Please page if needed.   Ethelene Browns [phone]

## 2013-12-05 NOTE — Progress Notes (Signed)
IPAP and EPAP ordered by Dr Titus Mould.

## 2013-12-05 NOTE — Progress Notes (Signed)
ANTICOAGULATION CONSULT NOTE - [place] for Heparin  Indication: chest pain/ACS  Allergies  Allergen Reactions  . Iohexol      Code: HIVES, Desc: hives w/ itching during cardiac cath '99, mult caths since w/ ? premeds, DR.G.HAYES REQUESTS 13 HR PRE MED//A.C.pt okay w/13 hr prep/mms, Onset Date: [id number]   . Prednisone Other (See Comments)    Hives    Patient Measurements: Height: 5\' 8"  (172.7 cm) Weight: 180 lb 8.9 oz (81.9 kg) IBW/kg (Calculated) : 68.4  Vital Signs: Temp: 98.4 F (36.9 C) (07/08 0700) Temp src: Axillary (07/08 0700) BP: 106/55 mmHg (07/08 0800) Pulse Rate: 73 (07/08 0800)  Labs:  Recent Labs  12/05/13 0047 12/05/13 0058 12/05/13 0335 12/05/13 0338 12/05/13 0800  HGB 16.4 18.7* 17.1*  --   --   HCT 49.2 55.0* 49.6  --   --   PLT 267  --  256  --   --   APTT  --   --   --   --  30  LABPROT 23.7*  --   --   --  23.2*  INR 2.12*  --   --   --  2.06*  CREATININE 1.53* 1.70*  --   --   --   TROPONINI 0.32*  --   --  <0.30  --     Estimated Creatinine Clearance: 38 ml/min (by C-G formula based on Cr of 1.7).  Medications:  Warfarin PTA  Assessment: 73 y/o M with BIV AICD, s/p arrest, on warfarin PTA with INR of 2.12 on admit. Last dose PTA was taken 7/7 at unknown time. This morning INR 2.06. CBC is stable with no bleeding noted. Per protocol will start heparin when INR <2.  Goal of Therapy:  Heparin level 0.3-0.7 units/ml Monitor platelets by anticoagulation protocol: Yes   Plan:  1. Re-check INR tomorrow morning to ensure the dose from 7/7 is not fully reflected at that time and results in elevated INR 2. Start heparin when INR <2 (likely tomorrow) 3. Follow for s/s bleeding  Shelda Truby D. Godson Pollan, PharmD, BCPS Clinical Pharmacist Pager: [phone] 12/05/2013 10:29 AM

## 2013-12-06 ENCOUNTER — Encounter (HOSPITAL_COMMUNITY): Payer: Self-pay | Admitting: Radiology

## 2013-12-06 ENCOUNTER — Inpatient Hospital Stay (HOSPITAL_COMMUNITY): Payer: Medicare Other

## 2013-12-06 DIAGNOSIS — I5023 Acute on chronic systolic (congestive) heart failure: Secondary | ICD-10-CM | POA: Diagnosis not present

## 2013-12-06 DIAGNOSIS — J9600 Acute respiratory failure, unspecified whether with hypoxia or hypercapnia: Secondary | ICD-10-CM | POA: Diagnosis not present

## 2013-12-06 DIAGNOSIS — I469 Cardiac arrest, cause unspecified: Secondary | ICD-10-CM | POA: Diagnosis not present

## 2013-12-06 DIAGNOSIS — I359 Nonrheumatic aortic valve disorder, unspecified: Secondary | ICD-10-CM

## 2013-12-06 DIAGNOSIS — I498 Other specified cardiac arrhythmias: Secondary | ICD-10-CM | POA: Diagnosis not present

## 2013-12-06 DIAGNOSIS — N289 Disorder of kidney and ureter, unspecified: Secondary | ICD-10-CM

## 2013-12-06 LAB — PROTIME-INR
INR: 1.91 — ABNORMAL HIGH (ref 0.00–1.49)
Prothrombin Time: 21.9 seconds — ABNORMAL HIGH (ref 11.6–15.2)

## 2013-12-06 LAB — BASIC METABOLIC PANEL
Anion gap: 14 (ref 5–15)
BUN: 31 mg/dL — ABNORMAL HIGH (ref 6–23)
CHLORIDE: 97 meq/L (ref 96–112)
CO2: 23 meq/L (ref 19–32)
Calcium: 8.8 mg/dL (ref 8.4–10.5)
Creatinine, Ser: 1.43 mg/dL — ABNORMAL HIGH (ref 0.50–1.35)
GFR calc Af Amer: 55 mL/min — ABNORMAL LOW (ref >90)
GFR calc non Af Amer: 47 mL/min — ABNORMAL LOW (ref >90)
GLUCOSE: 128 mg/dL — AB (ref 70–99)
POTASSIUM: 4.1 meq/L (ref 3.7–5.3)
SODIUM: 134 meq/L — AB (ref 137–147)

## 2013-12-06 LAB — CBC
HEMATOCRIT: 40 % (ref 39.0–52.0)
HEMOGLOBIN: 13.8 g/dL (ref 13.0–17.0)
MCH: 31.7 pg (ref 26.0–34.0)
MCHC: 34.5 g/dL (ref 30.0–36.0)
MCV: 91.7 fL (ref 78.0–100.0)
Platelets: 178 10*3/uL (ref 150–400)
RBC: 4.36 MIL/uL (ref 4.22–5.81)
RDW: 13 % (ref 11.5–15.5)
WBC: 14.8 10*3/uL — AB (ref 4.0–10.5)

## 2013-12-06 LAB — TSH: TSH: 2.04 u[IU]/mL (ref 0.350–4.500)

## 2013-12-06 LAB — TROPONIN I: Troponin I: <0.3 ng/mL (ref <0.30)

## 2013-12-06 MED ORDER — ALPRAZOLAM 0.25 MG PO TABS: 0.2500 mg | ORAL_TABLET | Freq: Three times a day (TID) | ORAL | Status: DC | PRN

## 2013-12-06 MED ORDER — PERFLUTREN LIPID MICROSPHERE
INTRAVENOUS | Status: AC
  Administered 2013-12-06: 11:00:00
  Filled 2013-12-06: qty 10

## 2013-12-06 MED ORDER — PERFLUTREN LIPID MICROSPHERE
1.0000 mL | INTRAVENOUS | Status: AC | PRN
  Filled 2013-12-06: qty 10

## 2013-12-06 MED ORDER — FUROSEMIDE 40 MG PO TABS
40.0000 mg | ORAL_TABLET | Freq: Every day | ORAL | Status: DC
  Filled 2013-12-06: qty 1

## 2013-12-06 MED ORDER — ALPRAZOLAM 0.25 MG PO TABS
0.2500 mg | ORAL_TABLET | Freq: Three times a day (TID) | ORAL | Status: DC
  Administered 2013-12-06 – 2013-12-08 (×6): 0.25 mg via ORAL
  Filled 2013-12-06 (×6): qty 1

## 2013-12-06 NOTE — Progress Notes (Addendum)
ANTICOAGULATION CONSULT NOTE - Follow Up Consult  Pharmacy Consult for Heparin when INR < 2.0 Indication: chest pain/ACS  Allergies  Allergen Reactions  . Iohexol      Code: HIVES, Desc: hives w/ itching during cardiac cath '99, mult caths since w/ ? premeds, DR.G.HAYES REQUESTS 13 HR PRE MED//A.C.pt okay w/13 hr prep/mms, Onset Date: [id number]   . Prednisone Other (See Comments)    Hives    Patient Measurements: Height: 5\' 8"  (172.7 cm) Weight: 180 lb 8.9 oz (81.9 kg) IBW/kg (Calculated) : 68.4 Heparin Dosing Weight: 82 kg  Vital Signs: Temp: 99 F (37.2 C) (07/09 1608) Temp src: Oral (07/09 1608) BP: 115/54 mmHg (07/09 1608) Pulse Rate: 78 (07/09 1608)  Labs:  Recent Labs  12/05/13 0047 12/05/13 0058 12/05/13 0335  12/05/13 0800 12/05/13 1025 12/05/13 1655 12/05/13 2027 12/06/13 0410 12/06/13 0411  HGB 16.4 18.7* 17.1*  --   --   --   --   --   --  13.8  HCT 49.2 55.0* 49.6  --   --   --   --   --   --  40.0  PLT 267  --  256  --   --   --   --   --   --  178  APTT  --   --   --   --  30  --   --   --   --   --   LABPROT 23.7*  --   --   --  23.2*  --   --   --   --  21.9*  INR 2.12*  --   --   --  2.06*  --   --   --   --  1.91*  CREATININE 1.53* 1.70*  --   --   --   --   --  1.45*  --  1.43*  TROPONINI 0.32*  --   --   < >  --  <0.30 <0.30  --  <0.30  --   < > = values in this interval not displayed.  Estimated Creatinine Clearance: 45.2 ml/min (by C-G formula based on Cr of 1.43).  Assessment:   Coumadin on hold.  INR down to 1.91 today. Prior order to begin IV heparin when INR < 2.0. Spoke briefly with Drs. Crenshaw and Titus Mould this am.  For chest CT today.  Brief hemoptysis this am, r/o infection, mass.  Coumadin for ICM per outpatient anti-coag notes.  Has been on Coumadin 2.5 mg daily, except 3.75 mg on Fridays.  Last dose 7/7 at home.  Goal of Therapy:  Heparin level 0.3-0.7 units/ml Monitor platelets by anticoagulation protocol: Yes   Plan:   Heparin on hold while CT pending.  Coumadin also on hold.  Will follow up for anticoag plans on 7/10, after CT completed. d/w Dr. Titus Mould.  PT/INR daily for now.  Arty Baumgartner, [place] Pager: [phone] 12/06/2013,4:26 PM

## 2013-12-06 NOTE — Progress Notes (Signed)
  Echocardiogram 2D Echocardiogram has been performed.  Adem Costlow 12/06/2013, 11:27 AM

## 2013-12-06 NOTE — Progress Notes (Signed)
PULMONARY / CRITICAL CARE MEDICINE   Name: [name] MRN: [id number] DOB: 1941-04-08    ADMISSION DATE:  12/05/2013 CONSULTATION DATE:  12/05/2013  REFERRING MD :  Marlou Porch PRIMARY SERVICE: Cardiology  CHIEF COMPLAINT:  Post cardiac arrest  BRIEF PATIENT DESCRIPTION: 73 y.o. M with hx of ischemic cardiomyopathy and BiV AICD, brought to ED s/p cardiac arrest. Was initially code STEMI; however, this was cancelled by cardiology upon arrival to ED. Cause of arrest thought to be related to ventricular arrhythmia from cardiomopathy.  In ED, initial ABG 7.21/44.5/55. Pt subsequently placed on BiPAP and  PCCM consulted for assistance.  Continued hypoxia.  SIGNIFICANT EVENTS / STUDIES:  7/8 out of hospital cardiac arrest, successful resuscitation in field.  Thought to be secondary to ventricular arrhythmia 7/8 ABG:  7.21 / 44.5 / 55.  CXR revealed pulm edema.  Started on BiPAP, PCCM consulted. 7/9 neg balance noted 2.3 liters  LINES / TUBES: PIV  CULTURES: None  ANTIBIOTICS: None  SUBJECTIVE: on venti mask  VITAL SIGNS: Temp:  [97.7 F (36.5 C)-99.1 F (37.3 C)] 98.3 F (36.8 C) (07/09 0738) Pulse Rate:  [70-105] 76 (07/09 0700) Resp:  [13-28] 20 (07/09 0700) BP: (81-132)/(41-91) 114/62 mmHg (07/09 0700) SpO2:  [86 %-100 %] 94 % (07/09 0700) FiO2 (%):  [40 %-100 %] 50 % (07/09 0418) HEMODYNAMICS:   VENTILATOR SETTINGS: Vent Mode:  [-]  FiO2 (%):  [40 %-100 %] 50 % INTAKE / OUTPUT: Intake/Output     07/08 0701 - 07/09 0700 07/09 0701 - 07/10 0700   P.O.     Total Intake(mL/kg)     Urine (mL/kg/hr) 2225 (1.1)    Total Output 2225     Net -2225            PHYSICAL EXAMINATION: General: Elderly appearing male, less distress Neuro: Follows commands, anxiety HEENT: jvd present Cardiovascular: RRR, no M/R/G.  Lungs: less crackles Abdomen: BS x 4, soft, NT/ND.  Musculoskeletal: limited edema.  Skin: Intact, warm, no rashes.    LABS:  CBC  Recent Labs Lab  12/05/13 0047 12/05/13 0058 12/05/13 0335 12/06/13 0411  WBC 11.2*  --  8.8 14.8*  HGB 16.4 18.7* 17.1* 13.8  HCT 49.2 55.0* 49.6 40.0  PLT 267  --  256 178   Coag's  Recent Labs Lab 12/05/13 0047 12/05/13 0800 12/06/13 0411  APTT  --  30  --   INR 2.12* 2.06* 1.91*   BMET  Recent Labs Lab 12/05/13 0047 12/05/13 0058 12/05/13 2027 12/06/13 0411  NA 133* 133* 134* 134*  K 5.8* 5.6* 4.3 4.1  CL 93* 97 98 97  CO2 22  --  21 23  BUN 18 20 28* 31*  CREATININE 1.53* 1.70* 1.45* 1.43*  GLUCOSE 265* 258* 117* 128*   Electrolytes  Recent Labs Lab 12/05/13 0047 12/05/13 2027 12/06/13 0411  CALCIUM 9.4 8.6 8.8   Sepsis Markers  Recent Labs Lab 12/05/13 0059  LATICACIDVEN 6.50*   ABG  Recent Labs Lab 12/05/13 0254 12/05/13 0415 12/05/13 0806  PHART 7.297* 7.318* 7.364  PCO2ART 39.7 34.8* 36.6  PO2ART 60.0* 105.0* 151.0*   Liver Enzymes No results found for this basename: AST, ALT, ALKPHOS, BILITOT, ALBUMIN,  in the last 168 hours Cardiac Enzymes  Recent Labs Lab 12/05/13 0047  12/05/13 1025 12/05/13 1655 12/06/13 0410  TROPONINI 0.32*  < > <0.30 <0.30 <0.30  PROBNP 2438.0*  --   --   --   --   < > =  values in this interval not displayed. Glucose No results found for this basename: GLUCAP,  in the last 168 hours  Imaging Dg Chest Port 1 View  12/05/2013   CLINICAL DATA:  Shortness of breath.  Cough.  EXAM: PORTABLE CHEST - 1 VIEW  COMPARISON:  12/05/2013.  FINDINGS: Cardiac pacer and pacing wires are in stable position. A pacing wire is noted coursing into the right subclavian vein and looping back into the superior vena cava and right atrial region. Cardiomegaly. Diffuse bilateral pulmonary alveolar infiltrates noted most consistent with pulmonary edema. Pneumonia cannot be excluded. No pleural effusion or pneumothorax. No acute osseus abnormality.  IMPRESSION: Persistent changes of congestive heart failure with pulmonary edema. Other etiologies of  pulmonary infiltrates including pneumonia cannot be excluded. Chest x-ray is unchanged from prior exam.   Electronically Signed   By: [place]   On: 12/05/2013 07:58   Dg Chest Portable 1 View  12/05/2013   CLINICAL DATA:  Shortness of breath.  Post CPR.  EXAM: PORTABLE CHEST - 1 VIEW  COMPARISON:  04/07/2011  FINDINGS: Cardiac pacemaker. Cardiac enlargement with mild pulmonary vascular congestion. Diffuse airspace disease in the lungs likely represents edema or pneumonia. No blunting of costophrenic angles. No pneumothorax.  IMPRESSION: Cardiac enlargement with pulmonary vascular congestion and bilateral airspace disease, likely edema.   Electronically Signed   By: Lucienne Capers M.D.   On: 12/05/2013 01:15    ASSESSMENT / PLAN:  PULMONARY A: Pulmonary Edema Protecting airway on own though at risk intubation Hypoxic resp failure P:   - consider dc a line -no work of breathing noted, dc bipap -O2 to sats goal 92% -lasix per cards, renal fxn has tolerated this -will assess pcxr in am  -goal to Nasal canula today -mobilize if ok with EP  CARDIOVASCULAR A:  S/p cardiac arrest - thought secondary to ventricular arrhythmia. CHF exacerbation - last echo 10/25/2012 - EF 25%. Ischemic cardiomyopathy - trop neg CAD HTN P:  - Manager per cards and EP note reviewed. -lasix to neg balance, some caution crt  RENAL A:    Hyponatremia - likely CHF/volume overload.. AKI AG metabolic acidosis - lactate, resolved P:   - Lasix per cards - BMP to am   GASTROINTESTINAL A:  Nutrition P:   - advance diet -ppi  HEMATOLOGIC A:   Polycythemia anticoagulated P:  - CBC in AM as we restart coum  INFECTIOUS A:   No evidence of infection P:   - follow temp curve  ENDOCRINE A:  Hyperthyoridism P:   - Continue synthroid, ensure tsh wnl - 2.0  NEUROLOGIC A:  NIMV now off, anxiety P:   -home benzo additions   Global: he is better, EP managing meds, xanax needed, lower  O2 as able, ambulate soon, lasix per cards, off BIPAP  Lavon Paganini. Titus Mould, MD, [place] Pgr: [facility]  Pulmonary and [place] Pager: [phone]  12/06/2013, 7:55 AM

## 2013-12-06 NOTE — Progress Notes (Signed)
    Subjective:  Denies CP or dyspnea; residual chest soreness from CPR.   Objective:  Filed Vitals:   12/06/13 0500 12/06/13 0536 12/06/13 0700 12/06/13 0738  BP:   114/62   Pulse: 74 74 76   Temp:    98.3 F (36.8 C)  TempSrc:    Oral  Resp: 16 13 20    Height:      Weight:      SpO2: 93% 95% 94%     Intake/Output from previous day:  Intake/Output Summary (Last 24 hours) at 12/06/13 0849 Last data filed at 12/06/13 0600  Gross per 24 hour  Intake      0 ml  Output   2105 ml  Net  -2105 ml    Physical Exam: Physical exam: Well-developed well-nourished in no acute distress.  Skin is warm and dry.  HEENT is normal.  Neck is supple.  Chest is clear to auscultation with normal expansion.  Cardiovascular exam is regular rate and rhythm.  Abdominal exam nontender or distended. No masses palpated. Extremities show no edema. neuro grossly intact    Lab Results: Basic Metabolic Panel:  Recent Labs  12/05/13 2027 12/06/13 0411  NA 134* 134*  K 4.3 4.1  CL 98 97  CO2 21 23  GLUCOSE 117* 128*  BUN 28* 31*  CREATININE 1.45* 1.43*  CALCIUM 8.6 8.8   CBC:  Recent Labs  12/05/13 0047  12/05/13 0335 12/06/13 0411  WBC 11.2*  --  8.8 14.8*  NEUTROABS 7.5  --  7.1  --   HGB 16.4  < > 17.1* 13.8  HCT 49.2  < > 49.6 40.0  MCV 95.9  --  93.9 91.7  PLT 267  --  256 178  < > = values in this interval not displayed. Cardiac Enzymes:  Recent Labs  12/05/13 1025 12/05/13 1655 12/06/13 0410  TROPONINI <0.30 <0.30 <0.30     Assessment/Plan:  1 acute on chronic systolic congestive heart failure- patient markedly improved this morning. Change Lasix to 40 mg by mouth daily. Continue carvedilol. ARB on hold for now as blood pressure is borderline and renal function worse than baseline. Will resume later as tolerated. Repeat echocardiogram. 2 status post cardiac arrest-Dr. Caryl Comes has reviewed and feels this is a primary arrhythmic event related to supraventricular  tachycardia. Therefore will not pursue cardiac catheterization. Continue sotalol and mexiletine. Continue beta blocker. We'll await further input from electrophysiology concerning device management. 3 acute on chronic renal failure-watch renal function. Decrease Lasix to 40 mg daily. 4 hemoptysis-patient had brief hemoptysis this morning. Pulmonary has ordered a noncontrast chest CT. Possibly related to CPR at time of cardiac arrest with elevated INR. 5 chronic anticoagulation-I am not clear as to the reason for his long-term anticoagulation. I find no documentation of atrial fibrillation or pulmonary embolus. He will review this with Dr. Mare Ferrari following discharge. 6 coronary artery disease-discontinue aspirin given need for Coumadin. Intolerant to statins. 7 prior ICD  Kirk Ruths 12/06/2013, 8:49 AM

## 2013-12-07 ENCOUNTER — Inpatient Hospital Stay (HOSPITAL_COMMUNITY): Payer: Medicare Other

## 2013-12-07 LAB — CBC
HEMATOCRIT: 39.8 % (ref 39.0–52.0)
Hemoglobin: 13.4 g/dL (ref 13.0–17.0)
MCH: 31.7 pg (ref 26.0–34.0)
MCHC: 33.7 g/dL (ref 30.0–36.0)
MCV: 94.1 fL (ref 78.0–100.0)
PLATELETS: 199 10*3/uL (ref 150–400)
RBC: 4.23 MIL/uL (ref 4.22–5.81)
RDW: 13.1 % (ref 11.5–15.5)
WBC: 13.2 10*3/uL — AB (ref 4.0–10.5)

## 2013-12-07 LAB — BASIC METABOLIC PANEL
Anion gap: 15 (ref 5–15)
BUN: 31 mg/dL — AB (ref 6–23)
CO2: 27 meq/L (ref 19–32)
CREATININE: 1.23 mg/dL (ref 0.50–1.35)
Calcium: 9 mg/dL (ref 8.4–10.5)
Chloride: 96 mEq/L (ref 96–112)
GFR calc Af Amer: 66 mL/min — ABNORMAL LOW (ref >90)
GFR calc non Af Amer: 57 mL/min — ABNORMAL LOW (ref >90)
Glucose, Bld: 113 mg/dL — ABNORMAL HIGH (ref 70–99)
Potassium: 4 mEq/L (ref 3.7–5.3)
Sodium: 138 mEq/L (ref 137–147)

## 2013-12-07 LAB — PROTIME-INR
INR: 1.52 — ABNORMAL HIGH (ref 0.00–1.49)
Prothrombin Time: 18.3 seconds — ABNORMAL HIGH (ref 11.6–15.2)

## 2013-12-07 LAB — MAGNESIUM: MAGNESIUM: 2.2 mg/dL (ref 1.5–2.5)

## 2013-12-07 LAB — PHOSPHORUS: Phosphorus: 2.2 mg/dL — ABNORMAL LOW (ref 2.3–4.6)

## 2013-12-07 MED ORDER — DOCUSATE SODIUM 100 MG PO CAPS
100.0000 mg | ORAL_CAPSULE | Freq: Every day | ORAL | Status: DC
  Administered 2013-12-07 – 2013-12-08 (×2): 100 mg via ORAL
  Filled 2013-12-07 (×2): qty 1

## 2013-12-07 MED ORDER — ENOXAPARIN SODIUM 40 MG/0.4ML ~~LOC~~ SOLN
40.0000 mg | SUBCUTANEOUS | Status: DC
  Administered 2013-12-07: 40 mg via SUBCUTANEOUS
  Filled 2013-12-07 (×3): qty 0.4

## 2013-12-07 MED ORDER — FUROSEMIDE 20 MG PO TABS
20.0000 mg | ORAL_TABLET | Freq: Every day | ORAL | Status: DC
  Administered 2013-12-07 – 2013-12-08 (×2): 20 mg via ORAL
  Filled 2013-12-07 (×2): qty 1

## 2013-12-07 MED ORDER — TRAMADOL HCL 50 MG PO TABS
50.0000 mg | ORAL_TABLET | Freq: Two times a day (BID) | ORAL | Status: DC | PRN
  Administered 2013-12-07 – 2013-12-08 (×2): 50 mg via ORAL
  Filled 2013-12-07 (×2): qty 1

## 2013-12-07 NOTE — Progress Notes (Signed)
PULMONARY / CRITICAL CARE MEDICINE   Name: [name] MRN: [id number] DOB: 10-19-1940    ADMISSION DATE:  12/05/2013 CONSULTATION DATE:  12/05/2013  REFERRING MD :  Marlou Porch PRIMARY SERVICE: Cardiology  CHIEF COMPLAINT:  Post cardiac arrest  BRIEF PATIENT DESCRIPTION: 73 y.o. M with hx of ischemic cardiomyopathy and BiV AICD, brought to ED s/p cardiac arrest. Was initially code STEMI; however, this was cancelled by cardiology upon arrival to ED. Cause of arrest thought to be related to ventricular arrhythmia from cardiomopathy.  In ED, initial ABG 7.21/44.5/55. Pt subsequently placed on BiPAP and  PCCM consulted for assistance.  Continued hypoxia.  SIGNIFICANT EVENTS / STUDIES:  7/8 out of hospital cardiac arrest, successful resuscitation in field.  Thought to be secondary to ventricular arrhythmia 7/8 ABG:  7.21 / 44.5 / 55.  CXR revealed pulm edema.  Started on BiPAP, PCCM consulted. 7/9 neg balance noted 2.3 liters 7/9 ct chest>>>ground glass - edema, 2.6 cm lobulated soft tissue mass at the neck base anteriorly<BR>adjacent to the jugular notch. This is nonspecific  LINES / TUBES: PIV  CULTURES: None  ANTIBIOTICS: None  SUBJECTIVE: no further hemoptyisis, 2 lit o2  VITAL SIGNS: Temp:  [97.7 F (36.5 C)-99 F (37.2 C)] 98.1 F (36.7 C) (07/10 1127) Pulse Rate:  [72-81] 74 (07/10 1000) Resp:  [13-24] 22 (07/10 1127) BP: (88-146)/(29-73) 104/41 mmHg (07/10 1127) SpO2:  [90 %-100 %] 92 % (07/10 1127) HEMODYNAMICS:   VENTILATOR SETTINGS:   INTAKE / OUTPUT: Intake/Output     07/09 0701 - 07/10 0700 07/10 0701 - 07/11 0700   P.O. 960 350   Total Intake(mL/kg) 960 (11.7) 350 (4.3)   Urine (mL/kg/hr) 1925 (1) 150 (0.4)   Total Output 1925 150   Net -965 +200          PHYSICAL EXAMINATION: General: Elderly appearing male, no distress Neuro: Follows commands, nonfocal HEENT: jvd present but less Cardiovascular: RRR, no M/R/G.  Lungs:  Coarse mild Abdomen: BS x  4, soft, NT/ND.  Musculoskeletal: limited edema.  Skin: Intact, warm, no rashes.    LABS:  CBC  Recent Labs Lab 12/05/13 0335 12/06/13 0411 12/07/13 0329  WBC 8.8 14.8* 13.2*  HGB 17.1* 13.8 13.4  HCT 49.6 40.0 39.8  PLT 256 178 199   Coag's  Recent Labs Lab 12/05/13 0800 12/06/13 0411 12/07/13 0329  APTT 30  --   --   INR 2.06* 1.91* 1.52*   BMET  Recent Labs Lab 12/05/13 2027 12/06/13 0411 12/07/13 0329  NA 134* 134* 138  K 4.3 4.1 4.0  CL 98 97 96  CO2 21 23 27   BUN 28* 31* 31*  CREATININE 1.45* 1.43* 1.23  GLUCOSE 117* 128* 113*   Electrolytes  Recent Labs Lab 12/05/13 2027 12/06/13 0411 12/07/13 0329  CALCIUM 8.6 8.8 9.0  MG  --   --  2.2  PHOS  --   --  2.2*   Sepsis Markers  Recent Labs Lab 12/05/13 0059  LATICACIDVEN 6.50*   ABG  Recent Labs Lab 12/05/13 0254 12/05/13 0415 12/05/13 0806  PHART 7.297* 7.318* 7.364  PCO2ART 39.7 34.8* 36.6  PO2ART 60.0* 105.0* 151.0*   Liver Enzymes No results found for this basename: AST, ALT, ALKPHOS, BILITOT, ALBUMIN,  in the last 168 hours Cardiac Enzymes  Recent Labs Lab 12/05/13 0047  12/05/13 1025 12/05/13 1655 12/06/13 0410  TROPONINI 0.32*  < > <0.30 <0.30 <0.30  PROBNP 2438.0*  --   --   --   --   < > =  values in this interval not displayed. Glucose No results found for this basename: GLUCAP,  in the last 168 hours  Imaging Ct Chest Wo Contrast  12/06/2013   CLINICAL DATA:  Hemoptysis.  EXAM: CT CHEST WITHOUT CONTRAST  TECHNIQUE: Multidetector CT imaging of the chest was performed following the standard protocol without IV contrast.  COMPARISON:  Chest radiograph, 12/05/2013.  FINDINGS: Lungs show diffuse hazy ground-glass opacity in both upper lobes, portions of the right middle lobe and in both lower lobes. There are areas of coarse reticular type opacity and architectural distortion within this mostly in the anterior upper lobes. Small areas of more confluent opacity are  noted in the lower lobes which may reflect superimposed atelectasis. Lungs show changes of centrilobular and paraseptal emphysema most evident in the upper lobes.  No pleural effusion.  No pneumothorax.  Heart is mildly enlarged. There are dense coronary artery calcifications. 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 Great vessels are normal in caliber. There is a single mildly enlarged superior right peritracheal lymph node measuring 11 mm in short axis. No other adenopathy. No hilar masses or adenopathy.  There is a lobulated soft tissue mass adjacent to the jugular notch at the anterior neck base measuring 2.6 cm x 2.5 cm x 2.6 cm. This is nonspecific. It could reflect thyroid tissue.  Limited evaluation of the upper abdomen shows low-density renal lesions, likely cysts, some dependent material in the gallbladder fundus which may reflect viscus bowel or small layering stones, and mild nodularity of the left adrenal gland. These findings without significant change from the prior CT dated 03/13/2013.  Left anterior chest wall pacemaker is well positioned.  There are minor degenerative changes of the visualized spine. No osteoblastic or osteolytic lesions.  IMPRESSION: 1. Diffuse bilateral ground-glass airspace opacities superimposed on areas of lung scarring and changes of emphysema. Findings could be due to pulmonary edema. A diffuse alveolar inflammatory or infectious infiltrate could have this appearance. 2. Single mildly enlarged right superior paratracheal lymph node measuring 11 mm in short axis, most likely reactive. 3. 2.6 cm lobulated soft tissue mass at the neck base anteriorly adjacent to the jugular notch. This is nonspecific. 4. Other chronic findings as detailed above.   Electronically Signed   By: Lajean Manes M.D.   On: 12/06/2013 20:40   Dg Chest Port 1 View  12/07/2013   CLINICAL DATA:  Assess edema  EXAM: PORTABLE CHEST - 1 VIEW  COMPARISON:   12/05/2013  FINDINGS: The cardiac shadow is stable. A defibrillator is again noted. Lungs are well aerated bilaterally. Significant improvement in the degree of airspace disease is noted. Only minimal residual changes are noted on the right. No new focal infiltrate is seen.  IMPRESSION: Significantly improved airspace disease bilateral with only mild residual changes on the right.   Electronically Signed   By: Inez Catalina M.D.   On: 12/07/2013 07:12    ASSESSMENT / PLAN:  PULMONARY A: Pulmonary Edema Protecting airway on own though at risk intubation Hypoxic resp failure - pulm edema Small mass neck CT c/w edema P:   -CT c/w pulm edema, continued neg balance as directed by cards, no mass, resolved hemoptysis -will recommend US neck correlation, TSH wnl -goal to Nasal canula today -mobilize if ok with EP -can restart anticoagulants if needed  CARDIOVASCULAR A:  S/p cardiac arrest - thought secondary to ventricular arrhythmia. CHF exacerbation - last echo 10/25/2012 - EF 25%. Ischemic cardiomyopathy -  trop neg CAD HTN P:  - Manager per cards -tele  RENAL A:    Hyponatremia - likely CHF/volume overload.. AKI AG metabolic acidosis - lactate, resolved P:   - Lasix per cards  NEUROLOGIC A:  NIMV now off, anxiety P:   -home benzo additions   Global: can restart anticoagulants, US neck, please follow, will sign off, call if needed  Lavon Paganini. Titus Mould, MD, [place] Pgr: [facility]  Pulmonary and [place] Pager: [phone]  12/07/2013, 11:53 AM

## 2013-12-07 NOTE — Progress Notes (Signed)
    Subjective:  Denies CP or dyspnea; residual chest soreness from CPR. Still with hemoptysis   Objective:  Filed Vitals:   12/07/13 0200 12/07/13 0400 12/07/13 0600 12/07/13 0722  BP: 90/49 93/52 88/48  108/61  Pulse: 73 74 78 74  Temp:  97.9 F (36.6 C)  97.7 F (36.5 C)  TempSrc:  Oral  Oral  Resp: 16 17 13 14   Height:      Weight:      SpO2: 92% 97% 97% 99%    Intake/Output from previous day:  Intake/Output Summary (Last 24 hours) at 12/07/13 0800 Last data filed at 12/07/13 0400  Gross per 24 hour  Intake    960 ml  Output   1775 ml  Net   -815 ml    Physical Exam: Physical exam: Well-developed well-nourished in no acute distress.  Skin is warm and dry.  HEENT is normal.  Neck is supple.  Chest CTA Cardiovascular exam is regular rate and rhythm.  Abdominal exam nontender or distended. No masses palpated. Extremities show no edema. neuro grossly intact    Lab Results: Basic Metabolic Panel:  Recent Labs  12/06/13 0411 12/07/13 0329  NA 134* 138  K 4.1 4.0  CL 97 96  CO2 23 27  GLUCOSE 128* 113*  BUN 31* 31*  CREATININE 1.43* 1.23  CALCIUM 8.8 9.0  MG  --  2.2  PHOS  --  2.2*   CBC:  Recent Labs  12/05/13 0047  12/05/13 0335 12/06/13 0411 12/07/13 0329  WBC 11.2*  --  8.8 14.8* 13.2*  NEUTROABS 7.5  --  7.1  --   --   HGB 16.4  < > 17.1* 13.8 13.4  HCT 49.2  < > 49.6 40.0 39.8  MCV 95.9  --  93.9 91.7 94.1  PLT 267  --  256 178 199  < > = values in this interval not displayed. Cardiac Enzymes:  Recent Labs  12/05/13 1025 12/05/13 1655 12/06/13 0410  TROPONINI <0.30 <0.30 <0.30     Assessment/Plan:  1 acute on chronic systolic congestive heart failure- Volume status better. Change Lasix to 20 mg by mouth daily. Continue carvedilol. ARB on hold for now as blood pressure is borderline. Will resume later as tolerated. Echo with EF 20-25. 2 status post cardiac arrest-Dr. Caryl Comes has reviewed and feels this is a primary arrhythmic  event related to supraventricular tachycardia. Therefore will not pursue cardiac catheterization. Continue sotalol and mexiletine. Continue beta blocker. We'll await further input from electrophysiology concerning device management. 3 acute on chronic renal failure-improved this AM; follow 4 hemoptysis-patient had brief hemoptysis previously and recurrence this AM. Chest CT results noted. Possibly related to CPR at time of cardiac arrest with elevated INR. Hold coumadin for now; resume at DC if hemoptysis resolves. 5 chronic anticoagulation-I am not clear as to the reason for his long-term anticoagulation. I find no documentation of atrial fibrillation or pulmonary embolus. He will review this with Dr. Mare Ferrari following discharge. 6 coronary artery disease-discontinue aspirin given need for Coumadin. Intolerant to statins. 7 prior ICD Transfer to telemetry and ambulate. Kirk Ruths 12/07/2013, 8:00 AM

## 2013-12-07 NOTE — Progress Notes (Signed)
No further SVT  ICD morphology template updated which should withhold therapy for SVT.  No other device programming changes recommended.  EP to see as needed.  Please call with questions.

## 2013-12-08 ENCOUNTER — Inpatient Hospital Stay (HOSPITAL_COMMUNITY): Payer: Medicare Other

## 2013-12-08 LAB — PROTIME-INR
INR: 1.28 (ref 0.00–1.49)
PROTHROMBIN TIME: 16 s — AB (ref 11.6–15.2)

## 2013-12-08 LAB — BASIC METABOLIC PANEL
Anion gap: 13 (ref 5–15)
BUN: 24 mg/dL — AB (ref 6–23)
CO2: 27 mEq/L (ref 19–32)
CREATININE: 1.14 mg/dL (ref 0.50–1.35)
Calcium: 8.8 mg/dL (ref 8.4–10.5)
Chloride: 97 mEq/L (ref 96–112)
GFR calc non Af Amer: 62 mL/min — ABNORMAL LOW (ref >90)
GFR, EST AFRICAN AMERICAN: 72 mL/min — AB (ref >90)
Glucose, Bld: 115 mg/dL — ABNORMAL HIGH (ref 70–99)
POTASSIUM: 4 meq/L (ref 3.7–5.3)
Sodium: 137 mEq/L (ref 137–147)

## 2013-12-08 MED ORDER — LOSARTAN POTASSIUM 50 MG PO TABS: 25.0000 mg | ORAL_TABLET | Freq: Every day | ORAL | Status: DC

## 2013-12-08 MED ORDER — TRAMADOL HCL 50 MG PO TABS: 50.0000 mg | ORAL_TABLET | Freq: Two times a day (BID) | ORAL | Status: DC | PRN

## 2013-12-08 MED ORDER — FUROSEMIDE 20 MG PO TABS: 20.0000 mg | ORAL_TABLET | Freq: Every day | ORAL | Status: DC

## 2013-12-08 MED ORDER — CARVEDILOL 6.25 MG PO TABS: 6.2500 mg | ORAL_TABLET | Freq: Two times a day (BID) | ORAL | Status: DC

## 2013-12-08 NOTE — Progress Notes (Signed)
Patient ID: [name], male   DOB: 1940/06/12, 73 y.o.   MRN: [id number]    Subjective:  Wants to go home  No complaints  Except chest hurts a bit from CPR   Objective:  Filed Vitals:   12/07/13 1127 12/07/13 1314 12/07/13 2135 12/08/13 0613  BP: 104/41 114/61 113/57 122/57  Pulse:  74 74 71  Temp: 98.1 F (36.7 C) 97.9 F (36.6 C) 98.6 F (37 C) 99 F (37.2 C)  TempSrc: Oral Oral Oral Oral  Resp: 22 20 18 18   Height:  5\' 8"  (1.727 m)    Weight:  178 lb 8 oz (80.967 kg)  178 lb 3.7 oz (80.845 kg)  SpO2: 92% 81% 92% 82%    Intake/Output from previous day:  Intake/Output Summary (Last 24 hours) at 12/08/13 0836 Last data filed at 12/08/13 4580  Gross per 24 hour  Intake    870 ml  Output    450 ml  Net    420 ml    Physical Exam: Physical exam: Well-developed well-nourished in no acute distress.  Skin is warm and dry.  HEENT is normal.  Neck is supple.  Chest CTA Cardiovascular exam is regular rate and rhythm.  Abdominal exam nontender or distended. No masses palpated. Extremities show no edema. neuro grossly intact    Lab Results: Basic Metabolic Panel:  Recent Labs  12/06/13 0411 12/07/13 0329 12/08/13 0326  NA 134* 138 137  K 4.1 4.0 4.0  CL 97 96 97  CO2 23 27 27   GLUCOSE 128* 113* 115*  BUN 31* 31* 24*  CREATININE 1.43* 1.23 1.14  CALCIUM 8.8 9.0 8.8  MG  --  2.2  --   PHOS  --  2.2*  --    CBC:  Recent Labs  12/06/13 0411 12/07/13 0329  WBC 14.8* 13.2*  HGB 13.8 13.4  HCT 40.0 39.8  MCV 91.7 94.1  PLT 178 199   Cardiac Enzymes:  Recent Labs  12/05/13 1025 12/05/13 1655 12/06/13 0410  TROPONINI <0.30 <0.30 <0.30     Assessment/Plan:  1 acute on chronic systolic congestive heart failure- Volume status better. Change Lasix to 20 mg by mouth daily. Continue carvedilol. Resume ARB Resume cozaar at lower dose 25 mg held due to low BP while in unit       Echo with EF 20-25. 2 status post cardiac arrest-Dr. Caryl Comes has  reviewed and feels this is a primary arrhythmic event related to supraventricular tachycardia. Therefore will not pursue cardiac catheterization. Continue sotalol and mexiletine. Pacer reprogrammed by Dr Rayann Heman  Continue beta blocker. We'll await further input from electrophysiology concerning device management. 3 acute on chronic renal failure-improved this AM; follow 4 hemoptysis-patient had brief hemoptysis previously and recurrence this AM. Chest CT results noted. Possibly related to CPR at time of cardiac arrest with elevated INR. Hold coumadin for now; resume at DC if hemoptysis resolves. 5 chronic anticoagulation-I am not clear as to the reason for his long-term anticoagulation. I find no documentation of atrial fibrillation or pulmonary embolus. He will review this with Dr. Mare Ferrari following discharge. 6 coronary artery disease-discontinue aspirin given need for Coumadin. Intolerant to statins. 7 prior ICD   D/C home today f/u [place] He can decide on resumption of coumadin or not  Jenkins Rouge 12/08/2013, 8:36 AM

## 2013-12-08 NOTE — Discharge Summary (Signed)
CARDIOLOGY DISCHARGE SUMMARY   Patient ID: [name] MRN: [id number] DOB/AGE: 1940/12/04 73 y.o.  Admit date: 12/05/2013 Discharge date: 12/08/2013  PCP: Sheela Stack, MD Primary Cardiologist: Dr. Mare Ferrari Primary care electrophysiologist: Dr. Caryl Comes  Primary Discharge Diagnosis:   Acute respiratory failure Secondary Discharge Diagnosis:    Cardiomyopathy, ischemic   Sinus bradycardia   Cardiac arrest   SVT (supraventricular tachycardia)   Hyperkalemia   Acute renal insufficiency   Acute on chronic systolic heart failure - discharge weight 178 pounds  Consults: Electrophysiology, CCM  Procedures: Device interrogation, 2-D echocardiogram  Hospital Course: [name] is a 73 y.o. male with a history of ischemic cardiomyopathy. He has a BiV-ICD.   He suffered an out of hospital cardiac arrest. It started by him complaining of shortness of breath. He became unresponsive and lost pulses after EMS arrival. First responders started CPR. Upon arrival by ambulance, he was responsive but was pale and diaphoretic. He was transported emergently to [facility]. He was evaluated by Dr. Tamala Julian and his ECG showed an atrial paced rhythm. His chest x-ray showed CHF. Initially a STEMI was called but because he has a paced rhythm, this was not felt to be a true STEMI and he was not taken to the Cath Lab. His cardiac arrest was felt most likely secondary to ventricular arrhythmia. He was admitted to CCU.  He was oxygenating poorly and started on BiPAP. He was seen by CCM and blood gas showed an initial ABG of pH 7.21/PCO2 44.5/PO2 55. Additional labs were abnormal with a potassium of 5.8 and a lactic acid level of 6.5. He had been on the pleura and on for a year. CCM manage the BiPAP and he was diuresed. As he was diuresed, his respiratory status improved. Intake/output was negative by over 3 L during his hospital stay. On 07/10, he was stable enough for transition to oral Lasix.  His blood pressure was borderline, so he was not on an ACE or ARB. At discharge his weight is 178 pounds.  His initial POCtroponin was 0.09 and regular troponin was 0.32, but subsequent troponins were negative despite undergoing CPR. He was felt not to have had a primary ischemic event. A 2-D echocardiogram was performed this admission, results below. It showed an EF of 20-25%, consistent with previous values. He is on a low dose of a beta blocker but because he had some hemoptysis, is not currently on aspirin. He has been intolerant to statins in the past, but is tolerating Crestor 2 times a week. He is not on ACE or ARB because of abnormal renal function on admission and hypotension.  An electrophysiology consult was called to evaluate device function and possible causes for his cardiac arrest. He was seen by Dr. Caryl Comes who reviewed the data from his device. It appeared to be a primary arrhythmic issue with the onset of slow SVT. The device tried to terminated, but this gave rise to short runs of self-terminating ventricular tachycardia. The SVT was felt to have caused the respiratory failure. It was recommended that he be watched carefully but if it recurs, ablation would have to be considered. It was also noted that his creatinine was higher than previously which would influence his sotalol dose. The device was reprogrammed so that it would not treat SVT as VT. He was continued on sotalol and mexiletine.  He had been on Coumadin prior to admission and his INR on admission was therapeutic at 2.12. He had hemoptysis  but this was felt secondary to the CPR. His Coumadin was held and his discharge INR is below. He is to followup with Dr. Mare Ferrari and decide on resumption of Coumadin as an outpatient.  A CT was performed which showed a small mass in his neck. He had a single mildly enlarged right superior paratracheal lymph node measuring 11 mm in short axis, most likely reactive. A 2.6 cm lobulated soft  tissue mass at the neck base anteriorly adjacent to the jugular notch is nonspecific.  His TSH was normal. This can be followed as an outpatient.  On 07/11, [name] was seen by Dr. Johnsie Cancel and all data were reviewed. He was felt stable to continue on a reduced dose of carvedilol and could resume Cozaar at a decreased dose as well. He is to continue sotalol and mexiletine. Dr. Rayann Heman had changed the device settings to avoid therapy for SVT. His renal function had improved once the Lasix was changed to an oral formulation. He was still having some chest pain related to the CPR, but is otherwise doing well. [name] further inpatient workup is indicated and he is considered stable for discharge, to follow up as an outpatient.  Labs: ABG    Component Value Date/Time   PHART 7.364 12/05/2013 0806   PCO2ART 36.6 12/05/2013 0806   PO2ART 151.0* 12/05/2013 0806   HCO3 20.9 12/05/2013 0806   TCO2 22 12/05/2013 0806   ACIDBASEDEF 4.0* 12/05/2013 0806   O2SAT 99.0 12/05/2013 0806   Lab Results  Component Value Date   WBC 13.2* 12/07/2013   HGB 13.4 12/07/2013   HCT 39.8 12/07/2013   MCV 94.1 12/07/2013   PLT 199 12/07/2013     Recent Labs Lab 12/08/13 0326  NA 137  K 4.0  CL 97  CO2 27  BUN 24*  CREATININE 1.14  CALCIUM 8.8  GLUCOSE 115*    Recent Labs  12/05/13 1025 12/05/13 1655 12/06/13 0410  TROPONINI <0.30 <0.30 <0.30   Pro B Natriuretic peptide (BNP)  Date/Time Value Ref Range Status  12/05/2013 12:47 AM 2438.0* 0 - 125 pg/mL Final  10/25/2012  8:45 AM 256.0* 0.0 - 100.0 pg/mL Final    Recent Labs  12/08/13 0326  INR 1.28      Radiology: Ct Chest Wo Contrast 12/06/2013   CLINICAL DATA:  Hemoptysis.  EXAM: CT CHEST WITHOUT CONTRAST  TECHNIQUE: Multidetector CT imaging of the chest was performed following the standard protocol without IV contrast.  COMPARISON:  Chest radiograph, 12/05/2013.  FINDINGS: Lungs show diffuse hazy ground-glass opacity in both upper lobes, portions of the right  middle lobe and in both lower lobes. There are areas of coarse reticular type opacity and architectural distortion within this mostly in the anterior upper lobes. Small areas of more confluent opacity are noted in the lower lobes which may reflect superimposed atelectasis. Lungs show changes of centrilobular and paraseptal emphysema most evident in the upper lobes.  [name] pleural effusion.  [name] pneumothorax.  Heart is mildly enlarged. There are dense coronary artery calcifications. 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 Great vessels are normal in caliber. There is a single mildly enlarged superior right peritracheal lymph node measuring 11 mm in short axis. [name] other adenopathy. [name] hilar masses or adenopathy.  There is a lobulated soft tissue mass adjacent to the jugular notch at the anterior neck base measuring 2.6 cm x 2.5 cm x 2.6 cm. This is nonspecific. It could reflect thyroid tissue.  Limited evaluation of the upper abdomen shows low-density renal lesions, likely cysts, some dependent material in the gallbladder fundus which may reflect viscus bowel or small layering stones, and mild nodularity of the left adrenal gland. These findings without significant change from the prior CT dated 03/13/2013.  Left anterior chest wall pacemaker is well positioned.  There are minor degenerative changes of the visualized spine. [name] osteoblastic or osteolytic lesions.  IMPRESSION: 1. Diffuse bilateral ground-glass airspace opacities superimposed on areas of lung scarring and changes of emphysema. Findings could be due to pulmonary edema. A diffuse alveolar inflammatory or infectious infiltrate could have this appearance. 2. Single mildly enlarged right superior paratracheal lymph node measuring 11 mm in short axis, most likely reactive. 3. 2.6 cm lobulated soft tissue mass at the neck base anteriorly adjacent to the jugular notch. This is nonspecific. 4. Other chronic findings as  detailed above.   Electronically Signed   By: Lajean Manes M.D.   On: 12/06/2013 20:40   Dg Chest Port 1 View 12/07/2013   CLINICAL DATA:  Assess edema  EXAM: PORTABLE CHEST - 1 VIEW  COMPARISON:  12/05/2013  FINDINGS: The cardiac shadow is stable. A defibrillator is again noted. Lungs are well aerated bilaterally. Significant improvement in the degree of airspace disease is noted. Only minimal residual changes are noted on the right. [name] new focal infiltrate is seen.  IMPRESSION: Significantly improved airspace disease bilateral with only mild residual changes on the right.   Electronically Signed   By: Inez Catalina M.D.   On: 12/07/2013 07:12   Dg Chest Port 1 View 12/05/2013   CLINICAL DATA:  Shortness of breath.  Cough.  EXAM: PORTABLE CHEST - 1 VIEW  COMPARISON:  12/05/2013.  FINDINGS: Cardiac pacer and pacing wires are in stable position. A pacing wire is noted coursing into the right subclavian vein and looping back into the superior vena cava and right atrial region. Cardiomegaly. Diffuse bilateral pulmonary alveolar infiltrates noted most consistent with pulmonary edema. Pneumonia cannot be excluded. [name] pleural effusion or pneumothorax. [name] acute osseus abnormality.  IMPRESSION: Persistent changes of congestive heart failure with pulmonary edema. Other etiologies of pulmonary infiltrates including pneumonia cannot be excluded. Chest x-ray is unchanged from prior exam.   Electronically Signed   By: [place]   On: 12/05/2013 07:58   Dg Chest Portable 1 View 12/05/2013   CLINICAL DATA:  Shortness of breath.  Post CPR.  EXAM: PORTABLE CHEST - 1 VIEW  COMPARISON:  04/07/2011  FINDINGS: Cardiac pacemaker. Cardiac enlargement with mild pulmonary vascular congestion. Diffuse airspace disease in the lungs likely represents edema or pneumonia. [name] blunting of costophrenic angles. [name] pneumothorax.  IMPRESSION: Cardiac enlargement with pulmonary vascular congestion and bilateral airspace disease, likely  edema.   Electronically Signed   By: Lucienne Capers M.D.   On: 12/05/2013 01:15   EKG: 12/06/2013 Atrioventricular pacing, rate 70  Echo: 12/06/2013 Conclusions - Left ventricle: The cavity size was mildly dilated. Wall thickness was increased in a pattern of mild LVH. Systolic function was severely reduced. The estimated ejection fraction was in the range of 20% to 25%. There is akinesis of the anteroseptal myocardium. There is aneurysmal deformity of the apical myocardium. Features are consistent with a pseudonormal left ventricular filling pattern, with concomitant abnormal relaxation and increased filling pressure (grade 2 diastolic dysfunction). - Aortic valve: There was mild regurgitation. - Left atrium: The atrium was mildly dilated. - Right atrium: The atrium was mildly dilated. Impressions: - Anteroseptal  akinesis; apical aneurysm; overall severely reduced LV function; [name] apical thrombus using contrast; mild biatrial enlargement; mild AI.   FOLLOW UP PLANS AND APPOINTMENTS Allergies  Allergen Reactions  . Iohexol      Code: HIVES, Desc: hives w/ itching during cardiac cath '99, mult caths since w/ ? premeds, DR.G.HAYES REQUESTS 13 HR PRE MED//A.C.pt okay w/13 hr prep/mms, Onset Date: [id number]   . Prednisone Other (See Comments)    Hives     Medication List    STOP taking these medications       aspirin 81 MG tablet     eplerenone 25 MG tablet  Commonly known as:  INSPRA     warfarin 2.5 MG tablet  Commonly known as:  COUMADIN      TAKE these medications       ALPRAZolam 0.25 MG tablet  Commonly known as:  XANAX  Take 1 tablet (0.25 mg total) by mouth 4 (four) times daily as needed. For anxiety     carvedilol 6.25 MG tablet  Commonly known as:  COREG  Take 1 tablet (6.25 mg total) by mouth 2 (two) times daily with a meal. Take one tablet by mouth twice daily     CLARITIN 10 MG Caps  Generic drug:  Loratadine  Take 1 capsule by mouth daily.      Fish Oil 1000 MG Caps  Take 1,000 mg by mouth 2 (two) times daily.     furosemide 20 MG tablet  Commonly known as:  LASIX  Take 1 tablet (20 mg total) by mouth daily. Prn weight gain of 3 pounds overnight     levothyroxine 88 MCG tablet  Commonly known as:  SYNTHROID, LEVOTHROID  Take 88-132 mcg by mouth daily. Take 1 and 1/2 pill on Thursday     losartan 50 MG tablet  Commonly known as:  COZAAR  Take 0.5 tablets (25 mg total) by mouth daily.     meclizine 25 MG tablet  Commonly known as:  ANTIVERT  Take 25 mg by mouth 3 (three) times daily as needed for dizziness.     mexiletine 150 MG capsule  Commonly known as:  MEXITIL  Take 150 mg by mouth 3 (three) times daily.     multivitamin with minerals Tabs tablet  Take 1 tablet by mouth daily.     rosuvastatin 20 MG tablet  Commonly known as:  CRESTOR  Take 20 mg by mouth 2 (two) times a week. On Tuesday and Saturdays     sotalol 80 MG tablet  Commonly known as:  BETAPACE  Take 80 mg by mouth 2 (two) times daily.     traMADol 50 MG tablet  Commonly known as:  ULTRAM  Take 1 tablet (50 mg total) by mouth every 12 (twelve) hours as needed for moderate pain.     vitamin C 500 MG tablet  Commonly known as:  ASCORBIC ACID  Take 500 mg by mouth daily.        Discharge Instructions   (HEART FAILURE PATIENTS) Call MD:  Anytime you have any of the following symptoms: 1) 3 pound weight gain in 24 hours or 5 pounds in 1 week 2) shortness of breath, with or without a dry hacking cough 3) swelling in the hands, feet or stomach 4) if you have to sleep on extra pillows at night in order to breathe.    Complete by:  As directed      Diet - low sodium heart healthy  Complete by:  As directed      Increase activity slowly    Complete by:  As directed           Follow-up Information   Follow up with Darlin Coco, MD. (The office will call)    Specialty:  Cardiology   Contact information:   [address] [address] [phone]       BRING ALL MEDICATIONS WITH YOU TO FOLLOW UP APPOINTMENTS  Time spent with patient to include physician time: 41 min Signed: Rosaria Ferries, PA-C 12/08/2013, 10:01 AM Co-Sign MD

## 2013-12-11 ENCOUNTER — Ambulatory Visit (INDEPENDENT_AMBULATORY_CARE_PROVIDER_SITE_OTHER): Payer: Medicare Other | Admitting: Cardiology

## 2013-12-11 ENCOUNTER — Encounter: Payer: Self-pay | Admitting: Cardiology

## 2013-12-11 VITALS — BP 118/75 | HR 74 | Ht 68.0 in | Wt 176.0 lb

## 2013-12-11 DIAGNOSIS — I255 Ischemic cardiomyopathy: Secondary | ICD-10-CM

## 2013-12-11 DIAGNOSIS — I2510 Atherosclerotic heart disease of native coronary artery without angina pectoris: Secondary | ICD-10-CM

## 2013-12-11 DIAGNOSIS — R0602 Shortness of breath: Secondary | ICD-10-CM

## 2013-12-11 DIAGNOSIS — Z79899 Other long term (current) drug therapy: Secondary | ICD-10-CM

## 2013-12-11 DIAGNOSIS — I472 Ventricular tachycardia, unspecified: Secondary | ICD-10-CM

## 2013-12-11 DIAGNOSIS — I4729 Other ventricular tachycardia: Secondary | ICD-10-CM

## 2013-12-11 DIAGNOSIS — Z7901 Long term (current) use of anticoagulants: Secondary | ICD-10-CM

## 2013-12-11 DIAGNOSIS — E875 Hyperkalemia: Secondary | ICD-10-CM

## 2013-12-11 DIAGNOSIS — I2589 Other forms of chronic ischemic heart disease: Secondary | ICD-10-CM

## 2013-12-11 NOTE — Assessment & Plan Note (Signed)
The patient had previously been taking furosemide only if his weight went up quickly in a short period of time.  Now we are going to have him take his Lasix 20 mg every day.  We are going to check a baseline basal metabolic panel today.  We will also check next week when he returns for his prothrombin time.

## 2013-12-11 NOTE — Progress Notes (Signed)
[name] Date of Birth:  07/17/1940 [place] [address] [place], [place]  [address] [phone]        Fax   [phone]   History of Present Illness: This pleasant 73 year old gentleman is seen for a  Post hospital office visit.  He was admitted on July 8 and discharged on July 11.  He presented with acute pulmonary edema.  He required brief CPR by EMS at his home.  His wife describes frothy sputum at home which came on very quickly. He has a history of a remote anterior wall myocardial infarction. He has an ischemic cardiomyopathy.  He has a left ventricular apical aneurysm. He has had a defibrillator for ventricular tachycardia. He has been followed closely by Dr. Caryl Comes and Dr. Rayann Heman. 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 Subsequently he had recurrent ventricular tachycardia and was transferred to [facility] for incessant VT and underwent a second ablation procedure complicated by pericardial perforation and pericarditis. He underwent a third catheter VT ablation procedure in March 2013 by Dr. Rayann Heman and since then has felt well. He has not been aware of any palpitations. He has not been aware of any shocks from his ICD. He does have a known ischemic cardiomyopathy with ejection fraction of 25-30% by echocardiogram in February 2012. In autumn 2014 he had some problems with fluid overload [place] to increased diuretic and to dietary salt restriction.  At the time of this present admission his echocardiogram on 12/06/13 showed an ejection fraction of 20-25% and there was an apical aneurysm.   Current Outpatient Prescriptions  Medication Sig Dispense Refill  . ALPRAZolam (XANAX) 0.25 MG tablet Take 1 tablet (0.25 mg total) by mouth 4 (four) times daily as needed. For anxiety  360 tablet  1  . Ascorbic Acid (VITAMIN C) 500 MG tablet Take 500 mg by mouth daily.        .  carvedilol (COREG) 6.25 MG tablet Take 1 tablet (6.25 mg total) by mouth 2 (two) times daily with a meal. Take one tablet by mouth twice daily  60 tablet  3  . furosemide (LASIX) 20 MG tablet Take 20 mg by mouth daily.      [name] levothyroxine (SYNTHROID, LEVOTHROID) 88 MCG tablet Take 88-132 mcg by mouth daily. Take 1 and 1/2 pill on Thursday      . Loratadine (CLARITIN) 10 MG CAPS Take 1 capsule by mouth daily.       [name] losartan (COZAAR) 50 MG tablet Take 0.5 tablets (25 mg total) by mouth daily.  90 tablet  3  . meclizine (ANTIVERT) 25 MG tablet Take 25 mg by mouth 3 (three) times daily as needed for dizziness.      . mexiletine (MEXITIL) 150 MG capsule Take 150 mg by mouth 3 (three) times daily.      . Multiple Vitamin (MULITIVITAMIN WITH MINERALS) TABS Take 1 tablet by mouth daily.      . Omega-3 Fatty Acids (FISH OIL) 1000 MG CAPS Take 1,000 mg by mouth 2 (two) times daily.       . rosuvastatin (CRESTOR) 20 MG tablet Take 20 mg by mouth 2 (two) times a week. On Tuesday and Saturdays      . sotalol (BETAPACE) 80 MG tablet Take 80 mg by mouth 2 (two) times daily.      . traMADol (ULTRAM) 50 MG tablet Take  1 tablet (50 mg total) by mouth every 12 (twelve) hours as needed for moderate pain.  20 tablet  0  . warfarin (COUMADIN) 2.5 MG tablet Take 2.5 mg by mouth daily. As directed by coumadin clinic       No current facility-administered medications for this visit.    Allergies  Allergen Reactions  . Iohexol      Code: HIVES, Desc: hives w/ itching during cardiac cath '99, mult caths since w/ ? premeds, DR.G.HAYES REQUESTS 13 HR PRE MED//A.C.pt okay w/13 hr prep/mms, Onset Date: [id number]   . Prednisone Other (See Comments)    Hives    Patient Active Problem List   Diagnosis Date Noted  . Cardiac arrest 12/05/2013  . SVT (supraventricular tachycardia) 12/05/2013  . Acute respiratory failure 12/05/2013  . Hyperkalemia 12/05/2013  . Acute renal insufficiency 12/05/2013  . Acute on chronic  systolic heart failure 27/78/2423  . Encounter for therapeutic drug monitoring 10/10/2013  . Dyslipidemia 02/15/2013  . Coronary artery disease 04/08/2011  . Anxiety 04/08/2011  . HTN (hypertension) 02/02/2011  . Biventricular implantable cardioverter-defibrillator in situ 11/03/2010  . Sinus bradycardia 11/03/2010  . Chronic anticoagulation 10/05/2010  . Cardiomyopathy, ischemic   . Chronic systolic heart failure   . Ventricular tachycardia   . LBBB 05/21/2010  . 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 ABDOMINAL AORTIC ANEURYSM 02/07/2009    History  Smoking status  . Former Smoker  Smokeless tobacco  . Former Systems developer  . Quit date: 06/01/1987    History  Alcohol Use  . Yes    Comment: very rare    Family History  Problem Relation Age of Onset  . Stroke Mother   . Heart attack Father   . Heart attack Mother     Review of Systems: Constitutional: no fever chills diaphoresis or fatigue or change in weight.  Head and neck: no hearing loss, no epistaxis, no photophobia or visual disturbance. Respiratory: No cough, shortness of breath or wheezing. Cardiovascular: No chest pain peripheral edema, palpitations. Gastrointestinal: No abdominal distention, no abdominal pain, no change in bowel habits hematochezia or melena. Genitourinary: No dysuria, no frequency, no urgency, no nocturia. Musculoskeletal:No arthralgias, no back pain, no gait disturbance or myalgias. Neurological: No dizziness, no headaches, no numbness, no seizures, no syncope, no weakness, no tremors. Hematologic: No lymphadenopathy, no easy bruising. Psychiatric: No confusion, no hallucinations, no sleep disturbance.    Physical Exam: Filed Vitals:   12/11/13 1600  BP: 118/75  Pulse: 74   the general appearance reveals a well-developed well-nourished elderly gentleman in no distress.The head and neck exam reveals pupils equal and reactive.  Extraocular movements are full.  There is no scleral icterus.  The mouth and  pharynx are normal.  The neck is supple.  The carotids reveal no bruits.  The jugular venous pressure is normal.  The  thyroid is not enlarged.  There is no lymphadenopathy.  The chest is clear to percussion and auscultation.  There are no rales or rhonchi.  Expansion of the chest is symmetrical.  The precordium is quiet.  The anterior chest wall is still tender from his CPR.  The first heart sound is normal.  The second heart sound is physiologically split.  There is no murmur gallop rub or click.  There is no abnormal lift or heave.  The abdomen is soft and nontender.  The bowel sounds are normal.  The liver and spleen are not enlarged.  There are no abdominal masses.  There are no abdominal  bruits.  Extremities reveal good pedal pulses.  There is no phlebitis or edema.  There is no cyanosis or clubbing.  Strength is normal and symmetrical in all extremities.  There is no lateralizing weakness.  There are no sensory deficits.  The skin is warm and dry.  There is no rash.     Assessment / Plan: 1. ischemic heart disease with ischemic cardiomyopathy.  Ejection fraction 20-25% with apical aneurysm by echocardiogram 12/06/13 2. 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 3. past history of ventricular tachycardia.  He has had 3 separate VT ablation procedures was recently in March 2013 by Dr. Rayann Heman 4. hypothyroidism followed by Dr. Forde Dandy 5. hypercholesterolemia  Plan: Remain off aspirin.  Restart warfarin. Weigh every day and keep a written record.  Take Lasix 20 mg daily.  His diet at home is quite careful in regard to salt but when he goes out to be in restaurants or with friends he may be getting more salt than he can handle.  He will know that he can take an extra Lasix when necessary. Recheck in one month for followup office visit and basal metabolic panel.  He will return next week for lab  work including INR.

## 2013-12-11 NOTE — Assessment & Plan Note (Signed)
His defibrillator was adjusted while he was in the hospital recently to avoid the defibrillator mistaking sinus tachycardia for ventricular tachycardia.

## 2013-12-11 NOTE — Assessment & Plan Note (Signed)
The patient was hyperkalemic on arrival at the emergency room.  His aldosterone antagonist was stopped.  We are rechecking his basal metabolic panel today.  His electrolytes at discharge from the hospital were satisfactory.

## 2013-12-11 NOTE — Assessment & Plan Note (Signed)
We will resume his chronic warfarin therapy.  He will no longer be taking a baby aspirin.

## 2013-12-11 NOTE — Patient Instructions (Addendum)
Will obtain labs today and call you with the results (bmet)  RESTART YOUR WARFARIN AT 2.5 MG DAILY EXCEPT 3.75 MG ON Friday (DOSE YOU WERE PREVIOUSLY TAKING)  Take your Lasix 20 mg every day  Your physician recommends that you schedule a follow-up appointment in: 1 month ov/bmet   BMET NEXT WEEK WITH YOUR COUMADIN CHECK

## 2013-12-12 LAB — BASIC METABOLIC PANEL
BUN: 19 mg/dL (ref 6–23)
CHLORIDE: 100 meq/L (ref 96–112)
CO2: 27 meq/L (ref 19–32)
Calcium: 9.4 mg/dL (ref 8.4–10.5)
Creatinine, Ser: 1.3 mg/dL (ref 0.4–1.5)
GFR: 59.22 mL/min — ABNORMAL LOW (ref >60.00)
GLUCOSE: 100 mg/dL — AB (ref 70–99)
POTASSIUM: 4.6 meq/L (ref 3.5–5.1)
SODIUM: 136 meq/L (ref 135–145)

## 2013-12-13 ENCOUNTER — Telehealth: Payer: Self-pay | Admitting: Cardiology

## 2013-12-13 ENCOUNTER — Encounter: Payer: Self-pay | Admitting: *Deleted

## 2013-12-13 ENCOUNTER — Ambulatory Visit (INDEPENDENT_AMBULATORY_CARE_PROVIDER_SITE_OTHER): Payer: Medicare Other | Admitting: *Deleted

## 2013-12-13 DIAGNOSIS — I5022 Chronic systolic (congestive) heart failure: Secondary | ICD-10-CM

## 2013-12-13 DIAGNOSIS — Z95810 Presence of automatic (implantable) cardiac defibrillator: Secondary | ICD-10-CM

## 2013-12-13 NOTE — Telephone Encounter (Signed)
Calling to speak with Nira Conn about ICM clinic.

## 2013-12-13 NOTE — Telephone Encounter (Signed)
I spoke with the patient and his wife.

## 2013-12-13 NOTE — Progress Notes (Signed)
EPIC Encounter for ICM Monitoring  Patient Name: [name] is a 73 y.o. male Date: 12/13/2013 Primary Care Physican: Sheela Stack, MD Primary Cardiologist: Mare Ferrari Electrophysiologist: Faustino Congress Weight: 176 lbs       In the past month, have you:  1. Gained more than 2 pounds in a day or more than 5 pounds in a week? no  2. Had changes in your medications (with verification of current medications)? Yes. The patient saw Dr. Mare Ferrari on 12/11/13 as a post hospital follow up for pulmonary edema (7/8-7/11). Dr. Mare Ferrari started lasix 20 mg once daily.  3. Had more shortness of breath than is usual for you? no  4. Limited your activity because of shortness of breath? no  5. Not been able to sleep because of shortness of breath? no  6. Had increased swelling in your feet or ankles? no  7. Had symptoms of dehydration (dizziness, dry mouth, increased thirst, decreased urine output) no  8. Had changes in sodium restriction? no  9. Been compliant with medication? Yes   ICM trend:   Follow-up plan: ICM clinic phone appointment: 12/20/13. The patient's corvue reading is slightly below baseline, but does appear to be trending toward normal. The patient's weight is down 1 lb from yesterday. He is otherwise asymptomatic. No changes will be made today. The patient will send a manual transmission on 12/20/13 as a one week follow up. We will continue to check him weekly x 4 readings.   Copy of note sent to patient's primary care physician, primary cardiologist, and device following physician.  Alvis Lemmings, RN, BSN 12/13/2013 11:09 AM

## 2013-12-14 NOTE — Progress Notes (Signed)
Quick Note:  Patient notified of lab results and Dr. Sherryl Barters advisement to continue current medications. Patient verbalized understanding and agreement with current treatment plan. Requested hard copy be sent to his home address. This has been completed. ______

## 2013-12-18 ENCOUNTER — Other Ambulatory Visit: Payer: Self-pay

## 2013-12-18 ENCOUNTER — Ambulatory Visit (INDEPENDENT_AMBULATORY_CARE_PROVIDER_SITE_OTHER): Payer: Medicare Other

## 2013-12-18 ENCOUNTER — Ambulatory Visit (INDEPENDENT_AMBULATORY_CARE_PROVIDER_SITE_OTHER): Payer: Medicare Other | Admitting: *Deleted

## 2013-12-18 ENCOUNTER — Telehealth: Payer: Self-pay

## 2013-12-18 DIAGNOSIS — I10 Essential (primary) hypertension: Secondary | ICD-10-CM

## 2013-12-18 DIAGNOSIS — I255 Ischemic cardiomyopathy: Secondary | ICD-10-CM

## 2013-12-18 DIAGNOSIS — E119 Type 2 diabetes mellitus without complications: Secondary | ICD-10-CM

## 2013-12-18 DIAGNOSIS — E785 Hyperlipidemia, unspecified: Secondary | ICD-10-CM

## 2013-12-18 DIAGNOSIS — Z7901 Long term (current) use of anticoagulants: Secondary | ICD-10-CM

## 2013-12-18 DIAGNOSIS — E039 Hypothyroidism, unspecified: Secondary | ICD-10-CM

## 2013-12-18 DIAGNOSIS — I2589 Other forms of chronic ischemic heart disease: Secondary | ICD-10-CM

## 2013-12-18 DIAGNOSIS — Z5181 Encounter for therapeutic drug level monitoring: Secondary | ICD-10-CM

## 2013-12-18 LAB — CBC WITH DIFFERENTIAL/PLATELET
Basophils Absolute: 0 10*3/uL (ref 0.0–0.1)
Basophils Relative: 0.6 % (ref 0.0–3.0)
EOS ABS: 0.1 10*3/uL (ref 0.0–0.7)
Eosinophils Relative: 1.7 % (ref 0.0–5.0)
HEMATOCRIT: 37 % — AB (ref 39.0–52.0)
Hemoglobin: 12.2 g/dL — ABNORMAL LOW (ref 13.0–17.0)
Lymphocytes Relative: 27.8 % (ref 12.0–46.0)
Lymphs Abs: 1.9 10*3/uL (ref 0.7–4.0)
MCHC: 32.9 g/dL (ref 30.0–36.0)
MCV: 96 fl (ref 78.0–100.0)
MONO ABS: 1.2 10*3/uL — AB (ref 0.1–1.0)
Monocytes Relative: 17.7 % — ABNORMAL HIGH (ref 3.0–12.0)
NEUTROS PCT: 52.2 % (ref 43.0–77.0)
Neutro Abs: 3.6 10*3/uL (ref 1.4–7.7)
PLATELETS: 434 10*3/uL — AB (ref 150.0–400.0)
RBC: 3.86 Mil/uL — ABNORMAL LOW (ref 4.22–5.81)
RDW: 13.2 % (ref 11.5–15.5)
WBC: 6.8 10*3/uL (ref 4.0–10.5)

## 2013-12-18 LAB — LIPID PANEL
Cholesterol: 129 mg/dL (ref 0–200)
HDL: 31.2 mg/dL — ABNORMAL LOW (ref >39.00)
LDL CALC: 76 mg/dL (ref 0–99)
NonHDL: 97.8
TRIGLYCERIDES: 111 mg/dL (ref 0.0–149.0)
Total CHOL/HDL Ratio: 4
VLDL: 22.2 mg/dL (ref 0.0–40.0)

## 2013-12-18 LAB — URINALYSIS
Bilirubin Urine: NEGATIVE
Hgb urine dipstick: NEGATIVE
Ketones, ur: NEGATIVE
LEUKOCYTES UA: NEGATIVE
Nitrite: NEGATIVE
Specific Gravity, Urine: <=1.005 — AB (ref 1.000–1.030)
Total Protein, Urine: NEGATIVE
URINE GLUCOSE: NEGATIVE
UROBILINOGEN UA: 0.2 (ref 0.0–1.0)
pH: 6.5 (ref 5.0–8.0)

## 2013-12-18 LAB — BASIC METABOLIC PANEL
BUN: 14 mg/dL (ref 6–23)
CHLORIDE: 100 meq/L (ref 96–112)
CO2: 30 meq/L (ref 19–32)
CREATININE: 1.4 mg/dL (ref 0.4–1.5)
Calcium: 9.1 mg/dL (ref 8.4–10.5)
GFR: 55.19 mL/min — ABNORMAL LOW (ref >60.00)
GLUCOSE: 87 mg/dL (ref 70–99)
POTASSIUM: 4.1 meq/L (ref 3.5–5.1)
Sodium: 135 mEq/L (ref 135–145)

## 2013-12-18 LAB — HEPATIC FUNCTION PANEL
ALT: 14 U/L (ref 0–53)
AST: 15 U/L (ref 0–37)
Albumin: 3.2 g/dL — ABNORMAL LOW (ref 3.5–5.2)
Alkaline Phosphatase: 61 U/L (ref 39–117)
BILIRUBIN DIRECT: 0 mg/dL (ref 0.0–0.3)
BILIRUBIN TOTAL: 0.7 mg/dL (ref 0.2–1.2)
Total Protein: 6.7 g/dL (ref 6.0–8.3)

## 2013-12-18 LAB — TSH: TSH: 0.85 u[IU]/mL (ref 0.35–4.50)

## 2013-12-18 LAB — HEMOGLOBIN A1C: HEMOGLOBIN A1C: 6 % (ref 4.6–6.5)

## 2013-12-18 LAB — POCT INR: INR: 1.7

## 2013-12-18 LAB — T4, FREE: Free T4: 1.42 ng/dL (ref 0.60–1.60)

## 2013-12-18 MED ORDER — TRAMADOL HCL 50 MG PO TABS: 50.0000 mg | ORAL_TABLET | Freq: Two times a day (BID) | ORAL | Status: DC | PRN

## 2013-12-18 NOTE — Telephone Encounter (Signed)
Okay to refill the tramadol one more time.

## 2013-12-19 NOTE — Telephone Encounter (Signed)
Called Tramadol to [facility] as requested

## 2013-12-20 ENCOUNTER — Other Ambulatory Visit: Payer: Self-pay | Admitting: *Deleted

## 2013-12-20 ENCOUNTER — Ambulatory Visit (INDEPENDENT_AMBULATORY_CARE_PROVIDER_SITE_OTHER): Payer: Medicare Other | Admitting: *Deleted

## 2013-12-20 DIAGNOSIS — I83893 Varicose veins of bilateral lower extremities with other complications: Secondary | ICD-10-CM

## 2013-12-20 DIAGNOSIS — Z95810 Presence of automatic (implantable) cardiac defibrillator: Secondary | ICD-10-CM

## 2013-12-20 DIAGNOSIS — I5022 Chronic systolic (congestive) heart failure: Secondary | ICD-10-CM

## 2013-12-20 NOTE — Progress Notes (Signed)
EPIC Encounter for ICM Monitoring  Patient Name: [name] is a 73 y.o. male Date: 12/20/2013 Primary Care Physican: Sheela Stack, MD Primary Cardiologist: Mare Ferrari Electrophysiologist: Caryl Comes Dry Weight: 176 lb  Bi-V Pacing: 98%       In the past month, have you:  1. Gained more than 2 pounds in a day or more than 5 pounds in a week? No. Weights have gone down over the last week. Today he is 173.5 lbs.  2. Had changes in your medications (with verification of current medications)? no  3. Had more shortness of breath than is usual for you? no  4. Limited your activity because of shortness of breath? no  5. Not been able to sleep because of shortness of breath? no  6. Had increased swelling in your feet or ankles? no  7. Had symptoms of dehydration (dizziness, dry mouth, increased thirst, decreased urine output) no  8. Had changes in sodium restriction? no  9. Been compliant with medication? Yes   ICM trend:   Follow-up plan: ICM clinic phone appointment: 12/27/13 (will recheck ICM weekly x 2 more weeks per Dr. Olin Pia initial instruction). I reviewed the patient's slightly elevated Corvue reading and weights with Dr. Mare Ferrari. No changes per Dr. Mare Ferrari. The patient did have a repeat BMP on 12/18/13- K+- 4.1/ BUN- 14/ creatinine- 1.4.  Copy of note sent to patient's primary care physician, primary cardiologist, and device following physician.  Alvis Lemmings, RN, BSN 12/20/2013 11:19 AM

## 2013-12-26 ENCOUNTER — Telehealth: Payer: Self-pay | Admitting: *Deleted

## 2013-12-26 MED ORDER — ROSUVASTATIN CALCIUM 5 MG PO TABS: ORAL_TABLET | ORAL | Status: DC

## 2013-12-26 NOTE — Telephone Encounter (Signed)
Spoke with patient regarding Crestor refill request. Patient has been taking Crestor 5 mg twice a week for over a year. Advised to continue current dose and sent Rx to [facility] as requested

## 2013-12-27 ENCOUNTER — Ambulatory Visit (INDEPENDENT_AMBULATORY_CARE_PROVIDER_SITE_OTHER): Payer: Medicare Other | Admitting: *Deleted

## 2013-12-27 ENCOUNTER — Telehealth: Payer: Self-pay | Admitting: Internal Medicine

## 2013-12-27 DIAGNOSIS — I5022 Chronic systolic (congestive) heart failure: Secondary | ICD-10-CM

## 2013-12-27 DIAGNOSIS — Z95810 Presence of automatic (implantable) cardiac defibrillator: Secondary | ICD-10-CM

## 2013-12-27 NOTE — Telephone Encounter (Signed)
Agree with plan 

## 2013-12-27 NOTE — Telephone Encounter (Signed)
New message      Talk to [facility]

## 2013-12-27 NOTE — Telephone Encounter (Signed)
I spoke with the patient. 

## 2013-12-27 NOTE — Progress Notes (Signed)
EPIC Encounter for ICM Monitoring  Patient Name: [name] is a 73 y.o. male Date: 12/27/2013 Primary Care Physican: Sheela Stack, MD Primary Cardiologist: Mare Ferrari Electrophysiologist: Caryl Comes Dry Weight: 173 lbs   Bi-V pacing: 98%      In the past month, have you:  1. Gained more than 2 pounds in a day or more than 5 pounds in a week? No. The patient's weight today is 170.2 lbs. He states he saw Dr. Forde [name] yesterday and his lungs were sounding clear as well. Corvue has improved and is back to baseline.   2. Had changes in your medications (with verification of current medications)? no  3. Had more shortness of breath than is usual for you? no  4. Limited your activity because of shortness of breath? no  5. Not been able to sleep because of shortness of breath? no  6. Had increased swelling in your feet or ankles? no  7. Had symptoms of dehydration (dizziness, dry mouth, increased thirst, decreased urine output) no  8. Had changes in sodium restriction? no  9. Been compliant with medication? Yes   ICM trend:   Follow-up plan: ICM clinic phone appointment: 01/03/14 (1 week per initial MD order). No changes today. The patient is doing well. We will look at his corvue reading next week and if this remains ok, I will start to follow him monthly. He is agreeable.  Copy of note sent to patient's primary care physician, primary cardiologist, and device following physician.  Alvis Lemmings, RN, BSN 12/27/2013 9:20 AM

## 2014-01-03 ENCOUNTER — Telehealth: Payer: Self-pay | Admitting: *Deleted

## 2014-01-03 ENCOUNTER — Ambulatory Visit (INDEPENDENT_AMBULATORY_CARE_PROVIDER_SITE_OTHER): Payer: Medicare Other | Admitting: *Deleted

## 2014-01-03 ENCOUNTER — Encounter: Payer: Self-pay | Admitting: *Deleted

## 2014-01-03 DIAGNOSIS — Z95810 Presence of automatic (implantable) cardiac defibrillator: Secondary | ICD-10-CM

## 2014-01-03 DIAGNOSIS — Z5181 Encounter for therapeutic drug level monitoring: Secondary | ICD-10-CM

## 2014-01-03 DIAGNOSIS — I255 Ischemic cardiomyopathy: Secondary | ICD-10-CM

## 2014-01-03 DIAGNOSIS — Z7901 Long term (current) use of anticoagulants: Secondary | ICD-10-CM

## 2014-01-03 DIAGNOSIS — I2589 Other forms of chronic ischemic heart disease: Secondary | ICD-10-CM

## 2014-01-03 DIAGNOSIS — I5022 Chronic systolic (congestive) heart failure: Secondary | ICD-10-CM

## 2014-01-03 LAB — POCT INR: INR: 2.4

## 2014-01-03 NOTE — Telephone Encounter (Signed)
I spoke with the patient. 

## 2014-01-03 NOTE — Telephone Encounter (Signed)
ICM transmission received. I left a message for the patient to call at his home and cell #'s. 

## 2014-01-03 NOTE — Progress Notes (Signed)
EPIC Encounter for ICM Monitoring  Patient Name: [name] is a 73 y.o. male Date: 01/03/2014 Primary Care Physican: Sheela Stack, MD Primary Cardiologist: Mare Ferrari Electrophysiologist: Caryl Comes Dry Weight: 170 lbs   Bi-V pacing: 99%      In the past month, have you:  1. Gained more than 2 pounds in a day or more than 5 pounds in a week? No. Baseline weight coming out of the hospital was 173 lbs. The patient was down to 170 lbs last week when I spoke with him. Today he reports his recent weights from 169.4-170.6 lbs.  2. Had changes in your medications (with verification of current medications)? no  3. Had more shortness of breath than is usual for you? no  4. Limited your activity because of shortness of breath? no  5. Not been able to sleep because of shortness of breath? no  6. Had increased swelling in your feet or ankles? no  7. Had symptoms of dehydration (dizziness, dry mouth, increased thirst, decreased urine output) no  8. Had changes in sodium restriction? no  9. Been compliant with medication? Yes  ** The patient complains of feeling tired this week. I advised him that this could be a combination of what has happened to him, medications, and heat. I have advised him to monitor symptoms and call if any changes. He also reports right sided chest pain, but he wonders if he has a cracked rib from CPR as this is not new. **   ICM trend:   Follow-up plan: ICM clinic phone appointment: 02/07/14. The patient's Corvue readings have been monitored weekly x 4 weeks per MD orders. The patient's Corvue readings and weights have stabilized. I have advised him to call if he experiences increased SOB, weight gain 3 lbs or more in 24 hours or 5 lbs in 1 week, or having edema. Will start to follow his trends monthly. He has a follow up appointment on 01/21/14 with Dr. Mare Ferrari.  Copy of note sent to patient's primary care physician, primary cardiologist, and device  following physician.  Alvis Lemmings, RN, BSN 01/03/2014 11:17 AM

## 2014-01-04 ENCOUNTER — Encounter: Payer: Self-pay | Admitting: Surgery

## 2014-01-07 ENCOUNTER — Ambulatory Visit (INDEPENDENT_AMBULATORY_CARE_PROVIDER_SITE_OTHER)
Admission: RE | Admit: 2014-01-07 | Discharge: 2014-01-07 | Disposition: A | Discharge status: Home / Self Care | Payer: Medicare Other | Source: Ambulatory Visit | Attending: Surgery | Admitting: Surgery

## 2014-01-07 ENCOUNTER — Ambulatory Visit (HOSPITAL_COMMUNITY)
Admission: RE | Admit: 2014-01-07 | Discharge: 2014-01-07 | Disposition: A | Discharge status: Home / Self Care | Payer: Medicare Other | Source: Ambulatory Visit | Attending: Surgery | Admitting: Surgery

## 2014-01-07 ENCOUNTER — Ambulatory Visit: Payer: Medicare Other | Admitting: Family

## 2014-01-07 ENCOUNTER — Encounter: Payer: Self-pay | Admitting: Surgery

## 2014-01-07 ENCOUNTER — Ambulatory Visit (INDEPENDENT_AMBULATORY_CARE_PROVIDER_SITE_OTHER): Payer: Medicare Other | Admitting: Surgery

## 2014-01-07 VITALS — BP 131/79 | HR 74 | Ht 68.0 in | Wt 172.6 lb

## 2014-01-07 DIAGNOSIS — I714 Abdominal aortic aneurysm, without rupture, unspecified: Secondary | ICD-10-CM | POA: Diagnosis not present

## 2014-01-07 DIAGNOSIS — I83893 Varicose veins of bilateral lower extremities with other complications: Secondary | ICD-10-CM

## 2014-01-07 DIAGNOSIS — I2510 Atherosclerotic heart disease of native coronary artery without angina pectoris: Secondary | ICD-10-CM

## 2014-01-07 DIAGNOSIS — Z48812 Encounter for surgical aftercare following surgery on the circulatory system: Secondary | ICD-10-CM | POA: Diagnosis not present

## 2014-01-07 NOTE — Progress Notes (Signed)
Patient name: [name] MRN: [id number] DOB: [birth year] Sex: male     Chief Complaint  Patient presents with  . New Evaluation    large patch of vv's on inside of R thigh    HISTORY OF PRESENT ILLNESS: The patient comes in today for 2 problems #1 his followup of his endovascular aneurysm repair which was done 11/24/2007 by Dr. Amedeo Plenty.  A Endologix device was placed.  He has had continued decrease in the size of his aneurysm sac.  The patient is here primarily as a new patient regarding varicose veins.  The patient states that he has a cluster of veins in his right upper thigh which cause him significant discomfort.  He denies any significant swelling.  His symptoms are worse at the end of the day.  He denies DVT.  Past Medical History  Diagnosis Date  . CAD (coronary artery disease) 1999    Remote anterolater and apical MI, cath 2011 patent stents RCA/LCx  LAD w/o obstruction  . Cardiomyopathy, ischemic     EF 20-25%  echo 2012  . Chronic systolic congestive heart failure   . ICD (implantable cardiac defibrillator) in place     [place] Jude  . 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 Abdominal aortic aneurysm 2009    Repaired with endovascular stent  . PVD (peripheral vascular disease)     Has occluded innominate vein  . Myocardial infarction   . Hypertension   . Ventricular tachycardia     recurrent slow VT at 100-110 bpm,  Rx mexilitene/sotalol  . Left bundle branch block   . Systolic CHF, chronic   . Arthritis   . Gynecomastia     2/2 spironolactone  . Atrial fibrillation     Past Surgical History  Procedure Laterality Date  . Icd  Feb 2012    Change out with LV lead placed with tunneling from the right  to the left side  . Endovascular stent insertion      for AAA  . Cardiac catheterization      x2  . Pacemaker insertion    . Ventricular ablation surgery      s/p prior EPS and ablation at [facility] 9/12, [facility] 11/12, and most recent [facility]  08/23/11  . Tonsillectomy      History   Social History  . Marital Status: Married    Spouse Name: N/A    Number of Children: N/A  . Years of Education: N/A   Occupational History  . retired      used to Armed forces operational officer   Social History Main Topics  . Smoking status: Former Smoker    Quit date: 01/07/1994  . Smokeless tobacco: Former Systems developer    Quit date: 06/01/1987  . Alcohol Use: Yes     Comment: very rare  . Drug Use: No  . Sexual Activity: Not Currently   Other Topics Concern  . Not on file   Social History Narrative  . No narrative on file    Family History  Problem Relation Age of Onset  . Stroke Mother   . Heart attack Mother   . Heart disease Mother   . Hyperlipidemia Mother   . Hypertension Mother   . Heart attack Father   . Heart disease Father   . Hyperlipidemia Father   . Hypertension Father   . Heart disease Sister   . Hyperlipidemia Sister   . Hypertension Brother  Allergies as of 01/07/2014 - Review Complete 01/07/2014  Allergen Reaction Noted  . Iohexol  10/19/2007  . Prednisone Other (See Comments)     Current Outpatient Prescriptions on File Prior to Visit  Medication Sig Dispense Refill  . ALPRAZolam (XANAX) 0.25 MG tablet Take 1 tablet (0.25 mg total) by mouth 4 (four) times daily as needed. For anxiety  360 tablet  1  . Ascorbic Acid (VITAMIN C) 500 MG tablet Take 500 mg by mouth daily.        . carvedilol (COREG) 6.25 MG tablet Take 1 tablet (6.25 mg total) by mouth 2 (two) times daily with a meal. Take one tablet by mouth twice daily  60 tablet  3  . furosemide (LASIX) 20 MG tablet Take 20 mg by mouth daily.      [name] levothyroxine (SYNTHROID, LEVOTHROID) 88 MCG tablet Take 88-132 mcg by mouth daily. Take 1 and 1/2 pill on Thursday      . Loratadine (CLARITIN) 10 MG CAPS Take 1 capsule by mouth daily.       [name] losartan (COZAAR) 50 MG tablet Take 0.5 tablets (25 mg total) by mouth daily.  90 tablet  3  . meclizine (ANTIVERT) 25 MG tablet  Take 25 mg by mouth 3 (three) times daily as needed for dizziness.      . mexiletine (MEXITIL) 150 MG capsule Take 150 mg by mouth 3 (three) times daily.      . Multiple Vitamin (MULITIVITAMIN WITH MINERALS) TABS Take 1 tablet by mouth daily.      . Omega-3 Fatty Acids (FISH OIL) 1000 MG CAPS Take 1,000 mg by mouth 2 (two) times daily.       . rosuvastatin (CRESTOR) 5 MG tablet TAKE 2 TIMES A WEEK OR AS DIRECTED  40 tablet  3  . sotalol (BETAPACE) 80 MG tablet Take 80 mg by mouth 2 (two) times daily.      . traMADol (ULTRAM) 50 MG tablet Take 1 tablet (50 mg total) by mouth every 12 (twelve) hours as needed for moderate pain.  60 tablet  0  . warfarin (COUMADIN) 2.5 MG tablet Take 2.5 mg by mouth daily. As directed by coumadin clinic       No current facility-administered medications on file prior to visit.     REVIEW OF SYSTEMS: Cardiovascular: No chest pain, chest pressure, palpitations, orthopnea, or dyspnea on exertion. No claudication or rest pain,  No history of DVT or phlebitis. Pulmonary: No  asthma or wheezing.  Positive for productive cough Neurologic: No weakness, paresthesias, aphasia, or amaurosis.  Positive for dizziness. Hematologic: No bleeding problems or clotting disorders. Musculoskeletal: No joint pain or joint swelling. Gastrointestinal: No blood in stool or hematemesis Genitourinary: No dysuria or hematuria. Psychiatric:: No history of major depression. Integumentary: No rashes or ulcers. Constitutional: No fever or chills.  PHYSICAL EXAMINATION:   Vital signs are BP 131/79  Pulse 74  Ht 5\' 8"  (1.727 m)  Wt 172 lb 9.6 oz (78.291 kg)  BMI 26.25 kg/m2  SpO2 100% General: The patient appears their stated age. HEENT:  No gross abnormalities Pulmonary:  Non labored breathing Musculoskeletal: There are no major deformities. Neurologic: No focal weakness or paresthesias are detected, Skin: There are no ulcer or rashes noted. Psychiatric: The patient has normal  affect. Cardiovascular: There is a regular rate and rhythm without significant murmur appreciated.  Triphasic Doppler signals in both pedal arteries bilaterally.  Cluster of reticular veins in the right side.  Diagnostic Studies I have reviewed his ultrasound today.  His aneurysm has decreased slightly measuring 2.96 x 2.97.  Previously it was 3.37 x 3.30.  Venous study today shows no evidence of deep vein thrombosis bilaterally.  There is mild common femoral deep vein reflux bilaterally.  There is no superficial venous reflux.  Assessment: #1 Abdominal aortic aneurysm #2: Venous insufficiency Plan: #1: The patient's aneurysm has continued to decrease in size.  He is scheduled for surveillance again in one year. #2: The patient has reticular veins in the right upper thigh which are causing him significant discomfort.  I think he would be a good candidate for sclerotherapy.  I discussed that this would be an out-of-pocket expense.  Thea Silversmith will contact him to discuss having this arranged.  Eldridge Abrahams, M.D. [facility] Office: [phone] Pager:  [phone]

## 2014-01-17 ENCOUNTER — Emergency Department (HOSPITAL_COMMUNITY): Payer: Medicare Other

## 2014-01-17 ENCOUNTER — Inpatient Hospital Stay (HOSPITAL_COMMUNITY)
Admission: EM | Admit: 2014-01-17 | Discharge: 2014-01-20 | DRG: 194 | Disposition: A | Discharge status: Home / Self Care | Payer: Medicare Other | Attending: Endocrinology | Admitting: Endocrinology

## 2014-01-17 ENCOUNTER — Encounter (HOSPITAL_COMMUNITY): Payer: Self-pay | Admitting: Emergency Medicine

## 2014-01-17 DIAGNOSIS — Z9861 Coronary angioplasty status: Secondary | ICD-10-CM

## 2014-01-17 DIAGNOSIS — I5022 Chronic systolic (congestive) heart failure: Secondary | ICD-10-CM

## 2014-01-17 DIAGNOSIS — I2510 Atherosclerotic heart disease of native coronary artery without angina pectoris: Secondary | ICD-10-CM | POA: Diagnosis present

## 2014-01-17 DIAGNOSIS — K5900 Constipation, unspecified: Secondary | ICD-10-CM | POA: Diagnosis present

## 2014-01-17 DIAGNOSIS — E785 Hyperlipidemia, unspecified: Secondary | ICD-10-CM | POA: Diagnosis present

## 2014-01-17 DIAGNOSIS — Z8249 Family history of ischemic heart disease and other diseases of the circulatory system: Secondary | ICD-10-CM

## 2014-01-17 DIAGNOSIS — I10 Essential (primary) hypertension: Secondary | ICD-10-CM | POA: Diagnosis present

## 2014-01-17 DIAGNOSIS — Z7901 Long term (current) use of anticoagulants: Secondary | ICD-10-CM

## 2014-01-17 DIAGNOSIS — I5023 Acute on chronic systolic (congestive) heart failure: Secondary | ICD-10-CM

## 2014-01-17 DIAGNOSIS — I255 Ischemic cardiomyopathy: Secondary | ICD-10-CM

## 2014-01-17 DIAGNOSIS — Z823 Family history of stroke: Secondary | ICD-10-CM

## 2014-01-17 DIAGNOSIS — I252 Old myocardial infarction: Secondary | ICD-10-CM

## 2014-01-17 DIAGNOSIS — Z888 Allergy status to other drugs, medicaments and biological substances status: Secondary | ICD-10-CM

## 2014-01-17 DIAGNOSIS — J189 Pneumonia, unspecified organism: Secondary | ICD-10-CM | POA: Diagnosis not present

## 2014-01-17 DIAGNOSIS — Z87891 Personal history of nicotine dependence: Secondary | ICD-10-CM

## 2014-01-17 DIAGNOSIS — I4891 Unspecified atrial fibrillation: Secondary | ICD-10-CM | POA: Diagnosis present

## 2014-01-17 DIAGNOSIS — R0602 Shortness of breath: Secondary | ICD-10-CM | POA: Diagnosis not present

## 2014-01-17 DIAGNOSIS — Z95810 Presence of automatic (implantable) cardiac defibrillator: Secondary | ICD-10-CM

## 2014-01-17 DIAGNOSIS — I509 Heart failure, unspecified: Secondary | ICD-10-CM | POA: Diagnosis present

## 2014-01-17 DIAGNOSIS — E039 Hypothyroidism, unspecified: Secondary | ICD-10-CM | POA: Diagnosis present

## 2014-01-17 DIAGNOSIS — Z23 Encounter for immunization: Secondary | ICD-10-CM

## 2014-01-17 DIAGNOSIS — I5042 Chronic combined systolic (congestive) and diastolic (congestive) heart failure: Secondary | ICD-10-CM | POA: Diagnosis present

## 2014-01-17 DIAGNOSIS — J9601 Acute respiratory failure with hypoxia: Secondary | ICD-10-CM

## 2014-01-17 DIAGNOSIS — Z8674 Personal history of sudden cardiac arrest: Secondary | ICD-10-CM

## 2014-01-17 DIAGNOSIS — I2589 Other forms of chronic ischemic heart disease: Secondary | ICD-10-CM | POA: Diagnosis present

## 2014-01-17 LAB — BASIC METABOLIC PANEL
Anion gap: 13 (ref 5–15)
BUN: 13 mg/dL (ref 6–23)
CO2: 23 mEq/L (ref 19–32)
Calcium: 9.3 mg/dL (ref 8.4–10.5)
Chloride: 101 mEq/L (ref 96–112)
Creatinine, Ser: 1.06 mg/dL (ref 0.50–1.35)
GFR calc Af Amer: 79 mL/min — ABNORMAL LOW (ref >90)
GFR calc non Af Amer: 68 mL/min — ABNORMAL LOW (ref >90)
Glucose, Bld: 112 mg/dL — ABNORMAL HIGH (ref 70–99)
Potassium: 3.9 mEq/L (ref 3.7–5.3)
Sodium: 137 mEq/L (ref 137–147)

## 2014-01-17 LAB — PROTIME-INR
INR: 1.92 — ABNORMAL HIGH (ref 0.00–1.49)
Prothrombin Time: 22 seconds — ABNORMAL HIGH (ref 11.6–15.2)

## 2014-01-17 LAB — CBC WITH DIFFERENTIAL/PLATELET
Basophils Absolute: 0 10*3/uL (ref 0.0–0.1)
Basophils Relative: 1 % (ref 0–1)
Eosinophils Absolute: 0.1 10*3/uL (ref 0.0–0.7)
Eosinophils Relative: 1 % (ref 0–5)
HCT: 41.4 % (ref 39.0–52.0)
Hemoglobin: 14 g/dL (ref 13.0–17.0)
Lymphocytes Relative: 15 % (ref 12–46)
Lymphs Abs: 1.3 10*3/uL (ref 0.7–4.0)
MCH: 32 pg (ref 26.0–34.0)
MCHC: 33.8 g/dL (ref 30.0–36.0)
MCV: 94.5 fL (ref 78.0–100.0)
Monocytes Absolute: 0.7 10*3/uL (ref 0.1–1.0)
Monocytes Relative: 9 % (ref 3–12)
Neutro Abs: 6.6 10*3/uL (ref 1.7–7.7)
Neutrophils Relative %: 74 % (ref 43–77)
Platelets: 227 10*3/uL (ref 150–400)
RBC: 4.38 MIL/uL (ref 4.22–5.81)
RDW: 12.8 % (ref 11.5–15.5)
WBC: 8.8 10*3/uL (ref 4.0–10.5)

## 2014-01-17 LAB — PRO B NATRIURETIC PEPTIDE: Pro B Natriuretic peptide (BNP): 3228 pg/mL — ABNORMAL HIGH (ref 0–125)

## 2014-01-17 LAB — TROPONIN I: Troponin I: <0.3 ng/mL (ref <0.30)

## 2014-01-17 MED ORDER — PIPERACILLIN-TAZOBACTAM 3.375 G IVPB 30 MIN
3.3750 g | Freq: Once | INTRAVENOUS | Status: AC
  Administered 2014-01-18: 3.375 g via INTRAVENOUS
  Filled 2014-01-17: qty 50

## 2014-01-17 MED ORDER — VANCOMYCIN HCL IN DEXTROSE 1-5 GM/200ML-% IV SOLN
1000.0000 mg | Freq: Once | INTRAVENOUS | Status: AC
  Administered 2014-01-18: 1000 mg via INTRAVENOUS
  Filled 2014-01-17: qty 200

## 2014-01-17 NOTE — ED Notes (Signed)
Pt here via EMS from home with c/o SOB x 1 day. Pt was on a flight from [place] to [place] and started having SOB when he landed. Pt has history of same symptom 6 weeks ago where he went into cardiac arrest and was brought back with CPR. Pt has hx of MI 35 years ago, has a Diplomatic Services operational officer at 70bpm, hx of CHF. Pt states that he took on extra 25mg  lasix today to try to alleviate SOB. Pt was given albuterol treatment from EMS with noted wheezing still present. Vitals stable. NAD noted. Pt 97% on 3L [place].

## 2014-01-17 NOTE — ED Notes (Signed)
MD at bedside. 

## 2014-01-17 NOTE — ED Provider Notes (Signed)
CSN: [id number]     Arrival date & time 01/17/14  2013 History   First MD Initiated Contact with Patient 01/17/14 2041     Chief Complaint  Patient presents with  . Shortness of Breath     (Consider location/radiation/quality/duration/timing/severity/associated sxs/prior Treatment) HPI  73yM with dyspnea/fatigue. Significant cardiac hx with CAD s/p PCI to the LAD, LCx, RCA, HFr EF 25%,  VT s/p ablation and CRT-D. Pt suffered an out of hospital cardiac arrest last month. It started by him complaining of shortness of breath. He became unresponsive and lost pulses after EMS arrival. It appeared to be a primary arrhythmic issue with the onset of slow SVT. The device tried to terminated, but this gave rise to short runs of self-terminating ventricular tachycardia. The SVT was felt to have caused the respiratory failure.  He reports he has actually been doing well since discharge. Has had continued CP since discharge which he describes as soreness. This has been progressively improving. Today was coming home from trip visiting son in [place] when began feeling sob and generally tired. Concerned with his recent history so took additional dose of lasix and came to ED. Coughing with scant hemoptysis. Did have this during hospitalization and was felt to be 2/2 CPR. This improved until today when began again. He is on coumadin. Reports INR 2.4 on last check. No fever or chills. Has been vigilant about checking weight daily and has been stable. Reports compliance with meds. Noted to be hypoxic on RA at 88% on arrival. No home oxygen use. No unusual leg pain or swelling.    Past Medical History  Diagnosis Date  . CAD (coronary artery disease) 1999    Remote anterolater and apical MI, cath 2011 patent stents RCA/LCx  LAD w/o obstruction  . Cardiomyopathy, ischemic     EF 20-25%  echo 2012  . Chronic systolic congestive heart failure   . ICD (implantable cardiac defibrillator) in place     [place] Jude  .  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 Abdominal aortic aneurysm 2009    Repaired with endovascular stent  . PVD (peripheral vascular disease)     Has occluded innominate vein  . Myocardial infarction   . Hypertension   . Ventricular tachycardia     recurrent slow VT at 100-110 bpm,  Rx mexilitene/sotalol  . Left bundle branch block   . Systolic CHF, chronic   . Arthritis   . Gynecomastia     2/2 spironolactone  . Atrial fibrillation    Past Surgical History  Procedure Laterality Date  . Icd  Feb 2012    Change out with LV lead placed with tunneling from the right  to the left side  . Endovascular stent insertion      for AAA  . Cardiac catheterization      x2  . Pacemaker insertion    . Ventricular ablation surgery      s/p prior EPS and [facility] at [facility] 9/12, [facility] 11/12, and most recent at [facility] 08/23/11  . Tonsillectomy     Family History  Problem Relation Age of Onset  . Stroke Mother   . Heart attack Mother   . Heart disease Mother   . Hyperlipidemia Mother   . Hypertension Mother   . Heart attack Father   . Heart disease Father   . Hyperlipidemia Father   . Hypertension Father   . Heart disease Sister   . Hyperlipidemia Sister   .  Hypertension Brother    History  Substance Use Topics  . Smoking status: Former Smoker    Quit date: 01/07/1994  . Smokeless tobacco: Former Systems developer    Quit date: 06/01/1987  . Alcohol Use: Yes     Comment: very rare    Review of Systems  All systems reviewed and negative, other than as noted in HPI.   Allergies  Iohexol and Prednisone  Home Medications   Prior to Admission medications   Medication Sig Start Date End Date Taking? Authorizing Provider  ALPRAZolam (XANAX) 0.25 MG tablet Take 0.125-0.25 mg by mouth 4 (four) times daily.   Yes Historical Provider, MD  Ascorbic Acid (VITAMIN C) 500 MG tablet Take 500 mg by mouth daily.     Yes Historical Provider, MD  carvedilol (COREG) 6.25 MG tablet Take 1  tablet (6.25 mg total) by mouth 2 (two) times daily with a meal. Take one tablet by mouth twice daily 12/08/13  Yes Rhonda G Barrett, PA-C  Docusate Calcium (STOOL SOFTENER PO) Take 1 capsule by mouth daily.   Yes Historical Provider, MD  furosemide (LASIX) 20 MG tablet Take 20 mg by mouth daily. 12/08/13  Yes Rhonda G Barrett, PA-C  levothyroxine (SYNTHROID, LEVOTHROID) 88 MCG tablet Take 88-132 mcg by mouth daily. Take 1.5 tablets on Friday and 1 tablet the rest of the week.   Yes Historical Provider, MD  loratadine (CLARITIN) 10 MG tablet Take 10 mg by mouth daily.   Yes Historical Provider, MD  losartan (COZAAR) 50 MG tablet Take 25 mg by mouth 2 (two) times daily.   Yes Historical Provider, MD  meclizine (ANTIVERT) 25 MG tablet Take 25 mg by mouth 3 (three) times daily.    Yes Historical Provider, MD  mexiletine (MEXITIL) 150 MG capsule Take 150 mg by mouth 3 (three) times daily.   Yes Historical Provider, MD  Multiple Vitamin (MULTIVITAMIN WITH MINERALS) TABS tablet Take 1 tablet by mouth daily.   Yes Historical Provider, MD  Omega-3 Fatty Acids (FISH OIL) 1000 MG CAPS Take 1,000 mg by mouth 2 (two) times daily.    Yes Historical Provider, MD  ranitidine (ZANTAC) 150 MG tablet Take 150 mg by mouth every evening.   Yes Historical Provider, MD  rosuvastatin (CRESTOR) 5 MG tablet Take 5 mg by mouth 2 (two) times a week. Every Tuesday and Saturday   Yes Historical Provider, MD  sotalol (BETAPACE) 80 MG tablet Take 80 mg by mouth 2 (two) times daily.   Yes Historical Provider, MD  traMADol (ULTRAM) 50 MG tablet Take 1 tablet (50 mg total) by mouth every 12 (twelve) hours as needed for moderate pain. 12/18/13  Yes Darlin Coco, MD  warfarin (COUMADIN) 2.5 MG tablet Take 2.5-3.75 mg by mouth See admin instructions. Take one 2.5mg  tablet daily except every Friday take 1.5 tablets to equal 3.75mg .   Yes Historical Provider, MD   BP 123/74  Pulse 74  Temp(Src) 98.2 F (36.8 C) (Oral)  Resp 18  SpO2  99% Physical Exam  Nursing note and vitals reviewed. Constitutional: He appears well-developed and well-nourished. No distress.  HENT:  Head: Normocephalic and atraumatic.  Eyes: Conjunctivae are normal. Right eye exhibits no discharge. Left eye exhibits no discharge.  Neck: Neck supple.  Cardiovascular: Normal rate, regular rhythm and normal heart sounds.  Exam reveals no gallop and no friction rub.   No murmur heard. Pulmonary/Chest: Effort normal and breath sounds normal. No respiratory distress.  Rhonchi/rales R lower lobe.   Abdominal:  Soft. He exhibits no distension. There is no tenderness.  Musculoskeletal: He exhibits no edema and no tenderness.  Neurological: He is alert.  Skin: Skin is warm and dry.  Psychiatric: He has a normal mood and affect. His behavior is normal. Thought content normal.    ED Course  Procedures (including critical care time) Labs Review Labs Reviewed  BASIC METABOLIC PANEL - Abnormal; Notable for the following:    Glucose, Bld 112 (*)    GFR calc non Af Amer 68 (*)    GFR calc Af Amer 79 (*)    All other components within normal limits  PRO B NATRIURETIC PEPTIDE - Abnormal; Notable for the following:    Pro B Natriuretic peptide (BNP) 3228.0 (*)    All other components within normal limits  PROTIME-INR - Abnormal; Notable for the following:    Prothrombin Time 22.0 (*)    INR 1.92 (*)    All other components within normal limits  TROPONIN I  CBC WITH DIFFERENTIAL    Imaging Review Dg Chest 2 View  01/17/2014   CLINICAL DATA:  Shortness of breath.  EXAM: CHEST  2 VIEW  COMPARISON:  12/07/2013  FINDINGS: The heart is enlarged but stable. Stable pacer wires/ AICD. Asymmetric airspace process in the lungs could reflect asymmetric edema or infiltrates. No definite pleural effusions or pneumothorax.  IMPRESSION: Cardiac enlargement and asymmetric airspace process, favor infiltrates over asymmetric edema.   Electronically Signed   By: Kalman Jewels  M.D.   On: 01/17/2014 23:26     EKG Interpretation   Date/Time:  Thursday January 17 2014 23:22:25 EDT Ventricular Rate:  72 PR Interval:  163 QRS Duration: 178 QT Interval:  478 QTC Calculation: 523 R Axis:   -69 Text Interpretation:  A-V dual-paced rhythm  Confirmed by Wilson Singer  MD,  Mattea Seger ([id number]) on 01/18/2014 12:43:32 AM      MDM   Final diagnoses:  HCAP (healthcare-associated pneumonia)    42yM with fatigue/dyspnea. Extensive cardiac hx. Consider worsening HF. Weight has been stable though. No peripheral edema on exam. CXR with what I suspect are infiltrates versus edema. Consider PE, probably less likely with INR of 1.9. Unfortunately contrast allergy precluding CT unless pre treated. Abx ordered to cover for HCAP. Admission.     Virgel Manifold, MD 01/18/14 [phone]

## 2014-01-18 ENCOUNTER — Inpatient Hospital Stay (HOSPITAL_COMMUNITY): Payer: Medicare Other

## 2014-01-18 DIAGNOSIS — I5022 Chronic systolic (congestive) heart failure: Secondary | ICD-10-CM

## 2014-01-18 DIAGNOSIS — Z23 Encounter for immunization: Secondary | ICD-10-CM | POA: Diagnosis not present

## 2014-01-18 DIAGNOSIS — E039 Hypothyroidism, unspecified: Secondary | ICD-10-CM | POA: Diagnosis present

## 2014-01-18 DIAGNOSIS — I252 Old myocardial infarction: Secondary | ICD-10-CM | POA: Diagnosis not present

## 2014-01-18 DIAGNOSIS — Z87891 Personal history of nicotine dependence: Secondary | ICD-10-CM | POA: Diagnosis not present

## 2014-01-18 DIAGNOSIS — J9600 Acute respiratory failure, unspecified whether with hypoxia or hypercapnia: Secondary | ICD-10-CM

## 2014-01-18 DIAGNOSIS — I2589 Other forms of chronic ischemic heart disease: Secondary | ICD-10-CM | POA: Diagnosis present

## 2014-01-18 DIAGNOSIS — J189 Pneumonia, unspecified organism: Secondary | ICD-10-CM | POA: Diagnosis present

## 2014-01-18 DIAGNOSIS — I10 Essential (primary) hypertension: Secondary | ICD-10-CM | POA: Diagnosis present

## 2014-01-18 DIAGNOSIS — R0602 Shortness of breath: Secondary | ICD-10-CM | POA: Diagnosis present

## 2014-01-18 DIAGNOSIS — I5042 Chronic combined systolic (congestive) and diastolic (congestive) heart failure: Secondary | ICD-10-CM | POA: Diagnosis present

## 2014-01-18 DIAGNOSIS — Z8674 Personal history of sudden cardiac arrest: Secondary | ICD-10-CM | POA: Diagnosis not present

## 2014-01-18 DIAGNOSIS — Z7901 Long term (current) use of anticoagulants: Secondary | ICD-10-CM | POA: Diagnosis not present

## 2014-01-18 DIAGNOSIS — Z8249 Family history of ischemic heart disease and other diseases of the circulatory system: Secondary | ICD-10-CM | POA: Diagnosis not present

## 2014-01-18 DIAGNOSIS — I2510 Atherosclerotic heart disease of native coronary artery without angina pectoris: Secondary | ICD-10-CM | POA: Diagnosis present

## 2014-01-18 DIAGNOSIS — Z95810 Presence of automatic (implantable) cardiac defibrillator: Secondary | ICD-10-CM | POA: Diagnosis not present

## 2014-01-18 DIAGNOSIS — Z9861 Coronary angioplasty status: Secondary | ICD-10-CM | POA: Diagnosis not present

## 2014-01-18 DIAGNOSIS — I4891 Unspecified atrial fibrillation: Secondary | ICD-10-CM | POA: Diagnosis present

## 2014-01-18 DIAGNOSIS — Z888 Allergy status to other drugs, medicaments and biological substances status: Secondary | ICD-10-CM | POA: Diagnosis not present

## 2014-01-18 DIAGNOSIS — E785 Hyperlipidemia, unspecified: Secondary | ICD-10-CM | POA: Diagnosis present

## 2014-01-18 DIAGNOSIS — Z823 Family history of stroke: Secondary | ICD-10-CM | POA: Diagnosis not present

## 2014-01-18 DIAGNOSIS — I509 Heart failure, unspecified: Secondary | ICD-10-CM | POA: Diagnosis present

## 2014-01-18 DIAGNOSIS — K5900 Constipation, unspecified: Secondary | ICD-10-CM | POA: Diagnosis present

## 2014-01-18 LAB — D-DIMER, QUANTITATIVE: D-Dimer, Quant: 2.23 ug/mL-FEU — ABNORMAL HIGH (ref 0.00–0.48)

## 2014-01-18 LAB — PROTIME-INR
INR: 2.04 — ABNORMAL HIGH (ref 0.00–1.49)
Prothrombin Time: 23 seconds — ABNORMAL HIGH (ref 11.6–15.2)

## 2014-01-18 MED ORDER — WARFARIN SODIUM 2 MG PO TABS
2.0000 mg | ORAL_TABLET | Freq: Once | ORAL | Status: AC
  Administered 2014-01-18: 2 mg via ORAL
  Filled 2014-01-18: qty 1

## 2014-01-18 MED ORDER — FAMOTIDINE 20 MG PO TABS
20.0000 mg | ORAL_TABLET | Freq: Every day | ORAL | Status: DC
  Administered 2014-01-18 – 2014-01-20 (×3): 20 mg via ORAL
  Filled 2014-01-18 (×3): qty 1

## 2014-01-18 MED ORDER — SODIUM CHLORIDE 0.9 % IV SOLN: 250.0000 mL | INTRAVENOUS | Status: DC | PRN

## 2014-01-18 MED ORDER — ACETAMINOPHEN 325 MG PO TABS
650.0000 mg | ORAL_TABLET | Freq: Four times a day (QID) | ORAL | Status: DC | PRN
  Administered 2014-01-18 – 2014-01-20 (×2): 650 mg via ORAL
  Filled 2014-01-18 (×3): qty 2

## 2014-01-18 MED ORDER — POLYETHYLENE GLYCOL 3350 17 G PO PACK
17.0000 g | PACK | Freq: Every day | ORAL | Status: DC | PRN
  Filled 2014-01-18: qty 1

## 2014-01-18 MED ORDER — ACETAMINOPHEN 650 MG RE SUPP: 650.0000 mg | Freq: Four times a day (QID) | RECTAL | Status: DC | PRN

## 2014-01-18 MED ORDER — WARFARIN - PHARMACIST DOSING INPATIENT: Freq: Every day | Status: DC

## 2014-01-18 MED ORDER — ZOLPIDEM TARTRATE 5 MG PO TABS
5.0000 mg | ORAL_TABLET | Freq: Every evening | ORAL | Status: DC | PRN
  Filled 2014-01-18: qty 1

## 2014-01-18 MED ORDER — TECHNETIUM TO 99M ALBUMIN AGGREGATED
6.0000 | Freq: Once | INTRAVENOUS | Status: AC | PRN
  Administered 2014-01-18: 6 via INTRAVENOUS

## 2014-01-18 MED ORDER — ALPRAZOLAM 0.25 MG PO TABS
0.1250 mg | ORAL_TABLET | Freq: Three times a day (TID) | ORAL | Status: DC
  Administered 2014-01-18 – 2014-01-20 (×10): 0.25 mg via ORAL
  Filled 2014-01-18 (×10): qty 1

## 2014-01-18 MED ORDER — TECHNETIUM TC 99M DIETHYLENETRIAME-PENTAACETIC ACID: 40.0000 | Freq: Once | INTRAVENOUS | Status: AC | PRN

## 2014-01-18 MED ORDER — SOTALOL HCL 80 MG PO TABS
80.0000 mg | ORAL_TABLET | Freq: Two times a day (BID) | ORAL | Status: DC
  Administered 2014-01-18 – 2014-01-20 (×6): 80 mg via ORAL
  Filled 2014-01-18 (×7): qty 1

## 2014-01-18 MED ORDER — LOSARTAN POTASSIUM 25 MG PO TABS
25.0000 mg | ORAL_TABLET | Freq: Two times a day (BID) | ORAL | Status: DC
  Administered 2014-01-18 – 2014-01-20 (×6): 25 mg via ORAL
  Filled 2014-01-18 (×7): qty 1

## 2014-01-18 MED ORDER — VITAMIN C 500 MG PO TABS
500.0000 mg | ORAL_TABLET | Freq: Every day | ORAL | Status: DC
  Administered 2014-01-18 – 2014-01-20 (×3): 500 mg via ORAL
  Filled 2014-01-18 (×3): qty 1

## 2014-01-18 MED ORDER — ATORVASTATIN CALCIUM 10 MG PO TABS
10.0000 mg | ORAL_TABLET | Freq: Every day | ORAL | Status: DC
  Administered 2014-01-18 – 2014-01-19 (×2): 10 mg via ORAL
  Filled 2014-01-18 (×3): qty 1

## 2014-01-18 MED ORDER — ADULT MULTIVITAMIN W/MINERALS CH
1.0000 | ORAL_TABLET | Freq: Every day | ORAL | Status: DC
  Administered 2014-01-18 – 2014-01-20 (×3): 1 via ORAL
  Filled 2014-01-18 (×3): qty 1

## 2014-01-18 MED ORDER — TRAMADOL HCL 50 MG PO TABS
50.0000 mg | ORAL_TABLET | Freq: Two times a day (BID) | ORAL | Status: DC | PRN
  Administered 2014-01-18 – 2014-01-19 (×2): 50 mg via ORAL
  Filled 2014-01-18 (×2): qty 1

## 2014-01-18 MED ORDER — MEXILETINE HCL 150 MG PO CAPS
150.0000 mg | ORAL_CAPSULE | Freq: Three times a day (TID) | ORAL | Status: DC
  Administered 2014-01-18 – 2014-01-20 (×7): 150 mg via ORAL
  Filled 2014-01-18 (×10): qty 1

## 2014-01-18 MED ORDER — PNEUMOCOCCAL VAC POLYVALENT 25 MCG/0.5ML IJ INJ
0.5000 mL | INJECTION | Freq: Once | INTRAMUSCULAR | Status: DC
  Filled 2014-01-18: qty 0.5

## 2014-01-18 MED ORDER — FUROSEMIDE 20 MG PO TABS
20.0000 mg | ORAL_TABLET | Freq: Every day | ORAL | Status: DC
  Administered 2014-01-18: 20 mg via ORAL
  Filled 2014-01-18 (×2): qty 1

## 2014-01-18 MED ORDER — WARFARIN SODIUM 2.5 MG PO TABS: 2.5000 mg | ORAL_TABLET | ORAL | Status: DC

## 2014-01-18 MED ORDER — LEVOTHYROXINE SODIUM 88 MCG PO TABS: 88.0000 ug | ORAL_TABLET | Freq: Every day | ORAL | Status: DC

## 2014-01-18 MED ORDER — LEVOTHYROXINE SODIUM 88 MCG PO TABS
132.0000 ug | ORAL_TABLET | ORAL | Status: DC
  Administered 2014-01-18: 132 ug via ORAL
  Filled 2014-01-18 (×2): qty 1.5

## 2014-01-18 MED ORDER — SODIUM CHLORIDE 0.9 % IJ SOLN
3.0000 mL | Freq: Two times a day (BID) | INTRAMUSCULAR | Status: DC
  Administered 2014-01-18: 3 mL via INTRAVENOUS

## 2014-01-18 MED ORDER — DOCUSATE SODIUM 100 MG PO CAPS
100.0000 mg | ORAL_CAPSULE | Freq: Every day | ORAL | Status: DC
  Administered 2014-01-18 – 2014-01-20 (×3): 100 mg via ORAL
  Filled 2014-01-18 (×3): qty 1

## 2014-01-18 MED ORDER — LORATADINE 10 MG PO TABS
10.0000 mg | ORAL_TABLET | Freq: Every day | ORAL | Status: DC
  Administered 2014-01-18 – 2014-01-20 (×3): 10 mg via ORAL
  Filled 2014-01-18 (×3): qty 1

## 2014-01-18 MED ORDER — OMEGA-3-ACID ETHYL ESTERS 1 G PO CAPS
2.0000 g | ORAL_CAPSULE | Freq: Two times a day (BID) | ORAL | Status: DC
  Administered 2014-01-18 – 2014-01-19 (×4): 2 g via ORAL
  Filled 2014-01-18 (×7): qty 2

## 2014-01-18 MED ORDER — LEVOTHYROXINE SODIUM 88 MCG PO TABS
88.0000 ug | ORAL_TABLET | ORAL | Status: DC
  Administered 2014-01-19 – 2014-01-20 (×2): 88 ug via ORAL
  Filled 2014-01-18 (×3): qty 1

## 2014-01-18 MED ORDER — MECLIZINE HCL 25 MG PO TABS
25.0000 mg | ORAL_TABLET | Freq: Three times a day (TID) | ORAL | Status: DC
  Administered 2014-01-18 – 2014-01-20 (×7): 25 mg via ORAL
  Filled 2014-01-18 (×9): qty 1

## 2014-01-18 MED ORDER — SODIUM CHLORIDE 0.9 % IJ SOLN: 3.0000 mL | INTRAMUSCULAR | Status: DC | PRN

## 2014-01-18 MED ORDER — VANCOMYCIN HCL IN DEXTROSE 1-5 GM/200ML-% IV SOLN
1000.0000 mg | Freq: Two times a day (BID) | INTRAVENOUS | Status: DC
  Administered 2014-01-18 – 2014-01-20 (×4): 1000 mg via INTRAVENOUS
  Filled 2014-01-18 (×5): qty 200

## 2014-01-18 MED ORDER — CARVEDILOL 6.25 MG PO TABS
6.2500 mg | ORAL_TABLET | Freq: Two times a day (BID) | ORAL | Status: DC
  Administered 2014-01-18 – 2014-01-20 (×5): 6.25 mg via ORAL
  Filled 2014-01-18 (×7): qty 1

## 2014-01-18 MED ORDER — PIPERACILLIN-TAZOBACTAM 3.375 G IVPB
3.3750 g | Freq: Three times a day (TID) | INTRAVENOUS | Status: DC
  Administered 2014-01-18 – 2014-01-20 (×7): 3.375 g via INTRAVENOUS
  Filled 2014-01-18 (×9): qty 50

## 2014-01-18 MED ORDER — WARFARIN SODIUM 2.5 MG PO TABS
3.7500 mg | ORAL_TABLET | Freq: Once | ORAL | Status: AC
  Administered 2014-01-18: 3.75 mg via ORAL
  Filled 2014-01-18: qty 1

## 2014-01-18 NOTE — Progress Notes (Signed)
ANTICOAGULATION and ANTIBIOTIC CONSULT NOTE - Initial Consult  Pharmacy Consult for Vancomycin/Zosyn Indication: r/o PNA  Pharmacy Consult for Warfarin  Indication: Afib  Allergies  Allergen Reactions  . Iohexol      Code: HIVES, Desc: hives w/ itching during cardiac cath '99, mult caths since w/ ? premeds, DR.G.HAYES REQUESTS 13 HR PRE MED//A.C.pt okay w/13 hr prep/mms, Onset Date: 80998338   . Prednisone Other (See Comments)    Hives    Patient Measurements: ~78 kg  Vital Signs: Temp: 98.2 F (36.8 C) (08/20 2032) Temp src: Oral (08/20 2032) BP: 116/54 mmHg (08/21 0045) Pulse Rate: 70 (08/21 0045)  Labs:  Recent Labs  01/17/14 2153  HGB 14.0  HCT 41.4  PLT 227  LABPROT 22.0*  INR 1.92*  CREATININE 1.06  TROPONINI <0.30    The CrCl is unknown because both a height and weight (above a minimum accepted value) are required for this calculation.   Medical History: Past Medical History  Diagnosis Date  . CAD (coronary artery disease) 1999    Remote anterolater and apical MI, cath 2011 patent stents RCA/LCx  LAD w/o obstruction  . Cardiomyopathy, ischemic     EF 20-25%  echo 2012  . Chronic systolic congestive heart failure   . ICD (implantable cardiac defibrillator) in place     [place] Jude  . 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 Abdominal aortic aneurysm 2009    Repaired with endovascular stent  . PVD (peripheral vascular disease)     Has occluded innominate vein  . Myocardial infarction   . Hypertension   . Ventricular tachycardia     recurrent slow VT at 100-110 bpm,  Rx mexilitene/sotalol  . Left bundle branch block   . Systolic CHF, chronic   . Arthritis   . Gynecomastia     2/2 spironolactone  . Atrial fibrillation     Medications:  Warfarin 2.5 mg daily except 3.75 mg on Fridays  Assessment: 73 y/o M to start broad spectrum antibiotics for possible PNA. WBC wnl, renal function ok, other labs as above.   Also on warfarin for afib, INR  1.92 on admit, CBC good.   Goal of Therapy:  Vancomycin trough 15-20 mg/L INR 2-3 Monitor platelets by anticoagulation protocol: Yes   Plan:  -Vancomycin 1000 mg IV q12h -Zosyn 3.375G IV q8h to be infused over 4 hours -Trend WBC, temp, renal function  -Drug level as indicated   -Warfarin 2 mg PO x 1 now -Daily PT/INR, adjust dose as needed -Monitor for bleeding  Narda Bonds 01/18/2014,1:34 AM

## 2014-01-18 NOTE — Progress Notes (Signed)
Utilization review completed. Rebeckah Masih, RN, BSN. 

## 2014-01-18 NOTE — Consult Note (Signed)
CARDIOLOGY CONSULT NOTE   Patient ID: [name] MRN: [id number], DOB/AGE: [birth year]   Admit date: 01/17/2014 Date of Consult: 01/18/2014   Primary Physician: Sheela Stack, MD Primary Cardiologist: Dr. Mare Ferrari Primary Electrophysiologist: Dr. Caryl Comes  Pt. Profile  73 year old Caucasian male with past medical history significant for atrial fibrillation on chronic Coumadin, hyperlipidemia, hypothyroidism, history of combined systolic and diastolic heart failure, history of ischemic cardiomyopathy secondary to remote anterior infarct with baseline EF 25%, history of apical aneurysm and a history of VT status post St. Jude biventricular ICD placement  Problem List  Past Medical History  Diagnosis Date  . CAD (coronary artery disease) 1999    Remote anterolater and apical MI, cath 2011 patent stents RCA/LCx  LAD w/o obstruction  . Cardiomyopathy, ischemic     EF 20-25%  echo 2012  . Chronic systolic congestive heart failure   . ICD (implantable cardiac defibrillator) in place     [place] Jude  . 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 Abdominal aortic aneurysm 2009    Repaired with endovascular stent  . PVD (peripheral vascular disease)     Has occluded innominate vein  . Myocardial infarction   . Hypertension   . Ventricular tachycardia     recurrent slow VT at 100-110 bpm,  Rx mexilitene/sotalol  . Left bundle branch block   . Systolic CHF, chronic   . Arthritis   . Gynecomastia     2/2 spironolactone  . Atrial fibrillation     Past Surgical History  Procedure Laterality Date  . Icd  Feb 2012    Change out with LV lead placed with tunneling from the right  to the left side  . Endovascular stent insertion      for AAA  . Cardiac catheterization      x2  . Pacemaker insertion    . Ventricular ablation surgery      s/p prior EPS and ablation at [facility] 9/12, [facility] 11/12, and most recent [facility] 08/23/11  . Tonsillectomy        Allergies  Allergies  Allergen Reactions  . Iohexol      Code: HIVES, Desc: hives w/ itching during cardiac cath '99, mult caths since w/ ? premeds, DR.G.HAYES REQUESTS 13 HR PRE MED//A.C.pt okay w/13 hr prep/mms, Onset Date: [id number]   . Prednisone Other (See Comments)    Hives    HPI   The patient is a pleasant 73 year old Caucasian male with past medical history significant for atrial fibrillation on chronic Coumadin, hyperlipidemia, hypothyroidism, history of combined systolic and diastolic heart failure, history of ischemic cardiomyopathy secondary to anterior infarct with baseline EF 25%, history of apical aneurysm and a history of VT status post [employer]. Jude biventricular ICD placement. He had a history of 3 VT ablation procedures. The last procedure was done by Dr. Caryl Comes in 2013.   Patient was admitted on 12/05/2013 after having a respiratory arrest requiring brief CPR by EMS at home. Review of his St. Jude ICD by electrophysiology service showed he had supraventricular tachycardia prompting ICD attempt to pace him out of it which may have triggered the respiratory arrest. His device was readjusted so it will not respond to SVT but only to VT. He was also having some acute on chronic combined systolic and diastolic heart failure at that time and was diuresed with IV Lasix down to 176 pounds prior to discharge. Echo obtained during the last admission on 12/06/2013 showed EF  stable around 20-25%, akinesis of the anteroseptal myocardium, apical aneurysm noted, grade 2 diastolic dysfunction. Due to his hypotension, he was discharged on a reduced dose of carvedilol and Cozaar. Patient had a hemoptysis during the last admission secondary to CPR, his Coumadin was held with plan to restart Coumadin once he is cleared as outpatient basis by Dr. Mare Ferrari. Since his discharge, patient has been doing well. He's been compliant with his medication, fluid restriction and sodium restriction as well. His last  visit with Dr. Mare Ferrari was on 12/11/2013, at which time his Coumadin was restarted and she was started on Lasix 20 mg daily instead of PRN. In the past month, patient has been remotely monitoring his impedance level via Corvue. His weight has also decreased from 178 down to 170 during the last Corvue checkup. In fact the patient has been doing so well, it was decided, his next Corvue can be potentially checked once a month.   For the past week, patient has been in [place] visiting his son. According to his wife, he has been trying to limit the amount of salt intake, however he did go to restaurant on several occasion and requested no salt added to all of his food. Patient returned to [facility] in the afternoon of 01/17/2014. He felt fine walking into the airplane. The entire drip to took 2 hours. When the plane landed around 6 PM, as he was walk out, he had sudden onset of shortness of breath mainly associated with exertion. As soon as they arrived home, patient's wife decided to call EMS. According to the patient, he denies any significant calf tenderness, however record does show that his INR has been subtherapeutic for several times in the last few checkup. Of note, he also started having some scant hemoptysis as well since his return from [place].  On arrival to [facility] ED, he denies any significant fever or chill, however his x-ray favors pneumonia overt fluid overload. His proBNP was elevated at 3228 which mildly increased from his last admission which was 2400. Patient denies any significant weight gain recently, and he's been measuring his weight every single day. His O2 saturation was also in the 80s were in the ED. Other significant laboratory finding include sodium 137, potassium 3.9, creatinine 1.06, negative troponin, INR of 1.92. Given his recent travel and sudden onset of shortness of breath, a d-dimer was obtained it was elevated at 2.23. Cardiology was consulted for shortness of  breath  Inpatient Medications  . ALPRAZolam  0.125-0.25 mg Oral TID AC & HS  . atorvastatin  10 mg Oral q1800  . carvedilol  6.25 mg Oral BID WC  . docusate sodium  100 mg Oral Daily  . famotidine  20 mg Oral Daily  . furosemide  20 mg Oral Daily  . [START ON 01/19/2014] levothyroxine  88 mcg Oral Once per day on Sun Mon Tue Wed Thu Sat   And  . levothyroxine  132 mcg Oral Q Fri  . loratadine  10 mg Oral Daily  . losartan  25 mg Oral BID  . meclizine  25 mg Oral TID  . mexiletine  150 mg Oral 3 times per day  . multivitamin with minerals  1 tablet Oral Daily  . omega-3 acid ethyl esters  2 g Oral BID  . piperacillin-tazobactam (ZOSYN)  IV  3.375 g Intravenous 3 times per day  . pneumococcal 23 valent vaccine  0.5 mL Intramuscular Once  . sodium chloride  3 mL  Intravenous Q12H  . sotalol  80 mg Oral BID  . vancomycin  1,000 mg Intravenous Q12H  . vitamin C  500 mg Oral Daily  . Warfarin - Pharmacist Dosing Inpatient   Does not apply q1800    Family History Family History  Problem Relation Age of Onset  . Stroke Mother   . Heart attack Mother   . Heart disease Mother   . Hyperlipidemia Mother   . Hypertension Mother   . Heart attack Father   . Heart disease Father   . Hyperlipidemia Father   . Hypertension Father   . Heart disease Sister   . Hyperlipidemia Sister   . Hypertension Brother      Social History History   Social History  . Marital Status: Married    Spouse Name: N/A    Number of Children: N/A  . Years of Education: N/A   Occupational History  . retired      used to Armed forces operational officer   Social History Main Topics  . Smoking status: Former Smoker    Quit date: 01/07/1994  . Smokeless tobacco: Former Systems developer    Quit date: 06/01/1987  . Alcohol Use: Yes     Comment: very rare  . Drug Use: No  . Sexual Activity: Not Currently   Other Topics Concern  . Not on file   Social History Narrative  . No narrative on file     Review of  Systems  General:  No chills, fever, night sweats or weight changes. No significant weight change Cardiovascular:  No chest pain, edema, orthopnea, palpitations, paroxysmal nocturnal dyspnea. +dyspnea on exertion, new onset yesterday after getting out of airplan Dermatological: No rash, lesions/masses Respiratory: No dyspnea, mild cough with hemoptysis Urologic: No hematuria, dysuria Abdominal:   No nausea, vomiting, diarrhea, bright red blood per rectum, melena, or hematemesis Neurologic:  No visual changes, wkns, changes in mental status. All other systems reviewed and are otherwise negative except as noted above.  Physical Exam  Blood pressure 120/60, pulse 70, temperature 98.1 F (36.7 C), temperature source Oral, resp. rate 16, height 5\' 8"  (1.727 m), weight 170 lb 3.1 oz (77.2 kg), SpO2 100.00%.  General: Pleasant, NAD Psych: Normal affect. Neuro: Alert and oriented X 3. Moves all extremities spontaneously. HEENT: Normal  Neck: Supple without bruits. Jvp 6 Lungs:  Resp regular and unlabored, CTA. Heart: RRR no s3, s4, or murmurs. Abdomen: Soft, non-tender, non-distended, BS + x 4.  Extremities: No clubbing, cyanosis or edema. DP/PT/Radials 2+ and equal bilaterally. Negative homans  Labs   Recent Labs  01/17/14 2153  TROPONINI <0.30   Lab Results  Component Value Date   WBC 8.8 01/17/2014   HGB 14.0 01/17/2014   HCT 41.4 01/17/2014   MCV 94.5 01/17/2014   PLT 227 01/17/2014    Recent Labs Lab 01/17/14 2153  NA 137  K 3.9  CL 101  CO2 23  BUN 13  CREATININE 1.06  CALCIUM 9.3  GLUCOSE 112*   Lab Results  Component Value Date   CHOL 129 12/18/2013   HDL 31.20* 12/18/2013   LDLCALC 76 12/18/2013   TRIG 111.0 12/18/2013   Lab Results  Component Value Date   DDIMER 2.23* 01/18/2014    Radiology/Studies  Dg Chest 2 View  01/17/2014   CLINICAL DATA:  Shortness of breath.  EXAM: CHEST  2 VIEW  COMPARISON:  12/07/2013  FINDINGS: The heart is enlarged but stable.  Stable pacer wires/ AICD. Asymmetric airspace process in  the lungs could reflect asymmetric edema or infiltrates. No definite pleural effusions or pneumothorax.  IMPRESSION: Cardiac enlargement and asymmetric airspace process, favor infiltrates over asymmetric edema.   Electronically Signed   By: Kalman Jewels M.D.   On: 01/17/2014 23:26    ECG  Atrial and ventricular paced rhythm with heart rate in the 70s  ASSESSMENT AND PLAN  1. acute respiratory insufficiency  - Although proBNP increased from last admission, patient's weight has been improving since his discharge currently around 170 which was down 8 pounds since his "dry weight" last discharge (178)  - no significant JVD on exam, pulm exam no significant rale, no peripheral edema  - possible etiology included pneumonia and PE esp with elevated d-dimder 2.23. May only have mild CHF, however weight reassuring  - agree with Abx, will follow up on result CT of chest  - would continue on 20mg  lasix for now  2. atrial fibrillation on chronic Coumadin 3. Hyperlipidemia 4. Hypothyroidism 5. history of combined systolic and diastolic heart failure 6. history of ischemic cardiomyopathy secondary to anterior infarct with baseline EF 25% 7. history of apical aneurysm  8. history of VT status post [employer] Jude biventricular ICD placement  - occasional slow wide complex rhythm on tele beyond paced rhythm, LBBB, continue to monitor  Signed, Almyra Deforest, PA-C 01/18/2014, 1:32 PM  Patient seen and examined with Almyra Deforest, PA-C. We discussed all aspects of the encounter. I agree with the assessment and plan as stated above.   I do not see any evidence of volume overload on exam. He is pending VQ to exclude PE but I think this is low likelihood as well. My suspicion is he probably has PNA. If VQ negative can get noncontrast chest CT to further evaluate if needed to help clinical decision making.   Billiejean Schimek,MD 3:26 PM

## 2014-01-18 NOTE — ED Notes (Signed)
Report from [facility] ICD interrogation gotten, pt had rapid HR only on the sinus no on the ventricles. Pt wasn't shot by ICD.

## 2014-01-18 NOTE — H&P (Signed)
PCP:   Sheela Stack, MD   Chief Complaint:  SOB/Cough  HPI: [name] is a 73 y/o male with history of cardiac arrest and resuscitation earlier this year. [name] Has had continued CP since discharge which he describes as soreness, but has been progressively improving. Yesterday, he was coming home from trip visiting son in [place] when began feeling short of breath.. He had a cough with scant hemoptysis as well, so came to the ER for evaluation.  He is on coumadin and reports INR 2.4 on last check. Denies fever or chills, but his CXR favors pneumonia over fluid overload. Noted to be hypoxic on RA at 88% on arrival. No home oxygen use. No unusual leg pain or swelling noted.    Review of Systems:  Review of Systems - SOB, hemoptysis and generalized fatigue.  No fever or chills noted, no insect bites or tick bites.  Denies N/V/D.  Otherwise negative on 10 point scale. Past Medical History: Past Medical History  Diagnosis Date  . CAD (coronary artery disease) 1999    Remote anterolater and apical MI, cath 2011 patent stents RCA/LCx  LAD [name]/o obstruction  . Cardiomyopathy, ischemic     EF 20-25%  echo 2012  . Chronic systolic congestive heart failure   . ICD (implantable cardiac defibrillator) in place     [name]  . 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 Abdominal aortic aneurysm 2009    Repaired with endovascular stent  . PVD (peripheral vascular disease)     Has occluded innominate vein  . Myocardial infarction   . Hypertension   . Ventricular tachycardia     recurrent slow VT at 100-110 bpm,  Rx mexilitene/sotalol  . Left bundle branch block   . Systolic CHF, chronic   . Arthritis   . Gynecomastia     2/2 spironolactone  . Atrial fibrillation    Past Surgical History  Procedure Laterality Date  . Icd  Feb 2012    Change out with LV lead placed with tunneling from the right  to the left side  . Endovascular stent insertion      for AAA  . Cardiac catheterization      x2   . Pacemaker insertion    . Ventricular ablation surgery      s/p prior EPS and ablation at [facility] 9/12, [facility] 11/12, and most recent [facility] 08/23/11  . Tonsillectomy      Medications: Prior to Admission medications   Medication Sig Start Date End Date Taking? Authorizing Provider  ALPRAZolam (XANAX) 0.25 MG tablet Take 0.125-0.25 mg by mouth 4 (four) times daily.   Yes Historical Provider, MD  Ascorbic Acid (VITAMIN C) 500 MG tablet Take 500 mg by mouth daily.     Yes Historical Provider, MD  carvedilol (COREG) 6.25 MG tablet Take 1 tablet (6.25 mg total) by mouth 2 (two) times daily with a meal. Take one tablet by mouth twice daily 12/08/13  Yes Rhonda G Barrett, PA-C  Docusate Calcium (STOOL SOFTENER PO) Take 1 capsule by mouth daily.   Yes Historical Provider, MD  furosemide (LASIX) 20 MG tablet Take 20 mg by mouth daily. 12/08/13  Yes Rhonda G Barrett, PA-C  levothyroxine (SYNTHROID, LEVOTHROID) 88 MCG tablet Take 88-132 mcg by mouth daily. Take 1.5 tablets on Friday and 1 tablet the rest of the week.   Yes Historical Provider, MD  loratadine (CLARITIN) 10 MG tablet Take 10 mg by mouth daily.  Yes Historical Provider, MD  losartan (COZAAR) 50 MG tablet Take 25 mg by mouth 2 (two) times daily.   Yes Historical Provider, MD  meclizine (ANTIVERT) 25 MG tablet Take 25 mg by mouth 3 (three) times daily.    Yes Historical Provider, MD  mexiletine (MEXITIL) 150 MG capsule Take 150 mg by mouth 3 (three) times daily.   Yes Historical Provider, MD  Multiple Vitamin (MULTIVITAMIN WITH MINERALS) TABS tablet Take 1 tablet by mouth daily.   Yes Historical Provider, MD  Omega-3 Fatty Acids (FISH OIL) 1000 MG CAPS Take 1,000 mg by mouth 2 (two) times daily.    Yes Historical Provider, MD  ranitidine (ZANTAC) 150 MG tablet Take 150 mg by mouth every evening.   Yes Historical Provider, MD  rosuvastatin (CRESTOR) 5 MG tablet Take 5 mg by mouth 2 (two) times a week. Every Tuesday and Saturday   Yes  Historical Provider, MD  sotalol (BETAPACE) 80 MG tablet Take 80 mg by mouth 2 (two) times daily.   Yes Historical Provider, MD  traMADol (ULTRAM) 50 MG tablet Take 1 tablet (50 mg total) by mouth every 12 (twelve) hours as needed for moderate pain. 12/18/13  Yes Darlin Coco, MD  warfarin (COUMADIN) 2.5 MG tablet Take 2.5-3.75 mg by mouth See admin instructions. Take one 2.5mg  tablet daily except every Friday take 1.5 tablets to equal 3.75mg .   Yes Historical Provider, MD    Allergies:   Allergies  Allergen Reactions  . Iohexol      Code: HIVES, Desc: hives [name]/ itching during cardiac cath '99, mult caths since [name]/ ? premeds, DR.G.HAYES REQUESTS 13 HR PRE MED//A.C.pt okay [name]/13 hr prep/mms, Onset Date: [id number]   . Prednisone Other (See Comments)    Hives    Social History:  reports that he quit smoking about 20 years ago. He quit smokeless tobacco use about 26 years ago. He reports that he drinks alcohol. He reports that he does not use illicit drugs.  Family History: Family History  Problem Relation Age of Onset  . Stroke Mother   . Heart attack Mother   . Heart disease Mother   . Hyperlipidemia Mother   . Hypertension Mother   . Heart attack Father   . Heart disease Father   . Hyperlipidemia Father   . Hypertension Father   . Heart disease Sister   . Hyperlipidemia Sister   . Hypertension Brother     Physical Exam: Filed Vitals:   01/17/14 2345 01/18/14 0000 01/18/14 0015 01/18/14 0030  BP: 133/59 120/59 119/68 118/64  Pulse: 70 73 71 76  Temp:      TempSrc:      Resp: 19 16 18 16   SpO2: 99% 95% 97% 95%   General appearance: alert, cooperative and appears stated age Head: Normocephalic, without obvious abnormality, atraumatic Eyes: conjunctivae/corneas clear. PERRL, EOM's intact.  Nose: Nares normal. Septum midline. Mucosa normal. No drainage or sinus tenderness. Throat: lips, mucosa, and tongue normal; teeth and gums normal Neck: no adenopathy, no carotid  bruit, no JVD and thyroid not enlarged, symmetric, no tenderness/mass/nodules Resp: diminished breath sounds bibasilar Cardio: regular rate and rhythm, paced per EKG GI: soft, non-tender; bowel sounds normal; no masses,  no organomegaly Extremities: extremities normal, atraumatic, no cyanosis or edema Pulses: 2+ and symmetric Lymph nodes: Cervical adenopathy: no cervical lymphadenopathy Neurologic: Alert and oriented X 3, normal strength and tone. Normal symmetric reflexes.     Labs on Admission:   Recent Labs  01/17/14 2153  NA 137  K 3.9  CL 101  CO2 23  GLUCOSE 112*  BUN 13  CREATININE 1.06  CALCIUM 9.3    Recent Labs  01/17/14 2153  WBC 8.8  NEUTROABS 6.6  HGB 14.0  HCT 41.4  MCV 94.5  PLT 227   Pro BNP  3228 No d-dimer performed by ER.  Recent Labs  01/17/14 2153  TROPONINI <0.30   Lab Results  Component Value Date   INR 1.92* 01/17/2014   INR 2.4 01/03/2014   INR 1.7 12/18/2013   Radiological Exams on Admission: Dg Chest 2 View  01/17/2014   CLINICAL DATA:  Shortness of breath.  EXAM: CHEST  2 VIEW  COMPARISON:  12/07/2013  FINDINGS: The heart is enlarged but stable. Stable pacer wires/ AICD. Asymmetric airspace process in the lungs could reflect asymmetric edema or infiltrates. No definite pleural effusions or pneumothorax.  IMPRESSION: Cardiac enlargement and asymmetric airspace process, favor infiltrates over asymmetric edema.   Electronically Signed   By: Kalman Jewels M.D.   On: 01/17/2014 23:26   Orders placed during the hospital encounter of 01/17/14  . ED EKG  . ED EKG    Assessment/Plan Active Problems:   Pneumonia Vancomycin/Zosyn per pharmacy consult.  F/u CXR tomorrow.  Oxygen, nebs as needed.  Has had LE doppler prior to trip with varicose veins, but no eval for PE done.  Will order d-dimer and consider further eval if positive.  His INR is 1.9 at admission. CAD:  Does not appear to be a cardiac related SOB.  Dr. Radford Pax was curbsided by  EDP prior to calling IM service and was "unimpressed" despite ProBNP >3000.  Will continue home meds and consider Cards consult in the AM. VT:  S/P ablation/antiaarhythmics. Coumadin per pharmacy consult GERD:  Continue Zantac Constipation:  Stool softeners, PRN Miralax. Hypothyroid:  Continue Synthroid on current dose.  Seen for his APE last month with no changes.  [name],[name] 01/18/2014, 12:43 AM

## 2014-01-18 NOTE — Progress Notes (Signed)
ANTICOAGULATION CONSULT NOTE - Follow Up Consult  Pharmacy Consult for Coumadin Indication: atrial fibrillation  Allergies  Allergen Reactions  . Iohexol      Code: HIVES, Desc: hives w/ itching during cardiac cath '99, mult caths since w/ ? premeds, DR.G.HAYES REQUESTS 13 HR PRE MED//A.C.pt okay w/13 hr prep/mms, Onset Date: [id number]   . Prednisone Other (See Comments)    Hives    Patient Measurements: Height: 5\' 8"  (172.7 cm) Weight: 170 lb 3.1 oz (77.2 kg) IBW/kg (Calculated) : 68.4  Vital Signs: Temp: 98.1 F (36.7 C) (08/21 0610) Temp src: Oral (08/21 0610) BP: 120/60 mmHg (08/21 0610) Pulse Rate: 70 (08/21 0610)  Labs:  Recent Labs  01/17/14 2153 01/18/14 1225  HGB 14.0  --   HCT 41.4  --   PLT 227  --   LABPROT 22.0* 23.0*  INR 1.92* 2.04*  CREATININE 1.06  --   TROPONINI <0.30  --     Estimated Creatinine Clearance: 60.9 ml/min (by C-G formula based on Cr of 1.06).  Assessment:   INR is now low therapeutic (2.04) after total of 4.5 mg of Coumadin yesterday (2.5 mg at home, extra 2 mg overnight).  Home regimen: 2.5 mg daily except 3.75 mg on Fridays.  Chest CT changed to VQ scan due to dye allergy.  Goal of Therapy:  INR 2-3 Monitor platelets by anticoagulation protocol: Yes   Plan:   Usual Friday Coumadin dose of 3.75 mg today.  Daily PT/INR.  Will follow up VQ results.  Arty Baumgartner, [place] Pager: [phone] 01/18/2014,3:21 PM

## 2014-01-18 NOTE — Care Management Note (Unsigned)
    Page 1 of 1   01/18/2014     2:38:08 PM CARE MANAGEMENT NOTE 01/18/2014  Patient:  [name], [name]   Account Number:  [id number]  Date Initiated:  01/18/2014  Documentation initiated by:  Thales Knipple  Subjective/Objective Assessment:   Pt adm on 01/17/14 with PNA, hypoxia.  PTA, pt independent, lives with spouse.     Action/Plan:   Will follow for dc needs as pt progresses.   Anticipated DC Date:  01/21/2014   Anticipated DC Plan:  [place]  CM consult      Choice offered to / List presented to:             Status of service:  In process, will continue to follow Medicare Important Message given?   (If response is "NO", the following Medicare IM given date fields will be blank) Date Medicare IM given:   Medicare IM given by:   Date Additional Medicare IM given:   Additional Medicare IM given by:    Discharge Disposition:    Per UR Regulation:  Reviewed for med. necessity/level of care/duration of stay  If discussed at [place] of Stay Meetings, dates discussed:    Comments:

## 2014-01-18 NOTE — Progress Notes (Signed)
Subjective: Slept a little. Some dyspnea. No pleuritic CP.  Ate fair at breakfast  Objective: Vital signs in last 24 hours: Temp:  [98.1 F (36.7 C)-98.4 F (36.9 C)] 98.1 F (36.7 C) (08/21 0610) Pulse Rate:  [70-76] 70 (08/21 0610) Resp:  [13-21] 16 (08/21 0610) BP: (115-133)/(54-74) 120/60 mmHg (08/21 0610) SpO2:  [88 %-100 %] 100 % (08/21 0610) Weight:  [77.2 kg (170 lb 3.1 oz)] 77.2 kg (170 lb 3.1 oz) (08/21 0139)  Intake/Output from previous day: 08/20 0701 - 08/21 0700 In: -  Out: 950 [Urine:950] Intake/Output this shift:    Sitting up O2 in place. Non toxic. Lungs some right rhonchi. No rub. Ht regular with murmur. abd Soft NT extrems no edema. Awake, mentating well  Lab Results   Recent Labs  01/17/14 2153  WBC 8.8  RBC 4.38  HGB 14.0  HCT 41.4  MCV 94.5  MCH 32.0  RDW 12.8  PLT 227    Recent Labs  01/17/14 2153  NA 137  K 3.9  CL 101  CO2 23  GLUCOSE 112*  BUN 13  CREATININE 1.06  CALCIUM 9.3    Studies/Results: Dg Chest 2 View  01/17/2014   CLINICAL DATA:  Shortness of breath.  EXAM: CHEST  2 VIEW  COMPARISON:  12/07/2013  FINDINGS: The heart is enlarged but stable. Stable pacer wires/ AICD. Asymmetric airspace process in the lungs could reflect asymmetric edema or infiltrates. No definite pleural effusions or pneumothorax.  IMPRESSION: Cardiac enlargement and asymmetric airspace process, favor infiltrates over asymmetric edema.   Electronically Signed   By: Kalman Jewels M.D.   On: 01/17/2014 23:26    Scheduled Meds: . ALPRAZolam  0.125-0.25 mg Oral TID AC & HS  . atorvastatin  10 mg Oral q1800  . carvedilol  6.25 mg Oral BID WC  . docusate sodium  100 mg Oral Daily  . famotidine  20 mg Oral Daily  . furosemide  20 mg Oral Daily  . [START ON 01/19/2014] levothyroxine  88 mcg Oral Once per day on Sun Mon Tue Wed Thu Sat   And  . levothyroxine  132 mcg Oral Q Fri  . loratadine  10 mg Oral Daily  . losartan  25 mg Oral BID  . meclizine   25 mg Oral TID  . mexiletine  150 mg Oral 3 times per day  . multivitamin with minerals  1 tablet Oral Daily  . omega-3 acid ethyl esters  2 g Oral BID  . piperacillin-tazobactam (ZOSYN)  IV  3.375 g Intravenous 3 times per day  . pneumococcal 23 valent vaccine  0.5 mL Intramuscular Once  . sodium chloride  3 mL Intravenous Q12H  . sotalol  80 mg Oral BID  . vancomycin  1,000 mg Intravenous Q12H  . vitamin C  500 mg Oral Daily  . Warfarin - Pharmacist Dosing Inpatient   Does not apply q1800   Continuous Infusions:  PRN Meds:sodium chloride, acetaminophen, acetaminophen, polyethylene glycol, sodium chloride, traMADol, zolpidem  Assessment/Plan: PNEUMONIA: :"IMPRESSION:  Cardiac enlargement and asymmetric airspace process, favor infiltrates over asymmetric edema Acute onset without fever and no leukocytosis make this a less clear primary dx. On broad spectrum abx DYSPNEA/ELEVATED D-DIMER: check CTA, INR was a bit subtherapeutic CAD/ELEVATED BNP/ARRHYTHMIA: will ask cards to see. CMP: In July "Left ventricle: The cavity size was mildly dilated. Wall thickness was increased in a pattern of mild LVH. Systolic function was severely reduced. The estimated ejection fraction was in the range  of 20% to 25%. On Rx HYPOTHYROID : on Rx ANTICOAG: repeat pending  LOS: 1 day   [name] ALAN 01/18/2014, 8:41 AM

## 2014-01-18 NOTE — Progress Notes (Signed)
D-dimer result elevated, result reported to Dr. Osborne Casco, no new orders at this time.

## 2014-01-19 ENCOUNTER — Inpatient Hospital Stay (HOSPITAL_COMMUNITY): Payer: Medicare Other

## 2014-01-19 DIAGNOSIS — R0602 Shortness of breath: Secondary | ICD-10-CM

## 2014-01-19 DIAGNOSIS — I5023 Acute on chronic systolic (congestive) heart failure: Secondary | ICD-10-CM

## 2014-01-19 LAB — COMPREHENSIVE METABOLIC PANEL
ALT: 11 U/L (ref 0–53)
AST: 15 U/L (ref 0–37)
Albumin: 3.1 g/dL — ABNORMAL LOW (ref 3.5–5.2)
Alkaline Phosphatase: 54 U/L (ref 39–117)
Anion gap: 12 (ref 5–15)
BUN: 14 mg/dL (ref 6–23)
CO2: 26 mEq/L (ref 19–32)
Calcium: 9.1 mg/dL (ref 8.4–10.5)
Chloride: 100 mEq/L (ref 96–112)
Creatinine, Ser: 1.18 mg/dL (ref 0.50–1.35)
GFR calc non Af Amer: 60 mL/min — ABNORMAL LOW (ref >90)
GFR, EST AFRICAN AMERICAN: 69 mL/min — AB (ref >90)
GLUCOSE: 101 mg/dL — AB (ref 70–99)
POTASSIUM: 4.1 meq/L (ref 3.7–5.3)
Sodium: 138 mEq/L (ref 137–147)
TOTAL PROTEIN: 6.3 g/dL (ref 6.0–8.3)
Total Bilirubin: 0.7 mg/dL (ref 0.3–1.2)

## 2014-01-19 LAB — PROTIME-INR
INR: 2.77 — AB (ref 0.00–1.49)
Prothrombin Time: 29.3 seconds — ABNORMAL HIGH (ref 11.6–15.2)

## 2014-01-19 MED ORDER — FUROSEMIDE 10 MG/ML IJ SOLN
20.0000 mg | Freq: Two times a day (BID) | INTRAMUSCULAR | Status: DC
  Administered 2014-01-19: 20 mg via INTRAVENOUS
  Filled 2014-01-19 (×2): qty 2

## 2014-01-19 MED ORDER — SACCHAROMYCES BOULARDII 250 MG PO CAPS
250.0000 mg | ORAL_CAPSULE | Freq: Two times a day (BID) | ORAL | Status: DC
  Administered 2014-01-19 – 2014-01-20 (×3): 250 mg via ORAL
  Filled 2014-01-19 (×4): qty 1

## 2014-01-19 MED ORDER — WARFARIN SODIUM 2.5 MG PO TABS
2.5000 mg | ORAL_TABLET | Freq: Once | ORAL | Status: AC
  Administered 2014-01-19: 2.5 mg via ORAL
  Filled 2014-01-19: qty 1

## 2014-01-19 NOTE — Progress Notes (Signed)
Patient ID: [name], male   DOB: 16-Jul-1940, 73 y.o.   MRN: [id number] Subjective: Still with some coughing no chest pain   Objective: Vital signs in last 24 hours: Temp:  [98.3 F (36.8 C)-98.4 F (36.9 C)] 98.3 F (36.8 C) (08/22 0502) Pulse Rate:  [70-74] 74 (08/22 0502) Resp:  [18-19] 18 (08/22 0502) BP: (105-116)/(63-68) 116/65 mmHg (08/22 0502) SpO2:  [100 %] 100 % (08/22 0502) Weight:  [171 lb (77.565 kg)] 171 lb (77.565 kg) (08/22 0502) Weight change: 12.9 oz (0.365 kg) Last BM Date: 01/19/14  Intake/Output from previous day: 08/21 0701 - 08/22 0700 In: -  Out: 325 [Urine:325] Intake/Output this shift:    General appearance: alert, cooperative and appears stated age Resp: basilar crackles  Cardio: regular rate and rhythm GI: soft, non-tender; bowel sounds normal; no masses,  no organomegaly Extremities: extremities normal, atraumatic, no cyanosis or edema Neurologic: Grossly normal   Lab Results:  Recent Labs  01/17/14 2153  WBC 8.8  HGB 14.0  HCT 41.4  PLT 227   BMET  Recent Labs  01/17/14 2153 01/19/14 0411  NA 137 138  K 3.9 4.1  CL 101 100  CO2 23 26  GLUCOSE 112* 101*  BUN 13 14  CREATININE 1.06 1.18  CALCIUM 9.3 9.1   CMET CMP     Component Value Date/Time   NA 138 01/19/2014 0411   K 4.1 01/19/2014 0411   CL 100 01/19/2014 0411   CO2 26 01/19/2014 0411   GLUCOSE 101* 01/19/2014 0411   BUN 14 01/19/2014 0411   CREATININE 1.18 01/19/2014 0411   CALCIUM 9.1 01/19/2014 0411   PROT 6.3 01/19/2014 0411   ALBUMIN 3.1* 01/19/2014 0411   AST 15 01/19/2014 0411   ALT 11 01/19/2014 0411   ALKPHOS 54 01/19/2014 0411   BILITOT 0.7 01/19/2014 0411   GFRNONAA 60* 01/19/2014 0411   GFRAA 69* 01/19/2014 0411     Studies/Results: Dg Chest 2 View  01/17/2014   CLINICAL DATA:  Shortness of breath.  EXAM: CHEST  2 VIEW  COMPARISON:  12/07/2013  FINDINGS: The heart is enlarged but stable. Stable pacer wires/ AICD. Asymmetric airspace process in  the lungs could reflect asymmetric edema or infiltrates. No definite pleural effusions or pneumothorax.  IMPRESSION: Cardiac enlargement and asymmetric airspace process, favor infiltrates over asymmetric edema.   Electronically Signed   By: Kalman Jewels M.D.   On: 01/17/2014 23:26   Nm Pulmonary Perf And Vent  01/18/2014   CLINICAL DATA:  Elevated D-dimer, contrast allergy  EXAM: NUCLEAR MEDICINE VENTILATION - PERFUSION LUNG SCAN  TECHNIQUE: Ventilation images were obtained in multiple projections using inhaled aerosol technetium 99 M DTPA. Perfusion images were obtained in multiple projections after intravenous injection of Tc-44m MAA.  RADIOPHARMACEUTICALS:  40 MCi Tc-33m DTPA aerosol and 6 mCi Tc-58m MAA  COMPARISON:  PA and lateral chest x-ray of today's date  FINDINGS: Ventilation: No focal ventilation defect.  Perfusion: No wedge shaped peripheral perfusion defects to suggest acute pulmonary embolism.  IMPRESSION: Low probability for acute pulmonary embolism.   Electronically Signed   By: [name]  Martinique   On: 01/18/2014 16:35    Medications: I have reviewed the patient's current medications. . ALPRAZolam  0.125-0.25 mg Oral TID AC & HS  . atorvastatin  10 mg Oral q1800  . carvedilol  6.25 mg Oral BID WC  . docusate sodium  100 mg Oral Daily  . famotidine  20 mg Oral Daily  . furosemide  20 mg Oral Daily  . levothyroxine  88 mcg Oral Once per day on Sun Mon Tue Wed Thu Sat   And  . levothyroxine  132 mcg Oral Q Fri  . loratadine  10 mg Oral Daily  . losartan  25 mg Oral BID  . meclizine  25 mg Oral TID  . mexiletine  150 mg Oral 3 times per day  . multivitamin with minerals  1 tablet Oral Daily  . omega-3 acid ethyl esters  2 g Oral BID  . piperacillin-tazobactam (ZOSYN)  IV  3.375 g Intravenous 3 times per day  . pneumococcal 23 valent vaccine  0.5 mL Intramuscular Once  . saccharomyces boulardii  250 mg Oral BID  . sodium chloride  3 mL Intravenous Q12H  . sotalol  80 mg Oral BID   . vancomycin  1,000 mg Intravenous Q12H  . vitamin C  500 mg Oral Daily  . Warfarin - Pharmacist Dosing Inpatient   Does not apply q1800     Assessment/Plan:  DCM:  Euvolemic give one day of iv bid lasix given elevated BNP    See note by Dr Jeffie Pollock   Pneumonia:  Low prob V/Q  On zosyn per primary f/u CXR   PAF:  On coumadin and mexiletine and sotolol  AV pacing stable    LOS: 2 days   Jenkins Rouge, MD 01/19/2014, 10:06 AM

## 2014-01-19 NOTE — Progress Notes (Signed)
ANTICOAGULATION CONSULT NOTE - Follow Up Consult  Pharmacy Consult for Coumadin Indication: atrial fibrillation  Allergies  Allergen Reactions  . Iohexol      Code: HIVES, Desc: hives w/ itching during cardiac cath '99, mult caths since w/ ? premeds, DR.G.HAYES REQUESTS 13 HR PRE MED//A.C.pt okay w/13 hr prep/mms, Onset Date: [id number]   . Prednisone Other (See Comments)    Hives    Patient Measurements: Height: 5\' 8"  (172.7 cm) Weight: 171 lb (77.565 kg) IBW/kg (Calculated) : 68.4  Vital Signs: Temp: 98.3 F (36.8 C) (08/22 0502) Temp src: Oral (08/22 0502) BP: 116/65 mmHg (08/22 0502) Pulse Rate: 74 (08/22 0502)  Labs:  Recent Labs  01/17/14 2153 01/18/14 1225 01/19/14 0411  HGB 14.0  --   --   HCT 41.4  --   --   PLT 227  --   --   LABPROT 22.0* 23.0* 29.3*  INR 1.92* 2.04* 2.77*  CREATININE 1.06  --  1.18  TROPONINI <0.30  --   --     Estimated Creatinine Clearance: 54.7 ml/min (by C-G formula based on Cr of 1.18).  Assessment: 73yom continues on coumadin for afib. INR is therapeutic. No bleeding reported. Noted VQ scan with low probability for PE.  Home dose: 2.5mg  daily except 3.75mg  on Fridays  Goal of Therapy:  INR 2-3 Monitor platelets by anticoagulation protocol: Yes   Plan:  1) Coumadin 2.5mg  x 1 per home regimen 2) INR in AM  Deboraha Sprang 01/19/2014,12:50 PM

## 2014-01-19 NOTE — Progress Notes (Signed)
Bilateral lower extremity venous duplex completed:  No evidence of DVT, superficial thrombosis, or Baker's cyst.   

## 2014-01-19 NOTE — Progress Notes (Signed)
Subjective: He feels a bit better today. Some productive cough and some mild hemoptysis. No wheeze, no sig. Sob.  Objective: Vital signs in last 24 hours: Temp:  [98.3 F (36.8 C)-98.4 F (36.9 C)] 98.3 F (36.8 C) (08/22 0502) Pulse Rate:  [70-74] 74 (08/22 0502) Resp:  [18-19] 18 (08/22 0502) BP: (105-116)/(63-68) 116/65 mmHg (08/22 0502) SpO2:  [100 %] 100 % (08/22 0502) Weight:  [77.565 kg (171 lb)] 77.565 kg (171 lb) (08/22 0502) Weight change: 0.365 kg (12.9 oz) Last BM Date: 01/19/14  Intake/Output from previous day: 08/21 0701 - 08/22 0700 In: -  Out: 325 [Urine:325] Intake/Output this shift:    General appearance: alert, cooperative and appears stated age Resp: clear to auscultation bilaterally Cardio: regular rate and rhythm GI: soft, non-tender; bowel sounds normal; no masses,  no organomegaly Extremities: extremities normal, atraumatic, no cyanosis or edema Neurologic: Grossly normal   Lab Results:  Recent Labs  01/17/14 2153  WBC 8.8  HGB 14.0  HCT 41.4  PLT 227   BMET  Recent Labs  01/17/14 2153 01/19/14 0411  NA 137 138  K 3.9 4.1  CL 101 100  CO2 23 26  GLUCOSE 112* 101*  BUN 13 14  CREATININE 1.06 1.18  CALCIUM 9.3 9.1   CMET CMP     Component Value Date/Time   NA 138 01/19/2014 0411   K 4.1 01/19/2014 0411   CL 100 01/19/2014 0411   CO2 26 01/19/2014 0411   GLUCOSE 101* 01/19/2014 0411   BUN 14 01/19/2014 0411   CREATININE 1.18 01/19/2014 0411   CALCIUM 9.1 01/19/2014 0411   PROT 6.3 01/19/2014 0411   ALBUMIN 3.1* 01/19/2014 0411   AST 15 01/19/2014 0411   ALT 11 01/19/2014 0411   ALKPHOS 54 01/19/2014 0411   BILITOT 0.7 01/19/2014 0411   GFRNONAA 60* 01/19/2014 0411   GFRAA 69* 01/19/2014 0411     Studies/Results: Dg Chest 2 View  01/17/2014   CLINICAL DATA:  Shortness of breath.  EXAM: CHEST  2 VIEW  COMPARISON:  12/07/2013  FINDINGS: The heart is enlarged but stable. Stable pacer wires/ AICD. Asymmetric airspace process in the  lungs could reflect asymmetric edema or infiltrates. No definite pleural effusions or pneumothorax.  IMPRESSION: Cardiac enlargement and asymmetric airspace process, favor infiltrates over asymmetric edema.   Electronically Signed   By: Kalman Jewels M.D.   On: 01/17/2014 23:26   Nm Pulmonary Perf And Vent  01/18/2014   CLINICAL DATA:  Elevated D-dimer, contrast allergy  EXAM: NUCLEAR MEDICINE VENTILATION - PERFUSION LUNG SCAN  TECHNIQUE: Ventilation images were obtained in multiple projections using inhaled aerosol technetium 99 M DTPA. Perfusion images were obtained in multiple projections after intravenous injection of Tc-25m MAA.  RADIOPHARMACEUTICALS:  40 MCi Tc-43m DTPA aerosol and 6 mCi Tc-1m MAA  COMPARISON:  PA and lateral chest x-ray of today's date  FINDINGS: Ventilation: No focal ventilation defect.  Perfusion: No wedge shaped peripheral perfusion defects to suggest acute pulmonary embolism.  IMPRESSION: Low probability for acute pulmonary embolism.   Electronically Signed   By: [name]  Martinique   On: 01/18/2014 16:35    Medications: I have reviewed the patient's current medications. . ALPRAZolam  0.125-0.25 mg Oral TID AC & HS  . atorvastatin  10 mg Oral q1800  . carvedilol  6.25 mg Oral BID WC  . docusate sodium  100 mg Oral Daily  . famotidine  20 mg Oral Daily  . furosemide  20 mg Oral Daily  .  levothyroxine  88 mcg Oral Once per day on Sun Mon Tue Wed Thu Sat   And  . levothyroxine  132 mcg Oral Q Fri  . loratadine  10 mg Oral Daily  . losartan  25 mg Oral BID  . meclizine  25 mg Oral TID  . mexiletine  150 mg Oral 3 times per day  . multivitamin with minerals  1 tablet Oral Daily  . omega-3 acid ethyl esters  2 g Oral BID  . piperacillin-tazobactam (ZOSYN)  IV  3.375 g Intravenous 3 times per day  . pneumococcal 23 valent vaccine  0.5 mL Intramuscular Once  . sodium chloride  3 mL Intravenous Q12H  . sotalol  80 mg Oral BID  . vancomycin  1,000 mg Intravenous Q12H  .  vitamin C  500 mg Oral Daily  . Warfarin - Pharmacist Dosing Inpatient   Does not apply q1800     Assessment/Plan:  Active Problems:   Pneumonia-V/Q low prob.   And, inr is therapeutic which lowers chance of acute thromboembolic disease.  A lower ext. Venous doppler should still be pending, not done yet.   He is on vanco and zosyn to cover empirically for pna.  No other true cause of sxs found other than possible pna  (does not appear to be in decompensated chf now).  We will cont. With the iv abx today.  i will check pa/lat xray chest tomorrow.  If stable possible d/c tomorrow on a course of oral abx.   Appreciate cards consult.   LOS: 2 days   Jerlyn Ly, MD 01/19/2014, 9:48 AM

## 2014-01-20 ENCOUNTER — Inpatient Hospital Stay (HOSPITAL_COMMUNITY): Payer: Medicare Other

## 2014-01-20 DIAGNOSIS — I2589 Other forms of chronic ischemic heart disease: Secondary | ICD-10-CM

## 2014-01-20 DIAGNOSIS — Z95810 Presence of automatic (implantable) cardiac defibrillator: Secondary | ICD-10-CM

## 2014-01-20 LAB — COMPREHENSIVE METABOLIC PANEL
ALT: 15 U/L (ref 0–53)
AST: 19 U/L (ref 0–37)
Albumin: 3.2 g/dL — ABNORMAL LOW (ref 3.5–5.2)
Alkaline Phosphatase: 53 U/L (ref 39–117)
Anion gap: 11 (ref 5–15)
BILIRUBIN TOTAL: 0.5 mg/dL (ref 0.3–1.2)
BUN: 13 mg/dL (ref 6–23)
CO2: 28 meq/L (ref 19–32)
Calcium: 9.1 mg/dL (ref 8.4–10.5)
Chloride: 100 mEq/L (ref 96–112)
Creatinine, Ser: 1.18 mg/dL (ref 0.50–1.35)
GFR calc Af Amer: 69 mL/min — ABNORMAL LOW (ref >90)
GFR, EST NON AFRICAN AMERICAN: 60 mL/min — AB (ref >90)
Glucose, Bld: 110 mg/dL — ABNORMAL HIGH (ref 70–99)
Potassium: 3.9 mEq/L (ref 3.7–5.3)
SODIUM: 139 meq/L (ref 137–147)
Total Protein: 6.6 g/dL (ref 6.0–8.3)

## 2014-01-20 LAB — CBC WITH DIFFERENTIAL/PLATELET
Basophils Absolute: 0.1 10*3/uL (ref 0.0–0.1)
Basophils Relative: 1 % (ref 0–1)
Eosinophils Absolute: 0.2 10*3/uL (ref 0.0–0.7)
Eosinophils Relative: 3 % (ref 0–5)
HCT: 38.2 % — ABNORMAL LOW (ref 39.0–52.0)
Hemoglobin: 13 g/dL (ref 13.0–17.0)
LYMPHS PCT: 26 % (ref 12–46)
Lymphs Abs: 2 10*3/uL (ref 0.7–4.0)
MCH: 31.6 pg (ref 26.0–34.0)
MCHC: 34 g/dL (ref 30.0–36.0)
MCV: 92.9 fL (ref 78.0–100.0)
MONOS PCT: 16 % — AB (ref 3–12)
Monocytes Absolute: 1.2 10*3/uL — ABNORMAL HIGH (ref 0.1–1.0)
NEUTROS PCT: 54 % (ref 43–77)
Neutro Abs: 4.2 10*3/uL (ref 1.7–7.7)
PLATELETS: 216 10*3/uL (ref 150–400)
RBC: 4.11 MIL/uL — AB (ref 4.22–5.81)
RDW: 12.9 % (ref 11.5–15.5)
WBC: 7.6 10*3/uL (ref 4.0–10.5)

## 2014-01-20 LAB — PROTIME-INR
INR: 2.87 — ABNORMAL HIGH (ref 0.00–1.49)
PROTHROMBIN TIME: 30.1 s — AB (ref 11.6–15.2)

## 2014-01-20 MED ORDER — FUROSEMIDE 20 MG PO TABS: 20.0000 mg | ORAL_TABLET | Freq: Every day | ORAL | Status: DC

## 2014-01-20 MED ORDER — AZITHROMYCIN 500 MG PO TABS: 500.0000 mg | ORAL_TABLET | Freq: Every day | ORAL | Status: DC

## 2014-01-20 MED ORDER — FUROSEMIDE 20 MG PO TABS
20.0000 mg | ORAL_TABLET | Freq: Once | ORAL | Status: DC
  Filled 2014-01-20: qty 1

## 2014-01-20 NOTE — Discharge Summary (Addendum)
Physician Discharge Summary  Patient ID: [name] MRN: [id number] DOB/AGE: 1941-03-03 73 y.o.  Admit date: 01/17/2014 Discharge date: 01/20/2014  Admission Diagnoses: sob  Discharge Diagnoses:  Active Problems:  Bronchitis suspected Acute exacerbation of CHF Ischemic Cardiomyopathy Patient Active Problem List   Diagnosis Date Noted  .    [name] Varicose veins of lower extremities with other complications 00/93/8182  . Cardiac arrest 12/05/2013  . SVT (supraventricular tachycardia) 12/05/2013  . Acute respiratory failure 12/05/2013  . Hyperkalemia 12/05/2013  .    [name] Acute on chronic systolic heart failure 99/37/1696  . Encounter for therapeutic drug monitoring 10/10/2013  . Dyslipidemia 02/15/2013  . Coronary artery disease 04/08/2011  . Anxiety 04/08/2011  . HTN (hypertension) 02/02/2011  . Biventricular implantable cardioverter-defibrillator in situ 11/03/2010  . Sinus bradycardia 11/03/2010  . Chronic anticoagulation 10/05/2010  . Cardiomyopathy, ischemic   . Chronic systolic heart failure   . Ventricular tachycardia   . LBBB 05/21/2010  . 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 ABDOMINAL AORTIC ANEURYSM 02/07/2009      Discharged Condition: improved, stable.  Hospital Course: [name] is a pleasant 73 yo gentleman with significant cardiac history.  He had a recent admission for cardiac arrest (possibly due to acute chf from SVT).   He was doing better and actually had travelled to [place]. .   Upon return he had worse sob and presented to the ER.  He was mildly hypoxic and chest xray suggested pneumonia vs. CHF.   He was admitted.  He was empirically placed on Zosyn and Vancomycin. He was diuresed with 20 mg IV lasix BID.  He did have some mild productive cough but never had fever or increased wbc.   He improved rapidly and xray chest was actually clear by 01/20/14 suggesting that this may have actually been more pulmonary edema again this admission.   On 01/20/14 he was  discharged home in stable condition to complete a course of oral antibiotics and his outpatient lasix dose was slightly increased upon discharge as well. While in the hospital he had an elevated D-Dimer and a V/Q scan which was Low probability for P.E..  His INR was therapeutic during his stay.  He also had a negative bilateral LE venous doppler study during his admission.  Consults: cardiology  Significant Diagnostic Studies: see above text  Treatments: antibiotics and diuresis  Discharge Exam:  Blood pressure 116/66, pulse 74, temperature 98.3 F (36.8 C), temperature source Oral, resp. rate 18, height 5\' 8"  (1.727 m), weight 77.565 kg (171 lb), SpO2 93.00%.  Physical Exam:   Age appropriate. Sitting in chair. No acute distress.   Lungs with slightly bronchial bs bilaterally but no w/r/r.  Heart is RRR no m/r/g. abd soft,nt.   No sig. Edema.  Moe times 4 with grossly nL strength.  Neurologically grossly intact.  Disposition:discharge to home     Medication List    ASK your doctor about these medications       ALPRAZolam 0.25 MG tablet  Commonly known as:  XANAX  Take 0.125-0.25 mg by mouth 4 (four) times daily.     carvedilol 6.25 MG tablet  Commonly known as:  COREG  Take 1 tablet (6.25 mg total) by mouth 2 (two) times daily with a meal. Take one tablet by mouth twice daily     Fish Oil 1000 MG Caps  Take 1,000 mg by mouth 2 (two) times daily.     furosemide 20 MG tablet  Commonly known as:  LASIX  Take 20 mg by mouth daily in a.m.  And 1/2 of 20 mg tab (10 mg) each early afternoon.     levothyroxine 88 MCG tablet  Commonly known as:  SYNTHROID, LEVOTHROID  Take 88-132 mcg by mouth daily. Take 1.5 tablets on Friday and 1 tablet the rest of the week.     loratadine 10 MG tablet  Commonly known as:  CLARITIN  Take 10 mg by mouth daily.     losartan 50 MG tablet  Commonly known as:  COZAAR  Take 25 mg by mouth 2 (two) times daily.     meclizine 25 MG tablet   Commonly known as:  ANTIVERT  Take 25 mg by mouth 3 (three) times daily.     mexiletine 150 MG capsule  Commonly known as:  MEXITIL  Take 150 mg by mouth 3 (three) times daily.     multivitamin with minerals Tabs tablet  Take 1 tablet by mouth daily.     ranitidine 150 MG tablet  Commonly known as:  ZANTAC  Take 150 mg by mouth every evening.     rosuvastatin 5 MG tablet  Commonly known as:  CRESTOR  Take 5 mg by mouth 2 (two) times a week. Every Tuesday and Saturday     sotalol 80 MG tablet  Commonly known as:  BETAPACE  Take 80 mg by mouth 2 (two) times daily.     STOOL SOFTENER PO  Take 1 capsule by mouth daily.     traMADol 50 MG tablet  Commonly known as:  ULTRAM  Take 1 tablet (50 mg total) by mouth every 12 (twelve) hours as needed for moderate pain.     vitamin C 500 MG tablet  Commonly known as:  ASCORBIC ACID  Take 500 mg by mouth daily.     warfarin 2.5 MG tablet  Commonly known as:  COUMADIN  Take 2.5-3.75 mg by mouth See admin instructions. Take one 2.5mg  tablet daily except every Friday take 1.5 tablets to equal 3.75mg .      Take a probiotic:  Align. One daily for 2 weeks. Take azithromycin 500 mg one po daily for 3 days, then stop.  Keep visit tomorrow with dr. Mare Ferrari for Inr check and for him to get familiar with this hospital stay.     Lab Results  Component Value Date   INR 2.87* 01/20/2014   INR 2.77* 01/19/2014   INR 2.04* 01/18/2014   CBC Latest Ref Rng 01/20/2014 01/17/2014 12/18/2013  WBC 4.0 - 10.5 K/uL 7.6 8.8 6.8  Hemoglobin 13.0 - 17.0 g/dL 13.0 14.0 12.2(L)  Hematocrit 39.0 - 52.0 % 38.2(L) 41.4 37.0(L)  Platelets 150 - 400 K/uL 216 227 434.0(H)    Lab Results  Component Value Date   CREATININE 1.18 01/20/2014   BUN 13 01/20/2014   NA 139 01/20/2014   K 3.9 01/20/2014   CL 100 01/20/2014   CO2 28 01/20/2014     Signed: Hussein Macdougal A 01/20/2014, 10:29 AM

## 2014-01-20 NOTE — Progress Notes (Signed)
Patient ID: [name], male   DOB: [birth year], 73 y.o.   MRN: [id number] Subjective: No complaints Just coming back from Xray  Not needing oxygen and using IS regularly   Objective: Vital signs in last 24 hours: Temp:  [97.6 F (36.4 C)-98.3 F (36.8 C)] 98.3 F (36.8 C) (08/23 0450) Pulse Rate:  [70-77] 74 (08/23 0651) Resp:  [18] 18 (08/23 0450) BP: (95-116)/(53-69) 116/66 mmHg (08/23 0651) SpO2:  [93 %-100 %] 93 % (08/23 0450) Weight change:  Last BM Date: 01/19/14  Intake/Output from previous day: 08/22 0701 - 08/23 0700 In: 900 [P.O.:600; IV Piggyback:300] Out: 325 [Urine:325] Intake/Output this shift:    General appearance: alert, cooperative and appears stated age Resp: right basilar rhonchi Cardio: regular rate and rhythm GI: soft, non-tender; bowel sounds normal; no masses,  no organomegaly Extremities: extremities normal, atraumatic, no cyanosis or edema Neurologic: Grossly normal   Lab Results:  Recent Labs  01/17/14 2153 01/20/14 0436  WBC 8.8 7.6  HGB 14.0 13.0  HCT 41.4 38.2*  PLT 227 216   BMET  Recent Labs  01/19/14 0411 01/20/14 0436  NA 138 139  K 4.1 3.9  CL 100 100  CO2 26 28  GLUCOSE 101* 110*  BUN 14 13  CREATININE 1.18 1.18  CALCIUM 9.1 9.1   CMET CMP     Component Value Date/Time   NA 139 01/20/2014 0436   K 3.9 01/20/2014 0436   CL 100 01/20/2014 0436   CO2 28 01/20/2014 0436   GLUCOSE 110* 01/20/2014 0436   BUN 13 01/20/2014 0436   CREATININE 1.18 01/20/2014 0436   CALCIUM 9.1 01/20/2014 0436   PROT 6.6 01/20/2014 0436   ALBUMIN 3.2* 01/20/2014 0436   AST 19 01/20/2014 0436   ALT 15 01/20/2014 0436   ALKPHOS 53 01/20/2014 0436   BILITOT 0.5 01/20/2014 0436   GFRNONAA 60* 01/20/2014 0436   GFRAA 69* 01/20/2014 0436     Studies/Results: Dg Chest 2 View  01/20/2014   CLINICAL DATA:  Pneumonia with history of CHF. An atrial fibrillation  EXAM: CHEST  2 VIEW  COMPARISON:  PA and lateral chest of January 19, 2014  FINDINGS:  The lungs are well-expanded and clear. There is no pleural effusion. The mediastinum is normal in width. The cardiac silhouette is normal in size. The permanent pacemaker defibrillator is unchanged in position. The pulmonary vascularity is normal. There is no pleural effusion. The bony thorax is unremarkable.  IMPRESSION: There is no evidence of pneumonia nor CHF nor other active cardiopulmonary disease.   Electronically Signed   By: [name]  Martinique   On: 01/20/2014 09:55   X-ray Chest Pa And Lateral   01/19/2014   CLINICAL DATA:  Short of breath  EXAM: CHEST  2 VIEW  COMPARISON:  Prior chest x-ray 01/17/2014  FINDINGS: Bilateral subclavian approach biventricular cardiac rhythm maintenance device. Leads project over the right atrium, right ventricle on within a cardiac vein overlying the left ventricle. Stable cardiac and mediastinal contours. Resolving bilateral opacities. The lungs are relatively clear on the present examination. No pleural effusion or pneumothorax. No edema. No acute osseous abnormality.  IMPRESSION: 1. Interval resolution of asymmetric opacities compared 01/17/2014. 2. No acute cardiopulmonary process.   Electronically Signed   By: Jacqulynn Cadet M.D.   On: 01/19/2014 13:25   Nm Pulmonary Perf And Vent  01/18/2014   CLINICAL DATA:  Elevated D-dimer, contrast allergy  EXAM: NUCLEAR MEDICINE VENTILATION - PERFUSION LUNG SCAN  TECHNIQUE: Ventilation  images were obtained in multiple projections using inhaled aerosol technetium 99 M DTPA. Perfusion images were obtained in multiple projections after intravenous injection of Tc-24m MAA.  RADIOPHARMACEUTICALS:  40 MCi Tc-66m DTPA aerosol and 6 mCi Tc-35m MAA  COMPARISON:  PA and lateral chest x-ray of today's date  FINDINGS: Ventilation: No focal ventilation defect.  Perfusion: No wedge shaped peripheral perfusion defects to suggest acute pulmonary embolism.  IMPRESSION: Low probability for acute pulmonary embolism.   Electronically Signed   By:  [name]  Martinique   On: 01/18/2014 16:35    Medications: I have reviewed the patient's current medications. . ALPRAZolam  0.125-0.25 mg Oral TID AC & HS  . atorvastatin  10 mg Oral q1800  . carvedilol  6.25 mg Oral BID WC  . docusate sodium  100 mg Oral Daily  . famotidine  20 mg Oral Daily  . furosemide  20 mg Intravenous BID  . levothyroxine  88 mcg Oral Once per day on Sun Mon Tue Wed Thu Sat   And  . levothyroxine  132 mcg Oral Q Fri  . loratadine  10 mg Oral Daily  . losartan  25 mg Oral BID  . meclizine  25 mg Oral TID  . mexiletine  150 mg Oral 3 times per day  . multivitamin with minerals  1 tablet Oral Daily  . omega-3 acid ethyl esters  2 g Oral BID  . piperacillin-tazobactam (ZOSYN)  IV  3.375 g Intravenous 3 times per day  . pneumococcal 23 valent vaccine  0.5 mL Intramuscular Once  . saccharomyces boulardii  250 mg Oral BID  . sodium chloride  3 mL Intravenous Q12H  . sotalol  80 mg Oral BID  . vancomycin  1,000 mg Intravenous Q12H  . vitamin C  500 mg Oral Daily  . Warfarin - Pharmacist Dosing Inpatient   Does not apply q1800     Assessment/Plan:  DCM:  Euvolemic change back to normal home dose lasix    See note by Dr Jeffie Pollock   Pneumonia:  Low prob V/Q  On zosyn per primary CXR shows resolution of infiltrates  PAF:  On coumadin and mexiletine and sotolol  AV pacing stable   ? Discharge latter today per primary service He looks good   LOS: 3 days   Jenkins Rouge, MD 01/20/2014, 10:06 AM

## 2014-01-21 ENCOUNTER — Encounter: Payer: Self-pay | Admitting: Cardiology

## 2014-01-21 ENCOUNTER — Ambulatory Visit (INDEPENDENT_AMBULATORY_CARE_PROVIDER_SITE_OTHER): Payer: Medicare Other | Admitting: Cardiology

## 2014-01-21 ENCOUNTER — Other Ambulatory Visit: Payer: Self-pay | Admitting: Cardiology

## 2014-01-21 ENCOUNTER — Ambulatory Visit (INDEPENDENT_AMBULATORY_CARE_PROVIDER_SITE_OTHER): Payer: Medicare Other | Admitting: *Deleted

## 2014-01-21 VITALS — BP 126/68 | HR 72 | Ht 68.0 in | Wt 173.0 lb

## 2014-01-21 DIAGNOSIS — I255 Ischemic cardiomyopathy: Secondary | ICD-10-CM

## 2014-01-21 DIAGNOSIS — I5023 Acute on chronic systolic (congestive) heart failure: Secondary | ICD-10-CM

## 2014-01-21 DIAGNOSIS — I2589 Other forms of chronic ischemic heart disease: Secondary | ICD-10-CM

## 2014-01-21 DIAGNOSIS — Z79899 Other long term (current) drug therapy: Secondary | ICD-10-CM

## 2014-01-21 DIAGNOSIS — Z5181 Encounter for therapeutic drug level monitoring: Secondary | ICD-10-CM

## 2014-01-21 DIAGNOSIS — J189 Pneumonia, unspecified organism: Secondary | ICD-10-CM

## 2014-01-21 DIAGNOSIS — I5022 Chronic systolic (congestive) heart failure: Secondary | ICD-10-CM

## 2014-01-21 DIAGNOSIS — Z7901 Long term (current) use of anticoagulants: Secondary | ICD-10-CM

## 2014-01-21 DIAGNOSIS — I2510 Atherosclerotic heart disease of native coronary artery without angina pectoris: Secondary | ICD-10-CM

## 2014-01-21 DIAGNOSIS — I259 Chronic ischemic heart disease, unspecified: Secondary | ICD-10-CM

## 2014-01-21 LAB — BASIC METABOLIC PANEL
BUN: 14 mg/dL (ref 6–23)
CO2: 32 mEq/L (ref 19–32)
CREATININE: 1.3 mg/dL (ref 0.4–1.5)
Calcium: 9.3 mg/dL (ref 8.4–10.5)
Chloride: 98 mEq/L (ref 96–112)
GFR: 60.3 mL/min (ref >60.00)
GLUCOSE: 77 mg/dL (ref 70–99)
Potassium: 4.6 mEq/L (ref 3.5–5.1)
Sodium: 136 mEq/L (ref 135–145)

## 2014-01-21 LAB — POCT INR: INR: 2.8

## 2014-01-21 NOTE — Progress Notes (Signed)
Quick Note:  Please report to patient. The recent labs are stable. Continue same medication and careful diet. Potassium and renal function normal. ______

## 2014-01-21 NOTE — Progress Notes (Signed)
[name] Date of Birth:  02-22-1941 [place] [address] [place], [place]  [address] [phone]        Fax   [phone]   History of Present Illness:  This pleasant 73 year old gentleman is seen for a post hospital office visit.  He was admitted on 01/17/14 and discharged on 01/20/14.  He was admitted after he arrived home from an air plane flight from [place] where he had been on vacation for the previous week.  When the plane landed he had difficulty with increasing dyspnea and his wife became concerned because she could hear his breathing with a rattling noise.  This was similar to her to his last hospitalization and he was admitted with acute pulmonary edema and required brief CPR and closed chest massage.  At that time he was admitted on July 8 and discharged on July 11. He presented with acute pulmonary edema. He required brief CPR by EMS at his home. His wife describes frothy sputum at home which came on very quickly. He has a history of a remote anterior wall myocardial infarction. He has an ischemic cardiomyopathy. He has a left ventricular apical aneurysm. He has had a defibrillator for ventricular tachycardia. He has been followed closely by Dr. Caryl Comes and Dr. Rayann Heman. 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 Subsequently he had recurrent ventricular tachycardia and was transferred to [facility] for incessant VT and underwent a second ablation procedure complicated by pericardial perforation and pericarditis. He underwent a third catheter VT ablation procedure in March 2013 by Dr. Rayann Heman and since then has felt well. He has not been aware of any palpitations. He has not been aware of any shocks from his ICD. He does have a known ischemic cardiomyopathy with ejection fraction of 25-30% by echocardiogram in February 2012. In autumn 2014 he had some problems with fluid overload and [place]  and responded to increased diuretic and to dietary salt restriction. At the time of this prior admission his echocardiogram on 12/06/13 showed an ejection fraction of 20-25% and there was an apical aneurysm.  Current Outpatient Prescriptions  Medication Sig Dispense Refill  . ALPRAZolam (XANAX) 0.25 MG tablet Take 0.125-0.25 mg by mouth 4 (four) times daily.      . Ascorbic Acid (VITAMIN C) 500 MG tablet Take 500 mg by mouth daily.        [name] azithromycin (ZITHROMAX) 500 MG tablet Take 1 tablet (500 mg total) by mouth daily.  3 tablet  0  . carvedilol (COREG) 6.25 MG tablet Take 1 tablet (6.25 mg total) by mouth 2 (two) times daily with a meal. Take one tablet by mouth twice daily  60 tablet  3  . Docusate Calcium (STOOL SOFTENER PO) Take 1 capsule by mouth daily.      . furosemide (LASIX) 20 MG tablet Take 20 mg by mouth daily. TAKES 10MG  IN AFTERNOON.(CUTS 20 MG IN HALF)      . levothyroxine (SYNTHROID, LEVOTHROID) 88 MCG tablet Take 88-132 mcg by mouth daily. Take 1.5 tablets on Friday and 1 tablet the rest of the week.      . loratadine (CLARITIN) 10 MG tablet Take 10 mg by mouth daily.      [name] losartan (COZAAR) 50 MG tablet Take 25 mg by mouth 2 (two) times daily.      . meclizine (ANTIVERT) 25 MG tablet Take 25 mg by  mouth 3 (three) times daily.       [name] mexiletine (MEXITIL) 150 MG capsule Take 150 mg by mouth 3 (three) times daily.      . Multiple Vitamin (MULTIVITAMIN WITH MINERALS) TABS tablet Take 1 tablet by mouth daily.      . Omega-3 Fatty Acids (FISH OIL) 1000 MG CAPS Take 1,000 mg by mouth 2 (two) times daily.       . ranitidine (ZANTAC) 150 MG tablet Take 150 mg by mouth every evening.      . rosuvastatin (CRESTOR) 5 MG tablet Take 5 mg by mouth 2 (two) times a week. Every Tuesday and Saturday      . sotalol (BETAPACE) 80 MG tablet Take 80 mg by mouth 2 (two) times daily.      . traMADol (ULTRAM) 50 MG tablet Take 1 tablet (50 mg total) by mouth every 12 (twelve) hours as needed for  moderate pain.  60 tablet  0  . warfarin (COUMADIN) 2.5 MG tablet Take 2.5-3.75 mg by mouth See admin instructions. Take one 2.5mg  tablet daily except every Friday take 1.5 tablets to equal 3.75mg .       No current facility-administered medications for this visit.    Allergies  Allergen Reactions  . Iohexol      Code: HIVES, Desc: hives w/ itching during cardiac cath '99, mult caths since w/ ? premeds, DR.G.HAYES REQUESTS 13 HR PRE MED//A.C.pt okay w/13 hr prep/mms, Onset Date: [id number]   . Prednisone Other (See Comments)    Hives    Patient Active Problem List   Diagnosis Date Noted  . Pneumonia 01/18/2014  . Varicose veins of lower extremities with other complications 67/34/1937  . Cardiac arrest 12/05/2013  . SVT (supraventricular tachycardia) 12/05/2013  . Acute respiratory failure 12/05/2013  . Hyperkalemia 12/05/2013  . Acute renal insufficiency 12/05/2013  . Acute on chronic systolic heart failure 90/24/0973  . Encounter for therapeutic drug monitoring 10/10/2013  . Dyslipidemia 02/15/2013  . Coronary artery disease 04/08/2011  . Anxiety 04/08/2011  . HTN (hypertension) 02/02/2011  . Biventricular implantable cardioverter-defibrillator in situ 11/03/2010  . Sinus bradycardia 11/03/2010  . Chronic anticoagulation 10/05/2010  . Cardiomyopathy, ischemic   . Chronic systolic heart failure   . Ventricular tachycardia   . LBBB 05/21/2010  . 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 ABDOMINAL AORTIC ANEURYSM 02/07/2009    History  Smoking status  . Former Smoker  . Quit date: 01/07/1994  Smokeless tobacco  . Former Systems developer  . Quit date: 06/01/1987    History  Alcohol Use  . Yes    Comment: very rare    Family History  Problem Relation Age of Onset  . Stroke Mother   . Heart attack Mother   . Heart disease Mother   . Hyperlipidemia Mother   . Hypertension Mother   . Heart attack Father   . Heart disease Father   . Hyperlipidemia Father   . Hypertension Father   .  Heart disease Sister   . Hyperlipidemia Sister   . Hypertension Brother     Review of Systems: Constitutional: no fever chills diaphoresis or fatigue or change in weight.  Head and neck: no hearing loss, no epistaxis, no photophobia or visual disturbance. Respiratory: No cough, shortness of breath or wheezing. Cardiovascular: No chest pain peripheral edema, palpitations. Gastrointestinal: No abdominal distention, no abdominal pain, no change in bowel habits hematochezia or melena. Genitourinary: No dysuria, no frequency, no urgency, no nocturia. Musculoskeletal:No arthralgias, no back pain, no gait  disturbance or myalgias. Neurological: No dizziness, no headaches, no numbness, no seizures, no syncope, no weakness, no tremors. Hematologic: No lymphadenopathy, no easy bruising. Psychiatric: No confusion, no hallucinations, no sleep disturbance.    Physical Exam: Filed Vitals:   01/21/14 1335  BP: 126/68  Pulse: 72   the general appearance reveals a well-developed well-nourished gentleman in no distress.The head and neck exam reveals pupils equal and reactive.  Extraocular movements are full.  There is no scleral icterus.  The mouth and pharynx are normal.  The neck is supple.  The carotids reveal no bruits.  The jugular venous pressure is normal.  The  thyroid is not enlarged.  There is no lymphadenopathy.  The chest is clear to percussion and auscultation.  There are no rales or rhonchi.  Expansion of the chest is symmetrical.  The precordium is quiet.  ICD in left upper chest.  The rhythm is regular.  The first heart sound is normal.  The second heart sound is physiologically split.  There is no murmur gallop rub or click.  There is no abnormal lift or heave.  The abdomen is soft and nontender.  The bowel sounds are normal.  The liver and spleen are not enlarged.  There are no abdominal masses.  There are no abdominal bruits.  Extremities reveal good pedal pulses.  There is no phlebitis or  edema.  There is no cyanosis or clubbing.  Strength is normal and symmetrical in all extremities.  There is no lateralizing weakness.  There are no sensory deficits.  The skin is warm and dry.  There is no rash.    Assessment / Plan: 1. ischemic heart disease with ischemic cardiomyopathy. Ejection fraction 20-25% with apical aneurysm by echocardiogram 12/06/13  2. 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  3. past history of ventricular tachycardia. He has had 3 separate VT ablation procedures was recently in March 2013 by Dr. Rayann Heman  4. hypothyroidism followed by Dr. Forde Dandy  5. hypercholesterolemia  6. recent admission 01/17/14-01/20/14 for dyspnea felt to be probable acute bronchitis as well as slight worsening of his chronic systolic heart failure  Plan: He is now on a slightly higher dose of furosemide since discharge from the hospital.  He takes 20 in the morning and 10 in the afternoon.  We will recheck a basal metabolic panel today and plan to repeat it when he returns for his INR next month. Recheck here in 2 months for office visit and basal metabolic panel.  Continue to monitor her dietary salt intake carefully and continue daily weights.

## 2014-01-21 NOTE — Assessment & Plan Note (Signed)
The patient has not had any recurrent chest pain or angina.  His exercise tolerance is stable.

## 2014-01-21 NOTE — Patient Instructions (Signed)
Will obtain labs today and call you with the results (bmet)  Your physician recommends that you continue on your current medications as directed. Please refer to the Current Medication list given to you today.  Follow up labs when you come for your Coumadin check  Your physician recommends that you schedule a follow-up appointment in: 2 month ov/bmet

## 2014-01-21 NOTE — Assessment & Plan Note (Signed)
The patient was discharged on Zithromax for his suspected pneumonia. Dr. Joylene Draft has also given him probiotics.

## 2014-01-21 NOTE — Assessment & Plan Note (Signed)
The patient has been extremely careful with his dietary salt intake.  He was careful even while they were eating in restaurants in [place].  He weighs himself each day and his weight has been extremely stable.  On his admission last week his initial chest x-ray was suggestive of either asymmetric pulmonary edema or pneumonic infiltrate.  His pro BNP is always chronically elevated but was slightly higher.  He was treated empirically with antibiotics and his Lasix was continued at low dose.  At discharge she was advised to take Lasix 20 mg in the morning and 10 mg in the afternoon.  We're checking a basal metabolic panel today

## 2014-01-22 ENCOUNTER — Other Ambulatory Visit: Payer: Self-pay | Admitting: Cardiology

## 2014-01-22 DIAGNOSIS — R42 Dizziness and giddiness: Secondary | ICD-10-CM

## 2014-02-07 ENCOUNTER — Ambulatory Visit (INDEPENDENT_AMBULATORY_CARE_PROVIDER_SITE_OTHER): Payer: Medicare Other | Admitting: *Deleted

## 2014-02-07 ENCOUNTER — Encounter: Payer: Self-pay | Admitting: Internal Medicine

## 2014-02-07 DIAGNOSIS — I4729 Other ventricular tachycardia: Secondary | ICD-10-CM

## 2014-02-07 DIAGNOSIS — Z95810 Presence of automatic (implantable) cardiac defibrillator: Secondary | ICD-10-CM

## 2014-02-07 DIAGNOSIS — I5022 Chronic systolic (congestive) heart failure: Secondary | ICD-10-CM

## 2014-02-07 DIAGNOSIS — I472 Ventricular tachycardia, unspecified: Secondary | ICD-10-CM

## 2014-02-07 LAB — MDC_IDC_ENUM_SESS_TYPE_REMOTE
Battery Remaining Longevity: 17 mo
Brady Statistic AP VP Percent: 98 %
Brady Statistic AP VS Percent: 1 %
Brady Statistic AS VS Percent: 1 %
Brady Statistic RA Percent Paced: 99 %
Date Time Interrogation Session: 20150910060014
HIGH POWER IMPEDANCE MEASURED VALUE: 52 Ohm
Implantable Pulse Generator Serial Number: 828324
Lead Channel Impedance Value: 350 Ohm
Lead Channel Impedance Value: 400 Ohm
Lead Channel Impedance Value: 980 Ohm
Lead Channel Pacing Threshold Amplitude: 1 V
Lead Channel Pacing Threshold Amplitude: 1 V
Lead Channel Pacing Threshold Pulse Width: 0.5 ms
Lead Channel Sensing Intrinsic Amplitude: 2.2 mV
Lead Channel Setting Pacing Amplitude: 2.5 V
Lead Channel Setting Pacing Pulse Width: 0.5 ms
Lead Channel Setting Pacing Pulse Width: 0.5 ms
Lead Channel Setting Sensing Sensitivity: 0.5 mV
MDC IDC MSMT BATTERY REMAINING PERCENTAGE: 31 %
MDC IDC MSMT BATTERY VOLTAGE: 2.84 V
MDC IDC MSMT LEADCHNL LV PACING THRESHOLD PULSEWIDTH: 0.5 ms
MDC IDC MSMT LEADCHNL RA PACING THRESHOLD AMPLITUDE: 2.25 V
MDC IDC MSMT LEADCHNL RA PACING THRESHOLD PULSEWIDTH: 0.7 ms
MDC IDC MSMT LEADCHNL RV SENSING INTR AMPL: 11.8 mV
MDC IDC SET LEADCHNL LV PACING AMPLITUDE: 2.5 V
MDC IDC SET LEADCHNL RA PACING AMPLITUDE: 3 V
MDC IDC SET ZONE DETECTION INTERVAL: 310 ms
MDC IDC SET ZONE DETECTION INTERVAL: 570 ms
MDC IDC STAT BRADY AS VP PERCENT: 1 %

## 2014-02-07 NOTE — Progress Notes (Signed)
EPIC Encounter for ICM Monitoring  Patient Name: [name] is a 73 y.o. male Date: 02/07/2014 Primary Care Physican: Sheela Stack, MD Primary Cardiologist: Mare Ferrari Electrophysiologist: Faustino Congress Weight: 167 lbs   Bi-V pacing: > 99%      In the past month, have you:  1. Gained more than 2 pounds in a day or more than 5 pounds in a week? No. Previously the patient's weight was 170 lbs. He has trended down to 167 lbs currently.  2. Had changes in your medications (with verification of current medications)? Yes. The patient had taken a trip to [place] recently and when he flew back, he became SOB and states he could "hear" the fluid. He went to the ER and was hospitalized from 8/21-8/23 for possible acute bronchitis/ pneumonia vs. CHF. He was treated with IV antibiotics and also given IV lasix. He was discharged on an increased dose of lasix at 20 mg in the morning and 10 mg in the evening. Per the patient's wife, the patient's chest x-ray was clear at discharge.   3. Had more shortness of breath than is usual for you? Yes. Acute onset on 8/21.  4. Limited your activity because of shortness of breath? no  5. Not been able to sleep because of shortness of breath? no  6. Had increased swelling in your feet or ankles? no  7. Had symptoms of dehydration (dizziness, dry mouth, increased thirst, decreased urine output) no  8. Had changes in sodium restriction? No. The patient states he was very careful with his sodium intake while on vacation in [place]. He states he tried to call ahead to restaurants and assess the menu prior to going. It is quite possible that his diet during the trip to [place] may still have contributed to his increased fluid levels.  9. Been compliant with medication? Yes   ICM trend:   Follow-up plan: ICM clinic phone appointment: 02/25/14. The patient's corvue readings have been quite stable despite being admitted for possible CHF 8/21-8/23. He was negative for PE.  He is doing well at this time. I have made no changes today. He states he feels that he is urinating adequately. He had a bmp on 01/21/14- BUN/ Creatinine- 14/1.3. He is due for a repeat bmp on 02/12/14. I wanted to reassess his corvue readings on 02/18/14 but the patient will be out of town. He will send a transmission on 02/25/14 so I can re-evaluate his symptoms and readings. I have advised that he call with any changes or if any acute changes, he should report to the nearest ER. He is agreeable.  Copy of note sent to patient's primary care physician, primary cardiologist, and device following physician.  Alvis Lemmings, RN, BSN 02/07/2014 11:29 AM

## 2014-02-07 NOTE — Progress Notes (Signed)
Remote ICD transmission.   

## 2014-02-12 ENCOUNTER — Telehealth: Payer: Self-pay | Admitting: *Deleted

## 2014-02-12 ENCOUNTER — Ambulatory Visit (INDEPENDENT_AMBULATORY_CARE_PROVIDER_SITE_OTHER): Payer: Medicare Other | Admitting: Pharmacist

## 2014-02-12 ENCOUNTER — Other Ambulatory Visit (INDEPENDENT_AMBULATORY_CARE_PROVIDER_SITE_OTHER): Payer: Medicare Other

## 2014-02-12 DIAGNOSIS — Z79899 Other long term (current) drug therapy: Secondary | ICD-10-CM

## 2014-02-12 DIAGNOSIS — I2589 Other forms of chronic ischemic heart disease: Secondary | ICD-10-CM

## 2014-02-12 DIAGNOSIS — Z7901 Long term (current) use of anticoagulants: Secondary | ICD-10-CM

## 2014-02-12 DIAGNOSIS — I255 Ischemic cardiomyopathy: Secondary | ICD-10-CM

## 2014-02-12 DIAGNOSIS — Z5181 Encounter for therapeutic drug level monitoring: Secondary | ICD-10-CM

## 2014-02-12 LAB — BASIC METABOLIC PANEL
BUN: 15 mg/dL (ref 6–23)
CHLORIDE: 100 meq/L (ref 96–112)
CO2: 30 meq/L (ref 19–32)
Calcium: 9.3 mg/dL (ref 8.4–10.5)
Creatinine, Ser: 1.2 mg/dL (ref 0.4–1.5)
GFR: 62 mL/min (ref >60.00)
Glucose, Bld: 91 mg/dL (ref 70–99)
Potassium: 4.1 mEq/L (ref 3.5–5.1)
Sodium: 136 mEq/L (ref 135–145)

## 2014-02-12 LAB — POCT INR: INR: 2.3

## 2014-02-12 NOTE — Progress Notes (Signed)
Quick Note:  Please report to patient. The recent labs are stable. Continue same medication and careful diet. Potassium is normal at 4.1 ______

## 2014-02-12 NOTE — Telephone Encounter (Signed)
Patient Findings     Negatives Missed Doses, Extra Doses, Medication Changes, Antibiotic Use, Diet Changes, Dental/Other Procedures, Hospitalization, Bleeding Gums, Nose Bleeds, Blood in Urine, Blood in Stool, Any Bruising, Other Complaints    Comments Pt reports BP running in the 44I systolic and having less energy. Discussed with Dr. Mare Ferrari. Will decrease losartan to 25mg  daily and have pt call if BP does not improve.     Above per coumadin clinic visit with Maebelle Munroe D today.

## 2014-02-18 ENCOUNTER — Other Ambulatory Visit: Payer: Medicare Other

## 2014-02-25 ENCOUNTER — Encounter: Payer: Self-pay | Admitting: *Deleted

## 2014-02-25 ENCOUNTER — Ambulatory Visit (INDEPENDENT_AMBULATORY_CARE_PROVIDER_SITE_OTHER): Payer: Medicare Other | Admitting: *Deleted

## 2014-02-25 DIAGNOSIS — Z95810 Presence of automatic (implantable) cardiac defibrillator: Secondary | ICD-10-CM

## 2014-02-25 DIAGNOSIS — I5022 Chronic systolic (congestive) heart failure: Secondary | ICD-10-CM

## 2014-02-25 NOTE — Progress Notes (Signed)
EPIC Encounter for ICM Monitoring  Patient Name: [name] is a 73 y.o. male Date: 02/25/2014 Primary Care Physican: Sheela Stack, MD Primary Cardiologist: Mare Ferrari Electrophysiologist: Faustino Congress Weight: 167 lbs   Bi-V pacing: > 99%      In the past month, have you:  1. Gained more than 2 pounds in a day or more than 5 pounds in a week? no  2. Had changes in your medications (with verification of current medications)? no  3. Had more shortness of breath than is usual for you? no  4. Limited your activity because of shortness of breath? no  5. Not been able to sleep because of shortness of breath? no  6. Had increased swelling in your feet or ankles? no  7. Had symptoms of dehydration (dizziness, dry mouth, increased thirst, decreased urine output) no  8. Had changes in sodium restriction? no  9. Been compliant with medication? Yes   ICM trend:   Follow-up plan: ICM clinic phone appointment: 03/12/2014. This was a 2 week follow up for the patient. I last followed the patient on 02/07/14. Prior to that, he developed acute onset of SOB after getting off an airplane. He was admitted with acute CHF vs. Bronchitis/pneumonia. His corvue readings are slightly elevated from ~9/19-9/26. The patient had traveled to the beach last week. He states that they did not eat out while they were there and he walked 2 & 1/2 miles on Wednesday. He was symptom free and his weights have been stable. His impedence has returned to baseline. No changes made today. I will recheck him in 2 weeks.   Copy of note sent to patient's primary care physician, primary cardiologist, and device following physician.  Alvis Lemmings, RN, BSN 02/25/2014 3:22 PM

## 2014-03-04 ENCOUNTER — Encounter: Payer: Self-pay | Admitting: Cardiology

## 2014-03-12 ENCOUNTER — Ambulatory Visit (INDEPENDENT_AMBULATORY_CARE_PROVIDER_SITE_OTHER): Payer: Medicare Other

## 2014-03-12 ENCOUNTER — Telehealth: Payer: Self-pay | Admitting: Cardiology

## 2014-03-12 ENCOUNTER — Ambulatory Visit (INDEPENDENT_AMBULATORY_CARE_PROVIDER_SITE_OTHER): Payer: Medicare Other | Admitting: *Deleted

## 2014-03-12 DIAGNOSIS — Z95810 Presence of automatic (implantable) cardiac defibrillator: Secondary | ICD-10-CM

## 2014-03-12 DIAGNOSIS — Z5181 Encounter for therapeutic drug level monitoring: Secondary | ICD-10-CM

## 2014-03-12 DIAGNOSIS — I5022 Chronic systolic (congestive) heart failure: Secondary | ICD-10-CM

## 2014-03-12 DIAGNOSIS — I255 Ischemic cardiomyopathy: Secondary | ICD-10-CM

## 2014-03-12 DIAGNOSIS — Z7901 Long term (current) use of anticoagulants: Secondary | ICD-10-CM

## 2014-03-12 LAB — POCT INR: INR: 2.2

## 2014-03-12 NOTE — Telephone Encounter (Signed)
Ok to hold losartan for a few days and see if he feels better. TB

## 2014-03-12 NOTE — Telephone Encounter (Signed)
The patient is aware of Dr. Sherryl Barters recommendations. He has been advised to take and record his BP readings. He is going out of town tomorrow. He will call us back when he returns to town and let us know how his readings have been.

## 2014-03-12 NOTE — Telephone Encounter (Signed)
I did check the patient's blood pressure while he was here in the office today:  Right arm- 98/52 Left arm- 110/68

## 2014-03-12 NOTE — Progress Notes (Signed)
EPIC Encounter for ICM Monitoring  Patient Name: [name] is a 73 y.o. male Date: 03/12/2014 Primary Care Physican: Sheela Stack, MD Primary Cardiologist: Mare Ferrari Electrophysiologist: Faustino Congress Weight: 167 lbs   Bi-V pacing: > 99%     In the past month, have you:  1. Gained more than 2 pounds in a day or more than 5 pounds in a week? no  2. Had changes in your medications (with verification of current medications)? no  3. Had more shortness of breath than is usual for you? no  4. Limited your activity because of shortness of breath? no  5. Not been able to sleep because of shortness of breath? no  6. Had increased swelling in your feet or ankles? no  7. Had symptoms of dehydration (dizziness, dry mouth, increased thirst, decreased urine output) no  8. Had changes in sodium restriction? no  9. Been compliant with medication? Yes  ** The patient was in the office today for CVRR follow up. I reviewed his Corvue readings with him in the office. His only complaint today is low BP readings that he is getting in the mornings. Per his report, he takes Cozaar 25 mg daily at bedtime. In the mornings his SBP readings are averaging in the 90's. He states he doesn't feel well. He feels like he is stumbling around. He feels this does get better as the day goes on and will not SBP readings usually around 108-110. He walks daily for exercise and does fine with this. He would like to know if Dr. Mare Ferrari would be ok with him holding Cozaar for a few days to see if symptoms improve. I will review with Dr. Mare Ferrari and notify the patient.    ICM trend:   Follow-up plan: ICM clinic phone appointment: 04/11/14. The patient appears to have rises in his fluid readings associated with travel. He is very careful with his sodium content even when he is away from home. He is traveling to the [place]. I have advised him to stop every hour to 2 hours and walk around. He verbalizes  understanding. I have asked him to send a manuel transmission when he returns home, if he feels he is having any trouble with fluid intake. Otherwise, I will follow up with him in 1 month.   Copy of note sent to patient's primary care physician, primary cardiologist, and device following physician.  Alvis Lemmings, RN, BSN 03/12/2014 10:05 AM

## 2014-03-12 NOTE — Telephone Encounter (Signed)
The patient was in the office today for CVRR follow up. I reviewed his Corvue readings with him in the office. His only complaint today is low BP readings that he is getting in the mornings. Per his report, he takes Cozaar 25 mg daily at bedtime. In the mornings his SBP readings are averaging in the 90's. He states he doesn't feel well. He feels like he is stumbling around. He feels this does get better as the day goes on and will get SBP readings usually around 108-110. He walks daily for exercise and does fine with this. He would like to know if Dr. Mare Ferrari would be ok with him holding Cozaar for a few days to see if symptoms improve. I will review with Dr. Mare Ferrari and notify the patient. He is agreeable.

## 2014-03-15 ENCOUNTER — Other Ambulatory Visit: Payer: Self-pay

## 2014-03-16 ENCOUNTER — Other Ambulatory Visit: Payer: Self-pay | Admitting: Cardiology

## 2014-03-16 NOTE — Telephone Encounter (Signed)
Rx was sent to pharmacy electronically. 

## 2014-03-18 ENCOUNTER — Encounter: Payer: Self-pay | Admitting: Internal Medicine

## 2014-03-25 ENCOUNTER — Telehealth: Payer: Self-pay | Admitting: *Deleted

## 2014-03-25 NOTE — Telephone Encounter (Signed)
LMOVM w/ my direct #.   Re:  need to adjust device while here seeing Dr. Mare Ferrari on 04-01-14.

## 2014-03-28 ENCOUNTER — Encounter: Payer: Self-pay | Admitting: Internal Medicine

## 2014-03-29 ENCOUNTER — Other Ambulatory Visit: Payer: Self-pay | Admitting: Cardiology

## 2014-04-01 ENCOUNTER — Encounter: Payer: Self-pay | Admitting: Cardiology

## 2014-04-01 ENCOUNTER — Other Ambulatory Visit: Payer: Self-pay | Admitting: *Deleted

## 2014-04-01 ENCOUNTER — Other Ambulatory Visit (INDEPENDENT_AMBULATORY_CARE_PROVIDER_SITE_OTHER): Payer: Medicare Other | Admitting: *Deleted

## 2014-04-01 ENCOUNTER — Ambulatory Visit (INDEPENDENT_AMBULATORY_CARE_PROVIDER_SITE_OTHER): Payer: Medicare Other | Admitting: Cardiology

## 2014-04-01 VITALS — BP 112/70 | HR 74 | Ht 68.0 in | Wt 172.0 lb

## 2014-04-01 DIAGNOSIS — Z79899 Other long term (current) drug therapy: Secondary | ICD-10-CM

## 2014-04-01 DIAGNOSIS — I2583 Coronary atherosclerosis due to lipid rich plaque: Secondary | ICD-10-CM

## 2014-04-01 DIAGNOSIS — I255 Ischemic cardiomyopathy: Secondary | ICD-10-CM

## 2014-04-01 DIAGNOSIS — I2510 Atherosclerotic heart disease of native coronary artery without angina pectoris: Secondary | ICD-10-CM

## 2014-04-01 DIAGNOSIS — I5023 Acute on chronic systolic (congestive) heart failure: Secondary | ICD-10-CM

## 2014-04-01 LAB — BASIC METABOLIC PANEL
BUN: 16 mg/dL (ref 6–23)
CALCIUM: 9.5 mg/dL (ref 8.4–10.5)
CO2: 29 meq/L (ref 19–32)
CREATININE: 1.3 mg/dL (ref 0.4–1.5)
Chloride: 100 mEq/L (ref 96–112)
GFR: 58.64 mL/min — ABNORMAL LOW (ref >60.00)
GLUCOSE: 92 mg/dL (ref 70–99)
Potassium: 4 mEq/L (ref 3.5–5.1)
Sodium: 136 mEq/L (ref 135–145)

## 2014-04-01 MED ORDER — CARVEDILOL 12.5 MG PO TABS: 12.5000 mg | ORAL_TABLET | Freq: Two times a day (BID) | ORAL | Status: DC

## 2014-04-01 NOTE — Progress Notes (Signed)
Quick Note:  Please report to patient. The recent labs are stable. Continue same medication and careful diet. ______ 

## 2014-04-01 NOTE — Progress Notes (Signed)
[name] Date of Birth:  03-09-1941 [facility] [address] [place], [place]  [address] [phone]        Fax   [phone]   History of Present Illness:  This pleasant 73 year old gentleman is seen for a scheduled follow-up office visit.  The patient had been  admitted on 01/17/14 and discharged on 01/20/14.  He was admitted after he arrived home from an air plane flight from [place] where he had been on vacation for the previous week.  When the plane landed he had difficulty with increasing dyspnea and his wife became concerned because she could hear his breathing with a rattling noise.  This was similar to her to his last hospitalization and he was admitted with acute pulmonary edema and required brief CPR and closed chest massage.  At that time he was admitted on July 8 and discharged on July 11. He presented with acute pulmonary edema. He required brief CPR by EMS at his home. His wife describes frothy sputum at home which came on very quickly. He has a history of a remote anterior wall myocardial infarction. He has an ischemic cardiomyopathy. He has a left ventricular apical aneurysm. He has had a defibrillator for ventricular tachycardia. He has been followed closely by Dr. Caryl Comes and Dr. Rayann Heman. 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 Subsequently he had recurrent ventricular tachycardia and was transferred to [facility] for incessant VT and underwent a second ablation procedure complicated by pericardial perforation and pericarditis. He underwent a third catheter VT ablation procedure in March 2013 by Dr. Rayann Heman and since then has felt well. He has not been aware of any palpitations. He has not been aware of any shocks from his ICD. He does have a known ischemic cardiomyopathy with ejection fraction of 25-30% by echocardiogram in February 2012. In autumn 2014 he had some problems with fluid overload [place] and responded to increased diuretic and to dietary salt restriction. At the time of this prior admission his echocardiogram on 12/06/13 showed an ejection fraction of 20-25% and there was an apical aneurysm. Since last visit he has noted that his heart races at times and he has been a little more short of breath when he tries to walk up hills.  He does not have any difficulty on level ground.  Current Outpatient Prescriptions  Medication Sig Dispense Refill  . ALPRAZolam (XANAX) 0.25 MG tablet Take 0.125-0.25 mg by mouth 4 (four) times daily.    . Ascorbic Acid (VITAMIN C) 500 MG tablet Take 500 mg by mouth daily.      . carvedilol (COREG) 12.5 MG tablet Take 1 tablet (12.5 mg total) by mouth 2 (two) times daily with a meal. Take one tablet by mouth twice daily 60 tablet 11  . Docusate Calcium (STOOL SOFTENER PO) Take 1 capsule by mouth daily.    . furosemide (LASIX) 20 MG tablet Take 20 mg by mouth daily. TAKES 10MG  IN AFTERNOON.(CUTS 20 MG IN HALF)    . levothyroxine (SYNTHROID, LEVOTHROID) 88 MCG tablet Take 88-132 mcg by mouth daily. Take 1.5 tablets on Friday and 1 tablet the rest of the week.    . loratadine (CLARITIN) 10 MG tablet Take 10 mg by mouth daily.    [name] losartan (COZAAR) 50 MG tablet Take 0.5 tablets (25 mg total) by mouth daily. 90 tablet 3  . meclizine (ANTIVERT) 25  MG tablet TAKE 1 TABLET (25 MG TOTAL) BY MOUTH 3 (THREE) TIMES DAILY AS NEEDED. 270 tablet 2  . mexiletine (MEXITIL) 150 MG capsule TAKE 1 CAPSULE THREE TIMES A DAY 270 capsule 0  . Multiple Vitamin (MULTIVITAMIN WITH MINERALS) TABS tablet Take 1 tablet by mouth daily.    . Omega-3 Fatty Acids (FISH OIL) 1000 MG CAPS Take 1,000 mg by mouth 2 (two) times daily.     . ranitidine (ZANTAC) 150 MG tablet Take 150 mg by mouth every evening.    . rosuvastatin (CRESTOR) 5 MG tablet Take 5 mg by mouth 2 (two) times a week. Every Tuesday and Saturday    . sotalol (BETAPACE) 80 MG tablet Take 80 mg by mouth 2  (two) times daily.    [name] warfarin (COUMADIN) 2.5 MG tablet TAKE AS DIRECTED BY ANTICOAGULATION CLINIC 120 tablet 0   No current facility-administered medications for this visit.    Allergies  Allergen Reactions  . Iohexol      Code: HIVES, Desc: hives w/ itching during cardiac cath '99, mult caths since w/ ? premeds, DR.G.HAYES REQUESTS 13 HR PRE MED//A.C.pt okay w/13 hr prep/mms, Onset Date: [id number]   . Prednisone Other (See Comments)    Hives    Patient Active Problem List   Diagnosis Date Noted  . Pneumonia 01/18/2014  . Varicose veins of lower extremities with other complications 02/72/5366  . Cardiac arrest 12/05/2013  . SVT (supraventricular tachycardia) 12/05/2013  . Acute respiratory failure 12/05/2013  . Hyperkalemia 12/05/2013  . Acute renal insufficiency 12/05/2013  . Acute on chronic systolic heart failure 44/07/4740  . Encounter for therapeutic drug monitoring 10/10/2013  . Dyslipidemia 02/15/2013  . Coronary artery disease 04/08/2011  . Anxiety 04/08/2011  . HTN (hypertension) 02/02/2011  . Biventricular implantable cardioverter-defibrillator in situ 11/03/2010  . Sinus bradycardia 11/03/2010  . Chronic anticoagulation 10/05/2010  . Cardiomyopathy, ischemic   . Chronic systolic heart failure   . Ventricular tachycardia   . LBBB 05/21/2010  . 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 ABDOMINAL AORTIC ANEURYSM 02/07/2009    History  Smoking status  . Former Smoker  . Quit date: 01/07/1994  Smokeless tobacco  . Former Systems developer  . Quit date: 06/01/1987    History  Alcohol Use  . Yes    Comment: very rare    Family History  Problem Relation Age of Onset  . Stroke Mother   . Heart attack Mother   . Heart disease Mother   . Hyperlipidemia Mother   . Hypertension Mother   . Heart attack Father   . Heart disease Father   . Hyperlipidemia Father   . Hypertension Father   . Heart disease Sister   . Hyperlipidemia Sister   . Hypertension Brother     Review of  Systems: Constitutional: no fever chills diaphoresis or fatigue or change in weight.  Head and neck: no hearing loss, no epistaxis, no photophobia or visual disturbance. Respiratory: No cough, shortness of breath or wheezing. Cardiovascular: No chest pain peripheral edema, palpitations. Gastrointestinal: No abdominal distention, no abdominal pain, no change in bowel habits hematochezia or melena. Genitourinary: No dysuria, no frequency, no urgency, no nocturia. Musculoskeletal:No arthralgias, no back pain, no gait disturbance or myalgias. Neurological: No dizziness, no headaches, no numbness, no seizures, no syncope, no weakness, no tremors. Hematologic: No lymphadenopathy, no easy bruising. Psychiatric: No confusion, no hallucinations, no sleep disturbance.    Physical Exam: Filed Vitals:   04/01/14 1149  BP: 112/70  Pulse: 74  the general appearance reveals a well-developed well-nourished gentleman in no distress.The head and neck exam reveals pupils equal and reactive.  Extraocular movements are full.  There is no scleral icterus.  The mouth and pharynx are normal.  The neck is supple.  The carotids reveal no bruits.  The jugular venous pressure is normal.  The  thyroid is not enlarged.  There is no lymphadenopathy.  The chest is clear to percussion and auscultation.  There are no rales or rhonchi.  Expansion of the chest is symmetrical.  The precordium is quiet.  ICD in left upper chest.  The rhythm is regular.  The first heart sound is normal.  The second heart sound is physiologically split.  There is no murmur gallop rub or click.  There is no abnormal lift or heave.  The abdomen is soft and nontender.  The bowel sounds are normal.  The liver and spleen are not enlarged.  There are no abdominal masses.  There are no abdominal bruits.  Extremities reveal good pedal pulses.  There is no phlebitis or edema.  There is no cyanosis or clubbing.  Strength is normal and symmetrical in all  extremities.  There is no lateralizing weakness.  There are no sensory deficits.  The skin is warm and dry.  There is no rash.    Assessment / Plan: 1. ischemic heart disease with ischemic cardiomyopathy. Ejection fraction 20-25% with apical aneurysm by echocardiogram 12/06/13  2. 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  3. past history of ventricular tachycardia. He has had 3 separate VT ablation procedures, most recently in March 2013 by Dr. Rayann Heman  4. hypothyroidism followed by Dr. Forde Dandy  5. hypercholesterolemia  6. recent admission 01/17/14-01/20/14 for dyspnea felt to be probable acute bronchitis as well as slight worsening of his chronic systolic heart failure.normal VQ scan  Plan: Continue current medication except increase carvedilol to 12.5 mg twice a day for left ventricular systolic dysfunction and exertional dyspnea on hills. Lab work pending today.  Recheck in 4-6 weeks for follow-up office visit and EKG.

## 2014-04-01 NOTE — Patient Instructions (Addendum)
Will obtain labs today and call you with the results (BMET)  INCREASE CARVEDILOL TO 12.5 MG TWICE A DAY  Your physician recommends that you schedule a follow-up appointment in: 4-6 WEEKS

## 2014-04-01 NOTE — Assessment & Plan Note (Signed)
The patient feels that his exertional capacity was better when he was on higher doses of carvedilol.  Presently he has been taking just 6.25 mg twice a day.  His blood pressure is adequate.  We will try increasing his carvedilol up to 12.5 mg twice a day.

## 2014-04-01 NOTE — Assessment & Plan Note (Signed)
The patient has not had any recurrent chest pain or angina pectoris.

## 2014-04-01 NOTE — Assessment & Plan Note (Addendum)
The patient has not had any worsening of fluid retention.  His weight has been extremely steady over the past month.  His wife watches his sodium intake very carefully.  He has not been having any paroxysmal nocturnal dyspnea or peripheral edema.he has been taking 20 minutes in the morning and 10 mg in the afternoon.  We are checking a basal metabolic panel today.

## 2014-04-11 ENCOUNTER — Ambulatory Visit (INDEPENDENT_AMBULATORY_CARE_PROVIDER_SITE_OTHER): Payer: Medicare Other | Admitting: Pharmacist Clinician (PhC)/ Clinical Pharmacy Specialist

## 2014-04-11 DIAGNOSIS — Z5181 Encounter for therapeutic drug level monitoring: Secondary | ICD-10-CM

## 2014-04-11 DIAGNOSIS — I255 Ischemic cardiomyopathy: Secondary | ICD-10-CM

## 2014-04-11 DIAGNOSIS — Z7901 Long term (current) use of anticoagulants: Secondary | ICD-10-CM

## 2014-04-11 LAB — POCT INR: INR: 2.4

## 2014-04-12 ENCOUNTER — Encounter: Payer: Self-pay | Admitting: *Deleted

## 2014-04-12 ENCOUNTER — Ambulatory Visit (INDEPENDENT_AMBULATORY_CARE_PROVIDER_SITE_OTHER): Payer: Medicare Other | Admitting: *Deleted

## 2014-04-12 DIAGNOSIS — I5023 Acute on chronic systolic (congestive) heart failure: Secondary | ICD-10-CM

## 2014-04-12 DIAGNOSIS — Z95810 Presence of automatic (implantable) cardiac defibrillator: Secondary | ICD-10-CM

## 2014-04-12 NOTE — Progress Notes (Signed)
EPIC Encounter for ICM Monitoring  Patient Name: [name] is a 73 y.o. male Date: 04/12/2014 Primary Care Physican: Sheela Stack, MD Primary Cardiologist: Mare Ferrari Electrophysiologist: Caryl Comes Dry Weight: 167 lbs  Bi-V pacing: 98%      In the past month, have you:  1. Gained more than 2 pounds in a day or more than 5 pounds in a week? no  2. Had changes in your medications (with verification of current medications)? Yes. The patient had a follow up with Dr. Mare Ferrari on 04/01/14 and his coreg was increased to 12.5 mg BID. He is feeling good on this dose.  3. Had more shortness of breath than is usual for you? no  4. Limited your activity because of shortness of breath? no  5. Not been able to sleep because of shortness of breath? no  6. Had increased swelling in your feet or ankles? no  7. Had symptoms of dehydration (dizziness, dry mouth, increased thirst, decreased urine output) no  8. Had changes in sodium restriction? no  9. Been compliant with medication? Yes   ICM trend:   Follow-up plan: ICM clinic phone appointment: 05/13/14. The patient states he is feeling "great, the best I've felt in a while." His fluid readings were up from ~ 10/23-10/31. He did not have change in symptoms or weights at that time. The patient's AT/AF burden was up to ~9% over the last month. Reviewed with Ned Grace, device tech. On 10/22, it looks as though the patient was in a-flutter for about 22 hours. I do not see a documented history of a-flutter in his chart. He is on warfarin already. Will forward to Dr. Caryl Comes for his review on remote transmission.  Copy of note sent to patient's primary care physician, primary cardiologist, and device following physician.  Alvis Lemmings, RN, BSN 04/12/2014 11:51 AM

## 2014-04-15 ENCOUNTER — Other Ambulatory Visit: Payer: Self-pay | Admitting: Cardiology

## 2014-05-01 ENCOUNTER — Encounter: Payer: Self-pay | Admitting: Cardiology

## 2014-05-02 ENCOUNTER — Ambulatory Visit (INDEPENDENT_AMBULATORY_CARE_PROVIDER_SITE_OTHER): Payer: Medicare Other | Admitting: Cardiology

## 2014-05-02 ENCOUNTER — Encounter: Payer: Self-pay | Admitting: Cardiology

## 2014-05-02 VITALS — BP 126/84 | HR 70 | Ht 68.0 in | Wt 168.0 lb

## 2014-05-02 DIAGNOSIS — I2510 Atherosclerotic heart disease of native coronary artery without angina pectoris: Secondary | ICD-10-CM

## 2014-05-02 DIAGNOSIS — Z7901 Long term (current) use of anticoagulants: Secondary | ICD-10-CM

## 2014-05-02 DIAGNOSIS — I255 Ischemic cardiomyopathy: Secondary | ICD-10-CM

## 2014-05-02 DIAGNOSIS — I5022 Chronic systolic (congestive) heart failure: Secondary | ICD-10-CM

## 2014-05-02 NOTE — Progress Notes (Signed)
[name] Date of Birth:  07-Apr-1941 [facility] [address] [place], [place]  [address] [phone]        Fax   [phone]   History of Present Illness:  This pleasant 73 year old gentleman is seen for a scheduled follow-up office visit.  The patient had been  admitted on 01/17/14 and discharged on 01/20/14.  He was admitted after he arrived home from an air plane flight from [place] where he had been on vacation for the previous week.  When the plane landed he had difficulty with increasing dyspnea and his wife became concerned because she could hear his breathing with a rattling noise.  This was similar to her to his last hospitalization and he was admitted with acute pulmonary edema and required brief CPR and closed chest massage.  At that time he was admitted on July 8 and discharged on July 11. He presented with acute pulmonary edema. He required brief CPR by EMS at his home. His wife describes frothy sputum at home which came on very quickly. He has a history of a remote anterior wall myocardial infarction. He has an ischemic cardiomyopathy. He has a left ventricular apical aneurysm. He has had a defibrillator for ventricular tachycardia. He has been followed closely by Dr. Caryl Comes and Dr. Rayann Heman. 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 Subsequently he had recurrent ventricular tachycardia and was transferred to [facility] for incessant VT and underwent a second ablation procedure complicated by pericardial perforation and pericarditis. He underwent a third catheter VT ablation procedure in March 2013 by Dr. Rayann Heman and since then has felt well. He has not been aware of any palpitations. He has not been aware of any shocks from his ICD. He does have a known ischemic cardiomyopathy with ejection fraction of 25-30% by echocardiogram in February 2012. In autumn 2014 he had some problems with fluid overload [place] responded to increased diuretic and to dietary salt restriction. At the time of this prior admission his echocardiogram on 12/06/13 showed an ejection fraction of 20-25% and there was an apical aneurysm. Since last visit he has been on a higher dose of carvedilol 12.5 mg twice a day and he feels that his exercise tolerance has improved slightly.  He is able to walk 40 minutes without  He has not been having any chest pain.  Current Outpatient Prescriptions  Medication Sig Dispense Refill  . ALPRAZolam (XANAX) 0.25 MG tablet Take 0.125-0.25 mg by mouth 4 (four) times daily.    . Ascorbic Acid (VITAMIN C) 500 MG tablet Take 500 mg by mouth daily.      . carvedilol (COREG) 12.5 MG tablet Take 1 tablet (12.5 mg total) by mouth 2 (two) times daily with a meal. Take one tablet by mouth twice daily 60 tablet 11  . Docusate Calcium (STOOL SOFTENER PO) Take 1 capsule by mouth daily.    . furosemide (LASIX) 20 MG tablet Take 20 mg by mouth daily. TAKES 10MG  IN AFTERNOON.(CUTS 20 MG IN HALF)    . levothyroxine (SYNTHROID, LEVOTHROID) 88 MCG tablet Take 88-132 mcg by mouth daily. Take 1.5 tablets on Friday and 1 tablet the rest of the week.    . loratadine (CLARITIN) 10 MG tablet Take 10 mg by mouth daily.    [name] losartan (COZAAR) 50 MG tablet Take 0.5 tablets (25 mg total) by mouth daily. 90 tablet  3  . meclizine (ANTIVERT) 25 MG tablet TAKE 1 TABLET (25 MG TOTAL) BY MOUTH 3 (THREE) TIMES DAILY AS NEEDED. 270 tablet 2  . mexiletine (MEXITIL) 150 MG capsule TAKE 1 CAPSULE THREE TIMES A DAY 270 capsule 1  . Multiple Vitamin (MULTIVITAMIN WITH MINERALS) TABS tablet Take 1 tablet by mouth daily.    . Omega-3 Fatty Acids (FISH OIL) 1000 MG CAPS Take 1,000 mg by mouth 2 (two) times daily.     . ranitidine (ZANTAC) 150 MG tablet Take 150 mg by mouth every evening.    . rosuvastatin (CRESTOR) 5 MG tablet Take 5 mg by mouth 2 (two) times a week. Every Tuesday and Saturday    . sotalol (BETAPACE)  80 MG tablet Take 80 mg by mouth 2 (two) times daily.    [name] warfarin (COUMADIN) 2.5 MG tablet TAKE AS DIRECTED BY ANTICOAGULATION CLINIC 120 tablet 0   No current facility-administered medications for this visit.    Allergies  Allergen Reactions  . Iohexol      Code: HIVES, Desc: hives w/ itching during cardiac cath '99, mult caths since w/ ? premeds, DR.G.HAYES REQUESTS 13 HR PRE MED//A.C.pt okay w/13 hr prep/mms, Onset Date: [id number]   . Prednisone Other (See Comments)    Hives    Patient Active Problem List   Diagnosis Date Noted  . Pneumonia 01/18/2014  . Varicose veins of lower extremities with other complications 87/56/4332  . Cardiac arrest 12/05/2013  . SVT (supraventricular tachycardia) 12/05/2013  . Acute respiratory failure 12/05/2013  . Hyperkalemia 12/05/2013  . Acute renal insufficiency 12/05/2013  . Acute on chronic systolic heart failure 95/18/8416  . Encounter for therapeutic drug monitoring 10/10/2013  . Dyslipidemia 02/15/2013  . Coronary artery disease 04/08/2011  . Anxiety 04/08/2011  . HTN (hypertension) 02/02/2011  . Biventricular implantable cardioverter-defibrillator in situ 11/03/2010  . Sinus bradycardia 11/03/2010  . Chronic anticoagulation 10/05/2010  . Cardiomyopathy, ischemic   . Chronic systolic heart failure   . Ventricular tachycardia   . LBBB 05/21/2010  . 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 ABDOMINAL AORTIC ANEURYSM 02/07/2009    History  Smoking status  . Former Smoker  . Quit date: 01/07/1994  Smokeless tobacco  . Former Systems developer  . Quit date: 06/01/1987    History  Alcohol Use  . Yes    Comment: very rare    Family History  Problem Relation Age of Onset  . Stroke Mother   . Heart attack Mother   . Heart disease Mother   . Hyperlipidemia Mother   . Hypertension Mother   . Heart attack Father   . Heart disease Father   . Hyperlipidemia Father   . Hypertension Father   . Heart disease Sister   . Hyperlipidemia Sister   .  Hypertension Brother     Review of Systems: Constitutional: no fever chills diaphoresis or fatigue or change in weight.  Head and neck: no hearing loss, no epistaxis, no photophobia or visual disturbance. Respiratory: No cough, shortness of breath or wheezing. Cardiovascular: No chest pain peripheral edema, palpitations. Gastrointestinal: No abdominal distention, no abdominal pain, no change in bowel habits hematochezia or melena. Genitourinary: No dysuria, no frequency, no urgency, no nocturia. Musculoskeletal:No arthralgias, no back pain, no gait disturbance or myalgias. Neurological: No dizziness, no headaches, no numbness, no seizures, no syncope, no weakness, no tremors. Hematologic: No lymphadenopathy, no easy bruising. Psychiatric: No confusion, no hallucinations, no sleep disturbance.    Physical Exam: Filed Vitals:   05/02/14 1037  BP: 126/84  Pulse: 70   the general appearance reveals a well-developed well-nourished gentleman in no distress.The head and neck exam reveals pupils equal and reactive.  Extraocular movements are full.  There is no scleral icterus.  The mouth and pharynx are normal.  The neck is supple.  The carotids reveal no bruits.  The jugular venous pressure is normal.  The  thyroid is not enlarged.  There is no lymphadenopathy.  The chest is clear to percussion and auscultation.  There are no rales or rhonchi.  Expansion of the chest is symmetrical.  The precordium is quiet.  ICD in left upper chest.  The rhythm is regular.  The first heart sound is normal.  The second heart sound is physiologically split.  There is no murmur gallop rub or click.  There is no abnormal lift or heave.  The abdomen is soft and nontender.  The bowel sounds are normal.  The liver and spleen are not enlarged.  There are no abdominal masses.  There are no abdominal bruits.  Extremities reveal good pedal pulses.  There is no phlebitis or edema.  There is no cyanosis or clubbing.  Strength is  normal and symmetrical in all extremities.  There is no lateralizing weakness.  There are no sensory deficits.  The skin is warm and dry.  There is no rash.    Assessment / Plan: 1. ischemic heart disease with ischemic cardiomyopathy. Ejection fraction 20-25% with apical aneurysm by echocardiogram 12/06/13  2. 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  3. past history of ventricular tachycardia. He has had 3 separate VT ablation procedures, most recently in March 2013 by Dr. Rayann Heman  4. hypothyroidism followed by Dr. Forde Dandy  5. hypercholesterolemia  6. recent admission 01/17/14-01/20/14 for dyspnea felt to be probable acute bronchitis as well as slight worsening of his chronic systolic heart failure.normal VQ scan  Plan: Continue current medication.  Continue carvedilol  12.5 mg twice a day for left ventricular systolic dysfunction and exertional dyspnea on hills. Lab work pending today.  Recheck in 4 months for follow-up office visit

## 2014-05-02 NOTE — Patient Instructions (Signed)
Your physician recommends that you continue on your current medications as directed. Please refer to the Current Medication list given to you today.  Your physician wants you to follow-up in: 4 month ov You will receive a reminder letter in the mail two months in advance. If you don't receive a letter, please call our office to schedule the follow-up appointment.  

## 2014-05-03 ENCOUNTER — Encounter: Payer: Self-pay | Admitting: Internal Medicine

## 2014-05-06 ENCOUNTER — Encounter: Payer: Self-pay | Admitting: Internal Medicine

## 2014-05-06 ENCOUNTER — Ambulatory Visit (INDEPENDENT_AMBULATORY_CARE_PROVIDER_SITE_OTHER): Payer: Medicare Other | Admitting: Internal Medicine

## 2014-05-06 VITALS — BP 120/72 | HR 93 | Ht 68.0 in | Wt 173.0 lb

## 2014-05-06 DIAGNOSIS — I2510 Atherosclerotic heart disease of native coronary artery without angina pectoris: Secondary | ICD-10-CM

## 2014-05-06 DIAGNOSIS — I472 Ventricular tachycardia, unspecified: Secondary | ICD-10-CM

## 2014-05-06 DIAGNOSIS — I5022 Chronic systolic (congestive) heart failure: Secondary | ICD-10-CM

## 2014-05-06 DIAGNOSIS — I255 Ischemic cardiomyopathy: Secondary | ICD-10-CM

## 2014-05-06 DIAGNOSIS — I5023 Acute on chronic systolic (congestive) heart failure: Secondary | ICD-10-CM

## 2014-05-06 DIAGNOSIS — Z4502 Encounter for adjustment and management of automatic implantable cardiac defibrillator: Secondary | ICD-10-CM

## 2014-05-06 LAB — BASIC METABOLIC PANEL
BUN: 12 mg/dL (ref 6–23)
CALCIUM: 9.4 mg/dL (ref 8.4–10.5)
CO2: 29 meq/L (ref 19–32)
CREATININE: 1.4 mg/dL (ref 0.4–1.5)
Chloride: 102 mEq/L (ref 96–112)
GFR: 54.2 mL/min — ABNORMAL LOW (ref >60.00)
Glucose, Bld: 103 mg/dL — ABNORMAL HIGH (ref 70–99)
Potassium: 4.6 mEq/L (ref 3.5–5.1)
Sodium: 137 mEq/L (ref 135–145)

## 2014-05-06 LAB — MAGNESIUM: Magnesium: 2.1 mg/dL (ref 1.5–2.5)

## 2014-05-06 NOTE — Progress Notes (Signed)
[email or web address]      Patient Care Team: Sheela Stack, MD as PCP - General (Endocrinology)   HPI  [name] is a 73 y.o. male Is seen in followup for recurrent ventricular tachycardia. He has been on multiple medications in the past, and underwent catheter ablation in the setting of VT storm September 2012. He had post ablation ventricular tachycardia and was started on amiodarone which had previously not been used because of concurrent hypothyroidism.   Because of recurrent ventricular tachycardia he was transferred to [facility] for incessant VT and underwent an ablation complicated by pericardial perforation and subsequent pericarditis.   He was seen a couple of weeks after the last visit in the emergency room with frequent ventricular ectopy almost in a pattern of accelerated idioventricular rhythm and we put him on mexiletine. This has been associated with a decrease in his ventricular burden from 38% to 6% and a marked improvement in his overall symptom complex.  His energy is improving.  He then had recurrent ventricular tachycardia , which was relatively slow at 110-130 beats per minute and underwent repeat catheter ablation in March 2103 by Dr. Rayann Heman   Ischemic cardiomyopathy with ejection fraction of 25-30% by echo in December 2000 well with multiple wall motion abnormalities ejection fraction 30%   The patient denies chest pain, shortness of breath, nocturnal dyspnea, orthopnea or peripheral edema. There have been no palpitations, lightheadedness or syncope.      Past Medical History  Diagnosis Date  . CAD (coronary artery disease) 1999    Remote anterolater and apical MI, cath 2011 patent stents RCA/LCx  LAD w/o obstruction  . Cardiomyopathy, ischemic     EF 20-25%  echo 2012  . Chronic systolic congestive heart failure   . ICD (implantable cardiac defibrillator) in place     [facility]  . 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 Abdominal aortic aneurysm 2009   Repaired with endovascular stent  . PVD (peripheral vascular disease)     Has occluded innominate vein  . Myocardial infarction   . Hypertension   . Ventricular tachycardia     recurrent slow VT at 100-110 bpm,  Rx mexilitene/sotalol  . Left bundle branch block   . Systolic CHF, chronic   . Arthritis   . Gynecomastia     2/2 spironolactone  . Atrial fibrillation     Past Surgical History  Procedure Laterality Date  . Icd  Feb 2012    Change out with LV lead placed with tunneling from the right  to the left side  . Endovascular stent insertion      for AAA  . Cardiac catheterization      x2  . Pacemaker insertion    . Ventricular ablation surgery      s/p prior EPS and ablation at [facility] 9/12, [facility] 11/12, and most recent at [facility] 08/23/11  . Tonsillectomy      Current Outpatient Prescriptions  Medication Sig Dispense Refill  . ALPRAZolam (XANAX) 0.25 MG tablet Take 0.125-0.25 mg by mouth 4 (four) times daily.    . Ascorbic Acid (VITAMIN C) 500 MG tablet Take 500 mg by mouth daily.      . carvedilol (COREG) 12.5 MG tablet Take 1 tablet (12.5 mg total) by mouth 2 (two) times daily with a meal. Take one tablet by mouth twice daily 60 tablet 11  . Docusate Calcium (STOOL SOFTENER PO) Take 1 capsule by mouth daily.    [name]  furosemide (LASIX) 20 MG tablet Take 20 mg by mouth daily. TAKES 10MG  IN AFTERNOON.(CUTS 20 MG IN HALF)    . levothyroxine (SYNTHROID, LEVOTHROID) 88 MCG tablet Take 88-132 mcg by mouth daily. Take 1.5 tablets on Friday and 1 tablet the rest of the week.    . loratadine (CLARITIN) 10 MG tablet Take 10 mg by mouth daily.    [name] losartan (COZAAR) 50 MG tablet Take 0.5 tablets (25 mg total) by mouth daily. 90 tablet 3  . meclizine (ANTIVERT) 25 MG tablet TAKE 1 TABLET (25 MG TOTAL) BY MOUTH 3 (THREE) TIMES DAILY AS NEEDED. 270 tablet 2  . mexiletine (MEXITIL) 150 MG capsule TAKE 1 CAPSULE THREE TIMES A DAY 270 capsule 1  . Multiple Vitamin (MULTIVITAMIN WITH  MINERALS) TABS tablet Take 1 tablet by mouth daily.    . Omega-3 Fatty Acids (FISH OIL) 1000 MG CAPS Take 1,000 mg by mouth 2 (two) times daily.     . ranitidine (ZANTAC) 150 MG tablet Take 150 mg by mouth every evening.    . rosuvastatin (CRESTOR) 5 MG tablet Take 5 mg by mouth 2 (two) times a week. Every Tuesday and Saturday    . sotalol (BETAPACE) 80 MG tablet Take 80 mg by mouth 2 (two) times daily.    [name] warfarin (COUMADIN) 2.5 MG tablet TAKE AS DIRECTED BY ANTICOAGULATION CLINIC 120 tablet 0   No current facility-administered medications for this visit.    Allergies  Allergen Reactions  . Iohexol      Code: HIVES, Desc: hives w/ itching during cardiac cath '99, mult caths since w/ ? premeds, DR.G.HAYES REQUESTS 13 HR PRE MED//A.C.pt okay w/13 hr prep/mms, Onset Date: [id number]   . Prednisone Other (See Comments)    Hives    Review of Systems negative except from HPI and PMH  Physical Exam BP 120/72 mmHg  Pulse 93  Ht 5\' 8"  (1.727 m)  Wt 173 lb (78.472 kg)  BMI 26.31 kg/m2 Well developed and well nourished in no acute distress HENT normal E scleral and icterus clear Neck Supple JVP 7 with HJR  carotids brisk and full Clear to ausculation  Regular rate and rhythm, no murmurs gallops or rub Soft with active bowel sounds No clubbing cyanosis none Edema Alert and oriented, grossly normal motor and sensory function Skin Warm and Dry  ECG demonstrates AV pacing at a rate consistent left ventricular activation  Assessment and  Plan  Vetnricular tacycardia  SVT  ischmeic cardiomyopathy  HFrEF  Stable and euvolemic  Continue cardiomyoapthy therapy   Euvolemic  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

## 2014-05-06 NOTE — Patient Instructions (Addendum)
Your physician recommends that you continue on your current medications as directed. Please refer to the Current Medication list given to you today.  Lab today: Magnesium level, BMET   Remote monitoring is used to monitor your Pacemaker of ICD from home. This monitoring reduces the number of office visits required to check your device to one time per year. It allows [place] to keep an eye on the functioning of your device to ensure it is working properly. You are scheduled for a device check from home on 08/05/14. You may send your transmission at any time that day. If you have a wireless device, the transmission will be sent automatically. After your physician reviews your transmission, you will receive a postcard with your next transmission date.  Your physician wants you to follow-up in: 1 year with Dr. Caryl Comes.  You will receive a reminder letter in the mail two months in advance. If you don't receive a letter, please call our office to schedule the follow-up appointment.

## 2014-05-09 ENCOUNTER — Encounter (HOSPITAL_COMMUNITY): Payer: Self-pay | Admitting: Internal Medicine

## 2014-05-09 ENCOUNTER — Encounter: Payer: Self-pay | Admitting: Internal Medicine

## 2014-05-13 ENCOUNTER — Ambulatory Visit (INDEPENDENT_AMBULATORY_CARE_PROVIDER_SITE_OTHER): Payer: Medicare Other | Admitting: *Deleted

## 2014-05-13 DIAGNOSIS — Z4502 Encounter for adjustment and management of automatic implantable cardiac defibrillator: Secondary | ICD-10-CM

## 2014-05-13 DIAGNOSIS — I5022 Chronic systolic (congestive) heart failure: Secondary | ICD-10-CM

## 2014-05-13 LAB — MDC_IDC_ENUM_SESS_TYPE_INCLINIC
Battery Remaining Longevity: 1.5
HIGH POWER IMPEDANCE MEASURED VALUE: 53 Ohm
Implantable Pulse Generator Serial Number: 828324
Lead Channel Impedance Value: 350 Ohm
Lead Channel Pacing Threshold Amplitude: 1 V
Lead Channel Pacing Threshold Amplitude: 1.25 V
Lead Channel Sensing Intrinsic Amplitude: 12 mV
Lead Channel Sensing Intrinsic Amplitude: 2.6 mV
Lead Channel Setting Pacing Amplitude: 2.5 V
Lead Channel Setting Pacing Amplitude: 2.5 V
Lead Channel Setting Pacing Pulse Width: 0.5 ms
MDC IDC MSMT LEADCHNL LV IMPEDANCE VALUE: 990 Ohm
MDC IDC MSMT LEADCHNL LV PACING THRESHOLD AMPLITUDE: 0.75 V
MDC IDC MSMT LEADCHNL LV PACING THRESHOLD PULSEWIDTH: 0.5 ms
MDC IDC MSMT LEADCHNL RA IMPEDANCE VALUE: 430 Ohm
MDC IDC MSMT LEADCHNL RA PACING THRESHOLD PULSEWIDTH: 1 ms
MDC IDC MSMT LEADCHNL RV PACING THRESHOLD PULSEWIDTH: 0.5 ms
MDC IDC SET LEADCHNL LV PACING PULSEWIDTH: 0.5 ms
MDC IDC SET LEADCHNL RA PACING AMPLITUDE: 3 V
MDC IDC SET LEADCHNL RV SENSING SENSITIVITY: 0.5 mV
Zone Setting Detection Interval: 310 ms
Zone Setting Detection Interval: 570 ms

## 2014-05-14 ENCOUNTER — Encounter: Payer: Self-pay | Admitting: *Deleted

## 2014-05-14 ENCOUNTER — Encounter: Payer: Self-pay | Admitting: Internal Medicine

## 2014-05-14 NOTE — Progress Notes (Signed)
EPIC Encounter for ICM Monitoring  Patient Name: [name] is a 73 y.o. male Date: 05/14/2014 Primary Care Physican: Sheela Stack, MD Primary Cardiologist: Mare Ferrari Electrophysiologist: Faustino Congress Weight: 167 lbs       In the past month, have you:  1. Gained more than 2 pounds in a day or more than 5 pounds in a week? no  2. Had changes in your medications (with verification of current medications)? no  3. Had more shortness of breath than is usual for you? no  4. Limited your activity because of shortness of breath? no  5. Not been able to sleep because of shortness of breath? no  6. Had increased swelling in your feet or ankles? no  7. Had symptoms of dehydration (dizziness, dry mouth, increased thirst, decreased urine output) no  8. Had changes in sodium restriction? no  9. Been compliant with medication? Yes   ICM trend:   Follow-up plan: ICM clinic phone appointment: 06/17/14  Copy of note sent to patient's primary care physician, primary cardiologist, and device following physician.  Alvis Lemmings, RN, BSN 05/14/2014 5:21 PM

## 2014-05-16 ENCOUNTER — Telehealth: Payer: Self-pay | Admitting: *Deleted

## 2014-05-16 NOTE — Telephone Encounter (Signed)
PT having multiple interventions of ATP since 05/13/14. Pt recalls "not feeling right" at hernia appt on 05/14/14 at about 1:00pm (pt received 14 consecutive ATPs at 1:20pm). Episodes printed for Dr. Caryl Comes to review. Pt & spouse aware if medicinal or programming changes necessary we will contact them.  Pt aware no driving H4FE.

## 2014-05-19 ENCOUNTER — Encounter: Payer: Self-pay | Admitting: Internal Medicine

## 2014-05-20 ENCOUNTER — Encounter: Payer: Self-pay | Admitting: Internal Medicine

## 2014-05-20 ENCOUNTER — Ambulatory Visit (INDEPENDENT_AMBULATORY_CARE_PROVIDER_SITE_OTHER): Payer: Medicare Other | Admitting: Pharmacist

## 2014-05-20 DIAGNOSIS — Z7901 Long term (current) use of anticoagulants: Secondary | ICD-10-CM

## 2014-05-20 DIAGNOSIS — Z5181 Encounter for therapeutic drug level monitoring: Secondary | ICD-10-CM

## 2014-05-20 DIAGNOSIS — I255 Ischemic cardiomyopathy: Secondary | ICD-10-CM

## 2014-05-20 LAB — POCT INR: INR: 2.2

## 2014-05-20 NOTE — Progress Notes (Unsigned)
Review of tracings again suggest taht the rhythms are SVT  On 12/8 6:41 the tachy terminates with premature V that conducts to [facility] and SVT terminates

## 2014-05-23 ENCOUNTER — Other Ambulatory Visit: Payer: Self-pay | Admitting: Cardiology

## 2014-05-29 ENCOUNTER — Other Ambulatory Visit: Payer: Self-pay | Admitting: *Deleted

## 2014-05-29 MED ORDER — ALPRAZOLAM 0.25 MG PO TABS: 0.1250 mg | ORAL_TABLET | Freq: Four times a day (QID) | ORAL | Status: DC

## 2014-05-31 DIAGNOSIS — D649 Anemia, unspecified: Secondary | ICD-10-CM

## 2014-05-31 HISTORY — DX: Anemia, unspecified: D64.9

## 2014-06-07 ENCOUNTER — Other Ambulatory Visit: Payer: Self-pay | Admitting: *Deleted

## 2014-06-07 ENCOUNTER — Telehealth: Payer: Self-pay | Admitting: Cardiology

## 2014-06-07 ENCOUNTER — Telehealth: Payer: Self-pay | Admitting: *Deleted

## 2014-06-07 ENCOUNTER — Other Ambulatory Visit (INDEPENDENT_AMBULATORY_CARE_PROVIDER_SITE_OTHER): Payer: Self-pay | Admitting: Surgery

## 2014-06-07 MED ORDER — ALPRAZOLAM 0.25 MG PO TABS: 0.1250 mg | ORAL_TABLET | Freq: Four times a day (QID) | ORAL | Status: DC

## 2014-06-07 NOTE — H&P (Signed)
[name] 06/07/2014 9:22 AM Location: [facility] Patient #: [id number] DOB: 1941-04-30 Married / Language: [name] / Race: White Male History of Present Illness [name] MD; 06/07/2014 10:25 AM) Patient words: RIH.  The patient is a 74 year old male who presents with an inguinal hernia. Patient sent to me by Dr. Tamala Ser for concern of symptomatic right inguinal hernia. Pleasant active male. History of recurrent ventricular tachycardia. Underwent catheter ablations and has defibrillator. Followed by Drs. Caryl Comes and Taylor in town. Also associated with [facility] Chronically anticoagulated. Ischemic cardiomyopathy EF 25-30%. He does have good exercise tolerance. Could walk 45 minutes a day 5 times a week. Problems twice a day. No major urinary symptoms. History of aortic aneurysm repaired endovascularly years ago. No worsening problems there. Patient noted right groin pain and swelling last month. It is become more painful. Getting the point where he can do his daily walks. Difficult to sit. He comes today with his wife. No abdominal surgery. No prior hernia surgeries. CT scan and physical exam concerning for small bowel within right inguinal hernia. Therefore sent for surgical evaluation. Other Problems [name], CMA; 06/07/2014 9:22 AM) Arthritis Atrial Fibrillation Congestive Heart Failure Diverticulosis High blood pressure Hypercholesterolemia Myocardial infarction Thyroid Disease  Past Surgical History [name], [name]; 06/07/2014 9:22 AM) Oral Surgery Tonsillectomy  Diagnostic Studies History [name], CMA; 06/07/2014 9:22 AM) Colonoscopy never  Allergies [name], CMA; 06/07/2014 9:23 AM) Iohexol *DIAGNOSTIC PRODUCTS* PrednisoLONE *CORTICOSTEROIDS*  Medication History [name], CMA; 06/07/2014 9:23 AM) Hydrocodone-Acetaminophen (5-325MG  Tablet, Oral) Active. ALPRAZolam  (0.25MG  Tablet, Oral) Active. TraMADol HCl (50MG  Tablet, Oral) Active. Azithromycin (500MG  Tablet, Oral) Active. Carvedilol (12.5MG  Tablet, Oral) Active. Carvedilol (6.25MG  Tablet, Oral) Active. Crestor (5MG  Tablet, Oral) Active. Furosemide (20MG  Tablet, Oral) Active. Levothyroxine Sodium (88MCG Tablet, Oral) Active. Losartan Potassium (50MG  Tablet, Oral) Active. Meclizine HCl (25MG  Tablet, Oral) Active. Mexiletine HCl (150MG  Capsule, Oral) Active. Sotalol HCl (80MG  Tablet, Oral) Active. Warfarin Sodium (2.5MG  Tablet, Oral) Active.  Social History [name], [place]; 06/07/2014 9:22 AM) Alcohol use Recently quit alcohol use. No caffeine use No drug use Tobacco use Former smoker.  Family History [name], [place]; 06/07/2014 9:22 AM) Breast Cancer Sister. Heart Disease Father, Mother, Sister. Heart disease in male family member before age 31 Hypertension Father, Mother.     Review of Systems [name] CMA; 06/07/2014 9:22 AM) General Present- Fatigue. Not Present- Appetite Loss, Chills, Fever, Night Sweats, Weight Gain and Weight Loss. HEENT Not Present- Earache, Hearing Loss, Hoarseness, Nose Bleed, Oral Ulcers, Ringing in the Ears, Seasonal Allergies, Sinus Pain, Sore Throat, Visual Disturbances, Wears glasses/contact lenses and Yellow Eyes. Respiratory Not Present- Bloody sputum, Chronic Cough, Difficulty Breathing, Snoring and Wheezing. Cardiovascular Not Present- Chest Pain, Difficulty Breathing Lying Down, Leg Cramps, Palpitations, Rapid Heart Rate, Shortness of Breath and Swelling of Extremities. Male Genitourinary Present- Urgency. Not Present- Blood in Urine, Change in Urinary Stream, Frequency, Impotence, Nocturia, Painful Urination and Urine Leakage. Musculoskeletal Present- Muscle Weakness. Not Present- Back Pain, Joint Pain, Joint Stiffness, Muscle Pain and Swelling of Extremities. Neurological Present- Headaches. Not Present- Decreased  Memory, Fainting, Numbness, Seizures, Tingling, Tremor, Trouble walking and Weakness. Endocrine Present- Cold Intolerance. Not Present- Excessive Hunger, Hair Changes, Heat Intolerance, Hot flashes and New Diabetes. Hematology Not Present- Easy Bruising, Excessive bleeding, Gland problems, HIV and Persistent Infections.  Vitals [name] CMA; 06/07/2014 9:24 AM) 06/07/2014 9:23 AM Weight: 175.5 lb Height: 68in Body Surface Area: 1.95 m Body Mass Index: 26.68 kg/m  Temp.: 97.29F  Pulse: 70 (Regular)  BP: 118/60 (Sitting, Left Arm, Standard)     Physical Exam [name] MD; 06/07/2014 10:05 AM)  General Mental Status-Alert. General Appearance-Not in acute distress, Not Sickly. Orientation-Oriented X3. Hydration-Well hydrated. Voice-Normal. Note: Moves slowly but steadily.   Integumentary Global Assessment Upon inspection and palpation of skin surfaces of the - Axillae: non-tender, no inflammation or ulceration, no drainage. and Distribution of scalp and body hair is normal. General Characteristics Temperature - normal warmth is noted.  Head and Neck Head-normocephalic, atraumatic with no lesions or palpable masses. Face Global Assessment - atraumatic, no absence of expression. Neck Global Assessment - no abnormal movements, no bruit auscultated on the right, no bruit auscultated on the left, no decreased range of motion, non-tender. Trachea-midline. Thyroid Gland Characteristics - non-tender.  Eye Eyeball - Left-Extraocular movements intact, No Nystagmus. Eyeball - Right-Extraocular movements intact, No Nystagmus. Cornea - Left-No Hazy. Cornea - Right-No Hazy. Sclera/Conjunctiva - Left-No scleral icterus, No Discharge. Sclera/Conjunctiva - Right-No scleral icterus, No Discharge. Pupil - Left-Direct reaction to light normal. Pupil - Right-Direct reaction to light normal.  ENMT Ears Pinna - Left - no drainage  observed, no generalized tenderness observed. Right - no drainage observed, no generalized tenderness observed. Nose and Sinuses External Inspection of the Nose - no destructive lesion observed. Inspection of the nares - Left - quiet respiration. Right - quiet respiration. Mouth and Throat Lips - Upper Lip - no fissures observed, no pallor noted. Lower Lip - no fissures observed, no pallor noted. Nasopharynx - no discharge present. Oral Cavity/Oropharynx - Tongue - no dryness observed. Oral Mucosa - no cyanosis observed. Hypopharynx - no evidence of airway distress observed.  Chest and Lung Exam Inspection Movements - Normal and Symmetrical. Accessory muscles - No use of accessory muscles in breathing. Palpation Palpation of the chest reveals - Non-tender. Auscultation Breath sounds - Normal and Clear.  Cardiovascular Auscultation Rhythm - Regular. Murmurs & Other Heart Sounds - Auscultation of the heart reveals - No Murmurs and No Systolic Clicks.  Abdomen Inspection Inspection of the abdomen reveals - No Visible peristalsis and No Abnormal pulsations. Umbilicus - No Bleeding, No Urine drainage. Palpation/Percussion Palpation and Percussion of the abdomen reveal - Soft, Non Tender, No Rebound tenderness, No Rigidity (guarding) and No Cutaneous hyperesthesia. Note: Soft and flat. No inguinal hernias. No incisions.   Male Genitourinary Sexual Maturity Tanner 5 - Adult hair pattern and Adult penile size and shape. Note: Uncircumcised male. Some testicular atrophy left greater than right. Obvious right inguinal hernia. Sensitive but reducible. Small impulse on the left side suspicious for left inguinal hernia.   Peripheral Vascular Upper Extremity Inspection - Left - No Cyanotic nailbeds, Not Ischemic. Right - No Cyanotic nailbeds, Not Ischemic.  Neurologic Neurologic evaluation reveals -normal attention span and ability to concentrate, able to name objects and repeat phrases.  Appropriate fund of knowledge , normal sensation and normal coordination. Mental Status Affect - not angry, not paranoid. Cranial Nerves-Normal Bilaterally. Gait-Normal.  Neuropsychiatric Mental status exam performed with findings of-able to articulate well with normal speech/language, rate, volume and coherence, thought content normal with ability to perform basic computations and apply abstract reasoning and no evidence of hallucinations, delusions, obsessions or homicidal/suicidal ideation.  Musculoskeletal Global Assessment Spine, Ribs and Pelvis - no instability, subluxation or laxity. Right Upper Extremity - no instability, subluxation or laxity.  Lymphatic Head & Neck  General Head & Neck Lymphatics: Bilateral - Description - No Localized lymphadenopathy. Axillary  General Axillary Region: Bilateral - Description - No Localized lymphadenopathy. Femoral & Inguinal  Generalized Femoral & Inguinal Lymphatics: Left - Description - No Localized lymphadenopathy. Right - Description - No Localized lymphadenopathy.    Assessment & Plan [name] MD; 06/07/2014 10:26 AM)  BILATERAL INGUINAL HERNIA WITHOUT OBSTRUCTION OR GANGRENE, RECURRENCE NOT SPECIFIED (550.92  K40.20) Impression: Right greater than left inguinal hernias based on exam and CT scan. Right very sensitive with small bowel within [facility].  Think the patient would benefit from surgical repair. We'll plan laparoscopic approach given bilateral nature. He has pretty good quality of life and this is affecting it. Good exercise tolerance. Suspect he should tolerate this relatively well.  He is fully anticoagulated. Would like cardiac clearance to make sure it safe to transition off warfarin perioperatively try and minimize risk of stroke or attack and significant bleeding around the time of surgery. Also we plan to help manage his ICD defibrillator. Most likely can use magnet or [employer] can hold.  Current  Plans Schedule for Surgery Pt Education - [facility] Hernia Post-Op HCI (Jaivian Battaglini): discussed with patient and provided information. Pt Education - [facility] Pain Control (Laneka Mcgrory) Pt Education - [facility] Good Bowel Health (Capria Cartaya) The anatomy & physiology of the abdominal wall and pelvic floor was discussed. The pathophysiology of hernias in the inguinal and pelvic region was discussed. Natural history risks such as progressive enlargement, pain, incarceration, and strangulation was discussed. Contributors to complications such as smoking, obesity, diabetes, prior surgery, etc were discussed.  I feel the risks of no intervention will lead to serious problems that outweigh the operative risks; therefore, I recommended surgery to reduce and repair the hernia. I explained laparoscopic techniques with possible need for an open approach. I noted usual use of mesh to patch and/or buttress hernia repair  Risks such as bleeding, infection, abscess, need for further treatment, heart attack, death, and other risks were discussed. I noted a good likelihood this will help address the problem. Goals of post-operative recovery were discussed as well. Possibility that this will not correct all symptoms was explained. I stressed the importance of low-impact activity, aggressive pain control, avoiding constipation, & not pushing through pain to minimize risk of post-operative chronic pain or injury. Possibility of reherniation was discussed. We will work to minimize complications.  An educational handout further explaining the pathology & treatment options was given as well. Questions were answered. The patient expresses understanding & wishes to proceed with surgery.

## 2014-06-07 NOTE — Telephone Encounter (Signed)
Patient requests a [facility] Thanks, MI

## 2014-06-07 NOTE — Telephone Encounter (Signed)
New msg         Please contact Elmo Putt at Dr. Clyda Greener office [phone].   Faxing clearance request today for pt.   Pt would like for the office to speak with [place] first.

## 2014-06-07 NOTE — Telephone Encounter (Signed)
Patient needing bilateral inguinal hernia repair Needs surgical clearance and Warfarin instructions Will forward to  Dr. Mare Ferrari for review

## 2014-06-08 NOTE — Telephone Encounter (Signed)
The patient is cleared for hernia surgery from cardiac standpoint. He has severe LV dysfunction so need to avoid over-hydration in peri-operative period. It would be preferable to have surgery done at [facility] where cardiology backup is readily accessible. He will need the defibrillator function turned off pre-op by the [facility]. Jude rep. Keep pacemaker function turned on. Okay to stop warfarin 5 days before  Surgery and use lovenox bridging per our coumadin clinic.

## 2014-06-11 NOTE — Telephone Encounter (Signed)
Spoke with [name] and faxed  Will forward to Coumadin Clinic

## 2014-06-14 ENCOUNTER — Telehealth: Payer: Self-pay | Admitting: Cardiology

## 2014-06-14 ENCOUNTER — Ambulatory Visit (INDEPENDENT_AMBULATORY_CARE_PROVIDER_SITE_OTHER): Payer: Medicare Other | Admitting: Pharmacist

## 2014-06-14 DIAGNOSIS — I255 Ischemic cardiomyopathy: Secondary | ICD-10-CM

## 2014-06-14 DIAGNOSIS — Z5181 Encounter for therapeutic drug level monitoring: Secondary | ICD-10-CM

## 2014-06-14 DIAGNOSIS — I5022 Chronic systolic (congestive) heart failure: Secondary | ICD-10-CM

## 2014-06-14 DIAGNOSIS — Z7901 Long term (current) use of anticoagulants: Secondary | ICD-10-CM

## 2014-06-14 LAB — POCT INR: INR: 2.4

## 2014-06-14 MED ORDER — ENOXAPARIN SODIUM 40 MG/0.4ML ~~LOC~~ SOLN: 80.0000 mg | Freq: Two times a day (BID) | SUBCUTANEOUS | Status: DC

## 2014-06-14 NOTE — Telephone Encounter (Signed)
Pt coming into office today 06/14/14 for Lovenox bridging instructions.

## 2014-06-14 NOTE — Patient Instructions (Signed)
06/14/14 Take your last dosage of Coumadin prior to surgery on 06/20/14. 06/15/14 No Coumadin, No Lovenox. 06/16/14 Start taking Lovenox 80mg  once in the am, and Lovenox 80mg  once in the pm. 06/17/14 Take Lovenox 80mg  once in the am, and Lovenox 80mg  once in the pm. 06/18/14 Take Lovenox 80mg  once in the am, and Lovenox 80mg  once in the pm. 06/19/14 Take your last dosage of Lovenox 80mg  once in the am, No Lovenox in the pm prior to procedure on 06/20/14.

## 2014-06-15 ENCOUNTER — Emergency Department (HOSPITAL_COMMUNITY)
Admission: EM | Admit: 2014-06-15 | Discharge: 2014-06-15 | Disposition: A | Discharge status: Home / Self Care | Payer: Medicare Other | Attending: Emergency Medicine | Admitting: Emergency Medicine

## 2014-06-15 ENCOUNTER — Emergency Department (HOSPITAL_COMMUNITY): Payer: Medicare Other

## 2014-06-15 ENCOUNTER — Encounter (HOSPITAL_COMMUNITY): Payer: Self-pay | Admitting: *Deleted

## 2014-06-15 ENCOUNTER — Encounter: Payer: Self-pay | Admitting: Internal Medicine

## 2014-06-15 ENCOUNTER — Other Ambulatory Visit: Payer: Self-pay

## 2014-06-15 DIAGNOSIS — Z8739 Personal history of other diseases of the musculoskeletal system and connective tissue: Secondary | ICD-10-CM | POA: Insufficient documentation

## 2014-06-15 DIAGNOSIS — I10 Essential (primary) hypertension: Secondary | ICD-10-CM | POA: Diagnosis not present

## 2014-06-15 DIAGNOSIS — R0602 Shortness of breath: Secondary | ICD-10-CM | POA: Insufficient documentation

## 2014-06-15 DIAGNOSIS — Z87891 Personal history of nicotine dependence: Secondary | ICD-10-CM | POA: Diagnosis not present

## 2014-06-15 DIAGNOSIS — I2510 Atherosclerotic heart disease of native coronary artery without angina pectoris: Secondary | ICD-10-CM | POA: Diagnosis not present

## 2014-06-15 DIAGNOSIS — E0590 Thyrotoxicosis, unspecified without thyrotoxic crisis or storm: Secondary | ICD-10-CM | POA: Insufficient documentation

## 2014-06-15 DIAGNOSIS — I252 Old myocardial infarction: Secondary | ICD-10-CM | POA: Insufficient documentation

## 2014-06-15 DIAGNOSIS — I4891 Unspecified atrial fibrillation: Secondary | ICD-10-CM | POA: Insufficient documentation

## 2014-06-15 DIAGNOSIS — Z87438 Personal history of other diseases of male genital organs: Secondary | ICD-10-CM | POA: Diagnosis not present

## 2014-06-15 DIAGNOSIS — Z79899 Other long term (current) drug therapy: Secondary | ICD-10-CM | POA: Insufficient documentation

## 2014-06-15 DIAGNOSIS — E8779 Other fluid overload: Secondary | ICD-10-CM | POA: Diagnosis not present

## 2014-06-15 DIAGNOSIS — Z95810 Presence of automatic (implantable) cardiac defibrillator: Secondary | ICD-10-CM | POA: Insufficient documentation

## 2014-06-15 DIAGNOSIS — Z7901 Long term (current) use of anticoagulants: Secondary | ICD-10-CM | POA: Diagnosis not present

## 2014-06-15 DIAGNOSIS — I5022 Chronic systolic (congestive) heart failure: Secondary | ICD-10-CM | POA: Insufficient documentation

## 2014-06-15 LAB — CBC WITH DIFFERENTIAL/PLATELET
BASOS PCT: 0 % (ref 0–1)
Basophils Absolute: 0.1 10*3/uL (ref 0.0–0.1)
EOS PCT: 1 % (ref 0–5)
Eosinophils Absolute: 0.1 10*3/uL (ref 0.0–0.7)
HCT: 42.3 % (ref 39.0–52.0)
Hemoglobin: 14.5 g/dL (ref 13.0–17.0)
LYMPHS ABS: 1.4 10*3/uL (ref 0.7–4.0)
Lymphocytes Relative: 13 % (ref 12–46)
MCH: 31.7 pg (ref 26.0–34.0)
MCHC: 34.3 g/dL (ref 30.0–36.0)
MCV: 92.4 fL (ref 78.0–100.0)
Monocytes Absolute: 1 10*3/uL (ref 0.1–1.0)
Monocytes Relative: 9 % (ref 3–12)
Neutro Abs: 8.8 10*3/uL — ABNORMAL HIGH (ref 1.7–7.7)
Neutrophils Relative %: 77 % (ref 43–77)
PLATELETS: 245 10*3/uL (ref 150–400)
RBC: 4.58 MIL/uL (ref 4.22–5.81)
RDW: 12.5 % (ref 11.5–15.5)
WBC: 11.4 10*3/uL — ABNORMAL HIGH (ref 4.0–10.5)

## 2014-06-15 LAB — BASIC METABOLIC PANEL
Anion gap: 7 (ref 5–15)
BUN: 15 mg/dL (ref 6–23)
CALCIUM: 9.3 mg/dL (ref 8.4–10.5)
CHLORIDE: 99 meq/L (ref 96–112)
CO2: 27 mmol/L (ref 19–32)
CREATININE: 1.3 mg/dL (ref 0.50–1.35)
GFR calc non Af Amer: 53 mL/min — ABNORMAL LOW (ref >90)
GFR, EST AFRICAN AMERICAN: 62 mL/min — AB (ref >90)
GLUCOSE: 126 mg/dL — AB (ref 70–99)
POTASSIUM: 4.5 mmol/L (ref 3.5–5.1)
Sodium: 133 mmol/L — ABNORMAL LOW (ref 135–145)

## 2014-06-15 LAB — I-STAT TROPONIN, ED: Troponin i, poc: 0.02 ng/mL (ref 0.00–0.08)

## 2014-06-15 LAB — PROTIME-INR
INR: 1.98 — AB (ref 0.00–1.49)
Prothrombin Time: 22.7 seconds — ABNORMAL HIGH (ref 11.6–15.2)

## 2014-06-15 LAB — BRAIN NATRIURETIC PEPTIDE: B Natriuretic Peptide: 1001.8 pg/mL — ABNORMAL HIGH (ref 0.0–100.0)

## 2014-06-15 LAB — I-STAT CG4 LACTIC ACID, ED: LACTIC ACID, VENOUS: 1.2 mmol/L (ref 0.5–2.2)

## 2014-06-15 MED ORDER — FUROSEMIDE 20 MG PO TABS: 40.0000 mg | ORAL_TABLET | Freq: Two times a day (BID) | ORAL | Status: DC

## 2014-06-15 MED ORDER — FUROSEMIDE 10 MG/ML IJ SOLN
20.0000 mg | Freq: Once | INTRAMUSCULAR | Status: AC
  Administered 2014-06-15: 20 mg via INTRAVENOUS
  Filled 2014-06-15: qty 2

## 2014-06-15 NOTE — ED Notes (Signed)
Pt oxygen level was 96-97, while being ambulated

## 2014-06-15 NOTE — ED Provider Notes (Addendum)
CSN: [id number]     Arrival date & time 06/15/14  2423 History  This chart was scribed for Everlene Balls, MD by Marlowe Kays, ED Scribe. This patient was seen in room D33C/D33C and the patient's care was started at 1:02 AM.  Chief Complaint  Patient presents with  . Shortness of Breath  . Wheezing   HPI  HPI Comments:  [name] is a 74 y.o. male with PMH of chronic systolic CHF brought in by EMS, who presents to the Emergency Department complaining of SOB and wheezing that began approximately two hours ago. He reports he felt as if his heart rate was increased as well. He was given one Nitroglycerin tablet en route per EMS and placed on CPAP. He has not done anything to treat his symptoms. Denies modifying factors. Denies CP, orthopnea and leg swelling. PMH of CAD, cadiomyopathy, AAA, PVD, MI, HTN, ventricular tachycardia, LBBB, arthritis and atrial fibrillation.  Past Medical History  Diagnosis Date  . CAD (coronary artery disease) 1999    Remote anterolater and apical MI, cath 2011 patent stents RCA/LCx  LAD w/o obstruction  . Cardiomyopathy, ischemic     EF 20-25%  echo 2012  . Chronic systolic congestive heart failure   . ICD (implantable cardiac defibrillator) in place     [place] [facility]  . 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 Abdominal aortic aneurysm 2009    Repaired with endovascular stent  . PVD (peripheral vascular disease)     Has occluded innominate vein  . Myocardial infarction   . Hypertension   . Ventricular tachycardia     recurrent slow VT at 100-110 bpm,  Rx mexilitene/sotalol  . Left bundle branch block   . Systolic CHF, chronic   . Arthritis   . Gynecomastia     2/2 spironolactone  . Atrial fibrillation    Past Surgical History  Procedure Laterality Date  . Icd  Feb 2012    Change out with LV lead placed with tunneling from the right  to the left side  . Endovascular stent insertion      for AAA  . Cardiac catheterization      x2  . Pacemaker  insertion    . Ventricular ablation surgery      s/p prior EPS and ablation at [facility] 9/12, [facility] 11/12, and most recent at [facility] 08/23/11  . Tonsillectomy    . V-tach ablation N/A 08/24/2011    Procedure: V-TACH ABLATION;  Surgeon: Thompson Grayer, MD;  Location: [facility] CATH LAB;  Service: Cardiovascular;  Laterality: N/A;   Family History  Problem Relation Age of Onset  . Stroke Mother   . Heart attack Mother   . Heart disease Mother   . Hyperlipidemia Mother   . Hypertension Mother   . Heart attack Father   . Heart disease Father   . Hyperlipidemia Father   . Hypertension Father   . Heart disease Sister   . Hyperlipidemia Sister   . Hypertension Brother    History  Substance Use Topics  . Smoking status: Former Smoker    Quit date: 01/07/1994  . Smokeless tobacco: Former Systems developer    Quit date: 06/01/1987  . Alcohol Use: Yes     Comment: very rare    Review of Systems  Respiratory: Positive for shortness of breath and wheezing.   Cardiovascular: Negative for chest pain and leg swelling.  All other systems reviewed and are negative.   Allergies  Iohexol and Prednisone  Home Medications   Prior to Admission medications   Medication Sig Start Date End Date Taking? Authorizing Provider  ALPRAZolam (XANAX) 0.25 MG tablet Take 0.5-1 tablets (0.125-0.25 mg total) by mouth 4 (four) times daily. 06/07/14  Yes Darlin Coco, MD  Ascorbic Acid (VITAMIN C) 500 MG tablet Take 500 mg by mouth daily.     Yes Historical Provider, MD  carvedilol (COREG) 12.5 MG tablet Take 1 tablet (12.5 mg total) by mouth 2 (two) times daily with a meal. Take one tablet by mouth twice daily 04/01/14  Yes Darlin Coco, MD  Docusate Calcium (STOOL SOFTENER PO) Take 1 capsule by mouth daily.   Yes Historical Provider, MD  furosemide (LASIX) 20 MG tablet Take 20 mg by mouth daily. TAKES 10MG  IN AFTERNOON.(CUTS 20 MG IN HALF) 01/20/14  Yes Jerlyn Ly, MD  levothyroxine (SYNTHROID, LEVOTHROID) 88 MCG  tablet Take 88-132 mcg by mouth daily. Take 1.5 tablets on Friday and 1 tablet the rest of the week.   Yes Historical Provider, MD  loratadine (CLARITIN) 10 MG tablet Take 10 mg by mouth daily.   Yes Historical Provider, MD  losartan (COZAAR) 50 MG tablet Take 0.5 tablets (25 mg total) by mouth daily. 03/16/14  Yes Darlin Coco, MD  meclizine (ANTIVERT) 25 MG tablet TAKE 1 TABLET (25 MG TOTAL) BY MOUTH 3 (THREE) TIMES DAILY AS NEEDED. 01/23/14  Yes Darlin Coco, MD  mexiletine (MEXITIL) 150 MG capsule TAKE 1 CAPSULE THREE TIMES A DAY 04/17/14  Yes Darlin Coco, MD  Multiple Vitamin (MULTIVITAMIN WITH MINERALS) TABS tablet Take 1 tablet by mouth daily.   Yes Historical Provider, MD  Omega-3 Fatty Acids (FISH OIL) 1000 MG CAPS Take 1,000 mg by mouth 2 (two) times daily.    Yes Historical Provider, MD  ranitidine (ZANTAC) 150 MG tablet Take 150 mg by mouth every evening.   Yes Historical Provider, MD  sotalol (BETAPACE) 80 MG tablet Take 80 mg by mouth 2 (two) times daily.   Yes Historical Provider, MD  warfarin (COUMADIN) 2.5 MG tablet Take 2.5-3.75 mg by mouth daily. Take 3.75mg  ONLY on Friday Take 2.5mg  every other day including Sunday.   Yes Historical Provider, MD  rosuvastatin (CRESTOR) 5 MG tablet Take 5 mg by mouth 2 (two) times a week. Every Tuesday and Saturday    Historical Provider, MD  warfarin (COUMADIN) 2.5 MG tablet TAKE AS DIRECTED BY ANTICOAGULATION CLINIC Patient not taking: Reported on 06/15/2014 04/01/14   Darlin Coco, MD   BP 117/68 mmHg  Pulse 76  Resp 15  SpO2 97% Physical Exam  Constitutional: He is oriented to person, place, and time. Vital signs are normal. He appears well-developed and well-nourished.  Non-toxic appearance. He does not appear ill. No distress.  HENT:  Head: Normocephalic and atraumatic.  Nose: Nose normal.  Mouth/Throat: Oropharynx is clear and moist. No oropharyngeal exudate.  Eyes: Conjunctivae and EOM are normal. Pupils are equal,  round, and reactive to light. No scleral icterus.  Neck: Normal range of motion. Neck supple. No tracheal deviation, no edema, no erythema and normal range of motion present. No thyroid mass and no thyromegaly present.  Cardiovascular: Normal rate, regular rhythm, S1 normal, S2 normal, normal heart sounds, intact distal pulses and normal pulses.  Exam reveals no gallop and no friction rub.   No murmur heard. Pulses:      Radial pulses are 2+ on the right side, and 2+ on the left side.  Dorsalis pedis pulses are 2+ on the right side, and 2+ on the left side.  Pulmonary/Chest: Effort normal. No respiratory distress. He has wheezes. He has no rhonchi. He has no rales.  Expiratory crackles and wheezes throughout all lung fields.  Abdominal: Soft. Normal appearance and bowel sounds are normal. He exhibits no distension, no ascites and no mass. There is no hepatosplenomegaly. There is no tenderness. There is no rebound, no guarding and no CVA tenderness.  Musculoskeletal: Normal range of motion. He exhibits no edema or tenderness.  Lymphadenopathy:    He has no cervical adenopathy.  Neurological: He is alert and oriented to person, place, and time. He has normal strength. No cranial nerve deficit or sensory deficit. GCS eye subscore is 4. GCS verbal subscore is 5. GCS motor subscore is 6.  Skin: Skin is warm, dry and intact. No petechiae and no rash noted. He is not diaphoretic. No erythema. No pallor.  Psychiatric: He has a normal mood and affect. His behavior is normal. Judgment normal.  Nursing note and vitals reviewed.   ED Course  Procedures (including critical care time) DIAGNOSTIC STUDIES: Oxygen Saturation is 98% on RA, normal by my interpretation.   COORDINATION OF CARE: 1:06 AM- Will order labs and CXR. Pt verbalizes understanding and agrees to plan.  Medications  furosemide (LASIX) injection 20 mg (not administered)    Labs Review Labs Reviewed  CBC WITH DIFFERENTIAL -  Abnormal; Notable for the following:    WBC 11.4 (*)    Neutro Abs 8.8 (*)    All other components within normal limits  PROTIME-INR - Abnormal; Notable for the following:    Prothrombin Time 22.7 (*)    INR 1.98 (*)    All other components within normal limits  BASIC METABOLIC PANEL - Abnormal; Notable for the following:    Sodium 133 (*)    Glucose, Bld 126 (*)    GFR calc non Af Amer 53 (*)    GFR calc Af Amer 62 (*)    All other components within normal limits  BRAIN NATRIURETIC PEPTIDE - Abnormal; Notable for the following:    B Natriuretic Peptide 1001.8 (*)    All other components within normal limits  I-STAT CG4 LACTIC ACID, ED  Randolm Idol, ED    Imaging Review Dg Chest 2 View  06/15/2014   CLINICAL DATA:  Initial evaluation for shortness of breath.  Acute.  EXAM: CHEST  2 VIEW  COMPARISON:  Prior study from 01/20/2014  FINDINGS: Left-sided transvenous pacemaker/AICD is stable. Cardiomegaly unchanged. Mediastinal silhouette within normal limits. Tortuosity of the intrathoracic aorta noted.  Lungs are normally inflated. There is increased pulmonary vascular congestion with indistinctness of the interstitial markings, compatible with diffuse interstitial edema. No definite focal infiltrates. No pleural effusion. No pneumothorax.  No acute osseus abnormality.  IMPRESSION: Stable cardiomegaly with findings most consistent with mild to moderate diffuse pulmonary interstitial edema.   Electronically Signed   By: Jeannine Boga M.D.   On: 06/15/2014 02:21     EKG Interpretation None     MUSE NOT WORKING: LBBB, 87, no scarbossa MDM   Final diagnoses:  SOB (shortness of breath)   Patient since emergency department for shortness of breath, auscultation his lungs reveals crackles and chest x-ray shows fluid buildup. He was given 20 mg of IV Lasix which doubles his home regimen. I consult to cardiology who suggests this patient get a walking pulse ox which was normal,  greater than 95% on room  air. He suggested for the patient to take 40 mg Lasix twice a day for 7 days and follow-up with his primary cardiologist within 3 days. I only felt comfortable giving the patient this prescription for 5 days and gave him strict return precautions. His vital signs remain within his normal limits and he is safe for discharge.   I personally performed the services described in this documentation, which was scribed in my presence. The recorded information has been reviewed and is accurate.    Everlene Balls, MD 06/15/14 0254  Everlene Balls, MD 06/15/14 [phone]

## 2014-06-15 NOTE — Progress Notes (Signed)
0330 Charted under wrong patient, deleted entry

## 2014-06-15 NOTE — ED Notes (Signed)
Pt arrives from home via [facility]. Pt c/o of shortness of breath and wheezing. Pt was 85% RA when EMS arrived. Pt placed on CPAP. Pt has a productive cough.

## 2014-06-15 NOTE — Discharge Instructions (Signed)
Heart Failure [name], you were seen today for shortness of breath, you had fluid buildup in your lungs. Begin taking Lasix 40 mg twice a day for 5 days. He can then go back to her normal regimen. Follow-up with your cardiologist within 3 days for continued management and possible clearance for your hernia surgery. If any of her symptoms worsen come back to the emergency department immediately. Thank you. Heart failure means your heart has trouble pumping blood. This makes it hard for your body to work well. Heart failure is usually a long-term (chronic) condition. You must take good care of yourself and follow your doctor's treatment plan. HOME CARE  Take your heart medicine as told by your doctor.  Do not stop taking medicine unless your doctor tells you to.  Do not skip any dose of medicine.  Refill your medicines before they run out.  Take other medicines only as told by your doctor or pharmacist.  Stay active if told by your doctor. The elderly and people with severe heart failure should talk with a doctor about physical activity.  Eat heart-healthy foods. Choose foods that are without trans fat and are low in saturated fat, cholesterol, and salt (sodium). This includes fresh or frozen fruits and vegetables, fish, lean meats, fat-free or low-fat dairy foods, whole grains, and high-fiber foods. Lentils and dried peas and beans (legumes) are also good choices.  Limit salt if told by your doctor.  Cook in a healthy way. Roast, grill, broil, bake, poach, steam, or stir-fry foods.  Limit fluids as told by your doctor.  Weigh yourself every morning. Do this after you pee (urinate) and before you eat breakfast. Write down your weight to give to your doctor.  Take your blood pressure and write it down if your doctor tells you to.  Ask your doctor how to check your pulse. Check your pulse as told.  Lose weight if told by your doctor.  Stop smoking or chewing tobacco. Do not use  gum or patches that help you quit without your doctor's approval.  Schedule and go to doctor visits as told.  Nonpregnant women should have no more than 1 drink a day. Men should have no more than 2 drinks a day. Talk to your doctor about drinking alcohol.  Stop illegal drug use.  Stay current with shots (immunizations).  Manage your health conditions as told by your doctor.  Learn to manage your stress.  Rest when you are tired.  If it is really hot outside:  Avoid intense activities.  Use air conditioning or fans, or get in a cooler place.  Avoid caffeine and alcohol.  Wear loose-fitting, lightweight, and light-colored clothing.  If it is really cold outside:  Avoid intense activities.  Layer your clothing.  Wear mittens or gloves, a hat, and a scarf when going outside.  Avoid alcohol.  Learn about heart failure and get support as needed.  Get help to maintain or improve your quality of life and your ability to care for yourself as needed. GET HELP IF:   You gain 03 lb/1.4 kg or more in 1 day or 05 lb/2.3 kg in a week.  You are more short of breath than usual.  You cannot do your normal activities.  You tire easily.  You cough more than normal, especially with activity.  You have any or more puffiness (swelling) in areas such as your hands, feet, ankles, or belly (abdomen).  You cannot sleep because it is hard to  breathe.  You feel like your heart is beating fast (palpitations).  You get dizzy or light-headed when you stand up. GET HELP RIGHT AWAY IF:   You have trouble breathing.  There is a change in mental status, such as becoming less alert or not being able to focus.  You have chest pain or discomfort.  You faint. MAKE SURE YOU:   Understand these instructions.  Will watch your condition.  Will get help right away if you are not doing well or get worse. Document Released: 02/24/2008 Document Revised: 10/01/2013 Document Reviewed:  07/03/2012 [facility] Patient Information 2015 [place], [place]. This information is not intended to replace advice given to you by your health care provider. Make sure you discuss any questions you have with your health care provider.

## 2014-06-17 ENCOUNTER — Ambulatory Visit (INDEPENDENT_AMBULATORY_CARE_PROVIDER_SITE_OTHER): Payer: Medicare Other | Admitting: *Deleted

## 2014-06-17 DIAGNOSIS — Z95810 Presence of automatic (implantable) cardiac defibrillator: Secondary | ICD-10-CM

## 2014-06-17 DIAGNOSIS — I5023 Acute on chronic systolic (congestive) heart failure: Secondary | ICD-10-CM

## 2014-06-17 NOTE — Telephone Encounter (Signed)
Per Charles--transmission from Friday 06/14/14 was SVT/ A tach - rate of 110. Pt has started his lovenox bridge prior to surgery on Thursday.  I reviewed with Terri Skains recommended I forward this information to Dr Mare Ferrari for further recommendations.  Pt advised I will forward to Dr Mare Ferrari for review.

## 2014-06-17 NOTE — Telephone Encounter (Signed)
Request for surgical clearance:  What type of surgery is being performed? Hernia Repair and Dr. Bobbye Riggs office signed off on it but it was before he had the fluid buid up on Friday night and had to go to the ER.  1. When is this surgery scheduled? Thursday at 3Pm  2. Are there any medications that need to be held prior to surgery and how long? He Already has a brifge  3. Name of physician performing surgery? Dr. Johney [place]  4. What is your office phone and fax number? ------[facility] # [phone] Fax [phone]  Pt wife call requests an appt with Dr. Mare Ferrari Pt was seen in the ER on Friday. Would like to be seen before Surgery. There are Same day appt's on Dr, Sherryl Barters Schedule. I am unable to take them. Please assist.

## 2014-06-17 NOTE — Telephone Encounter (Signed)
Pt was transported to ED late Friday/early Saturday for rapid heart/SOB-pt was given IV lasix in ED. Pt states he lost 3 pounds and was told his fluid was gone. Pt scheduled for hernia repair this Thursday. Pt states he was told to increase lasix from 20/10 schedule  to 40/40 through today Pt was advised to see Dr Mare Ferrari or other cardiologist today prior to hernia surgery on Thursday.  Pt denies chest pain/SOB/weight gain today.

## 2014-06-17 NOTE — Telephone Encounter (Signed)
We should work him in with me tomorrow 06/18/14 for OV EKG and BMET. Keep plans for surgery Thursday.

## 2014-06-18 ENCOUNTER — Ambulatory Visit (INDEPENDENT_AMBULATORY_CARE_PROVIDER_SITE_OTHER): Payer: Medicare Other | Admitting: Cardiology

## 2014-06-18 ENCOUNTER — Other Ambulatory Visit (INDEPENDENT_AMBULATORY_CARE_PROVIDER_SITE_OTHER): Payer: Medicare Other | Admitting: *Deleted

## 2014-06-18 ENCOUNTER — Encounter: Payer: Self-pay | Admitting: Cardiology

## 2014-06-18 VITALS — BP 124/82 | HR 73 | Ht 68.0 in | Wt 168.0 lb

## 2014-06-18 DIAGNOSIS — I5022 Chronic systolic (congestive) heart failure: Secondary | ICD-10-CM

## 2014-06-18 DIAGNOSIS — I255 Ischemic cardiomyopathy: Secondary | ICD-10-CM

## 2014-06-18 DIAGNOSIS — Z7901 Long term (current) use of anticoagulants: Secondary | ICD-10-CM

## 2014-06-18 DIAGNOSIS — Z4502 Encounter for adjustment and management of automatic implantable cardiac defibrillator: Secondary | ICD-10-CM

## 2014-06-18 LAB — BASIC METABOLIC PANEL
BUN: 20 mg/dL (ref 6–23)
CO2: 35 meq/L — AB (ref 19–32)
CREATININE: 1.34 mg/dL (ref 0.40–1.50)
Calcium: 9.7 mg/dL (ref 8.4–10.5)
Chloride: 97 mEq/L (ref 96–112)
GFR: 55.58 mL/min — AB (ref >60.00)
GLUCOSE: 107 mg/dL — AB (ref 70–99)
Potassium: 4.8 mEq/L (ref 3.5–5.1)
Sodium: 135 mEq/L (ref 135–145)

## 2014-06-18 NOTE — Patient Instructions (Signed)
Will obtain labs today and call you with the results (bmet)  Your physician recommends that you continue on your current medications as directed. Please refer to the Current Medication list given to you today.  Your physician recommends that you schedule a follow-up appointment in: 1 month ov

## 2014-06-18 NOTE — Progress Notes (Signed)
[name] Date of Birth:  1940-08-04 [place] [address] [place], [place]  [address] [phone]        Fax   [phone]   History of Present Illness:  This pleasant 74 year old gentleman is seen for a post emergency room office visit.  On January 16 he had been seen in the emergency room for sudden onset of dyspnea which occurred after he had felt that his heart rate was racing to a heart rate of 116.  When he first arrived in the emergency room his oxygen saturation was in the low 80s.  He responded quickly to IV Lasix.  He did not have to be admitted.  He previously had been on a total of 30 mg of Lasix a day.  Presently he is on 40 mg twice a day.  Previously The patient had been  admitted on 01/17/14 and discharged on 01/20/14.  He was admitted after he arrived home from an air plane flight from [place] where he had been on vacation for the previous week.  When the plane landed he had difficulty with increasing dyspnea and his wife became concerned because she could hear his breathing with a rattling noise.  This was similar to her to his last hospitalization and he was admitted with acute pulmonary edema and required brief CPR and closed chest massage.  At that time he was admitted on July 8 and discharged on July 11. He presented with acute pulmonary edema. He required brief CPR by EMS at his home. His wife describes frothy sputum at home which came on very quickly. He has a history of a remote anterior wall myocardial infarction. He has an ischemic cardiomyopathy. He has a left ventricular apical aneurysm. He has had a defibrillator for ventricular tachycardia. He has been followed closely by Dr. Caryl Comes and Dr. Rayann Heman. 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 Subsequently he had recurrent ventricular tachycardia and was transferred to [facility] for incessant VT and underwent a second ablation procedure complicated by  pericardial perforation and pericarditis. He underwent a third catheter VT ablation procedure in March 2013 by Dr. Rayann Heman and since then has felt well. He has not been aware of any palpitations. He has not been aware of any shocks from his ICD. He does have a known ischemic cardiomyopathy with ejection fraction of 25-30% by echocardiogram in February 2012. In autumn 2014 he had some problems with fluid overload [place] responded to increased diuretic and to dietary salt restriction. At the time of this prior admission his echocardiogram on 12/06/13 showed an ejection fraction of 20-25% and there was an apical aneurysm. Since last visit he has been on a higher dose of carvedilol 12.5 mg twice a day and he feels that his exercise tolerance has improved slightly.  He is able to walk 40 minutes without  He has not been having any chest pain.  Current Outpatient Prescriptions  Medication Sig Dispense Refill  . ALPRAZolam (XANAX) 0.25 MG tablet Take 0.5-1 tablets (0.125-0.25 mg total) by mouth 4 (four) times daily. (Patient taking differently: Take 0.125-0.25 mg by mouth as needed for anxiety or sleep. ) 360 tablet 1  . Ascorbic Acid (VITAMIN C) 500 MG tablet Take 500 mg by mouth daily.      . carvedilol (COREG) 12.5 MG tablet Take 1 tablet (12.5 mg total) by mouth 2 (two) times daily  with a meal. Take one tablet by mouth twice daily 60 tablet 11  . Docusate Calcium (STOOL SOFTENER PO) Take 1 capsule by mouth daily.    [name] enoxaparin (LOVENOX) 80 MG/0.8ML injection Inject 80 mg into the skin 2 (two) times daily.     . furosemide (LASIX) 20 MG tablet Take 20 mg by mouth daily. TAKES 10MG  IN AFTERNOON.(CUTS 20 MG IN HALF)    . furosemide (LASIX) 20 MG tablet Take 2 tablets (40 mg total) by mouth 2 (two) times daily. 10 tablet 0  . levothyroxine (SYNTHROID, LEVOTHROID) 88 MCG tablet Take 88-132 mcg by mouth daily. Take 1.5 tablets on Friday and 1 tablet the rest of the week.      . loratadine (CLARITIN) 10 MG tablet Take 10 mg by mouth daily.    [name] losartan (COZAAR) 50 MG tablet Take 0.5 tablets (25 mg total) by mouth daily. 90 tablet 3  . meclizine (ANTIVERT) 25 MG tablet TAKE 1 TABLET (25 MG TOTAL) BY MOUTH 3 (THREE) TIMES DAILY AS NEEDED. 270 tablet 2  . mexiletine (MEXITIL) 150 MG capsule TAKE 1 CAPSULE THREE TIMES A DAY 270 capsule 1  . Multiple Vitamin (MULTIVITAMIN WITH MINERALS) TABS tablet Take 1 tablet by mouth daily.    . Omega-3 Fatty Acids (FISH OIL) 1000 MG CAPS Take 1,000 mg by mouth 2 (two) times daily.     . ranitidine (ZANTAC) 150 MG tablet Take 150 mg by mouth every evening.    . rosuvastatin (CRESTOR) 5 MG tablet Take 5 mg by mouth 2 (two) times a week. Every Tuesday and Saturday    . sotalol (BETAPACE) 80 MG tablet Take 80 mg by mouth 2 (two) times daily.    [name] warfarin (COUMADIN) 2.5 MG tablet Take 2.5-3.75 mg by mouth daily. Take 3.75mg  ONLY on Friday Take 2.5mg  every other day including Sunday.     No current facility-administered medications for this visit.    Allergies  Allergen Reactions  . Iohexol      Code: HIVES, Desc: hives w/ itching during cardiac cath '99, mult caths since w/ ? premeds, DR.G.HAYES REQUESTS 13 HR PRE MED//A.C.pt okay w/13 hr prep/mms, Onset Date: [id number]   . Prednisone Other (See Comments)    Hives    Patient Active Problem List   Diagnosis Date Noted  . Pneumonia 01/18/2014  . Varicose veins of lower extremities with other complications 84/16/6063  . Cardiac arrest 12/05/2013  . SVT (supraventricular tachycardia) 12/05/2013  . Acute respiratory failure 12/05/2013  . Hyperkalemia 12/05/2013  . Acute renal insufficiency 12/05/2013  . Acute on chronic systolic heart failure 01/60/1093  . Encounter for therapeutic drug monitoring 10/10/2013  . Dyslipidemia 02/15/2013  . Coronary artery disease 04/08/2011  . Anxiety 04/08/2011  . HTN (hypertension) 02/02/2011  . Biventricular implantable  cardioverter-defibrillator in situ 11/03/2010  . Sinus bradycardia 11/03/2010  . Chronic anticoagulation 10/05/2010  . Cardiomyopathy, ischemic   . Chronic systolic heart failure   . Ventricular tachycardia   . LBBB 05/21/2010  . 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 ABDOMINAL AORTIC ANEURYSM 02/07/2009    History  Smoking status  . Former Smoker  . Quit date: 01/07/1994  Smokeless tobacco  . Former Systems developer  . Quit date: 06/01/1987    History  Alcohol Use  . Yes    Comment: very rare    Family History  Problem Relation Age of Onset  . Stroke Mother   . Heart attack Mother   . Heart disease Mother   .  Hyperlipidemia Mother   . Hypertension Mother   . Heart attack Father   . Heart disease Father   . Hyperlipidemia Father   . Hypertension Father   . Heart disease Sister   . Hyperlipidemia Sister   . Hypertension Brother     Review of Systems: Constitutional: no fever chills diaphoresis or fatigue or change in weight.  Head and neck: no hearing loss, no epistaxis, no photophobia or visual disturbance. Respiratory: No cough, shortness of breath or wheezing. Cardiovascular: No chest pain peripheral edema, palpitations. Gastrointestinal: He is scheduled for inguinal hernia surgery on January 21 by Dr. Johney Maine Genitourinary: No dysuria, no frequency, no urgency, no nocturia. Musculoskeletal:No arthralgias, no back pain, no gait disturbance or myalgias. Neurological: No dizziness, no headaches, no numbness, no seizures, no syncope, no weakness, no tremors. Hematologic: No lymphadenopathy, no easy bruising. Psychiatric: No confusion, no hallucinations, no sleep disturbance.    Physical Exam: Filed Vitals:   06/18/14 1439  BP: 124/82  Pulse: 73   the general appearance reveals a well-developed well-nourished gentleman in no distress.The head and neck exam reveals pupils equal and reactive.  Extraocular movements are full.  There is no scleral icterus.  The mouth and pharynx are  normal.  The neck is supple.  The carotids reveal no bruits.  The jugular venous pressure is normal.  The  thyroid is not enlarged.  There is no lymphadenopathy.  The chest is clear to percussion and auscultation.  There are no rales or rhonchi.  Expansion of the chest is symmetrical.  The precordium is quiet.  ICD in left upper chest.  The rhythm is regular.  The first heart sound is normal.  The second heart sound is physiologically split.  There is no murmur gallop rub or click.  There is no abnormal lift or heave.  The abdomen is soft and nontender.  The bowel sounds are normal.  The liver and spleen are not enlarged.  There are no abdominal masses.  There are no abdominal bruits.  Extremities reveal good pedal pulses.  There is no phlebitis or edema.  There is no cyanosis or clubbing.  Strength is normal and symmetrical in all extremities.  There is no lateralizing weakness.  There are no sensory deficits.  The skin is warm and dry.  There is no rash.    Assessment / Plan: 1. ischemic heart disease with ischemic cardiomyopathy. Ejection fraction 20-25% with apical aneurysm by echocardiogram 12/06/13  2. 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  3. past history of ventricular tachycardia. He has had 3 separate VT ablation procedures, most recently in March 2013 by Dr. Rayann Heman  4. hypothyroidism followed by Dr. Forde Dandy  5. hypercholesterolemia  6. recent admission 01/17/14-01/20/14 for dyspnea felt to be probable acute bronchitis as well as slight worsening of his chronic systolic heart failure.normal VQ scan 7.  Recent emergency room visit on January 16 4 acute shortness of breath which responded to IV Lasix.  He is now on a higher dose of Lasix 20 mg twice a day. 8.  Bilateral inguinal hernias which are very symptomatic.  He is off his warfarin in anticipation of arthroscopic surgery on January  21.  He is on Lovenox bridging.   Plan: Continue current medication.  Continue carvedilol  12.5 mg twice a day for left ventricular systolic dysfunction and exertional dyspnea on hills. Okay to proceed with surgery as planned.  As  previously noted, the patient will be have to be watched very closely to avoid fluid overload in the perioperative period.  It is preferable to keep him on the dry side.  We are checking lab work today.  For now he will continue current dose of Lasix 40 mg twice a day.  Resume warfarin after surgery.  He already has an appointment to be seen in the Coumadin clinic here next week. I will plan to see him back in one month for follow-up office visit and basal metabolic panel

## 2014-06-19 ENCOUNTER — Encounter (HOSPITAL_COMMUNITY)
Admission: RE | Admit: 2014-06-19 | Discharge: 2014-06-19 | Disposition: A | Discharge status: Home / Self Care | Payer: Medicare Other | Source: Ambulatory Visit | Attending: Surgery | Admitting: Surgery

## 2014-06-19 ENCOUNTER — Encounter (HOSPITAL_COMMUNITY): Payer: Self-pay

## 2014-06-19 ENCOUNTER — Other Ambulatory Visit: Payer: Self-pay | Admitting: *Deleted

## 2014-06-19 DIAGNOSIS — M1990 Unspecified osteoarthritis, unspecified site: Secondary | ICD-10-CM | POA: Diagnosis not present

## 2014-06-19 DIAGNOSIS — K4190 Unilateral femoral hernia, without obstruction or gangrene, not specified as recurrent: Secondary | ICD-10-CM | POA: Diagnosis not present

## 2014-06-19 DIAGNOSIS — I252 Old myocardial infarction: Secondary | ICD-10-CM | POA: Diagnosis not present

## 2014-06-19 DIAGNOSIS — I10 Essential (primary) hypertension: Secondary | ICD-10-CM | POA: Diagnosis not present

## 2014-06-19 DIAGNOSIS — Z79899 Other long term (current) drug therapy: Secondary | ICD-10-CM

## 2014-06-19 DIAGNOSIS — E780 Pure hypercholesterolemia: Secondary | ICD-10-CM | POA: Diagnosis not present

## 2014-06-19 DIAGNOSIS — Z87891 Personal history of nicotine dependence: Secondary | ICD-10-CM | POA: Diagnosis not present

## 2014-06-19 DIAGNOSIS — K4020 Bilateral inguinal hernia, without obstruction or gangrene, not specified as recurrent: Secondary | ICD-10-CM | POA: Diagnosis present

## 2014-06-19 DIAGNOSIS — Z7901 Long term (current) use of anticoagulants: Secondary | ICD-10-CM | POA: Diagnosis not present

## 2014-06-19 DIAGNOSIS — E079 Disorder of thyroid, unspecified: Secondary | ICD-10-CM | POA: Diagnosis not present

## 2014-06-19 HISTORY — DX: Disease of pericardium, unspecified: I31.9

## 2014-06-19 HISTORY — DX: Bilateral inguinal hernia, without obstruction or gangrene, recurrent: K40.21

## 2014-06-19 HISTORY — DX: Gastro-esophageal reflux disease without esophagitis: K21.9

## 2014-06-19 MED ORDER — FUROSEMIDE 20 MG PO TABS: ORAL_TABLET | ORAL | Status: DC

## 2014-06-19 MED ORDER — CEFAZOLIN SODIUM-DEXTROSE 2-3 GM-% IV SOLR
2.0000 g | INTRAVENOUS | Status: AC
  Administered 2014-06-20: 2 g via INTRAVENOUS

## 2014-06-19 NOTE — Pre-Procedure Instructions (Signed)
[name]  06/19/2014   Your procedure is scheduled on: Thursday, June 20, 2014  [facility] Admitting at 12:45 PM.  Call this number if you have problems the morning of surgery: [phone]   Remember:   Do not eat food or drink liquids after midnight.   Take these medicines the morning of surgery with A SIP OF WATER:carvedilol (COREG),   levothyroxine (SYNTHROID, LEVOTHROID), loratadine (CLARITIN), mexiletine (MEXITIL) , sotalol (BETAPACE), if needed: ALPRAZolam [name]) for anxiety, meclizine (ANTIVERT)   Stop taking Aspirin, Coumadin, vitamins, and herbal medications such as Omega-3 Fatty Acids (FISH OIL) . Do not take any NSAIDs ie: Ibuprofen, Advil, Naproxen or any medication containing Aspirin.   Do not wear jewelry, make-up or nail polish.  Do not wear lotions, powders, or perfumes. You may not  wear deodorant.  Do not shave 48 hours prior to surgery. Men may shave face and neck.  Do not bring valuables to the hospital.  [facility] is not responsible for any belongings or valuables.               Contacts, dentures or bridgework may not be worn into surgery.  Leave suitcase in the car. After surgery it may be brought to your room.  For patients admitted to the hospital, discharge time is determined by your treatment team.               Patients discharged the day of surgery will not be allowed to drive home.  Name and phone number of your driver:   Special Instructions:  Special Instructions:Special Instructions: [facility] - Preparing for Surgery  Before surgery, you can play an important role.  Because skin is not sterile, your skin needs to be as free of germs as possible.  You can reduce the number of germs on you skin by washing with CHG (chlorahexidine gluconate) soap before surgery.  CHG is an antiseptic cleaner which kills germs and bonds with the skin to continue killing germs even after washing.  Please DO NOT use if you have an  allergy to CHG or antibacterial soaps.  If your skin becomes reddened/irritated stop using the CHG and inform your nurse when you arrive at Short Stay.  Do not shave (including legs and underarms) for at least 48 hours prior to the first CHG shower.  You may shave your face.  Please follow these instructions carefully:   1.  Shower with CHG Soap the night before surgery and the morning of Surgery.  2.  If you choose to wash your hair, wash your hair first as usual with your normal shampoo.  3.  After you shampoo, rinse your hair and body thoroughly to remove the Shampoo.  4.  Use CHG as you would any other liquid soap.  You can apply chg directly  to the skin and wash gently with scrungie or a clean washcloth.  5.  Apply the CHG Soap to your body ONLY FROM THE NECK DOWN.  Do not use on open wounds or open sores.  Avoid contact with your eyes, ears, mouth and genitals (private parts).  Wash genitals (private parts) with your normal soap.  6.  Wash thoroughly, paying special attention to the area where your surgery will be performed.  7.  Thoroughly rinse your body with warm water from the neck down.  8.  DO NOT shower/wash with your normal soap after using and rinsing off the CHG Soap.  9.  Pat yourself  dry with a clean towel.            10.  Wear clean pajamas.            11.  Place clean sheets on your bed the night of your first shower and do not sleep with pets.  Day of Surgery  Do not apply any lotions/deodorants the morning of surgery.  Please wear clean clothes to the hospital/surgery center.   Please read over the following fact sheets that you were given: Pain Booklet, Coughing and Deep Breathing and Surgical Site Infection Prevention

## 2014-06-19 NOTE — Progress Notes (Signed)
EPIC Encounter for ICM Monitoring  Patient Name: [name] is a 74 y.o. male Date: 06/19/2014 Primary Care Physican: Sheela Stack, MD Primary Cardiologist: Mare Ferrari Electrophysiologist: Caryl Comes Dry Weight: 170 lbs  Bi-V pacing:  98%       In the past month, have you:  1. Gained more than 2 pounds in a day or more than 5 pounds in a week? No. The patient was seen in the ER on 06/15/14 for acute onset of SOB. He was given IV lasix and his po dose was increased to 40 mg BID. He saw Dr. Mare Ferrari yesterday and his dose was not changed. A repeat BMP was done yesterday and is stable. The patient is to continue lasix 40 mg BID and have repeat BMP next week.    2. Had changes in your medications (with verification of current medications)? Yes. As above  3. Had more shortness of breath than is usual for you? Yes. As above.  4. Limited your activity because of shortness of breath? no  5. Not been able to sleep because of shortness of breath? no  6. Had increased swelling in your feet or ankles? no  7. Had symptoms of dehydration (dizziness, dry mouth, increased thirst, decreased urine output) no  8. Had changes in sodium restriction? no  9. Been compliant with medication? Yes   ICM trend:   Follow-up plan: ICM clinic phone appointment: 06/27/14. The patient is pending inguinal hernia surgery tomorrow. I will have him send a transmission in 1 week to follow up on his corvue trends and assess his weights post surgery.   Copy of note sent to patient's primary care physician, primary cardiologist, and device following physician.  Alvis Lemmings, RN, BSN 06/19/2014 3:47 PM

## 2014-06-19 NOTE — Progress Notes (Signed)
Anesthesia PAT Evaluation:  Patient is a 74 year old male scheduled for laparoscopic bilateral inguinal hernia repair tomorrow by Dr. Johney Maine.  History includes former smoker, CAD/anterolateral and apical MI  with RCA and LCx stents '05 and angioplasty '91 (widely patent stents by 04/2009 cath), LV apical aneurysm, ischemic CM, chronic systolic CHF, VT s/p ablation X 3 (second ablation at [facility] complicated by pericardial perforation and pericarditis), s/p 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  He was hospitalized on 12/05/13 for out of hospital cardiac arrest requiring brief CPR in the setting of slow SVT leading to acute respiratory failure.    PCP is Dr. Forde Dandy. EP cardiologist is Dr. Caryl Comes.  Primary cardiologist is Dr. Mare Ferrari, last visit 06/18/14 for ED follow-up (from 06/15/14 for sudden onset of dyspnea associated mild to moderate pulmonary edema responsive to diuresis) and preoperative evaluation.  His note states, "Okay to proceed with surgery as planned. As previously noted, the patient will be have to be watched very closely to avoid fluid overload in the perioperative period. It is preferable to keep him on the dry side. We are checking lab work today. For now he will continue current dose of Lasix 40 mg twice a day. Resume warfarin after surgery."  Meds: Xanax, Vitamin, Coreg, Lovenox, warfarin (on hold), Lasix 40 mg at 6AM and 2 PM, Synthroid, Cozaar, Mexitil, fish oil, Zantac, Crestor, sotoalol. He has been instructed to take his usual 6am dose of Lasix since he has been prone to pulmonary edema and has known systolic CHF with EF 36-62%.  EKG 06/15/14: left BBB.  12/06/13 Echo: - Left ventricle: The cavity size was mildly dilated. Wall thickness was increased in a pattern of mild LVH. Systolic function was severely reduced. The estimated ejection fraction was in the  range of 20% to 25%. There is akinesis of the anteroseptal myocardium. There is aneurysmal deformity of the apical myocardium. Features are consistent with a pseudonormal left ventricular filling pattern, with concomitant abnormal relaxation and increased filling pressure (grade 2 diastolic dysfunction). - Aortic valve: There was mild regurgitation. - Left atrium: The atrium was mildly dilated. - Right atrium: The atrium was mildly dilated. Impressions: Anteroseptal akinesis; apical aneurysm; overall severely reduced LV function; no apical thrombus using contrast; mild biatrial enlargement; mild AI.  05/21/09 cardiac cath: LM with mild to moderate irregularities. 40% distal LM stenosis. LAD mild irregularities with diffuse disease 30-40% of the proximal LAD. Several moderate to small diagonal vessels with mild disease. LCx is a large vessel with proximal/mid widely patent stent. OM1 and OM2 are widely patent. RCA is moderate and dominant. Proximal RCA stent is widely patent. The remaining RCA has minor irregularities. Small PL branch. Severe global LV dysfunction with anterior apical, inferior apical akinesis, EF 20-25%.  06/15/14 CXR: Stable cardiomegaly with findings most consistent with mild to moderate diffuse pulmonary interstitial edema.(Treated with diuresis.)  Labs from 06/15/14 and 06/18/14 noted.  He is on a Lovenox bridge while off Coumadin for surgery. Plan for PT/PTT on the day of surgery, since he will be off Lovenox by then.    Patient without any new CV symptoms since his visit with Dr. Mare Ferrari yesterday.  No ICD discharges in 3 years.  Weight has been stable.  ICD is located in his left chest.  Lungs are clear today.  No pretibial edema.  Heart RRR.  Good mouth opening.    If no  acute CV/CHF symptoms then I would anticipate that he could proceed as planned. Will need to watch his fluid status closely. ICD RX is still pending. He said that Dr. Johney Maine thought he could go  home post-operatively.  I did tell him that there could be a possibility that he will need to stay overnight due to his cardiac history, but that Dr. Johney Maine and his anesthesiologist could determine what was best for him based on his perioperative course.  [facility] Short Stay Center/Anesthesiology Phone [phone] 06/19/2014 10:47 AM

## 2014-06-19 NOTE — Telephone Encounter (Signed)
Advised patient of lab results  

## 2014-06-20 ENCOUNTER — Ambulatory Visit (HOSPITAL_COMMUNITY): Payer: Medicare Other | Admitting: Vascular Surgery

## 2014-06-20 ENCOUNTER — Ambulatory Visit (HOSPITAL_COMMUNITY)
Admission: RE | Admit: 2014-06-20 | Discharge: 2014-06-20 | Disposition: A | Discharge status: Home / Self Care | Payer: Medicare Other | Source: Ambulatory Visit | Attending: Surgery | Admitting: Surgery

## 2014-06-20 ENCOUNTER — Ambulatory Visit (HOSPITAL_COMMUNITY): Payer: Medicare Other | Admitting: Anesthesiology

## 2014-06-20 ENCOUNTER — Encounter (HOSPITAL_COMMUNITY)
Admission: RE | Disposition: A | Discharge status: Home / Self Care | Payer: Self-pay | Source: Ambulatory Visit | Attending: Surgery

## 2014-06-20 ENCOUNTER — Encounter (HOSPITAL_COMMUNITY): Payer: Self-pay | Admitting: *Deleted

## 2014-06-20 DIAGNOSIS — K4190 Unilateral femoral hernia, without obstruction or gangrene, not specified as recurrent: Secondary | ICD-10-CM | POA: Insufficient documentation

## 2014-06-20 DIAGNOSIS — I252 Old myocardial infarction: Secondary | ICD-10-CM | POA: Insufficient documentation

## 2014-06-20 DIAGNOSIS — E780 Pure hypercholesterolemia: Secondary | ICD-10-CM | POA: Insufficient documentation

## 2014-06-20 DIAGNOSIS — I10 Essential (primary) hypertension: Secondary | ICD-10-CM | POA: Insufficient documentation

## 2014-06-20 DIAGNOSIS — K4020 Bilateral inguinal hernia, without obstruction or gangrene, not specified as recurrent: Secondary | ICD-10-CM | POA: Diagnosis not present

## 2014-06-20 DIAGNOSIS — Z87891 Personal history of nicotine dependence: Secondary | ICD-10-CM | POA: Insufficient documentation

## 2014-06-20 DIAGNOSIS — M1990 Unspecified osteoarthritis, unspecified site: Secondary | ICD-10-CM | POA: Insufficient documentation

## 2014-06-20 DIAGNOSIS — E079 Disorder of thyroid, unspecified: Secondary | ICD-10-CM | POA: Insufficient documentation

## 2014-06-20 DIAGNOSIS — Z7901 Long term (current) use of anticoagulants: Secondary | ICD-10-CM | POA: Insufficient documentation

## 2014-06-20 HISTORY — PX: INSERTION OF MESH: SHX5868

## 2014-06-20 HISTORY — PX: INGUINAL HERNIA REPAIR: SHX194

## 2014-06-20 LAB — PROTIME-INR
INR: 1.09 (ref 0.00–1.49)
PROTHROMBIN TIME: 14.2 s (ref 11.6–15.2)

## 2014-06-20 LAB — APTT: aPTT: 31 seconds (ref 24–37)

## 2014-06-20 SURGERY — REPAIR, HERNIA, INGUINAL, BILATERAL, LAPAROSCOPIC
Anesthesia: General | Site: Abdomen

## 2014-06-20 MED ORDER — HYDROMORPHONE HCL 1 MG/ML IJ SOLN: 0.5000 mg | INTRAMUSCULAR | Status: DC | PRN

## 2014-06-20 MED ORDER — ONDANSETRON HCL 4 MG/2ML IJ SOLN: 4.0000 mg | Freq: Four times a day (QID) | INTRAMUSCULAR | Status: DC | PRN

## 2014-06-20 MED ORDER — BUPIVACAINE-EPINEPHRINE (PF) 0.25% -1:200000 IJ SOLN
INTRAMUSCULAR | Status: AC
  Filled 2014-06-20: qty 30

## 2014-06-20 MED ORDER — HYDROMORPHONE HCL 1 MG/ML IJ SOLN
0.2500 mg | INTRAMUSCULAR | Status: DC | PRN
  Administered 2014-06-20 (×2): 0.25 mg via INTRAVENOUS
  Administered 2014-06-20: 0.5 mg via INTRAVENOUS

## 2014-06-20 MED ORDER — PHENOL 1.4 % MT LIQD: 2.0000 | OROMUCOSAL | Status: DC | PRN

## 2014-06-20 MED ORDER — PROMETHAZINE HCL 25 MG/ML IJ SOLN: 6.2500 mg | INTRAMUSCULAR | Status: DC | PRN

## 2014-06-20 MED ORDER — FENTANYL CITRATE 0.05 MG/ML IJ SOLN
INTRAMUSCULAR | Status: AC
  Filled 2014-06-20: qty 5

## 2014-06-20 MED ORDER — LIP MEDEX EX OINT: 1.0000 "application " | TOPICAL_OINTMENT | Freq: Two times a day (BID) | CUTANEOUS | Status: DC

## 2014-06-20 MED ORDER — DIPHENHYDRAMINE HCL 50 MG/ML IJ SOLN: 12.5000 mg | Freq: Four times a day (QID) | INTRAMUSCULAR | Status: DC | PRN

## 2014-06-20 MED ORDER — ETOMIDATE 2 MG/ML IV SOLN
INTRAVENOUS | Status: AC
  Filled 2014-06-20: qty 10

## 2014-06-20 MED ORDER — GLYCOPYRROLATE 0.2 MG/ML IJ SOLN
INTRAMUSCULAR | Status: DC | PRN
  Administered 2014-06-20: 0.6 mg via INTRAVENOUS

## 2014-06-20 MED ORDER — ONDANSETRON HCL 4 MG/2ML IJ SOLN
INTRAMUSCULAR | Status: DC | PRN
  Administered 2014-06-20: 4 mg via INTRAVENOUS

## 2014-06-20 MED ORDER — ONDANSETRON 4 MG PO TBDP: 4.0000 mg | ORAL_TABLET | Freq: Four times a day (QID) | ORAL | Status: DC | PRN

## 2014-06-20 MED ORDER — MAGIC MOUTHWASH: 15.0000 mL | Freq: Four times a day (QID) | ORAL | Status: DC | PRN

## 2014-06-20 MED ORDER — PROPOFOL 10 MG/ML IV BOLUS
INTRAVENOUS | Status: AC
  Filled 2014-06-20: qty 20

## 2014-06-20 MED ORDER — PHENYLEPHRINE HCL 10 MG/ML IJ SOLN
INTRAMUSCULAR | Status: DC | PRN
  Administered 2014-06-20: 120 ug via INTRAVENOUS
  Administered 2014-06-20: 80 ug via INTRAVENOUS
  Administered 2014-06-20: 120 ug via INTRAVENOUS
  Administered 2014-06-20: 80 ug via INTRAVENOUS

## 2014-06-20 MED ORDER — ONDANSETRON 8 MG/NS 50 ML IVPB: 8.0000 mg | Freq: Four times a day (QID) | INTRAVENOUS | Status: DC | PRN

## 2014-06-20 MED ORDER — OXYCODONE HCL 5 MG PO TABS
5.0000 mg | ORAL_TABLET | Freq: Once | ORAL | Status: AC | PRN
  Administered 2014-06-20: 5 mg via ORAL

## 2014-06-20 MED ORDER — OXYCODONE HCL 5 MG PO TABS
ORAL_TABLET | ORAL | Status: AC
  Filled 2014-06-20: qty 1

## 2014-06-20 MED ORDER — BISACODYL 10 MG RE SUPP: 10.0000 mg | Freq: Two times a day (BID) | RECTAL | Status: DC | PRN

## 2014-06-20 MED ORDER — SODIUM CHLORIDE 0.9 % IV SOLN: 250.0000 mL | INTRAVENOUS | Status: DC | PRN

## 2014-06-20 MED ORDER — MIDAZOLAM HCL 2 MG/2ML IJ SOLN
INTRAMUSCULAR | Status: AC
  Filled 2014-06-20: qty 2

## 2014-06-20 MED ORDER — FENTANYL CITRATE 0.05 MG/ML IJ SOLN
INTRAMUSCULAR | Status: DC | PRN
  Administered 2014-06-20: 50 ug via INTRAVENOUS
  Administered 2014-06-20: 100 ug via INTRAVENOUS

## 2014-06-20 MED ORDER — BUPIVACAINE-EPINEPHRINE 0.25% -1:200000 IJ SOLN
INTRAMUSCULAR | Status: DC | PRN
  Administered 2014-06-20: 60 mL
  Administered 2014-06-20: 10 mL

## 2014-06-20 MED ORDER — CEFAZOLIN SODIUM-DEXTROSE 2-3 GM-% IV SOLR
INTRAVENOUS | Status: AC
  Filled 2014-06-20: qty 50

## 2014-06-20 MED ORDER — LIDOCAINE HCL (CARDIAC) 20 MG/ML IV SOLN
INTRAVENOUS | Status: AC
  Filled 2014-06-20: qty 5

## 2014-06-20 MED ORDER — NEOSTIGMINE METHYLSULFATE 10 MG/10ML IV SOLN
INTRAVENOUS | Status: DC | PRN
  Administered 2014-06-20: 4 mg via INTRAVENOUS

## 2014-06-20 MED ORDER — ONDANSETRON HCL 4 MG/2ML IJ SOLN: 4.0000 mg | Freq: Once | INTRAMUSCULAR | Status: DC | PRN

## 2014-06-20 MED ORDER — OXYCODONE HCL 5 MG PO TABS: 5.0000 mg | ORAL_TABLET | ORAL | Status: DC | PRN

## 2014-06-20 MED ORDER — CHLORHEXIDINE GLUCONATE 4 % EX LIQD: 1.0000 "application " | Freq: Once | CUTANEOUS | Status: DC

## 2014-06-20 MED ORDER — OXYCODONE HCL 5 MG/5ML PO SOLN
5.0000 mg | Freq: Once | ORAL | Status: AC | PRN
Start: 2014-06-20 — End: 2014-06-20

## 2014-06-20 MED ORDER — LACTATED RINGERS IV BOLUS (SEPSIS): 1000.0000 mL | Freq: Three times a day (TID) | INTRAVENOUS | Status: DC | PRN

## 2014-06-20 MED ORDER — ACETAMINOPHEN 650 MG RE SUPP: 650.0000 mg | Freq: Four times a day (QID) | RECTAL | Status: DC | PRN

## 2014-06-20 MED ORDER — ACETAMINOPHEN 325 MG PO TABS: 325.0000 mg | ORAL_TABLET | Freq: Four times a day (QID) | ORAL | Status: DC | PRN

## 2014-06-20 MED ORDER — SODIUM CHLORIDE 0.9 % IJ SOLN: 3.0000 mL | INTRAMUSCULAR | Status: DC | PRN

## 2014-06-20 MED ORDER — HYDROMORPHONE HCL 1 MG/ML IJ SOLN
INTRAMUSCULAR | Status: AC
  Filled 2014-06-20: qty 1

## 2014-06-20 MED ORDER — METOPROLOL TARTRATE 1 MG/ML IV SOLN: 5.0000 mg | Freq: Four times a day (QID) | INTRAVENOUS | Status: DC | PRN

## 2014-06-20 MED ORDER — LIDOCAINE HCL (CARDIAC) 20 MG/ML IV SOLN
INTRAVENOUS | Status: DC | PRN
  Administered 2014-06-20: 40 mg via INTRAVENOUS

## 2014-06-20 MED ORDER — SODIUM CHLORIDE 0.9 % IJ SOLN: 3.0000 mL | Freq: Two times a day (BID) | INTRAMUSCULAR | Status: DC

## 2014-06-20 MED ORDER — LACTATED RINGERS IV SOLN
INTRAVENOUS | Status: DC
  Administered 2014-06-20: 15:00:00 via INTRAVENOUS

## 2014-06-20 MED ORDER — ALUM & MAG HYDROXIDE-SIMETH 200-200-20 MG/5ML PO SUSP: 30.0000 mL | Freq: Four times a day (QID) | ORAL | Status: DC | PRN

## 2014-06-20 MED ORDER — LACTATED RINGERS IV SOLN
INTRAVENOUS | Status: DC | PRN
  Administered 2014-06-20: 15:00:00 via INTRAVENOUS

## 2014-06-20 MED ORDER — SODIUM CHLORIDE 0.9 % IR SOLN
Status: DC | PRN
  Administered 2014-06-20: 1000 mL

## 2014-06-20 MED ORDER — PROPOFOL 10 MG/ML IV BOLUS
INTRAVENOUS | Status: DC | PRN
  Administered 2014-06-20: 70 mg via INTRAVENOUS

## 2014-06-20 MED ORDER — MENTHOL 3 MG MT LOZG: 1.0000 | LOZENGE | OROMUCOSAL | Status: DC | PRN

## 2014-06-20 MED ORDER — ROCURONIUM BROMIDE 100 MG/10ML IV SOLN
INTRAVENOUS | Status: DC | PRN
  Administered 2014-06-20: 30 mg via INTRAVENOUS
  Administered 2014-06-20 (×2): 10 mg via INTRAVENOUS

## 2014-06-20 MED ORDER — PHENYLEPHRINE HCL 10 MG/ML IJ SOLN
10.0000 mg | INTRAVENOUS | Status: DC | PRN
  Administered 2014-06-20: 35 ug/min via INTRAVENOUS

## 2014-06-20 SURGICAL SUPPLY — 45 items
APPLIER CLIP 5 13 M/L LIGAMAX5 (MISCELLANEOUS) ×4
APR CLP MED LRG 5 ANG JAW (MISCELLANEOUS) ×2
CANISTER SUCTION 2500CC (MISCELLANEOUS) ×2 IMPLANT
CHLORAPREP W/TINT 26ML (MISCELLANEOUS) ×6 IMPLANT
CLIP APPLIE 5 13 M/L LIGAMAX5 (MISCELLANEOUS) IMPLANT
COVER SURGICAL LIGHT HANDLE (MISCELLANEOUS) ×4 IMPLANT
DRAPE LAPAROSCOPIC ABDOMINAL (DRAPES) ×4 IMPLANT
DRAPE UTILITY XL STRL (DRAPES) ×8 IMPLANT
DRAPE WARM FLUID 44X44 (DRAPE) ×4 IMPLANT
DRSG TEGADERM 2-3/8X2-3/4 SM (GAUZE/BANDAGES/DRESSINGS) ×4 IMPLANT
DRSG TEGADERM 4X4.75 (GAUZE/BANDAGES/DRESSINGS) ×6 IMPLANT
ELECT REM PT RETURN 9FT ADLT (ELECTROSURGICAL) ×4
ELECTRODE REM PT RTRN 9FT ADLT (ELECTROSURGICAL) ×2 IMPLANT
GAUZE SPONGE 2X2 8PLY STRL LF (GAUZE/BANDAGES/DRESSINGS) ×2 IMPLANT
GLOVE BIOGEL PI IND STRL 6.5 (GLOVE) IMPLANT
GLOVE BIOGEL PI IND STRL 8 (GLOVE) ×2 IMPLANT
GLOVE BIOGEL PI INDICATOR 6.5 (GLOVE) ×6
GLOVE BIOGEL PI INDICATOR 8 (GLOVE) ×2
GLOVE ECLIPSE 8.0 STRL XLNG CF (GLOVE) ×4 IMPLANT
GLOVE OPTIFIT SS 6.5 STRL BRWN (GLOVE) ×4 IMPLANT
GLOVE SKINSENSE STRL SZ6.0 (GLOVE) ×6 IMPLANT
GOWN STRL REUS W/ TWL LRG LVL3 (GOWN DISPOSABLE) ×4 IMPLANT
GOWN STRL REUS W/ TWL XL LVL3 (GOWN DISPOSABLE) ×2 IMPLANT
GOWN STRL REUS W/TWL LRG LVL3 (GOWN DISPOSABLE) ×8
GOWN STRL REUS W/TWL XL LVL3 (GOWN DISPOSABLE) ×4
KIT BASIN OR (CUSTOM PROCEDURE TRAY) ×4 IMPLANT
KIT ROOM TURNOVER OR (KITS) ×4 IMPLANT
MESH ULTRAPRO 6X6 15CM15CM (Mesh General) ×4 IMPLANT
NEEDLE 22X1 1/2 (OR ONLY) (NEEDLE) ×4 IMPLANT
NS IRRIG 1000ML POUR BTL (IV SOLUTION) ×4 IMPLANT
PAD ARMBOARD 7.5X6 YLW CONV (MISCELLANEOUS) ×10 IMPLANT
PAD ONESTEP ZOLL R SERIES ADT (MISCELLANEOUS) ×2 IMPLANT
SCISSORS LAP 5X35 DISP (ENDOMECHANICALS) ×2 IMPLANT
SET IRRIG TUBING LAPAROSCOPIC (IRRIGATION / IRRIGATOR) ×2 IMPLANT
SPONGE GAUZE 2X2 STER 10/PKG (GAUZE/BANDAGES/DRESSINGS) ×2
SUT MNCRL AB 4-0 PS2 18 (SUTURE) ×4 IMPLANT
SUT VIC AB 3-0 SH 27 (SUTURE)
SUT VIC AB 3-0 SH 27XBRD (SUTURE) IMPLANT
TOWEL OR 17X24 6PK STRL BLUE (TOWEL DISPOSABLE) ×4 IMPLANT
TOWEL OR 17X26 10 PK STRL BLUE (TOWEL DISPOSABLE) ×4 IMPLANT
TRAY FOLEY CATH 16FRSI W/METER (SET/KITS/TRAYS/PACK) IMPLANT
TRAY LAPAROSCOPIC (CUSTOM PROCEDURE TRAY) ×4 IMPLANT
TROCAR XCEL BLUNT TIP 100MML (ENDOMECHANICALS) ×4 IMPLANT
TROCAR XCEL NON-BLD 5MMX100MML (ENDOMECHANICALS) ×8 IMPLANT
TUBING INSUFFLATION (TUBING) ×4 IMPLANT

## 2014-06-20 NOTE — H&P (View-Only) (Signed)
[name] 06/07/2014 9:22 AM Location: [facility] Patient #: [id number] DOB: [birth year] Married / Language: [name] / Race: White Male History of Present Illness [name] Hector MD; 06/07/2014 10:25 AM) Patient words: RIH.  The patient is a 74 year old male who presents with an inguinal hernia. Patient sent to me by Dr. Tamala Ser for concern of symptomatic right inguinal hernia. Pleasant active male. History of recurrent ventricular tachycardia. Underwent catheter ablations and has defibrillator. Followed by Drs. Caryl Comes and [place] in town. Also associated with [facility]. Chronically anticoagulated. Ischemic cardiomyopathy EF 25-30%. He does have good exercise tolerance. Could walk 45 minutes a day 5 times a week. Problems twice a day. No major urinary symptoms. History of aortic aneurysm repaired endovascularly years ago. No worsening problems there. Patient noted right groin pain and swelling last month. It is become more painful. Getting the point where he can do his daily walks. Difficult to sit. He comes today with his wife. No abdominal surgery. No prior hernia surgeries. CT scan and physical exam concerning for small bowel within right inguinal hernia. Therefore sent for surgical evaluation. Other Problems [name], CMA; 06/07/2014 9:22 AM) Arthritis Atrial Fibrillation Congestive Heart Failure Diverticulosis High blood pressure Hypercholesterolemia Myocardial infarction Thyroid Disease  Past Surgical History [name], [name]; 06/07/2014 9:22 AM) Oral Surgery Tonsillectomy  Diagnostic Studies History [name], CMA; 06/07/2014 9:22 AM) Colonoscopy never  Allergies [name], CMA; 06/07/2014 9:23 AM) Iohexol *DIAGNOSTIC PRODUCTS* PrednisoLONE *CORTICOSTEROIDS*  Medication History [name], CMA; 06/07/2014 9:23 AM) Hydrocodone-Acetaminophen (5-325MG  Tablet, Oral) Active. ALPRAZolam  (0.25MG  Tablet, Oral) Active. TraMADol HCl (50MG  Tablet, Oral) Active. Azithromycin (500MG  Tablet, Oral) Active. Carvedilol (12.5MG  Tablet, Oral) Active. Carvedilol (6.25MG  Tablet, Oral) Active. Crestor (5MG  Tablet, Oral) Active. Furosemide (20MG  Tablet, Oral) Active. Levothyroxine Sodium (88MCG Tablet, Oral) Active. Losartan Potassium (50MG  Tablet, Oral) Active. Meclizine HCl (25MG  Tablet, Oral) Active. Mexiletine HCl (150MG  Capsule, Oral) Active. Sotalol HCl (80MG  Tablet, Oral) Active. Warfarin Sodium (2.5MG  Tablet, Oral) Active.  Social History [name], Fowler; 06/07/2014 9:22 AM) Alcohol use Recently quit alcohol use. No caffeine use No drug use Tobacco use Former smoker.  Family History [name], [name]; 06/07/2014 9:22 AM) Breast Cancer Sister. Heart Disease Father, Mother, Sister. Heart disease in male family member before age 35 Hypertension Father, Mother.     Review of Systems [name] CMA; 06/07/2014 9:22 AM) General Present- Fatigue. Not Present- Appetite Loss, Chills, Fever, Night Sweats, Weight Gain and Weight Loss. HEENT Not Present- Earache, Hearing Loss, Hoarseness, Nose Bleed, Oral Ulcers, Ringing in the Ears, Seasonal Allergies, Sinus Pain, Sore Throat, Visual Disturbances, Wears glasses/contact lenses and Yellow Eyes. Respiratory Not Present- Bloody sputum, Chronic Cough, Difficulty Breathing, Snoring and Wheezing. Cardiovascular Not Present- Chest Pain, Difficulty Breathing Lying Down, Leg Cramps, Palpitations, Rapid Heart Rate, Shortness of Breath and Swelling of Extremities. Male Genitourinary Present- Urgency. Not Present- Blood in Urine, Change in Urinary Stream, Frequency, Impotence, Nocturia, Painful Urination and Urine Leakage. Musculoskeletal Present- Muscle Weakness. Not Present- Back Pain, Joint Pain, Joint Stiffness, Muscle Pain and Swelling of Extremities. Neurological Present- Headaches. Not Present- Decreased  Memory, Fainting, Numbness, Seizures, Tingling, Tremor, Trouble walking and Weakness. Endocrine Present- Cold Intolerance. Not Present- Excessive Hunger, Hair Changes, Heat Intolerance, Hot flashes and New Diabetes. Hematology Not Present- Easy Bruising, Excessive bleeding, Gland problems, HIV and Persistent Infections.  Vitals [name] CMA; 06/07/2014 9:24 AM) 06/07/2014 9:23 AM Weight: 175.5 lb Height: 68in Body Surface Area: 1.95 m Body Mass Index: 26.68 kg/m  Temp.: 97.53F  Pulse: 70 (Regular)  BP: 118/60 (Sitting, Left Arm, Standard)     Physical Exam [name] Hector MD; 06/07/2014 10:05 AM)  General Mental Status-Alert. General Appearance-Not in acute distress, Not Sickly. Orientation-Oriented X3. Hydration-Well hydrated. Voice-Normal. Note: Moves slowly but steadily.   Integumentary Global Assessment Upon inspection and palpation of skin surfaces of the - Axillae: non-tender, no inflammation or ulceration, no drainage. and Distribution of scalp and body hair is normal. General Characteristics Temperature - normal warmth is noted.  Head and Neck Head-normocephalic, atraumatic with no lesions or palpable masses. Face Global Assessment - atraumatic, no absence of expression. Neck Global Assessment - no abnormal movements, no bruit auscultated on the right, no bruit auscultated on the left, no decreased range of motion, non-tender. Trachea-midline. Thyroid Gland Characteristics - non-tender.  Eye Eyeball - Left-Extraocular movements intact, No Nystagmus. Eyeball - Right-Extraocular movements intact, No Nystagmus. Cornea - Left-No Hazy. Cornea - Right-No Hazy. Sclera/Conjunctiva - Left-No scleral icterus, No Discharge. Sclera/Conjunctiva - Right-No scleral icterus, No Discharge. Pupil - Left-Direct reaction to light normal. Pupil - Right-Direct reaction to light normal.  ENMT Ears Pinna - Left - no drainage  observed, no generalized tenderness observed. Right - no drainage observed, no generalized tenderness observed. Nose and Sinuses External Inspection of the Nose - no destructive lesion observed. Inspection of the nares - Left - quiet respiration. Right - quiet respiration. Mouth and Throat Lips - Upper Lip - no fissures observed, no pallor noted. Lower Lip - no fissures observed, no pallor noted. Nasopharynx - no discharge present. Oral Cavity/Oropharynx - Tongue - no dryness observed. Oral Mucosa - no cyanosis observed. Hypopharynx - no evidence of airway distress observed.  Chest and Lung Exam Inspection Movements - Normal and Symmetrical. Accessory muscles - No use of accessory muscles in breathing. Palpation Palpation of the chest reveals - Non-tender. Auscultation Breath sounds - Normal and Clear.  Cardiovascular Auscultation Rhythm - Regular. Murmurs & Other Heart Sounds - Auscultation of the heart reveals - No Murmurs and No Systolic Clicks.  Abdomen Inspection Inspection of the abdomen reveals - No Visible peristalsis and No Abnormal pulsations. Umbilicus - No Bleeding, No Urine drainage. Palpation/Percussion Palpation and Percussion of the abdomen reveal - Soft, Non Tender, No Rebound tenderness, No Rigidity (guarding) and No Cutaneous hyperesthesia. Note: Soft and flat. No inguinal hernias. No incisions.   Male Genitourinary Sexual Maturity Tanner 5 - Adult hair pattern and Adult penile size and shape. Note: Uncircumcised male. Some testicular atrophy left greater than right. Obvious right inguinal hernia. Sensitive but reducible. Small impulse on the left side suspicious for left inguinal hernia.   Peripheral Vascular Upper Extremity Inspection - Left - No Cyanotic nailbeds, Not Ischemic. Right - No Cyanotic nailbeds, Not Ischemic.  Neurologic Neurologic evaluation reveals -normal attention span and ability to concentrate, able to name objects and repeat phrases.  Appropriate fund of knowledge , normal sensation and normal coordination. Mental Status Affect - not angry, not paranoid. Cranial Nerves-Normal Bilaterally. Gait-Normal.  Neuropsychiatric Mental status exam performed with findings of-able to articulate well with normal speech/language, rate, volume and coherence, thought content normal with ability to perform basic computations and apply abstract reasoning and no evidence of hallucinations, delusions, obsessions or homicidal/suicidal ideation.  Musculoskeletal Global Assessment Spine, Ribs and Pelvis - no instability, subluxation or laxity. Right Upper Extremity - no instability, subluxation or laxity.  Lymphatic Head & Neck  General Head & Neck Lymphatics: Bilateral - Description - No Localized lymphadenopathy. Axillary  General Axillary Region: Bilateral - Description - No Localized lymphadenopathy. Femoral & Inguinal  Generalized Femoral & Inguinal Lymphatics: Left - Description - No Localized lymphadenopathy. Right - Description - No Localized lymphadenopathy.    Assessment & Plan [name] Hector MD; 06/07/2014 10:26 AM)  BILATERAL INGUINAL HERNIA WITHOUT OBSTRUCTION OR GANGRENE, RECURRENCE NOT SPECIFIED (550.92  K40.20) Impression: Right greater than left inguinal hernias based on exam and CT scan. Right very sensitive with small bowel within [facility].  Think the patient would benefit from surgical repair. We'll plan laparoscopic approach given bilateral nature. He has pretty good quality of life and this is affecting it. Good exercise tolerance. Suspect he should tolerate this relatively well.  He is fully anticoagulated. Would like cardiac clearance to make sure it safe to transition off warfarin perioperatively try and minimize risk of stroke or attack and significant bleeding around the time of surgery. Also we plan to help manage his ICD defibrillator. Most likely can use magnet or [employer] can hold.  Current  Plans Schedule for Surgery Pt Education - [facility] Hernia Post-Op HCI (Emmarose Klinke): discussed with patient and provided information. Pt Education - [facility] Pain Control (Ellice Boulting[place]) Pt Education - [facility] Good Bowel Health (Nowell Sites) The anatomy & physiology of the abdominal wall and pelvic floor was discussed. The pathophysiology of hernias in the inguinal and pelvic region was discussed. Natural history risks such as progressive enlargement, pain, incarceration, and strangulation was discussed. Contributors to complications such as smoking, obesity, diabetes, prior surgery, etc were discussed.  I feel the risks of no intervention will lead to serious problems that outweigh the operative risks; therefore, I recommended surgery to reduce and repair the hernia. I explained laparoscopic techniques with possible need for an open approach. I noted usual use of mesh to patch and/or buttress hernia repair  Risks such as bleeding, infection, abscess, need for further treatment, heart attack, death, and other risks were discussed. I noted a good likelihood this will help address the problem. Goals of post-operative recovery were discussed as well. Possibility that this will not correct all symptoms was explained. I stressed the importance of low-impact activity, aggressive pain control, avoiding constipation, & not pushing through pain to minimize risk of post-operative chronic pain or injury. Possibility of reherniation was discussed. We will work to minimize complications.  An educational handout further explaining the pathology & treatment options was given as well. Questions were answered. The patient expresses understanding & wishes to proceed with surgery.

## 2014-06-20 NOTE — Discharge Instructions (Signed)

## 2014-06-20 NOTE — Progress Notes (Signed)
[employer] rep [place] called to say transmission of AICD went through, all is in order and pt can dc to home.

## 2014-06-20 NOTE — Progress Notes (Signed)
Have been attempting to interrogate/transmit AICD post op since pt adm in PACU & have been unsuccessful, [employer] contacted by phone and she is paging rep. Aaron Edelman to assist with this.

## 2014-06-20 NOTE — Progress Notes (Addendum)
Dr. Linna Caprice aware that pt has an ICD and orders were not received from Dr. Olin Pia office regarding device. Dr. Linna Caprice denies need for calling [employer] rep pre-op, will have ICD interrogated after surgery per him.  Emergency Procedures For Device Patients placed on chart.

## 2014-06-20 NOTE — Interval H&P Note (Signed)
History and Physical Interval Note:  06/20/2014 2:45 PM  [name]  has presented today for surgery, with the diagnosis of Bilateral Inguinal Hernia  The various methods of treatment have been discussed with the patient and family. After consideration of risks, benefits and other options for treatment, the patient has consented to  Procedure(s): [place] (Bilateral) INSERTION OF MESH (N/A) as a surgical intervention .  The patient's history has been reviewed, patient examined, no change in status, stable for surgery.  I have reviewed the patient's chart and labs.  Questions were answered to the patient's satisfaction.     [name] C.

## 2014-06-20 NOTE — Anesthesia Preprocedure Evaluation (Signed)
Anesthesia Evaluation  Patient identified by MRN, date of birth, ID band Patient awake    Reviewed: Allergy & Precautions, NPO status , Patient's Chart, lab work & pertinent test results  Airway Mallampati: II  TM Distance: >3 FB Neck ROM: Full    Dental  (+) Teeth Intact, Dental Advisory Given   Pulmonary former smoker,  breath sounds clear to auscultation        Cardiovascular hypertension, Rhythm:Regular Rate:Normal     Neuro/Psych    GI/Hepatic   Endo/Other    Renal/GU      Musculoskeletal   Abdominal   Peds  Hematology   Anesthesia Other Findings   Reproductive/Obstetrics                             Anesthesia Physical Anesthesia Plan  ASA: III  Anesthesia Plan: General   Post-op Pain Management:    Induction: Intravenous  Airway Management Planned: Oral ETT  Additional Equipment:   Intra-op Plan:   Post-operative Plan: Extubation in OR  Informed Consent: I have reviewed the patients History and Physical, chart, labs and discussed the procedure including the risks, benefits and alternatives for the proposed anesthesia with the patient or authorized representative who has indicated his/her understanding and acceptance.   Dental advisory given  Plan Discussed with: CRNA and Anesthesiologist  Anesthesia Plan Comments: (Bilateral inguinal hernias Ischemic cardiomyopathy H/O V.Tach with AICD in place EF 25% Atrial fibrillation on coumadin with lovenox bridge Renal insufficiency Cr 1.34  Plan GA with oral ETT  Roberts Gaudy)        Anesthesia Quick Evaluation

## 2014-06-20 NOTE — Transfer of Care (Signed)
Immediate Anesthesia Transfer of Care Note  Patient: [name]  Procedure(s) Performed: Procedure(s): LAPAROSCOPIC BILATERAL INGUINAL HERNIA REPAIR (Bilateral) INSERTION OF MESH (N/A)  Patient Location: PACU  Anesthesia Type:General  Level of Consciousness: awake, alert , oriented, patient cooperative and responds to stimulation  Airway & Oxygen Therapy: Patient Spontanous Breathing and Patient connected to nasal cannula oxygen  Post-op Assessment: Report given to PACU RN and Post -op Vital signs reviewed and stable  Post vital signs: Reviewed and stable  Complications: No apparent anesthesia complications

## 2014-06-20 NOTE — Anesthesia Procedure Notes (Signed)
Procedure Name: Intubation Date/Time: 06/20/2014 3:42 PM Performed by: Jenne Campus Pre-anesthesia Checklist: Patient identified, Emergency Drugs available, Suction available, Patient being monitored and Timeout performed Patient Re-evaluated:Patient Re-evaluated prior to inductionOxygen Delivery Method: Circle system utilized Preoxygenation: Pre-oxygenation with 100% oxygen Intubation Type: IV induction Ventilation: Mask ventilation without difficulty and Oral airway inserted - appropriate to patient size Laryngoscope Size: Miller and 2 Grade View: Grade I Tube size: 7.5 mm Number of attempts: 1 Airway Equipment and Method: Stylet Placement Confirmation: ETT inserted through vocal cords under direct vision,  positive ETCO2,  CO2 detector and breath sounds checked- equal and bilateral Secured at: 22 cm Tube secured with: Tape Dental Injury: Teeth and Oropharynx as per pre-operative assessment

## 2014-06-20 NOTE — Progress Notes (Signed)
Pt up in recliner X 1 hour, spouse here. Ambul x2 and tol well. BRP x2 w/asst-he is unable to void. Not uncomfortable or distended. Tol po fluids well. He wants to go home. Spouse says Dr Johney Maine told her this afternoon that pt DID NOT need to void before dc, that pt could go home and stand in warm shower or sit in the tub. Dr Grandville Silos (on call) and fully updated. Said OK to dc pt to home without in & out cath, foley, or having voided. Pt & spouse are comfortable with this.

## 2014-06-20 NOTE — Op Note (Signed)
06/20/2014  5:16 PM  PATIENT:  [name]  74 y.o. male  Patient Care Team: Sheela Stack, MD as PCP - General (Endocrinology) Darlin Coco, MD as Consulting Physician (Cardiology) Deboraha Sprang, MD as Consulting Physician (Cardiology) Serafina Mitchell, MD as Consulting Physician (Vascular Surgery) Michael Boston, MD as Consulting Physician (General Surgery)  PRE-OPERATIVE DIAGNOSIS:  Bilateral Inguinal Hernia  POST-OPERATIVE DIAGNOSIS:    Bilateral Inguinal Hernia Left femoral hernia  PROCEDURE:   LAPAROSCOPIC BILATERAL INGUINAL HERNIA REPAIR LAPAROSCOPIC LEFT FEMORAL HERNIA REPAIR INSERTION OF MESH  SURGEON:  Surgeon(s): Michael Boston, MD  ASSISTANT: RN   NESTHESIA:   local and general  EBL:  Total I/O In: 500 [I.V.:500] Out: 100 [Urine:100]  Delay start of Pharmacological VTE agent (>24hrs) due to surgical blood loss or risk of bleeding:  no  DRAINS: none   SPECIMEN:  No Specimen  DISPOSITION OF SPECIMEN:  N/A  COUNTS:  YES  PLAN OF CARE: Discharge to home after PACU  PATIENT DISPOSITION:  PACU - hemodynamically stable.  INDICATION:  Patient with BIH & stable cardiac disease.  I recommended repair.  Cleared by cardiology  The anatomy & physiology of the abdominal wall and pelvic floor was discussed.  The pathophysiology of hernias in the inguinal and pelvic region was discussed.  Natural history risks such as progressive enlargement, pain, incarceration & strangulation was discussed.   Contributors to complications such as smoking, obesity, diabetes, prior surgery, etc were discussed.    I feel the risks of no intervention will lead to serious problems that outweigh the operative risks; therefore, I recommended surgery to reduce and repair the hernia.  I explained laparoscopic techniques with possible need for an open approach.  I noted usual use of mesh to patch and/or buttress hernia repair  Risks such as bleeding, infection, abscess, need  for further treatment, heart attack, death, and other risks were discussed.  I noted a good likelihood this will help address the problem.   Goals of post-operative recovery were discussed as well.  Possibility that this will not correct all symptoms was explained.  I stressed the importance of low-impact activity, aggressive pain control, avoiding constipation, & not pushing through pain to minimize risk of post-operative chronic pain or injury. Possibility of reherniation was discussed.  We will work to minimize complications.     An educational handout further explaining the pathology & treatment options was given as well.  Questions were answered.  The patient expresses understanding & wishes to proceed with surgery.  OR FINDINGS: BIH - indirect R>L  Small left femoral hernia  DESCRIPTION:   The patient was identified & brought into the operating room. The patient was positioned supine with arms tucked. SCDs were active during the entire case. The patient underwent general anesthesia without any difficulty.  The abdomen was prepped and draped in a sterile fashion. The patient's bladder was emptied.  A Surgical Timeout confirmed our plan.  I made a transverse incision through the inferior umbilical fold.  I made a small transverse nick through the anterior rectus fascia contralateral to the inguinal hernia side and placed a 0-vicryl stitch through the fascia.  I placed a Hasson trocar into the preperitoneal plane.  Entry was clean.  We induced carbon dioxide insufflation. Camera inspection revealed no injury.  I used a 15mm angled scope to bluntly free the peritoneum off the infraumbilical anterior abdominal wall.  I created enough of a preperitoneal pocket to place 75mm ports into the right &  left mid-abdomen into this preperitoneal cavity.  I focused attention on the right side since that was the dominant hernia side.   I used blunt & focused sharp dissection to free the peritoneum off the flank  and down to the pubic rim.  I freed the anteriolateral bladder wall off the anteriolateral pelvic wall, sparing midline attachments.   I located a swath of peritoneum going into a hernia fascial defect at the internal ring consistent with an indirect hernia.  I gradually freed the peritoneal hernia sac off safely and reduced it into the preperitoneal space.  I freed the peritoneum off the spermatic vessels & vas deferens.  I freed peritoneum off the retroperitoneum along the psoas muscle.    I checked & assured hemostasis.    I turned attention on the opposite side.  I did dissection in a similar, mirror-image fashion. The patient had a similar but smaller left indirect hernia.  Small but definite femoral hernia as well.  Spermatic cord lipomas dissected off both sides & removed.  In freeing off the hernia sacs I did have a breech in the RLQ peritoneum.  I closed that using absorbable Vicryl stitch using laparoscopic intracorporeal suturing for a good high ligation as well.  There were no breaches in peritoneum that required closure on the left.  I chose 15x15 cm sheets of ultra-lightweight polypropylene mesh (Ultrapro), one for each side.  I cut a single sigmoid-shaped slit ~6cm from a corner of each mesh.  I placed the meshes into the preperitoneal space & laid them as overlapping diamonds such that at the inferior points, a 6x6 cm corner flap rested in the true anterolateral pelvis, covering the obturator & femoral foramina.   I allowed the bladder to fall back and help tuck the corners of the mesh in.  The medial corners overlapped each other across midline cephalad to the pubic rim.   This provided >2 inch coverage around the hernia.  Because the defects well covered and not particularly large, I did not need tacks to hold the mesh in place  I held the hernia sacs cephalad & evacuated carbon dioxide.  I closed the fascia  With absorbable suture.  I closed the skin using 4-0 monocryl stitch.  Sterile  dressings were applied. The patient was extubated & arrived in the PACU in stable condition..  I had discussed postoperative care with the patient in the holding area.   I did discuss operative findings and postoperative goals / instructions to family as well.  Instructions are written in the chart.  Patient really wants to go home tonight to avoid the snowstorm.  We will see if it is safe & he is recovering OK in the PACU.  Otherwise observation.  [name], M.D., F.A.C.S. Gastrointestinal and Minimally Invasive Surgery [facility] [address] [place], [place] [id number] [phone] Main / Paging

## 2014-06-21 ENCOUNTER — Encounter (HOSPITAL_COMMUNITY): Payer: Self-pay | Admitting: Surgery

## 2014-06-21 NOTE — Anesthesia Postprocedure Evaluation (Signed)
  Anesthesia Post-op Note  Patient: [name]  Procedure(s) Performed: Procedure(s): LAPAROSCOPIC BILATERAL INGUINAL HERNIA REPAIR (Bilateral) INSERTION OF MESH (N/A)  Patient Location: PACU  Anesthesia Type:General  Level of Consciousness: awake, oriented, sedated and patient cooperative  Airway and Oxygen Therapy: Patient Spontanous Breathing  Post-op Pain: mild  Post-op Assessment: Post-op Vital signs reviewed, Patient's Cardiovascular Status Stable, Respiratory Function Stable, Patent Airway, No signs of Nausea or vomiting and Pain level controlled  Post-op Vital Signs: stable  Last Vitals:  Filed Vitals:   06/20/14 1915  BP:   Pulse:   Temp: 36.7 C  Resp:     Complications: No apparent anesthesia complications

## 2014-06-25 ENCOUNTER — Ambulatory Visit (INDEPENDENT_AMBULATORY_CARE_PROVIDER_SITE_OTHER): Payer: Medicare Other | Admitting: *Deleted

## 2014-06-25 ENCOUNTER — Other Ambulatory Visit (INDEPENDENT_AMBULATORY_CARE_PROVIDER_SITE_OTHER): Payer: Medicare Other | Admitting: *Deleted

## 2014-06-25 DIAGNOSIS — Z79899 Other long term (current) drug therapy: Secondary | ICD-10-CM

## 2014-06-25 DIAGNOSIS — Z5181 Encounter for therapeutic drug level monitoring: Secondary | ICD-10-CM

## 2014-06-25 DIAGNOSIS — Z7901 Long term (current) use of anticoagulants: Secondary | ICD-10-CM

## 2014-06-25 DIAGNOSIS — I255 Ischemic cardiomyopathy: Secondary | ICD-10-CM

## 2014-06-25 LAB — BASIC METABOLIC PANEL
BUN: 31 mg/dL — ABNORMAL HIGH (ref 6–23)
CO2: 29 mEq/L (ref 19–32)
Calcium: 9.3 mg/dL (ref 8.4–10.5)
Chloride: 94 mEq/L — ABNORMAL LOW (ref 96–112)
Creatinine, Ser: 1.88 mg/dL — ABNORMAL HIGH (ref 0.40–1.50)
GFR: 37.6 mL/min — ABNORMAL LOW (ref >60.00)
Glucose, Bld: 145 mg/dL — ABNORMAL HIGH (ref 70–99)
Potassium: 4.7 mEq/L (ref 3.5–5.1)
Sodium: 129 mEq/L — ABNORMAL LOW (ref 135–145)

## 2014-06-25 LAB — POCT INR: INR: 1.9

## 2014-06-26 ENCOUNTER — Telehealth: Payer: Self-pay | Admitting: Cardiology

## 2014-06-26 ENCOUNTER — Other Ambulatory Visit: Payer: Self-pay | Admitting: Surgery

## 2014-06-26 NOTE — Telephone Encounter (Signed)
New Message          Pt's wife calling stating that pt has only had one bowel movement since his surgery on Thursday and pt's wife is really concerned, He has taken Mirilax everyday since Saturday and Hardin Negus Laxitive last night and has yet to have a bowel movement. Please call pt back and advise.

## 2014-06-26 NOTE — Telephone Encounter (Signed)
Spoke with wife regarding patient earlier today Patient continued to have a lot of swelling from recent hernia surgery, constipated, very weak, and still in a lot of pain Advised wife she needed to call surgeons office. She did and patient was seen today  Surgeon ordered labs and changed pain medication to Ultram, per wife She was wanting to give him Boost, discussed with  Dr. Mare Ferrari and ok to give 2 cans a day Wife to call back tomorrow with update

## 2014-06-27 ENCOUNTER — Encounter: Payer: Medicare Other | Admitting: *Deleted

## 2014-06-27 ENCOUNTER — Encounter (HOSPITAL_COMMUNITY): Payer: Self-pay | Admitting: Cardiology

## 2014-06-27 ENCOUNTER — Inpatient Hospital Stay (HOSPITAL_COMMUNITY)
Admission: AD | Admit: 2014-06-27 | Discharge: 2014-07-05 | DRG: 920 | Disposition: A | Discharge status: Home / Self Care | Payer: Medicare Other | Source: Ambulatory Visit | Attending: Surgery | Admitting: Surgery

## 2014-06-27 ENCOUNTER — Other Ambulatory Visit (INDEPENDENT_AMBULATORY_CARE_PROVIDER_SITE_OTHER): Payer: Self-pay | Admitting: Surgery

## 2014-06-27 DIAGNOSIS — E785 Hyperlipidemia, unspecified: Secondary | ICD-10-CM | POA: Diagnosis present

## 2014-06-27 DIAGNOSIS — I4892 Unspecified atrial flutter: Secondary | ICD-10-CM | POA: Diagnosis present

## 2014-06-27 DIAGNOSIS — K219 Gastro-esophageal reflux disease without esophagitis: Secondary | ICD-10-CM | POA: Diagnosis present

## 2014-06-27 DIAGNOSIS — I2510 Atherosclerotic heart disease of native coronary artery without angina pectoris: Secondary | ICD-10-CM | POA: Diagnosis present

## 2014-06-27 DIAGNOSIS — Z7901 Long term (current) use of anticoagulants: Secondary | ICD-10-CM

## 2014-06-27 DIAGNOSIS — F419 Anxiety disorder, unspecified: Secondary | ICD-10-CM | POA: Diagnosis present

## 2014-06-27 DIAGNOSIS — I10 Essential (primary) hypertension: Secondary | ICD-10-CM | POA: Diagnosis present

## 2014-06-27 DIAGNOSIS — I5022 Chronic systolic (congestive) heart failure: Secondary | ICD-10-CM | POA: Diagnosis present

## 2014-06-27 DIAGNOSIS — I4729 Other ventricular tachycardia: Secondary | ICD-10-CM

## 2014-06-27 DIAGNOSIS — I252 Old myocardial infarction: Secondary | ICD-10-CM

## 2014-06-27 DIAGNOSIS — D62 Acute posthemorrhagic anemia: Secondary | ICD-10-CM | POA: Diagnosis present

## 2014-06-27 DIAGNOSIS — I472 Ventricular tachycardia, unspecified: Secondary | ICD-10-CM

## 2014-06-27 DIAGNOSIS — L7622 Postprocedural hemorrhage and hematoma of skin and subcutaneous tissue following other procedure: Secondary | ICD-10-CM | POA: Diagnosis present

## 2014-06-27 DIAGNOSIS — Z95810 Presence of automatic (implantable) cardiac defibrillator: Secondary | ICD-10-CM | POA: Diagnosis not present

## 2014-06-27 DIAGNOSIS — E861 Hypovolemia: Secondary | ICD-10-CM | POA: Diagnosis present

## 2014-06-27 DIAGNOSIS — E875 Hyperkalemia: Secondary | ICD-10-CM | POA: Diagnosis present

## 2014-06-27 DIAGNOSIS — I4891 Unspecified atrial fibrillation: Secondary | ICD-10-CM

## 2014-06-27 DIAGNOSIS — D649 Anemia, unspecified: Secondary | ICD-10-CM

## 2014-06-27 DIAGNOSIS — R0602 Shortness of breath: Secondary | ICD-10-CM

## 2014-06-27 DIAGNOSIS — S301XXA Contusion of abdominal wall, initial encounter: Secondary | ICD-10-CM | POA: Diagnosis present

## 2014-06-27 DIAGNOSIS — A047 Enterocolitis due to Clostridium difficile: Secondary | ICD-10-CM | POA: Diagnosis not present

## 2014-06-27 DIAGNOSIS — R1110 Vomiting, unspecified: Secondary | ICD-10-CM

## 2014-06-27 DIAGNOSIS — K567 Ileus, unspecified: Secondary | ICD-10-CM

## 2014-06-27 DIAGNOSIS — N289 Disorder of kidney and ureter, unspecified: Secondary | ICD-10-CM

## 2014-06-27 DIAGNOSIS — N179 Acute kidney failure, unspecified: Secondary | ICD-10-CM | POA: Diagnosis present

## 2014-06-27 DIAGNOSIS — Z87891 Personal history of nicotine dependence: Secondary | ICD-10-CM | POA: Diagnosis not present

## 2014-06-27 DIAGNOSIS — I255 Ischemic cardiomyopathy: Secondary | ICD-10-CM | POA: Diagnosis present

## 2014-06-27 DIAGNOSIS — K4020 Bilateral inguinal hernia, without obstruction or gangrene, not specified as recurrent: Secondary | ICD-10-CM | POA: Diagnosis present

## 2014-06-27 DIAGNOSIS — A0472 Enterocolitis due to Clostridium difficile, not specified as recurrent: Secondary | ICD-10-CM | POA: Diagnosis not present

## 2014-06-27 HISTORY — DX: Supraventricular tachycardia: I47.1

## 2014-06-27 HISTORY — DX: Cardiac arrest, cause unspecified: I46.9

## 2014-06-27 HISTORY — DX: Anemia, unspecified: D64.9

## 2014-06-27 LAB — BASIC METABOLIC PANEL WITH GFR
BUN: 47 mg/dL — ABNORMAL HIGH (ref 6–23)
CALCIUM: 9.3 mg/dL (ref 8.4–10.5)
CO2: 25 meq/L (ref 19–32)
Chloride: 89 mEq/L — ABNORMAL LOW (ref 96–112)
Creat: 3.05 mg/dL — ABNORMAL HIGH (ref 0.50–1.35)
GFR, Est African American: 22 mL/min — ABNORMAL LOW
GFR, Est Non African American: 19 mL/min — ABNORMAL LOW
Glucose, Bld: 163 mg/dL — ABNORMAL HIGH (ref 70–99)
Potassium: 5.4 mEq/L — ABNORMAL HIGH (ref 3.5–5.3)
Sodium: 124 mEq/L — ABNORMAL LOW (ref 135–145)

## 2014-06-27 LAB — CBC WITH DIFFERENTIAL/PLATELET
BASOS ABS: 0 10*3/uL (ref 0.0–0.1)
Basophils Relative: 0 % (ref 0–1)
EOS ABS: 0 10*3/uL (ref 0.0–0.7)
EOS PCT: 0 % (ref 0–5)
HEMATOCRIT: 21.8 % — AB (ref 39.0–52.0)
Hemoglobin: 7.5 g/dL — ABNORMAL LOW (ref 13.0–17.0)
LYMPHS ABS: 1.4 10*3/uL (ref 0.7–4.0)
Lymphocytes Relative: 9 % — ABNORMAL LOW (ref 12–46)
MCH: 32.1 pg (ref 26.0–34.0)
MCHC: 34.4 g/dL (ref 30.0–36.0)
MCV: 93.2 fL (ref 78.0–100.0)
MONO ABS: 1.3 10*3/uL — AB (ref 0.1–1.0)
MONOS PCT: 8 % (ref 3–12)
MPV: 8.9 fL (ref 8.6–12.4)
Neutro Abs: 13.1 10*3/uL — ABNORMAL HIGH (ref 1.7–7.7)
Neutrophils Relative %: 83 % — ABNORMAL HIGH (ref 43–77)
Platelets: 332 10*3/uL (ref 150–400)
RBC: 2.34 MIL/uL — ABNORMAL LOW (ref 4.22–5.81)
RDW: 13.6 % (ref 11.5–15.5)
WBC: 15.8 10*3/uL — ABNORMAL HIGH (ref 4.0–10.5)

## 2014-06-27 LAB — PROTIME-INR
INR: 2.99 — AB (ref 0.00–1.49)
Prothrombin Time: 31.3 seconds — ABNORMAL HIGH (ref 11.6–15.2)

## 2014-06-27 LAB — PREPARE RBC (CROSSMATCH)

## 2014-06-27 LAB — MRSA PCR SCREENING: MRSA BY PCR: NEGATIVE

## 2014-06-27 MED ORDER — LACTATED RINGERS IV BOLUS (SEPSIS): 1000.0000 mL | Freq: Three times a day (TID) | INTRAVENOUS | Status: DC | PRN

## 2014-06-27 MED ORDER — VITAMIN C 500 MG PO TABS
500.0000 mg | ORAL_TABLET | Freq: Every day | ORAL | Status: DC
  Administered 2014-06-28: 500 mg via ORAL
  Filled 2014-06-27 (×2): qty 1

## 2014-06-27 MED ORDER — SOTALOL HCL 80 MG PO TABS
80.0000 mg | ORAL_TABLET | ORAL | Status: DC
  Filled 2014-06-27 (×2): qty 1

## 2014-06-27 MED ORDER — TRAMADOL HCL 50 MG PO TABS: 50.0000 mg | ORAL_TABLET | Freq: Two times a day (BID) | ORAL | Status: DC | PRN

## 2014-06-27 MED ORDER — FENTANYL CITRATE 0.05 MG/ML IJ SOLN: 25.0000 ug | INTRAMUSCULAR | Status: DC | PRN

## 2014-06-27 MED ORDER — BLISTEX MEDICATED EX OINT
TOPICAL_OINTMENT | Freq: Two times a day (BID) | CUTANEOUS | Status: DC
  Administered 2014-06-27: 1 via TOPICAL
  Administered 2014-06-27 – 2014-07-04 (×11): via TOPICAL
  Administered 2014-07-04: 1 via TOPICAL
  Administered 2014-07-05: 11:00:00 via TOPICAL
  Filled 2014-06-27: qty 10

## 2014-06-27 MED ORDER — PHENOL 1.4 % MT LIQD
2.0000 | OROMUCOSAL | Status: DC | PRN
  Filled 2014-06-27: qty 177

## 2014-06-27 MED ORDER — SODIUM CHLORIDE 0.9 % IV SOLN
Freq: Once | INTRAVENOUS | Status: AC
  Administered 2014-06-27: 11:00:00 via INTRAVENOUS

## 2014-06-27 MED ORDER — SOTALOL HCL 80 MG PO TABS
80.0000 mg | ORAL_TABLET | Freq: Two times a day (BID) | ORAL | Status: DC
  Filled 2014-06-27 (×2): qty 1

## 2014-06-27 MED ORDER — ONDANSETRON HCL 4 MG/2ML IJ SOLN
4.0000 mg | Freq: Four times a day (QID) | INTRAMUSCULAR | Status: DC | PRN
  Administered 2014-06-29 – 2014-07-03 (×4): 4 mg via INTRAVENOUS
  Filled 2014-06-27 (×4): qty 2

## 2014-06-27 MED ORDER — ACETAMINOPHEN 500 MG PO TABS
1000.0000 mg | ORAL_TABLET | Freq: Three times a day (TID) | ORAL | Status: DC
  Administered 2014-06-27 – 2014-07-01 (×7): 1000 mg via ORAL
  Filled 2014-06-27 (×20): qty 2

## 2014-06-27 MED ORDER — MEXILETINE HCL 150 MG PO CAPS
150.0000 mg | ORAL_CAPSULE | ORAL | Status: DC
  Administered 2014-06-27 – 2014-07-05 (×18): 150 mg via ORAL
  Filled 2014-06-27 (×29): qty 1

## 2014-06-27 MED ORDER — SODIUM CHLORIDE 0.9 % IV BOLUS (SEPSIS)
1000.0000 mL | Freq: Once | INTRAVENOUS | Status: AC
  Administered 2014-06-27: 1000 mL via INTRAVENOUS

## 2014-06-27 MED ORDER — ADULT MULTIVITAMIN W/MINERALS CH
1.0000 | ORAL_TABLET | Freq: Every day | ORAL | Status: DC
Start: 2014-06-27 — End: 2014-06-29
  Administered 2014-06-27 – 2014-06-28 (×2): 1 via ORAL
  Filled 2014-06-27 (×3): qty 1

## 2014-06-27 MED ORDER — DIPHENHYDRAMINE HCL 50 MG/ML IJ SOLN
12.5000 mg | Freq: Four times a day (QID) | INTRAMUSCULAR | Status: DC | PRN
  Administered 2014-06-29: 12.5 mg via INTRAVENOUS
  Filled 2014-06-27: qty 1

## 2014-06-27 MED ORDER — SACCHAROMYCES BOULARDII 250 MG PO CAPS
250.0000 mg | ORAL_CAPSULE | Freq: Two times a day (BID) | ORAL | Status: DC
  Administered 2014-06-28 (×2): 250 mg via ORAL
  Filled 2014-06-27 (×8): qty 1

## 2014-06-27 MED ORDER — SODIUM CHLORIDE 0.9 % IV SOLN
INTRAVENOUS | Status: DC
  Administered 2014-06-27 (×2): via INTRAVENOUS

## 2014-06-27 MED ORDER — POLYETHYLENE GLYCOL 3350 17 G PO PACK
17.0000 g | PACK | Freq: Two times a day (BID) | ORAL | Status: DC
  Administered 2014-06-27: 17 g via ORAL
  Filled 2014-06-27 (×4): qty 1

## 2014-06-27 MED ORDER — TRAMADOL HCL 50 MG PO TABS: 50.0000 mg | ORAL_TABLET | Freq: Four times a day (QID) | ORAL | Status: DC | PRN

## 2014-06-27 MED ORDER — DIPHENHYDRAMINE HCL 12.5 MG/5ML PO ELIX
12.5000 mg | ORAL_SOLUTION | Freq: Four times a day (QID) | ORAL | Status: DC | PRN
  Administered 2014-06-30: 12.5 mg via ORAL
  Filled 2014-06-27: qty 5
  Filled 2014-06-27: qty 10

## 2014-06-27 MED ORDER — MENTHOL 3 MG MT LOZG
1.0000 | LOZENGE | OROMUCOSAL | Status: DC | PRN
  Filled 2014-06-27: qty 9

## 2014-06-27 MED ORDER — FAMOTIDINE 20 MG PO TABS
20.0000 mg | ORAL_TABLET | Freq: Every day | ORAL | Status: DC
  Administered 2014-06-27 – 2014-06-28 (×2): 20 mg via ORAL
  Filled 2014-06-27 (×7): qty 1

## 2014-06-27 MED ORDER — LEVOTHYROXINE SODIUM 88 MCG PO TABS
132.0000 ug | ORAL_TABLET | ORAL | Status: DC
  Administered 2014-06-28 – 2014-07-05 (×2): 132 ug via ORAL
  Filled 2014-06-27 (×2): qty 1.5

## 2014-06-27 MED ORDER — BISACODYL 10 MG RE SUPP: 10.0000 mg | Freq: Two times a day (BID) | RECTAL | Status: DC | PRN

## 2014-06-27 MED ORDER — MEXILETINE HCL 150 MG PO CAPS
150.0000 mg | ORAL_CAPSULE | Freq: Three times a day (TID) | ORAL | Status: DC
  Filled 2014-06-27 (×3): qty 1

## 2014-06-27 MED ORDER — LORATADINE 10 MG PO TABS
10.0000 mg | ORAL_TABLET | Freq: Every day | ORAL | Status: DC
  Administered 2014-06-27 – 2014-07-05 (×5): 10 mg via ORAL
  Filled 2014-06-27 (×10): qty 1

## 2014-06-27 MED ORDER — ALUM & MAG HYDROXIDE-SIMETH 200-200-20 MG/5ML PO SUSP: 30.0000 mL | Freq: Four times a day (QID) | ORAL | Status: DC | PRN

## 2014-06-27 MED ORDER — LIP MEDEX EX OINT
1.0000 "application " | TOPICAL_OINTMENT | Freq: Two times a day (BID) | CUTANEOUS | Status: DC
  Filled 2014-06-27: qty 7

## 2014-06-27 MED ORDER — LEVOTHYROXINE SODIUM 88 MCG PO TABS
88.0000 ug | ORAL_TABLET | ORAL | Status: DC
  Administered 2014-07-02 – 2014-07-04 (×3): 88 ug via ORAL
  Filled 2014-06-27 (×8): qty 1

## 2014-06-27 MED ORDER — LEVOTHYROXINE SODIUM 88 MCG PO TABS: 88.0000 ug | ORAL_TABLET | Freq: Every day | ORAL | Status: DC

## 2014-06-27 MED ORDER — MAGIC MOUTHWASH
15.0000 mL | Freq: Four times a day (QID) | ORAL | Status: DC | PRN
  Administered 2014-07-02: 15 mL via ORAL
  Filled 2014-06-27 (×4): qty 15

## 2014-06-27 NOTE — H&P (Signed)
[facility]  [address] [place], [place] [address] Phone: [phone] FAX: [place]  Oct 12, 1940 [id number]  CARE TEAM:  PCP: Sheela Stack, MD  Outpatient Care Team: Patient Care Team: Sheela Stack, MD as PCP - General (Endocrinology) Darlin Coco, MD as Consulting Physician (Cardiology) Deboraha Sprang, MD as Consulting Physician (Cardiology) Serafina Mitchell, MD as Consulting Physician (Vascular Surgery) Michael Boston, MD as Consulting Physician (General Surgery)  Inpatient Treatment Team: _0 @  This patient is a 74 y.o.male who presents today for surgical evaluation.   Reason for evaluation: Fatigue  The patient is a 74 year old male presenting for a post-operative visit. Patient returns status post laparoscopic bilateral inguinal hernia repairs. Has been on a Lovenox bridge per cardiology's insistence. Had been gradually doing okay until 2 days ago. Struggled with soreness. We recommended increased taking his narcotic. Wife noted he got more confused. He feels rundown. He had a bowel movement 2 days after surgery. None for the last 3 days. Had been eating okay. Stopped eating much yesterday. Concerning. Called last night. They called on call surgeon that recommended holding narcotics and come in sooner. Brought in 1/27. In wheelchair but can stand up to exam table. Patient ate breakfast. He says he feels better today after holding the oxycodone. Still confused. Having some flatus. Urinating. Had an INR check yesterday. Therapeudic. Hgb not checked.  Lovenox stopped. Lasix increased.  No fall or trauma. No nausea or vomiting. Feeling sore. Had not taken anything for pain for 24 hours.  I recommended labs & close followup with admission if worsening or no improvement.  Patient had ## BMs with increased Miralax dose but still weak and wanting walker for  help.  Labs worse - I recommended admission.  Wife agrees.  Insists on [facility] hospital.      06/20/2014 5:16 PM PATIENT: [name] 74 y.o. male Patient Care Team: Sheela Stack, MD as PCP - General (Endocrinology) Darlin Coco, MD as Consulting Physician (Cardiology) Deboraha Sprang, MD as Consulting Physician (Cardiology) Serafina Mitchell, MD as Consulting Physician (Vascular Surgery) Michael Boston, MD as Consulting Physician (General Surgery) PRE-OPERATIVE DIAGNOSIS: Bilateral Inguinal Hernia POST-OPERATIVE DIAGNOSIS: Bilateral Inguinal Hernia Left femoral hernia PROCEDURE: LAPAROSCOPIC BILATERAL INGUINAL HERNIA REPAIR LAPAROSCOPIC LEFT FEMORAL HERNIA REPAIR INSERTION OF MESH SURGEON: Surgeon(s): Michael Boston, MD ASSISTANT: RN   Past Medical History  Diagnosis Date  . CAD (coronary artery disease) 1999    Remote anterolater and apical MI, cath 2011 patent stents RCA/LCx  LAD w/o obstruction  . Cardiomyopathy, ischemic     EF 20-25%  echo 2012  . Chronic systolic congestive heart failure   . ICD (implantable cardiac defibrillator) in place     Walker Jude  . 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 Abdominal aortic aneurysm 2009    Repaired with endovascular stent  . PVD (peripheral vascular disease)     Has occluded innominate vein  . Myocardial infarction   . Hypertension   . Ventricular tachycardia     recurrent slow VT at 100-110 bpm,  Rx mexilitene/sotalol  . Left bundle branch block   . Systolic CHF, chronic   . Arthritis   . Gynecomastia     2/2 spironolactone  . Atrial fibrillation   . Pericarditis     due to perforation  . Shortness of breath dyspnea     " if  walking up hills"  . Inguinal hernia recurrent bilateral   . Headache   . GERD (gastroesophageal reflux disease)   . Bruises easily   . AICD (automatic cardioverter/defibrillator) present     Past Surgical History  Procedure Laterality Date  . Icd  Feb 2012    Change out with LV  lead placed with tunneling from the right  to the left side  . Endovascular stent insertion      for AAA  . Cardiac catheterization      x2  . Pacemaker insertion    . Ventricular ablation surgery      s/p prior EPS and ablation at [facility] 9/12, [facility] 11/12, and most recent [facility] 08/23/11  . Tonsillectomy    . V-tach ablation N/A 08/24/2011    Procedure: V-TACH ABLATION;  Surgeon: Thompson Grayer, MD;  Location: [facility] CATH LAB;  Service: Cardiovascular;  Laterality: N/A;  . Inguinal hernia repair Bilateral 06/20/2014    Procedure: LAPAROSCOPIC BILATERAL INGUINAL HERNIA REPAIR;  Surgeon: Michael Boston, MD;  Location: [facility];  Service: General;  Laterality: Bilateral;  . Insertion of mesh N/A 06/20/2014    Procedure: INSERTION OF MESH;  Surgeon: Michael Boston, MD;  Location: [facility];  Service: General;  Laterality: N/A;    History   Social History  . Marital Status: Married    Spouse Name: N/A    Number of Children: N/A  . Years of Education: N/A   Occupational History  . retired      used to Armed forces operational officer   Social History Main Topics  . Smoking status: Former Smoker    Quit date: 01/07/1994  . Smokeless tobacco: Never Used  . Alcohol Use: Yes     Comment: very rare  . Drug Use: No  . Sexual Activity: Not Currently   Other Topics Concern  . Not on file   Social History Narrative    Family History  Problem Relation Age of Onset  . Stroke Mother   . Heart attack Mother   . Heart disease Mother   . Hyperlipidemia Mother   . Hypertension Mother   . Heart attack Father   . Heart disease Father   . Hyperlipidemia Father   . Hypertension Father   . Heart disease Sister   . Hyperlipidemia Sister   . Hypertension Brother     Current Outpatient Prescriptions  Medication Sig Dispense Refill  . ALPRAZolam (XANAX) 0.25 MG tablet Take 0.5-1 tablets (0.125-0.25 mg total) by mouth 4 (four) times daily. (Patient taking differently: Take 0.125-0.25 mg by mouth as needed for  anxiety or sleep. ) 360 tablet 1  . Ascorbic Acid (VITAMIN C) 500 MG tablet Take 500 mg by mouth daily.      . carvedilol (COREG) 12.5 MG tablet Take 1 tablet (12.5 mg total) by mouth 2 (two) times daily with a meal. Take one tablet by mouth twice daily 60 tablet 11  . Docusate Calcium (STOOL SOFTENER PO) Take 1 capsule by mouth daily.    . furosemide (LASIX) 20 MG tablet Take 20 mg by mouth 2 (two) times daily.    [name] levothyroxine (SYNTHROID, LEVOTHROID) 88 MCG tablet Take 88-132 mcg by mouth daily. Take 1.5 tablets on Friday and 1 tablet the rest of the week.    . loratadine (CLARITIN) 10 MG tablet Take 10 mg by mouth daily.    [name] losartan (COZAAR) 50 MG tablet Take 0.5 tablets (25 mg total) by mouth daily. 90 tablet 3  .  meclizine (ANTIVERT) 25 MG tablet TAKE 1 TABLET (25 MG TOTAL) BY MOUTH 3 (THREE) TIMES DAILY AS NEEDED. 270 tablet 2  . mexiletine (MEXITIL) 150 MG capsule TAKE 1 CAPSULE THREE TIMES A DAY 270 capsule 1  . Multiple Vitamin (MULTIVITAMIN WITH MINERALS) TABS tablet Take 1 tablet by mouth daily.    . Omega-3 Fatty Acids (FISH OIL) 1000 MG CAPS Take 1,000 mg by mouth 2 (two) times daily.     . ranitidine (ZANTAC) 150 MG tablet Take 150 mg by mouth every evening.    . rosuvastatin (CRESTOR) 5 MG tablet Take 5 mg by mouth 2 (two) times a week. Every Tuesday and Saturday    . sotalol (BETAPACE) 80 MG tablet Take 80 mg by mouth 2 (two) times daily.    [name] warfarin (COUMADIN) 2.5 MG tablet Take 2.5-3.75 mg by mouth daily. Take 3.75m ONLY on Friday Take 2.528mevery other day including Sunday.     No current facility-administered medications for this visit.     Allergies  Allergen Reactions  . Iohexol      Code: HIVES, Desc: hives w/ itching during cardiac cath '99, mult caths since w/ ? premeds, DR.G.HAYES REQUESTS 13 HR PRE MED//A.C.pt okay w/13 hr prep/mms, Onset Date: [id number] . Prednisone Other (See Comments)    Hives    ROS: Constitutional:  No fevers, chills, sweats.   FATIGUE.  Weight stable Eyes:  No vision changes, No discharge HENT:  No sore throats, nasal drainage Lymph: No neck swelling, No bruising easily Pulmonary:  No cough, productive sputum CV: No orthopnea, PND.    No exertional chest/neck/shoulder/arm pain. GI: No personal nor family history of GI/colon cancer, inflammatory bowel disease, irritable bowel syndrome, allergy such as Celiac Sprue, dietary/dairy problems, colitis, ulcers nor gastritis.  No recent sick contacts/gastroenteritis.  No travel outside the country.  No changes in diet. Renal: No UTIs, No hematuria Genital:  No drainage, bleeding, masses Musculoskeletal: No severe joint pain.  Good ROM major joints Skin:  No sores or lesions.  No rashes.  SIGNIFICANT BRUISING/SWELLING Heme/Lymph:  No easy bleeding.  No swollen lymph nodes Neuro: No focal weakness/numbness.  No seizures Psych: No suicidal ideation.  No hallucinations  There were no vitals taken for this visit.  Physical Exam: General: Pt awake/alert/oriented x4 in mild acute distress.  PALE Eyes: PERRL, normal EOM. Sclera nonicteric Neuro: CN II-XII intact w/o focal sensory/motor deficits. Lymph: No head/neck/groin lymphadenopathy Psych:  No delerium/psychosis/paranoia.  Mildly slow/confused HENT: Normocephalic, Mucus membranes moist.  No thrush Neck: Supple, No tracheal deviation Chest: No pain.  Good respiratory excursion. CV:  Pulses intact.  Regular rhythm Abdomen: Soft, Nondistended.  Nontender.  No incarcerated hernias.  Large ecchymosis BLQ down to scrotum Ext:  SCDs BLE.  1+ edema.  No cyanosis Skin: No petechiae / purpurea.  No major sores Musculoskeletal: No severe joint pain.  Good ROM major joints   Results:   Labs: Results for orders placed or performed in visit on 06/26/14 (from the past 48 hour(s))  CBC with Differential/Platelet     Status: Abnormal   Collection Time: 06/26/14  2:10 PM  Result Value Ref Range   WBC 15.8 (H) 4.0 - 10.5 K/uL    RBC 2.34 (L) 4.22 - 5.81 MIL/uL   Hemoglobin 7.5 (L) 13.0 - 17.0 g/dL   HCT 21.8 (L) 39.0 - 52.0 %   MCV 93.2 78.0 - 100.0 fL   MCH 32.1 26.0 - 34.0 pg   MCHC 34.4  30.0 - 36.0 g/dL   RDW 13.6 11.5 - 15.5 %   Platelets 332 150 - 400 K/uL   MPV 8.9 8.6 - 12.4 fL    Comment: ** Please note change in reference range(s). **   Neutrophils Relative % 83 (H) 43 - 77 %   Neutro Abs 13.1 (H) 1.7 - 7.7 K/uL   Lymphocytes Relative 9 (L) 12 - 46 %   Lymphs Abs 1.4 0.7 - 4.0 K/uL   Monocytes Relative 8 3 - 12 %   Monocytes Absolute 1.3 (H) 0.1 - 1.0 K/uL   Eosinophils Relative 0 0 - 5 %   Eosinophils Absolute 0.0 0.0 - 0.7 K/uL   Basophils Relative 0 0 - 1 %   Basophils Absolute 0.0 0.0 - 0.1 K/uL   Smear Review Criteria for review not met     Imaging / Studies: Dg Chest 2 View  06/15/2014   CLINICAL DATA:  Initial evaluation for shortness of breath.  Acute.  EXAM: CHEST  2 VIEW  COMPARISON:  Prior study from 01/20/2014  FINDINGS: Left-sided transvenous pacemaker/AICD is stable. Cardiomegaly unchanged. Mediastinal silhouette within normal limits. Tortuosity of the intrathoracic aorta noted.  Lungs are normally inflated. There is increased pulmonary vascular congestion with indistinctness of the interstitial markings, compatible with diffuse interstitial edema. No definite focal infiltrates. No pleural effusion. No pneumothorax.  No acute osseus abnormality.  IMPRESSION: Stable cardiomegaly with findings most consistent with mild to moderate diffuse pulmonary interstitial edema.   Electronically Signed   By: Jeannine Boga M.D.   On: 06/15/2014 02:21    Medications / Allergies: per chart  Antibiotics: Anti-infectives    None      Assessment  [name]  74 y.o. male  _0 @    Problem List:  Active Problems:   * No active hospital problems. *   Symptomatic anemia  ARF  Plan:  -admit to telemetry -transfuse 2U stat -follow Hgb - if continues to drop,  may need to be reversed.  Doubt it will be necessary but we will see -watch closely for overload - may need gentle diuresis but hold off until Cr better -cardiology consult.  D/w Dr Stanford Breed.  Their group will see the patient -VTE prophylaxis- SCDs, etc -mobilize as tolerated to help recovery  I called & updated the status of the patient to the patient's wife.  I made recommendations.  I answered questions.  Understanding & appreciation was expressed.     [name], M.D., F.A.C.S. Gastrointestinal and Minimally Invasive Surgery [facility] [address] [place], [place] [address] [phone] Main / Paging   06/27/2014  Note: Portions of this report may have been transcribed using voice recognition software. Every effort was made to ensure accuracy; however, inadvertent computerized transcription errors may be present.   Any transcriptional errors that result from this process are unintentional.

## 2014-06-27 NOTE — Consult Note (Addendum)
Primary cardiologist: TB  HPI: 75 yo male with PMH CAD, ICM, chronic systolic congestive heart failure, VT (s/p multiple ablations), and now with abdominal bleeding following laparoscopic hernia repair for evaluation of congestive heart failure. Patient recently seen in the emergency room with an episode of congestive heart failure and his Lasix was increased. Patient underwent laparoscopic hernia repair on January 21. He was on a Lovenox bridge for Coumadin. He did well for the first 2 days but then subtotally developed increased fatigue, abdominal girth/bruising and mild shortness of breath over the last 1-2 days. He has not had chest pain, palpitations or syncope. He was seen by Dr. Johney Maine and his hemoglobin was checked. It had fallen to 7.5. He has been admitted and cardiology has been asked to evaluate for congestive heart failure.  Medications Prior to Admission  Medication Sig Dispense Refill  . ALPRAZolam (XANAX) 0.25 MG tablet Take 0.5-1 tablets (0.125-0.25 mg total) by mouth 4 (four) times daily. (Patient taking differently: Take 0.125-0.25 mg by mouth as needed for anxiety or sleep. ) 360 tablet 1  . Ascorbic Acid (VITAMIN C) 500 MG tablet Take 500 mg by mouth daily.      . carvedilol (COREG) 12.5 MG tablet Take 1 tablet (12.5 mg total) by mouth 2 (two) times daily with a meal. Take one tablet by mouth twice daily 60 tablet 11  . Docusate Calcium (STOOL SOFTENER PO) Take 1 capsule by mouth daily.    . furosemide (LASIX) 20 MG tablet Take 20 mg by mouth 2 (two) times daily.    [name] levothyroxine (SYNTHROID, LEVOTHROID) 88 MCG tablet Take 88-132 mcg by mouth daily. Take 1.5 tablets on Friday and 1 tablet the rest of the week.    . loratadine (CLARITIN) 10 MG tablet Take 10 mg by mouth daily.    [name] losartan (COZAAR) 50 MG tablet Take 0.5 tablets (25 mg total) by mouth daily. 90 tablet 3  . meclizine (ANTIVERT) 25 MG tablet TAKE 1 TABLET (25 MG TOTAL) BY MOUTH 3 (THREE) TIMES DAILY AS NEEDED.  270 tablet 2  . mexiletine (MEXITIL) 150 MG capsule TAKE 1 CAPSULE THREE TIMES A DAY 270 capsule 1  . Multiple Vitamin (MULTIVITAMIN WITH MINERALS) TABS tablet Take 1 tablet by mouth daily.    . Omega-3 Fatty Acids (FISH OIL) 1000 MG CAPS Take 1,000 mg by mouth 2 (two) times daily.     . ranitidine (ZANTAC) 150 MG tablet Take 150 mg by mouth every evening.    . rosuvastatin (CRESTOR) 5 MG tablet Take 5 mg by mouth 2 (two) times a week. Every Tuesday and Saturday    . sotalol (BETAPACE) 80 MG tablet Take 80 mg by mouth 2 (two) times daily.    [name] warfarin (COUMADIN) 2.5 MG tablet Take 2.5-3.75 mg by mouth daily. Take 3.28m ONLY on Friday Take 2.557mevery other day including Sunday.      Allergies  Allergen Reactions  . Iohexol      Code: HIVES, Desc: hives w/ itching during cardiac cath '99, mult caths since w/ ? premeds, DR.G.HAYES REQUESTS 13 HR PRE MED//A.C.pt okay w/13 hr prep/mms, Onset Date: [id number] . Prednisone Other (See Comments)    Hives     Past Medical History  Diagnosis Date  . CAD (coronary artery disease) 1999    Remote anterolater and apical MI, cath 2011 patent stents RCA/LCx  LAD w/o obstruction  . Cardiomyopathy, ischemic     EF 20-25%  echo 2012  .  Chronic systolic congestive heart failure   . ICD (implantable cardiac defibrillator) in place     [place] Jude  . 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 Abdominal aortic aneurysm 2009    Repaired with endovascular stent  . PVD (peripheral vascular disease)     Has occluded innominate vein  . Myocardial infarction   . Hypertension   . Ventricular tachycardia     recurrent slow VT at 100-110 bpm,  Rx mexilitene/sotalol  . Left bundle branch block   . Systolic CHF, chronic   . Arthritis   . Gynecomastia     2/2 spironolactone  . Atrial fibrillation   . Pericarditis     due to perforation  . Inguinal hernia recurrent bilateral   . GERD (gastroesophageal reflux disease)     Past Surgical History  Procedure  Laterality Date  . Icd  Feb 2012    Change out with LV lead placed with tunneling from the right  to the left side  . Endovascular stent insertion      for AAA  . Cardiac catheterization      x2  . Pacemaker insertion    . Ventricular ablation surgery      s/p prior EPS and ablation at [facility] 9/12, [facility] 11/12, and most recent [facility] 08/23/11  . Tonsillectomy    . V-tach ablation N/A 08/24/2011    Procedure: V-TACH ABLATION;  Surgeon: Thompson Grayer, MD;  Location: [facility] CATH LAB;  Service: Cardiovascular;  Laterality: N/A;  . Inguinal hernia repair Bilateral 06/20/2014    Procedure: LAPAROSCOPIC BILATERAL INGUINAL HERNIA REPAIR;  Surgeon: Michael Boston, MD;  Location: [facility];  Service: General;  Laterality: Bilateral;  . Insertion of mesh N/A 06/20/2014    Procedure: INSERTION OF MESH;  Surgeon: Michael Boston, MD;  Location: [facility];  Service: General;  Laterality: N/A;    History   Social History  . Marital Status: Married    Spouse Name: N/A    Number of Children: N/A  . Years of Education: N/A   Occupational History  . retired      used to Armed forces operational officer   Social History Main Topics  . Smoking status: Former Smoker    Quit date: 01/07/1994  . Smokeless tobacco: Never Used  . Alcohol Use: Yes     Comment: very rare  . Drug Use: No  . Sexual Activity: Not Currently   Other Topics Concern  . Not on file   Social History Narrative    Family History  Problem Relation Age of Onset  . Stroke Mother   . Heart attack Mother   . Heart disease Mother   . Hyperlipidemia Mother   . Hypertension Mother   . Heart attack Father   . Heart disease Father   . Hyperlipidemia Father   . Hypertension Father   . Heart disease Sister   . Hyperlipidemia Sister   . Hypertension Brother     ROS:  Patient has some abdominal discomfort and fatigue but no fevers or chills, productive cough, hemoptysis, dysphasia, odynophagia, melena, hematochezia, dysuria, hematuria, rash, seizure  activity, orthopnea, PND, pedal edema, claudication. Remaining systems are negative.  Physical Exam:   Blood pressure 97/48, pulse 82, temperature 98 F (36.7 C), temperature source Oral, resp. rate 14, height 5' 8" (1.727 m), weight 177 lb 12.8 oz (80.65 kg), SpO2 95 %.  General:  Well developed/well nourished in NAD Skin warm/dry Patient not depressed No peripheral clubbing  Back-normal HEENT-normal/normal eyelids Neck supple/normal carotid upstroke bilaterally; no bruits; no JVD; no thyromegaly chest - CTA/ normal expansion CV - RRR/normal S1 and S2; no murmurs, rubs or gallops;  PMI nondisplaced Abdomen -large ecchymotic area over lower abdominal area with incisions from recent surgery noted. Large palpable hematoma. There is involvement of his penis and scrotal area. There is tenderness to palpation. No rebound. 2+ femoral pulses. No bruits. Ext-no edema, chords, 2+ DP Neuro-grossly nonfocal  ECG 06/15/2013-probable sinus with ventricular pacing.  Results for orders placed or performed in visit on 06/26/14 (from the past 48 hour(s))  CBC with Differential/Platelet     Status: Abnormal   Collection Time: 06/26/14  2:10 PM  Result Value Ref Range   WBC 15.8 (H) 4.0 - 10.5 K/uL   RBC 2.34 (L) 4.22 - 5.81 MIL/uL   Hemoglobin 7.5 (L) 13.0 - 17.0 g/dL   HCT 21.8 (L) 39.0 - 52.0 %   MCV 93.2 78.0 - 100.0 fL   MCH 32.1 26.0 - 34.0 pg   MCHC 34.4 30.0 - 36.0 g/dL   RDW 13.6 11.5 - 15.5 %   Platelets 332 150 - 400 K/uL   MPV 8.9 8.6 - 12.4 fL    Comment: ** Please note change in reference range(s). **   Neutrophils Relative % 83 (H) 43 - 77 %   Neutro Abs 13.1 (H) 1.7 - 7.7 K/uL   Lymphocytes Relative 9 (L) 12 - 46 %   Lymphs Abs 1.4 0.7 - 4.0 K/uL   Monocytes Relative 8 3 - 12 %   Monocytes Absolute 1.3 (H) 0.1 - 1.0 K/uL   Eosinophils Relative 0 0 - 5 %   Eosinophils Absolute 0.0 0.0 - 0.7 K/uL   Basophils Relative 0 0 - 1 %   Basophils Absolute 0.0 0.0 - 0.1 K/uL   Smear  Review Criteria for review not met      Assessment/Plan 1 acute blood loss anemia-the patient has clearly had bleeding from his recent surgical site related to Lovenox and Coumadin. Anticoagulation will be held. His hemoglobin has decreased from 14.5-7.5. Dr. Johney Maine is planning to transfuse 2 units of packed red blood cells and we will follow his hemoglobin closely. We can resume anticoagulation later once it is clear his bleeding has resolved. 2 acute renal failure-his creatinine has increased from 1.3-3.05 and his BUN from 15-47. This is most likely related to recent increase in diuretics followed by acute blood loss anemia with decreased perfusion of his kidneys. I agree with gentle hydration and transfusion. Follow renal function closely. Given size of hematoma it may also be worthwhile pursuing renal ultrasound if his renal function does not improve with the above measures. The patient does state he has been making urine. We will hold his ARB and diuretic for now. 3 chronic systolic congestive heart failure-patient does not appear to be volume overloaded at present. However his volume status has been very tenuous in the past. Follow clinical status very closely with hydration and transfusion. I will discontinue IV fluids after 1 liter. Reinitiate diuretics as renal function improves. 4 history of ventricular tachycardia-continue sotalol and mexiletine. 5 status post previous ICD 6 ischemic cardiomyopathy-given active bleeding and borderline blood pressure I will hold his ARB, Lasix and beta blocker for now. We will reinstitute later as his pressure improves. Given tenuous clinical status and candidacy for heart failure I would favor transfer to stepdown for 24-48 hours until it is clear he is stable. Kirk Ruths MD  06/27/2014, 11:33 AM  Given worsening renal function, will hold sotalol until Cr begins to improve and then reinitiate. Kirk Ruths

## 2014-06-27 NOTE — Telephone Encounter (Signed)
Spoke with wife this am and patient being admitted this am

## 2014-06-27 NOTE — Progress Notes (Signed)
Patient transferred to 2 H 27, report was given to nurse Josh.

## 2014-06-27 NOTE — Care Management Note (Signed)
    Page 1 of 1   06/27/2014     1:46:55 PM CARE MANAGEMENT NOTE 06/27/2014  Patient:  RHEA, THRUN   Account Number:  [id number]  Date Initiated:  06/27/2014  Documentation initiated by:  Elissa Hefty  Subjective/Objective Assessment:   adm w anemia,heart failure     Action/Plan:   lives w wife, pcp dr Reynold Bowen   Anticipated DC Date:     Anticipated DC Plan:           Choice offered to / List presented to:             Status of service:   Medicare Important Message given?   (If response is "NO", the following Medicare IM given date fields will be blank) Date Medicare IM given:   Medicare IM given by:   Date Additional Medicare IM given:   Additional Medicare IM given by:    Discharge Disposition:    Per UR Regulation:  Reviewed for med. necessity/level of care/duration of stay  If discussed at [place] of Stay Meetings, dates discussed:    Comments:

## 2014-06-27 NOTE — Progress Notes (Signed)
[name]  01/13/1941 [id number]  Patient Care Team: Sheela Stack, MD as PCP - General (Endocrinology) Darlin Coco, MD as Consulting Physician (Cardiology) Deboraha Sprang, MD as Consulting Physician (Cardiology) Serafina Mitchell, MD as Consulting Physician (Vascular Surgery) Michael Boston, MD as Consulting Physician (General Surgery)   Patient feeling better after blood Using bedside commode Tolerating clears - no nausea/vomiting UOP OK Not dizzy/lightheaded Abdomen unchanged Will follow closely  Patient Active Problem List   Diagnosis Date Noted  . Symptomatic anemia 06/27/2014  . Shortness of breath   . Pneumonia 01/18/2014  . Varicose veins of lower extremities with other complications 40/98/1191  . Cardiac arrest 12/05/2013  . SVT (supraventricular tachycardia) 12/05/2013  . Acute respiratory failure 12/05/2013  . Hyperkalemia 12/05/2013  . Acute renal insufficiency 12/05/2013  . Acute on chronic systolic heart failure 47/82/9562  . Encounter for therapeutic drug monitoring 10/10/2013  . Dyslipidemia 02/15/2013  . Coronary artery disease 04/08/2011  . Anxiety 04/08/2011  . HTN (hypertension) 02/02/2011  . Biventricular implantable cardioverter-defibrillator in situ 11/03/2010  . Sinus bradycardia 11/03/2010  . Chronic anticoagulation 10/05/2010  . Cardiomyopathy, ischemic   . Chronic systolic heart failure   . Ventricular tachycardia   . LBBB 05/21/2010  . 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 ABDOMINAL AORTIC ANEURYSM 02/07/2009    Past Medical History  Diagnosis Date  . CAD (coronary artery disease) 1999    Remote anterolater and apical MI, cath 2011 patent stents RCA/LCx  LAD w/o obstruction  . Cardiomyopathy, ischemic     EF 20-25%  echo 2012  . Chronic systolic congestive heart failure   . ICD (implantable cardiac defibrillator) in place     [place] [facility]  . 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 Abdominal aortic aneurysm 2009    Repaired with  endovascular stent  . PVD (peripheral vascular disease)     Has occluded innominate vein  . Myocardial infarction   . Hypertension   . Ventricular tachycardia     recurrent slow VT at 100-110 bpm,  Rx mexilitene/sotalol  . Left bundle branch block   . Systolic CHF, chronic   . Arthritis   . Gynecomastia     2/2 spironolactone  . Atrial fibrillation   . Pericarditis     due to perforation  . Inguinal hernia recurrent bilateral   . GERD (gastroesophageal reflux disease)   . Anemia 05/2014    Past Surgical History  Procedure Laterality Date  . Icd  Feb 2012    Change out with LV lead placed with tunneling from the right  to the left side  . Endovascular stent insertion      for AAA  . Cardiac catheterization      x2  . Pacemaker insertion    . Ventricular ablation surgery      s/p prior EPS and [facility] at [facility] 9/12, [facility] 11/12, and most recent [facility] 08[facility]11  . Tonsillectomy    . V-tach ablation N/A 08/24/2011    Procedure: V-TACH ABLATION;  Surgeon: Thompson Grayer, MD;  Location: [facility] CATH LAB;  Service: Cardiovascular;  Laterality: N/A;  . Inguinal hernia repair Bilateral 06/20/2014    Procedure: LAPAROSCOPIC BILATERAL INGUINAL HERNIA REPAIR;  Surgeon: Michael Boston, MD;  Location: [place];  Service: General;  Laterality: Bilateral;  . Insertion of mesh N/A 06/20/2014    Procedure: INSERTION OF MESH;  Surgeon: Michael Boston, MD;  Location: [facility];  Service: General;  Laterality: N/A;  History   Social History  . Marital Status: Married    Spouse Name: N/A    Number of Children: N/A  . Years of Education: N/A   Occupational History  . retired      used to Armed forces operational officer   Social History Main Topics  . Smoking status: Former Smoker    Quit date: 01/07/1994  . Smokeless tobacco: Never Used  . Alcohol Use: Yes     Comment: very rare  . Drug Use: No  . Sexual Activity: Not Currently   Other Topics Concern  . Not on file   Social History Narrative     Family History  Problem Relation Age of Onset  . Stroke Mother   . Heart attack Mother   . Heart disease Mother   . Hyperlipidemia Mother   . Hypertension Mother   . Heart attack Father   . Heart disease Father   . Hyperlipidemia Father   . Hypertension Father   . Heart disease Sister   . Hyperlipidemia Sister   . Hypertension Brother     Current Facility-Administered Medications  Medication Dose Route Frequency Provider Last Rate Last Dose  . 0.9 %  sodium chloride infusion   Intravenous Continuous Michael Boston, MD 50 mL/hr at 06/27/14 1416    . acetaminophen (TYLENOL) tablet 1,000 mg  1,000 mg Oral TID Michael Boston, MD   1,000 mg at 06/27/14 2125  . alum & mag hydroxide-simeth (MAALOX/MYLANTA) 200-200-20 MG/5ML suspension 30 mL  30 mL Oral Q6H PRN Michael Boston, MD      . bisacodyl (DULCOLAX) suppository 10 mg  10 mg Rectal Q12H PRN Michael Boston, MD      . diphenhydrAMINE (BENADRYL) injection 12.5 mg  12.5 mg Intravenous Q6H PRN Michael Boston, MD       Or  . diphenhydrAMINE (BENADRYL) 12.5 MG/5ML elixir 12.5 mg  12.5 mg Oral Q6H PRN Michael Boston, MD      . famotidine (PEPCID) tablet 20 mg  20 mg Oral Daily Michael Boston, MD   20 mg at 06/27/14 1545  . fentaNYL (SUBLIMAZE) injection 25-50 mcg  25-50 mcg Intravenous Q1H PRN Michael Boston, MD      . lactated ringers bolus 1,000 mL  1,000 mL Intravenous Q8H PRN Michael Boston, MD      . Derrill Memo ON 06/28/2014] levothyroxine (SYNTHROID, LEVOTHROID) tablet 132 mcg  132 mcg Oral Once per day on Fri Michael Boston, MD      . Derrill Memo ON 06/29/2014] levothyroxine (SYNTHROID, LEVOTHROID) tablet 88 mcg  88 mcg Oral Once per day on Sun Mon Tue Wed Thu Sat Michael Boston, MD      . lip balm (BLISTEX) ointment   Topical BID Michael Boston, MD   1 application at 09/98/33 2126  . loratadine (CLARITIN) tablet 10 mg  10 mg Oral Daily Michael Boston, MD   10 mg at 06/27/14 1545  . magic mouthwash  15 mL Oral QID PRN Michael Boston, MD      . menthol-cetylpyridinium  (CEPACOL) lozenge 3 mg  1 lozenge Oral PRN Michael Boston, MD      . mexiletine (MEXITIL) capsule 150 mg  150 mg Oral 3 times per day Lelon Perla, MD   150 mg at 06/27/14 2125  . multivitamin with minerals tablet 1 tablet  1 tablet Oral Daily Michael Boston, MD   1 tablet at 06/27/14 1544  . ondansetron (ZOFRAN) injection 4 mg  4 mg Intravenous Q6H PRN Michael Boston, MD      .  phenol (CHLORASEPTIC) mouth spray 2 spray  2 spray Mouth/Throat PRN Michael Boston, MD      . polyethylene glycol (MIRALAX / GLYCOLAX) packet 17 g  17 g Oral BID Michael Boston, MD   17 g at 06/27/14 2125  . saccharomyces boulardii (FLORASTOR) capsule 250 mg  250 mg Oral BID Michael Boston, MD   250 mg at 06/27/14 1150  . traMADol (ULTRAM) tablet 50-100 mg  50-100 mg Oral Q12H PRN Michael Boston, MD      . Derrill Memo ON 06/28/2014] vitamin C (ASCORBIC ACID) tablet 500 mg  500 mg Oral Daily Michael Boston, MD         Allergies  Allergen Reactions  . Iohexol Hives and Itching     Code: HIVES, Desc: hives w/ itching during cardiac cath '99, mult caths since w/ ? premeds, DR.G.HAYES REQUESTS 13 HR PRE MED//A.C.pt okay w/13 hr prep/mms, Onset Date: [id number]   . Prednisone Hives    BP 119/55 mmHg  Pulse 76  Temp(Src) 98.9 F (37.2 C) (Oral)  Resp 24  Ht 5\' 8"  (1.727 m)  Wt 177 lb 12.8 oz (80.65 kg)  BMI 27.04 kg/m2  SpO2 99%  Dg Chest 2 View  06/15/2014   CLINICAL DATA:  Initial evaluation for shortness of breath.  Acute.  EXAM: CHEST  2 VIEW  COMPARISON:  Prior study from 01/20/2014  FINDINGS: Left-sided transvenous pacemaker/AICD is stable. Cardiomegaly unchanged. Mediastinal silhouette within normal limits. Tortuosity of the intrathoracic aorta noted.  Lungs are normally inflated. There is increased pulmonary vascular congestion with indistinctness of the interstitial markings, compatible with diffuse interstitial edema. No definite focal infiltrates. No pleural effusion. No pneumothorax.  No acute osseus abnormality.  IMPRESSION:  Stable cardiomegaly with findings most consistent with mild to moderate diffuse pulmonary interstitial edema.   Electronically Signed   By: Jeannine Boga M.D.   On: 06/15/2014 02:21    Note: This dictation was prepared with Dragon/digital dictation along with [employer]. Any transcriptional errors that result from this process are unintentional.

## 2014-06-28 ENCOUNTER — Inpatient Hospital Stay (HOSPITAL_COMMUNITY): Payer: Medicare Other

## 2014-06-28 DIAGNOSIS — I5022 Chronic systolic (congestive) heart failure: Secondary | ICD-10-CM

## 2014-06-28 LAB — BASIC METABOLIC PANEL
Anion gap: 7 (ref 5–15)
BUN: 64 mg/dL — ABNORMAL HIGH (ref 6–23)
CALCIUM: 8.9 mg/dL (ref 8.4–10.5)
CHLORIDE: 94 mmol/L — AB (ref 96–112)
CO2: 28 mmol/L (ref 19–32)
CREATININE: 3.5 mg/dL — AB (ref 0.50–1.35)
GFR calc Af Amer: 19 mL/min — ABNORMAL LOW (ref >90)
GFR, EST NON AFRICAN AMERICAN: 16 mL/min — AB (ref >90)
GLUCOSE: 114 mg/dL — AB (ref 70–99)
Potassium: 4.9 mmol/L (ref 3.5–5.1)
Sodium: 129 mmol/L — ABNORMAL LOW (ref 135–145)

## 2014-06-28 LAB — TYPE AND SCREEN
ABO/RH(D): A POS
Antibody Screen: NEGATIVE
UNIT DIVISION: 0
UNIT DIVISION: 0

## 2014-06-28 LAB — HEMOGLOBIN: Hemoglobin: 8.3 g/dL — ABNORMAL LOW (ref 13.0–17.0)

## 2014-06-28 LAB — PROTIME-INR
INR: 2.57 — ABNORMAL HIGH (ref 0.00–1.49)
PROTHROMBIN TIME: 27.8 s — AB (ref 11.6–15.2)

## 2014-06-28 LAB — CBC
HEMATOCRIT: 23.2 % — AB (ref 39.0–52.0)
Hemoglobin: 8.1 g/dL — ABNORMAL LOW (ref 13.0–17.0)
MCH: 32 pg (ref 26.0–34.0)
MCHC: 34.9 g/dL (ref 30.0–36.0)
MCV: 91.7 fL (ref 78.0–100.0)
PLATELETS: 265 10*3/uL (ref 150–400)
RBC: 2.53 MIL/uL — ABNORMAL LOW (ref 4.22–5.81)
RDW: 14.5 % (ref 11.5–15.5)
WBC: 16.5 10*3/uL — AB (ref 4.0–10.5)

## 2014-06-28 MED ORDER — ROSUVASTATIN CALCIUM 5 MG PO TABS
5.0000 mg | ORAL_TABLET | ORAL | Status: DC
  Filled 2014-06-28 (×2): qty 1

## 2014-06-28 MED ORDER — FENTANYL CITRATE 0.05 MG/ML IJ SOLN: 25.0000 ug | INTRAMUSCULAR | Status: DC | PRN

## 2014-06-28 MED ORDER — SODIUM CHLORIDE 0.9 % IJ SOLN: 3.0000 mL | INTRAMUSCULAR | Status: DC | PRN

## 2014-06-28 MED ORDER — ALPRAZOLAM 0.25 MG PO TABS: 0.1250 mg | ORAL_TABLET | ORAL | Status: DC

## 2014-06-28 MED ORDER — SODIUM CHLORIDE 0.9 % IJ SOLN
3.0000 mL | Freq: Two times a day (BID) | INTRAMUSCULAR | Status: DC
  Administered 2014-06-28 – 2014-07-04 (×7): 3 mL via INTRAVENOUS

## 2014-06-28 MED ORDER — ALPRAZOLAM 0.25 MG PO TABS
0.1250 mg | ORAL_TABLET | ORAL | Status: DC
  Administered 2014-06-28 – 2014-07-01 (×6): 0.125 mg via ORAL
  Filled 2014-06-28 (×6): qty 1

## 2014-06-28 MED ORDER — TRAMADOL HCL 50 MG PO TABS: 50.0000 mg | ORAL_TABLET | Freq: Three times a day (TID) | ORAL | Status: DC | PRN

## 2014-06-28 MED ORDER — SODIUM CHLORIDE 0.9 % IV SOLN: 250.0000 mL | INTRAVENOUS | Status: DC | PRN

## 2014-06-28 MED ORDER — ENSURE COMPLETE PO LIQD
237.0000 mL | Freq: Three times a day (TID) | ORAL | Status: DC
  Administered 2014-06-28 (×3): 237 mL via ORAL

## 2014-06-28 MED ORDER — POLYSACCHARIDE IRON COMPLEX 150 MG PO CAPS
150.0000 mg | ORAL_CAPSULE | Freq: Every day | ORAL | Status: DC
  Administered 2014-06-28: 150 mg via ORAL
  Filled 2014-06-28 (×2): qty 1

## 2014-06-28 MED ORDER — MECLIZINE HCL 12.5 MG PO TABS
12.5000 mg | ORAL_TABLET | Freq: Three times a day (TID) | ORAL | Status: DC | PRN
  Filled 2014-06-28 (×2): qty 1

## 2014-06-28 MED ORDER — ALPRAZOLAM 0.25 MG PO TABS
0.2500 mg | ORAL_TABLET | ORAL | Status: DC
  Administered 2014-06-28: 0.25 mg via ORAL
  Filled 2014-06-28: qty 1

## 2014-06-28 MED ORDER — POLYETHYLENE GLYCOL 3350 17 G PO PACK
17.0000 g | PACK | Freq: Two times a day (BID) | ORAL | Status: DC
  Filled 2014-06-28: qty 1

## 2014-06-28 MED ORDER — POLYETHYLENE GLYCOL 3350 17 G PO PACK: 17.0000 g | PACK | Freq: Every day | ORAL | Status: DC

## 2014-06-28 NOTE — Evaluation (Signed)
Physical Therapy Evaluation Patient Details Name: [name] MRN: [id number] DOB: Sep 28, 1940 Today's Date: 06/28/2014   History of Present Illness  Pt adm with symptomatic anemia due to abdominal bleeding after hernia repair on 1/21. PMH - CHF, VT, CAD  Clinical Impression  Pt doing fairly well with mobility and should be able to return home with wife. No PT needed after DC. Encouraged pt to amb several times a day with wife or staff to increase endurance. Will follow acutely for PT.    Follow Up Recommendations No PT follow up    Equipment Recommendations  None recommended by PT    Recommendations for Other Services       Precautions / Restrictions        Mobility  Bed Mobility Overal bed mobility: Needs Assistance Bed Mobility: Supine to Sit     Supine to sit: Min assist;HOB elevated     General bed mobility comments: Assist to elevate trunk.  Transfers Overall transfer level: Needs assistance Equipment used: Rolling walker (2 wheeled);None Transfers: Sit to/from Stand Sit to Stand: Min assist         General transfer comment: Assist to bring hips up. Verbal cues to turn all the way around before sitting on bed.  Ambulation/Gait Ambulation/Gait assistance: Min assist;Min guard Ambulation Distance (Feet): 200 Feet Assistive device: Rolling walker (2 wheeled);None Gait Pattern/deviations: Step-through pattern;Decreased step length - right;Decreased step length - left;Trunk flexed   Gait velocity interpretation: Below normal speed for age/gender General Gait Details: Verbal cues to stand more erect. Without walker pt min A and reaching for objects to hold onto. With walker improved stability.  Stairs            Wheelchair Mobility    Modified Rankin (Stroke Patients Only)       Balance Overall balance assessment: Needs assistance Sitting-balance support: No upper extremity supported;Feet supported Sitting balance-Leahy Scale: Normal      Standing balance support: No upper extremity supported Standing balance-Leahy Scale: Fair                               Pertinent Vitals/Pain Pain Assessment: Faces Faces Pain Scale: Hurts little more Pain Location: abdomen Pain Descriptors / Indicators: Sore Pain Intervention(s): Limited activity within patient's tolerance;Monitored during session;Repositioned;Heat applied    Home Living Family/patient expects to be discharged to:: Private residence Living Arrangements: Spouse/significant other Available Help at Discharge: Family Type of Home: House Home Access: Stairs to enter   [employer] of Steps: several Home Layout: Two level;Able to live on main level with bedroom/bathroom Home Equipment: Walker - 2 wheels      Prior Function Level of Independence: Independent         Comments: prior to surgery. After dc home from surgery pt independent with mobility and going up/down stairs without difficulty until becoming ill.     Hand Dominance        Extremity/Trunk Assessment   Upper Extremity Assessment: Defer to OT evaluation           Lower Extremity Assessment: Generalized weakness         Communication   Communication: No difficulties  Cognition Arousal/Alertness: Awake/alert Behavior During Therapy: WFL for tasks assessed/performed Overall Cognitive Status: Within Functional Limits for tasks assessed                      General Comments      Exercises  Assessment/Plan    PT Assessment Patient needs continued PT services  PT Diagnosis Difficulty walking;Generalized weakness   PT Problem List Decreased strength;Decreased activity tolerance;Decreased balance;Decreased mobility;Decreased knowledge of use of DME  PT Treatment Interventions DME instruction;Balance training;Gait training;Stair training;Functional mobility training;Therapeutic activities;Therapeutic exercise;Cognitive remediation;Patient/family  education   PT Goals (Current goals can be found in the Care Plan section) Acute Rehab PT Goals Patient Stated Goal: return home PT Goal Formulation: With patient/family Time For Goal Achievement: 07/05/14 Potential to Achieve Goals: Good    Frequency Min 3X/week   Barriers to discharge        Co-evaluation               End of Session   Activity Tolerance: Patient tolerated treatment well Patient left: in bed;with call bell/phone within reach;with bed alarm set;with family/visitor present Nurse Communication: Mobility status         Time: 1415-1435 PT Time Calculation (min) (ACUTE ONLY): 20 min   Charges:   PT Evaluation $Initial PT Evaluation Tier I: 1 Procedure     PT G Codes:        [name] Jul 25, 2014, 4:15 PM  [facility] PT [phone]

## 2014-06-28 NOTE — Progress Notes (Signed)
PROGRESS NOTE  Subjective:    73 yo with hx of CAD, chronic systolic CHF, VT who is admitted with evidence of abdominal bleeding follow laproscopic hernia repair.  We were consulted to follow along to watch for any signs of CHF.  His Hb has improved from 7.5 to 8.1 following transfusion.  He still has significant renal insufficiency secondary to anemia and hypovolemia.     Objective:    Vital Signs:   Temp:  [97.9 F (36.6 C)-99.1 F (37.3 C)] 97.9 F (36.6 C) (01/29 0824) Pulse Rate:  [40-76] 76 (01/28 1900) Resp:  [14-28] 14 (01/29 0824) BP: (86-119)/(36-55) 108/52 mmHg (01/29 0824) SpO2:  [92 %-100 %] 98 % (01/29 0824)  Last BM Date: 06/26/14   24-hour weight change: Weight change:   Weight trends: Filed Weights   06/27/14 0942  Weight: 177 lb 12.8 oz (80.65 kg)    Intake/Output:  01/28 0701 - 01/29 0700 In: 1367.5 [P.O.:100; I.V.:570; Blood:697.5] Out: 1100 [Urine:1100] Total I/O In: 300 [P.O.:240; I.V.:60] Out: -    Physical Exam: BP 108/52 mmHg  Pulse 76  Temp(Src) 97.9 F (36.6 C) (Oral)  Resp 14  Ht 5\' 8"  (1.727 m)  Wt 177 lb 12.8 oz (80.65 kg)  BMI 27.04 kg/m2  SpO2 98%  Wt Readings from Last 3 Encounters:  06/27/14 177 lb 12.8 oz (80.65 kg)  06/20/14 163 lb (73.936 kg)  06/18/14 168 lb (76.204 kg)    General: Vital signs reviewed and noted.  Elderly,    Head: Normocephalic, atraumatic.  Eyes: conjunctivae/corneas clear.  EOM's intact.   Throat: normal  Neck:  normal   Lungs:     Clear   Heart:  RR.   Abdomen:  + BS,   Extremities: No edema    Neurologic: A&O X3, CN II - XII are grossly intact. Moves all 4 ext.   Psych: ? Possible early dementia. Wife had to help with answers frequently     Labs: BMET:  Recent Labs  06/26/14 1407 06/28/14 0335  NA 124* 129*  K 5.4* 4.9  CL 89* 94*  CO2 25 28  GLUCOSE 163* 114*  BUN 47* 64*  CREATININE 3.05* 3.50*  CALCIUM 9.3 8.9    Liver function tests: No results for  input(s): AST, ALT, ALKPHOS, BILITOT, PROT, ALBUMIN in the last 72 hours. No results for input(s): LIPASE, AMYLASE in the last 72 hours.  CBC:  Recent Labs  06/26/14 1410 06/28/14 0335  WBC 15.8* 16.5*  NEUTROABS 13.1*  --   HGB 7.5* 8.1*  HCT 21.8* 23.2*  MCV 93.2 91.7  PLT 332 265    Cardiac Enzymes: No results for input(s): CKTOTAL, CKMB, TROPONINI in the last 72 hours.  Coagulation Studies:  Recent Labs  06/25/14 1000 06/27/14 1120 06/28/14 0335  LABPROT  --  31.3* 27.8*  INR 1.9 2.99* 2.57*    Other: Invalid input(s): POCBNP No results for input(s): DDIMER in the last 72 hours. No results for input(s): HGBA1C in the last 72 hours. No results for input(s): CHOL, HDL, LDLCALC, TRIG, CHOLHDL in the last 72 hours. No results for input(s): TSH, T4TOTAL, T3FREE, THYROIDAB in the last 72 hours.  Invalid input(s): FREET3 No results for input(s): VITAMINB12, FOLATE, FERRITIN, TIBC, IRON, RETICCTPCT in the last 72 hours.   Other results:  Tele:  AV pacing.  Medications:    Infusions: . sodium chloride 50 mL/hr at 06/27/14 1416    Scheduled Medications: . acetaminophen  1,000 mg  Oral TID  . ALPRAZolam  0.125 mg Oral 4 times per day  . ALPRAZolam  0.25 mg Oral 2 times per day  . famotidine  20 mg Oral Daily  . feeding supplement (ENSURE COMPLETE)  237 mL Oral TID WC  . levothyroxine  132 mcg Oral Once per day on Fri  . [START ON 06/29/2014] levothyroxine  88 mcg Oral Once per day on Sun Mon Tue Wed Thu Sat  . lip balm   Topical BID  . loratadine  10 mg Oral Daily  . mexiletine  150 mg Oral 3 times per day  . multivitamin with minerals  1 tablet Oral Daily  . polyethylene glycol  17 g Oral Daily  . [START ON 06/29/2014] rosuvastatin  5 mg Oral Once per day on Tue Sat  . saccharomyces boulardii  250 mg Oral BID  . sodium chloride  3 mL Intravenous Q12H  . vitamin C  500 mg Oral Daily    Assessment/ Plan:     1.   Symptomatic anemia -  All of his symptoms  can be attributed to anemia and hypoperfusion.  I do not see any evidence of CHF exacerbation at this point.  He is feeling better after transfusion of 2 units of PRBC.    Will allow his INR to drift down. If his Hb drops , then that would suggest that he has continued to bleed and that we should actively reverse his coumadin Further eval. Per general surgery. Will start Nu-iron.   2. Chronic systolic CHF:  No signs of CHF at this time. Holding lasix for now.  3. Chronic anticoagulation:  Will allow his INR to drift down.  If he drops his Hb, we should actively reverse his coumadin.    4. Acute renal insufficiency:  Likely caused by hypoperfusion.    Disposition:  Length of Stay: 1  Thayer Headings, Brooke Bonito., MD, [facility] 06/28/2014, 9:51 AM Office [phone] Pager [phone]

## 2014-06-28 NOTE — Progress Notes (Signed)
[facility]  [address] [place], [place] [address] Phone: [phone] FAX: [place] [phone] 08-08-1940  CARE TEAM:  PCP: Sheela Stack, MD  Outpatient Care Team: Patient Care Team: Sheela Stack, MD as PCP - General (Endocrinology) Darlin Coco, MD as Consulting Physician (Cardiology) Deboraha Sprang, MD as Consulting Physician (Cardiology) Serafina Mitchell, MD as Consulting Physician (Vascular Surgery) Michael Boston, MD as Consulting Physician (General Surgery)  Inpatient Treatment Team: Treatment Team: Attending Provider: Michael Boston, MD; Consulting Physician: Michae Kava Lbcardiology, MD; Technician: Francene Finders, NT; Registered Nurse: Renford Dills, RN   Subjective:  Tired but better Tol clears Up in chair Having ## flatus  Objective:  Vital signs:  Filed Vitals:   06/27/14 2000 06/27/14 2330 06/28/14 0337 06/28/14 0400  BP:      Pulse:      Temp:  98.3 F (36.8 C) 99.1 F (37.3 C)   TempSrc:  Oral Oral   Resp:    18  Height:      Weight:      SpO2: 99% 98%      Last BM Date: 06/26/14  Intake/Output   Yesterday:  01/28 0701 - 01/29 0700 In: 1367.5 [P.O.:100; I.V.:570; Blood:697.5] Out: 1100 [Urine:1100] This shift:  Total I/O In: 650 [P.O.:100; I.V.:550] Out: 800 [Urine:800]  Bowel function:  Flatus: y  BM: n  Drain: n/a  Physical Exam:  General: Pt awake/alert/oriented x4 in no acute distress.  Mildly tired but less pale - better Eyes: PERRL, normal EOM.  Sclera clear.  No icterus Neuro: CN II-XII intact w/o focal sensory/motor deficits. Lymph: No head/neck/groin lymphadenopathy Psych:  No delerium/psychosis/paranoia.  More alert.  Not confused HENT: Normocephalic, Mucus membranes moist.  No thrush Neck: Supple, No tracheal deviation Chest: No chest wall pain w good excursion CV:  Pulses intact.  Regular rhythm MS: Normal AROM mjr joints.  No  obvious deformity Abdomen: Soft.  Mildly distended.  Mildly tender at incisions only.  Large ecchymosis in flank but stable & a little softer.  No evidence of peritonitis.  No incarcerated hernias. GU:  NEMG.  Ecchymosis but min edema Ext:  SCDs BLE.  No mjr edema.  No cyanosis Skin: No petechiae / purpura   Problem List:   Active Problems:   Symptomatic anemia   Shortness of breath   Assessment  [name]  74 y.o. male       Improving  Plan:  -adv diet -bowel regimen -prob OK to move to telemetry -follow Hgb closely - transfuse if worsens.  Doubt active bleed -iron -VTE prophylaxis- SCDs, etc -mobilize as tolerated to help recovery.  Get PT/OT involved  [name], M.D., F.A.C.S. Gastrointestinal and Minimally Invasive Surgery [facility] [address] [place], [place] [address] [phone] Main / Paging   06/28/2014   Results:   Labs: Results for orders placed or performed during the hospital encounter of 06/27/14 (from the past 48 hour(s))  Type and screen     Status: None (Preliminary result)   Collection Time: 06/27/14 11:20 AM  Result Value Ref Range   ABO/RH(D) A POS    Antibody Screen NEG    Sample Expiration 06/30/2014    Unit Number [id number]    Blood Component Type RED CELLS,LR    Unit division 00    Status of Unit ISSUED    Transfusion Status OK TO TRANSFUSE  Crossmatch Result Compatible    Unit Number [id number]    Blood Component Type RED CELLS,LR    Unit division 00    Status of Unit ISSUED    Transfusion Status OK TO TRANSFUSE    Crossmatch Result Compatible   Prepare RBC     Status: None   Collection Time: 06/27/14 11:20 AM  Result Value Ref Range   Order Confirmation ORDER PROCESSED BY BLOOD BANK   Protime-INR     Status: Abnormal   Collection Time: 06/27/14 11:20 AM  Result Value Ref Range   Prothrombin Time 31.3 (H) 11.6 - 15.2 seconds   INR 2.99 (H) 0.00 - 1.49   MRSA PCR Screening     Status: None   Collection Time: 06/27/14  2:47 PM  Result Value Ref Range   MRSA by PCR NEGATIVE NEGATIVE    Comment:        The GeneXpert MRSA Assay (FDA approved for NASAL specimens only), is one component of a comprehensive MRSA colonization surveillance program. It is not intended to diagnose MRSA infection nor to guide or monitor treatment for MRSA infections.   Basic metabolic panel     Status: Abnormal   Collection Time: 06/28/14  3:35 AM  Result Value Ref Range   Sodium 129 (L) 135 - 145 mmol/L   Potassium 4.9 3.5 - 5.1 mmol/L   Chloride 94 (L) 96 - 112 mmol/L   CO2 28 19 - 32 mmol/L   Glucose, Bld 114 (H) 70 - 99 mg/dL   BUN 64 (H) 6 - 23 mg/dL   Creatinine, Ser 3.50 (H) 0.50 - 1.35 mg/dL   Calcium 8.9 8.4 - 10.5 mg/dL   GFR calc non Af Amer 16 (L) >90 mL/min   GFR calc Af Amer 19 (L) >90 mL/min    Comment: (NOTE) The eGFR has been calculated using the CKD EPI equation. This calculation has not been validated in all clinical situations. eGFR's persistently <90 mL/min signify possible Chronic Kidney Disease.    Anion gap 7 5 - 15  CBC     Status: Abnormal   Collection Time: 06/28/14  3:35 AM  Result Value Ref Range   WBC 16.5 (H) 4.0 - 10.5 K/uL   RBC 2.53 (L) 4.22 - 5.81 MIL/uL   Hemoglobin 8.1 (L) 13.0 - 17.0 g/dL   HCT 23.2 (L) 39.0 - 52.0 %   MCV 91.7 78.0 - 100.0 fL   MCH 32.0 26.0 - 34.0 pg   MCHC 34.9 30.0 - 36.0 g/dL   RDW 14.5 11.5 - 15.5 %   Platelets 265 150 - 400 K/uL  Protime-INR     Status: Abnormal   Collection Time: 06/28/14  3:35 AM  Result Value Ref Range   Prothrombin Time 27.8 (H) 11.6 - 15.2 seconds   INR 2.57 (H) 0.00 - 1.49    Imaging / Studies: No results found.  Medications / Allergies: per chart  Antibiotics: Anti-infectives    None       Note: Portions of this report may have been transcribed using voice recognition software. Every effort was made to ensure accuracy; however, inadvertent  computerized transcription errors may be present.   Any transcriptional errors that result from this process are unintentional.

## 2014-06-29 ENCOUNTER — Inpatient Hospital Stay (HOSPITAL_COMMUNITY): Payer: Medicare Other

## 2014-06-29 LAB — BASIC METABOLIC PANEL
Anion gap: 7 (ref 5–15)
BUN: 48 mg/dL — ABNORMAL HIGH (ref 6–23)
CHLORIDE: 93 mmol/L — AB (ref 96–112)
CO2: 31 mmol/L (ref 19–32)
Calcium: 9.6 mg/dL (ref 8.4–10.5)
Creatinine, Ser: 2.21 mg/dL — ABNORMAL HIGH (ref 0.50–1.35)
GFR calc Af Amer: 32 mL/min — ABNORMAL LOW (ref >90)
GFR calc non Af Amer: 28 mL/min — ABNORMAL LOW (ref >90)
Glucose, Bld: 150 mg/dL — ABNORMAL HIGH (ref 70–99)
POTASSIUM: 4.9 mmol/L (ref 3.5–5.1)
SODIUM: 131 mmol/L — AB (ref 135–145)

## 2014-06-29 LAB — HEMOGLOBIN: Hemoglobin: 9.8 g/dL — ABNORMAL LOW (ref 13.0–17.0)

## 2014-06-29 LAB — CBC
HCT: 29.2 % — ABNORMAL LOW (ref 39.0–52.0)
HEMOGLOBIN: 9.8 g/dL — AB (ref 13.0–17.0)
MCH: 32.7 pg (ref 26.0–34.0)
MCHC: 33.6 g/dL (ref 30.0–36.0)
MCV: 97.3 fL (ref 78.0–100.0)
Platelets: 364 10*3/uL (ref 150–400)
RBC: 3 MIL/uL — AB (ref 4.22–5.81)
RDW: 15.8 % — ABNORMAL HIGH (ref 11.5–15.5)
WBC: 14.5 10*3/uL — ABNORMAL HIGH (ref 4.0–10.5)

## 2014-06-29 LAB — BRAIN NATRIURETIC PEPTIDE: B Natriuretic Peptide: 1486.2 pg/mL — ABNORMAL HIGH (ref 0.0–100.0)

## 2014-06-29 LAB — PROTIME-INR
INR: 1.65 — ABNORMAL HIGH (ref 0.00–1.49)
Prothrombin Time: 19.6 seconds — ABNORMAL HIGH (ref 11.6–15.2)

## 2014-06-29 LAB — CLOSTRIDIUM DIFFICILE BY PCR: Toxigenic C. Difficile by PCR: POSITIVE — AB

## 2014-06-29 MED ORDER — POLYETHYLENE GLYCOL 3350 17 G PO PACK
17.0000 g | PACK | Freq: Every day | ORAL | Status: DC
  Filled 2014-06-29: qty 1

## 2014-06-29 MED ORDER — PROMETHAZINE HCL 25 MG/ML IJ SOLN
12.5000 mg | Freq: Four times a day (QID) | INTRAMUSCULAR | Status: DC | PRN
  Administered 2014-06-29: 12.5 mg via INTRAVENOUS
  Filled 2014-06-29: qty 1

## 2014-06-29 MED ORDER — KCL IN DEXTROSE-NACL 20-5-0.45 MEQ/L-%-% IV SOLN
INTRAVENOUS | Status: DC
  Administered 2014-06-29: 12:00:00 via INTRAVENOUS
  Administered 2014-06-30: 50 mL via INTRAVENOUS
  Administered 2014-07-01: 08:00:00 via INTRAVENOUS
  Filled 2014-06-29 (×6): qty 1000

## 2014-06-29 MED ORDER — CARVEDILOL 3.125 MG PO TABS
3.1250 mg | ORAL_TABLET | Freq: Two times a day (BID) | ORAL | Status: DC
  Administered 2014-07-01 – 2014-07-05 (×8): 3.125 mg via ORAL
  Filled 2014-06-29 (×14): qty 1

## 2014-06-29 NOTE — Progress Notes (Addendum)
0550 Patient c/o of nausea and belching. Zofran 4mg  given IV. 0630 Patient state he was not belching anymore. Wife in at [phone] Patient vomited small amount of bile. Dr. Lucia Bitter notified and order phenergan. Wife and patient told that phenergan was order and that it may make him very drowsy. Wife stated patient is sensitive to medication and due to his heart history she did not want him to have the phenergan. Will hold off in putting the order until MD sees him this am. Will continue to monitor.

## 2014-06-29 NOTE — Progress Notes (Addendum)
CXR clear, coarse sounds may be all related to upper airway and recent N/V. No evidence of CHF by CXR or physical exam, labs continue to support hypovolemia. BNP elevated but just slightly above baseline.  Will continue to hold diuretics at this time.   Zandra Abts MD

## 2014-06-29 NOTE — Progress Notes (Signed)
[facility]  [address] [place], [place] [address] Phone: [phone] FAX: [place] [phone] [birth year]  CARE TEAM:  PCP: Sheela Stack, MD  Outpatient Care Team: Patient Care Team: Sheela Stack, MD as PCP - General (Endocrinology) Darlin Coco, MD as Consulting Physician (Cardiology) Deboraha Sprang, MD as Consulting Physician (Cardiology) Serafina Mitchell, MD as Consulting Physician (Vascular Surgery) Michael Boston, MD as Consulting Physician (General Surgery)  Inpatient Treatment Team: Treatment Team: Attending Provider: Michael Boston, MD; Consulting Physician: Michae Kava Lbcardiology, MD; Technician: Francene Finders, NT; Registered Nurse: Renford Dills, RN; Registered Nurse: Suzan Nailer, RN; Technician: Army Chaco, NT; Registered Nurse: Wendall Mola, RN; Technician: Redmond School, NT   Subjective:  Had a lot of diarrhea overnight and nausea and vomiting this am.    Objective:  Vital signs:  Filed Vitals:   06/28/14 1638 06/28/14 1712 06/28/14 2126 06/29/14 0530  BP: 119/47 98/46 102/48 122/63  Pulse:  74 73 74  Temp: 98.8 F (37.1 C) 98.8 F (37.1 C) 98.8 F (37.1 C) 98.6 F (37 C)  TempSrc: Oral Oral Oral Oral  Resp: '24 21 18 20  ' Height:      Weight:      SpO2: 96% 96% 100% 100%    Last BM Date: 06/28/13  Intake/Output   Yesterday:  01/29 0701 - 01/30 0700 In: 970.8 [P.O.:540; I.V.:430.8] Out: 150 [Urine:150] This shift:  Total I/O In: -  Out: 1 [Emesis/NG output:1]  Bowel function:  Flatus: y  BM: n  Drain: n/a  Physical Exam:  General: NAD, looks tired Abd: distended but soft, ecchymosis noted throughout lower abd and pubic region   Problem List:   Active Problems:   Symptomatic anemia   Shortness of breath   Assessment  [name]  74 y.o. male        Plan:  -Axr looks like ileus, gas noted in colon.  Will d/c all  nonessential PO meds, make NPO and check wbc today.   Hgb stable.  Min MIV fluids.  If continued ileus, will need NG tube -stop bowel regimen -follow Hgb closely - transfuse if worsens.  Doubt active bleed -VTE prophylaxis- SCDs, etc -mobilize as tolerated to help recovery.    Rosario Adie, MD  Colorectal and General Surgery [facility]  [place], [place] [address] [phone] Main / Paging   06/29/2014   Results:   Labs: Results for orders placed or performed during the hospital encounter of 06/27/14 (from the past 48 hour(s))  Type and screen     Status: None   Collection Time: 06/27/14 11:20 AM  Result Value Ref Range   ABO/RH(D) A POS    Antibody Screen NEG    Sample Expiration 06/30/2014    Unit Number [id number]    Blood Component Type RED CELLS,LR    Unit division 00    Status of Unit ISSUED,FINAL    Transfusion Status OK TO TRANSFUSE    Crossmatch Result Compatible    Unit Number [id number]    Blood Component Type RED CELLS,LR    Unit division 00    Status of Unit ISSUED,FINAL    Transfusion Status OK TO TRANSFUSE    Crossmatch Result Compatible   Prepare RBC     Status: None   Collection Time: 06/27/14 11:20 AM  Result Value Ref Range   Order Confirmation ORDER PROCESSED BY BLOOD BANK  Protime-INR     Status: Abnormal   Collection Time: 06/27/14 11:20 AM  Result Value Ref Range   Prothrombin Time 31.3 (H) 11.6 - 15.2 seconds   INR 2.99 (H) 0.00 - 1.49  MRSA PCR Screening     Status: None   Collection Time: 06/27/14  2:47 PM  Result Value Ref Range   MRSA by PCR NEGATIVE NEGATIVE    Comment:        The GeneXpert MRSA Assay (FDA approved for NASAL specimens only), is one component of a comprehensive MRSA colonization surveillance program. It is not intended to diagnose MRSA infection nor to guide or monitor treatment for MRSA infections.   Basic metabolic panel     Status: Abnormal   Collection Time: 06/28/14  3:35 AM   Result Value Ref Range   Sodium 129 (L) 135 - 145 mmol/L   Potassium 4.9 3.5 - 5.1 mmol/L   Chloride 94 (L) 96 - 112 mmol/L   CO2 28 19 - 32 mmol/L   Glucose, Bld 114 (H) 70 - 99 mg/dL   BUN 64 (H) 6 - 23 mg/dL   Creatinine, Ser 3.50 (H) 0.50 - 1.35 mg/dL   Calcium 8.9 8.4 - 10.5 mg/dL   GFR calc non Af Amer 16 (L) >90 mL/min   GFR calc Af Amer 19 (L) >90 mL/min    Comment: (NOTE) The eGFR has been calculated using the CKD EPI equation. This calculation has not been validated in all clinical situations. eGFR's persistently <90 mL/min signify possible Chronic Kidney Disease.    Anion gap 7 5 - 15  CBC     Status: Abnormal   Collection Time: 06/28/14  3:35 AM  Result Value Ref Range   WBC 16.5 (H) 4.0 - 10.5 K/uL   RBC 2.53 (L) 4.22 - 5.81 MIL/uL   Hemoglobin 8.1 (L) 13.0 - 17.0 g/dL   HCT 23.2 (L) 39.0 - 52.0 %   MCV 91.7 78.0 - 100.0 fL   MCH 32.0 26.0 - 34.0 pg   MCHC 34.9 30.0 - 36.0 g/dL   RDW 14.5 11.5 - 15.5 %   Platelets 265 150 - 400 K/uL  Protime-INR     Status: Abnormal   Collection Time: 06/28/14  3:35 AM  Result Value Ref Range   Prothrombin Time 27.8 (H) 11.6 - 15.2 seconds   INR 2.57 (H) 0.00 - 1.49  Hemoglobin     Status: Abnormal   Collection Time: 06/28/14  3:03 PM  Result Value Ref Range   Hemoglobin 8.3 (L) 13.0 - 17.0 g/dL  Protime-INR     Status: Abnormal   Collection Time: 06/29/14  4:30 AM  Result Value Ref Range   Prothrombin Time 19.6 (H) 11.6 - 15.2 seconds   INR 1.65 (H) 0.00 - 1.49  Hemoglobin     Status: Abnormal   Collection Time: 06/29/14  4:30 AM  Result Value Ref Range   Hemoglobin 9.8 (L) 13.0 - 17.0 g/dL  Basic metabolic panel     Status: Abnormal   Collection Time: 06/29/14  4:30 AM  Result Value Ref Range   Sodium 131 (L) 135 - 145 mmol/L   Potassium 4.9 3.5 - 5.1 mmol/L   Chloride 93 (L) 96 - 112 mmol/L   CO2 31 19 - 32 mmol/L   Glucose, Bld 150 (H) 70 - 99 mg/dL   BUN 48 (H) 6 - 23 mg/dL   Creatinine, Ser 2.21 (H) 0.50 -  1.35 mg/dL  Comment: DELTA CHECK NOTED   Calcium 9.6 8.4 - 10.5 mg/dL   GFR calc non Af Amer 28 (L) >90 mL/min   GFR calc Af Amer 32 (L) >90 mL/min    Comment: (NOTE) The eGFR has been calculated using the CKD EPI equation. This calculation has not been validated in all clinical situations. eGFR's persistently <90 mL/min signify possible Chronic Kidney Disease.    Anion gap 7 5 - 15    Imaging / Studies: Dg Chest 2 View  06/28/2014   CLINICAL DATA:  Coronary artery disease.  EXAM: CHEST  2 VIEW  COMPARISON:  06/15/2014.  FINDINGS: Mediastinum and hilar structures are normal. Cardiac pacer in stable position. Stable cardiomegaly. No pulmonary venous congestion or interstitial edema noted on today's exam. Bibasilar subsegmental atelectasis. No pleural effusion or pneumothorax. Mild bowel distention noted in the upper abdomen. Abdominal series suggested to further evaluate. These changes may sec be secondary to adynamic ileus. Abdominal aortic stent graft noted.  IMPRESSION: 1. Interval clearing of congestive heart failure and interstitial edema. Mild left basilar segment atelectasis .  2. Mild bowel distention in the upper abdomen. These changes may be secondary to adynamic ileus. Abdominal series suggested to further evaluate. Abdominal aortic stent graft noted.   Electronically Signed   By: Marcello Moores  Register   On: 06/28/2014 07:59   Dg Abd Portable 2v  06/29/2014   CLINICAL DATA:  Acute onset of vomiting. Assess for ileus. Initial encounter.  EXAM: PORTABLE ABDOMEN - 2 VIEW  COMPARISON:  CT of the abdomen and pelvis performed 03/13/2013, and abdominal radiograph performed 11/24/2007  FINDINGS: There is mild distention of the stomach with air. Small bowel loops are dilated to 4.0 cm in maximal diameter, though air is still seen within the transverse colon. The appearance is suggestive of partial small bowel obstruction. There is no definite evidence for diffuse ileus, as the colon remains normal  in caliber. No free intra-abdominal air is identified on the provided decubitus view.  No acute osseous abnormalities are seen. An aortoiliac stent graft is noted.  IMPRESSION: Mild distention of the stomach with air. Small bowel loops are dilated to 4.0 cm in maximal diameter, though air is still seen within the transverse colon. The appearance is suggestive of partial small bowel obstruction. No free intra-abdominal air seen.   Electronically Signed   By: Garald Balding M.D.   On: 06/29/2014 09:26    Medications / Allergies: per chart  Antibiotics: Anti-infectives    None       Note: Portions of this report may have been transcribed using voice recognition software. Every effort was made to ensure accuracy; however, inadvertent computerized transcription errors may be present.   Any transcriptional errors that result from this process are unintentional.

## 2014-06-29 NOTE — Progress Notes (Signed)
Patient ID: [name], male   DOB: 1941-03-23, 74 y.o.   MRN: [id number]    Primary cardiologist:  Subjective:    + N/V  Objective:   Temp:  [97.6 F (36.4 C)-98.8 F (37.1 C)] 98.6 F (37 C) (01/30 0530) Pulse Rate:  [73-74] 74 (01/30 0530) Resp:  [18-24] 20 (01/30 0530) BP: (98-122)/(46-69) 122/63 mmHg (01/30 0530) SpO2:  [96 %-100 %] 100 % (01/30 0530) Last BM Date: 06/28/13  Filed Weights   06/27/14 0942  Weight: 177 lb 12.8 oz (80.65 kg)    Intake/Output Summary (Last 24 hours) at 06/29/14 0848 Last data filed at 06/29/14 0714  Gross per 24 hour  Intake 940.83 ml  Output    151 ml  Net 789.83 ml    Telemetry: SR, intermittent pacing  Exam:  General: NAD  Resp: coarse breath sounds bilaterally  Cardiac: RRR, no m/r/g, no JVD  GI: abdomen NT  MSK: no LE edema  Neuro: no focal deficits  Psych: appropriate affect  Lab Results:  Basic Metabolic Panel:  Recent Labs Lab 06/26/14 1407 06/28/14 0335 06/29/14 0430  NA 124* 129* 131*  K 5.4* 4.9 4.9  CL 89* 94* 93*  CO2 25 28 31   GLUCOSE 163* 114* 150*  BUN 47* 64* 48*  CREATININE 3.05* 3.50* 2.21*  CALCIUM 9.3 8.9 9.6    Liver Function Tests: No results for input(s): AST, ALT, ALKPHOS, BILITOT, PROT, ALBUMIN in the last 168 hours.  CBC:  Recent Labs Lab 06/26/14 1410 06/28/14 0335 06/28/14 1503 06/29/14 0430  WBC 15.8* 16.5*  --   --   HGB 7.5* 8.1* 8.3* 9.8*  HCT 21.8* 23.2*  --   --   MCV 93.2 91.7  --   --   PLT 332 265  --   --     Cardiac Enzymes: No results for input(s): CKTOTAL, CKMB, CKMBINDEX, TROPONINI in the last 168 hours.  BNP:  Recent Labs  12/05/13 0047 01/17/14 2153  PROBNP 2438.0* 3228.0*    Coagulation:  Recent Labs Lab 06/27/14 1120 06/28/14 0335 06/29/14 0430  INR 2.99* 2.57* 1.65*    ECG:   Medications:   Scheduled Medications: . acetaminophen  1,000 mg Oral TID  . ALPRAZolam  0.125 mg Oral 4 times per day  . famotidine  20 mg  Oral Daily  . feeding supplement (ENSURE COMPLETE)  237 mL Oral TID WC  . iron polysaccharides  150 mg Oral Daily  . levothyroxine  132 mcg Oral Once per day on Fri  . levothyroxine  88 mcg Oral Once per day on Sun Mon Tue Wed Thu Sat  . lip balm   Topical BID  . loratadine  10 mg Oral Daily  . mexiletine  150 mg Oral 3 times per day  . multivitamin with minerals  1 tablet Oral Daily  . polyethylene glycol  17 g Oral Daily  . rosuvastatin  5 mg Oral Once per day on Tue Sat  . saccharomyces boulardii  250 mg Oral BID  . sodium chloride  3 mL Intravenous Q12H  . vitamin C  500 mg Oral Daily     Infusions:     PRN Medications:  sodium chloride, alum & mag hydroxide-simeth, bisacodyl, diphenhydrAMINE **OR** diphenhydrAMINE, fentaNYL, lactated ringers, magic mouthwash, meclizine, menthol-cetylpyridinium, ondansetron, phenol, sodium chloride, traMADol     Assessment/Plan   74 yo with hx of CAD, chronic systolic CHF, VT who is admitted with evidence of abdominal bleeding follow laproscopic hernia repair.  We were consulted to follow along to watch for any signs of CHF.  His Hb has improved from 7.5 to 8.1 following transfusion.  He still has significant renal insufficiency secondary to anemia and hypovolemia   1. Anemia - s/p transfusion, management per primary team  2. Chronic systolic hF - echo 01/7947 LVEF 20-25%, grade II diastolic dysfunction.  - coreg, lasix, losartan, on hold due to borderline low blood pressures. BP have been stable overnight, will start back low dose coreg.  - coarse breath sounds bilaterally, unclear if fluid of possible aspiration for heavy N/V this AM. Will obtain CXR, add on BNP to AM labs. Rest of exam does not support fluid overload, labs would suggest still volume down.   3. Apical aneurysm - diagnosed 12/06/13, Hgb drop requiring transfusion. Anticoag on hold.   4. Hx of VT - hx of 3 prior ablations, most recentl in 07/2011 by Dr Rayann Heman. Has  been on mexilitene, sotalol, and also had ICD - amio caused issues with thyroid function in the past.  - off sotalol, notes reviewed and do not see reason for stopping. I assume due to his AKI. GFR remains less than 30. Continue mexilitene, will restart his coreg. Patient with ileus on xray this AM, N/V. Unclear if he will be able to keep pills down. No arrhythmias on monitor, will continue to follow at this time. If arrhythmias develop may consider IV therapy.     Carlyle Dolly, M.D.

## 2014-06-30 DIAGNOSIS — I472 Ventricular tachycardia: Secondary | ICD-10-CM

## 2014-06-30 LAB — PROTIME-INR
INR: 1.57 — ABNORMAL HIGH (ref 0.00–1.49)
Prothrombin Time: 18.9 seconds — ABNORMAL HIGH (ref 11.6–15.2)

## 2014-06-30 LAB — CBC
HEMATOCRIT: 26.4 % — AB (ref 39.0–52.0)
Hemoglobin: 8.6 g/dL — ABNORMAL LOW (ref 13.0–17.0)
MCH: 31.9 pg (ref 26.0–34.0)
MCHC: 32.6 g/dL (ref 30.0–36.0)
MCV: 97.8 fL (ref 78.0–100.0)
Platelets: 359 10*3/uL (ref 150–400)
RBC: 2.7 MIL/uL — ABNORMAL LOW (ref 4.22–5.81)
RDW: 16.4 % — ABNORMAL HIGH (ref 11.5–15.5)
WBC: 9.1 10*3/uL (ref 4.0–10.5)

## 2014-06-30 LAB — BASIC METABOLIC PANEL
ANION GAP: 6 (ref 5–15)
BUN: 36 mg/dL — AB (ref 6–23)
CALCIUM: 8.9 mg/dL (ref 8.4–10.5)
CHLORIDE: 99 mmol/L (ref 96–112)
CO2: 31 mmol/L (ref 19–32)
CREATININE: 1.56 mg/dL — AB (ref 0.50–1.35)
GFR calc Af Amer: 49 mL/min — ABNORMAL LOW (ref >90)
GFR, EST NON AFRICAN AMERICAN: 43 mL/min — AB (ref >90)
Glucose, Bld: 130 mg/dL — ABNORMAL HIGH (ref 70–99)
Potassium: 4.6 mmol/L (ref 3.5–5.1)
Sodium: 136 mmol/L (ref 135–145)

## 2014-06-30 MED ORDER — BISMUTH SUBSALICYLATE 262 MG/15ML PO SUSP
30.0000 mL | Freq: Three times a day (TID) | ORAL | Status: DC | PRN
  Filled 2014-06-30: qty 236

## 2014-06-30 MED ORDER — VANCOMYCIN 50 MG/ML ORAL SOLUTION
125.0000 mg | Freq: Four times a day (QID) | ORAL | Status: DC
  Filled 2014-06-30 (×5): qty 2.5

## 2014-06-30 MED ORDER — SACCHAROMYCES BOULARDII 250 MG PO CAPS
250.0000 mg | ORAL_CAPSULE | Freq: Two times a day (BID) | ORAL | Status: DC
Start: 2014-06-30 — End: 2014-07-05
  Administered 2014-06-30 – 2014-07-05 (×9): 250 mg via ORAL
  Filled 2014-06-30 (×13): qty 1

## 2014-06-30 MED ORDER — METRONIDAZOLE IN NACL 5-0.79 MG/ML-% IV SOLN
500.0000 mg | Freq: Four times a day (QID) | INTRAVENOUS | Status: DC
  Administered 2014-06-30 – 2014-07-02 (×9): 500 mg via INTRAVENOUS
  Filled 2014-06-30 (×13): qty 100

## 2014-06-30 NOTE — Evaluation (Addendum)
Occupational Therapy Evaluation Patient Details Name: [name] MRN: [id number] DOB: 1940/08/14 Today's Date: 06/30/2014    History of Present Illness Pt adm with symptomatic anemia due to abdominal bleeding after hernia repair on 1/21. Pt s/p NG tube placement. PMH - CHF, VT, CAD   Clinical Impression   Pt s/p above. Education provided in session and feel pt is safe to d/c home from OT standpoint (once medically ready), with wife available to assist.    Follow Up Recommendations  No OT follow up;Supervision/Assistance - 24 hour    Equipment Recommendations  None recommended by OT    Recommendations for Other Services       Precautions / Restrictions Precautions Precautions: Fall Restrictions Weight Bearing Restrictions: No      Mobility Bed Mobility Overal bed mobility: Needs Assistance Bed Mobility: Supine to Sit     Supine to sit: Supervision     General bed mobility comments: for lines  Transfers Overall transfer level: Needs assistance Equipment used: Rolling walker (2 wheeled);None Transfers: Sit to/from Stand Sit to Stand: Supervision              Balance Overall balance assessment:  (slightly unsteady with ambulation-pushing IV pole)                                          ADL Overall ADL's : Needs assistance/impaired                     Lower Body Dressing: Supervision/safety;Set up;Sit to/from stand   Toilet Transfer: Min guard;Ambulation;Comfort height toilet;Grab bars   Toileting- Clothing Manipulation and Hygiene: Supervision/safety (standing)       Functional mobility during ADLs: Min guard (using IV pole to push) General ADL Comments: Educated on safety such as safe shoewear, rugs/items on floor, pet, use of bag on walker, sitting for most of LB ADLs. Recommended spouse be with him for shower transfer and OT discussed what to hold onto with shower transfer.  Recommended using walker for now and  discussed how to perform transfers using walker.      Vision                     Perception     Praxis      Pertinent Vitals/Pain Pain Assessment: No/denies pain     Hand Dominance     Extremity/Trunk Assessment Upper Extremity Assessment Upper Extremity Assessment: Overall WFL for tasks assessed   Lower Extremity Assessment Lower Extremity Assessment: Defer to PT evaluation       Communication Communication Communication: No difficulties   Cognition Arousal/Alertness: Awake/alert Behavior During Therapy: WFL for tasks assessed/performed Overall Cognitive Status: Within Functional Limits for tasks assessed                     General Comments       Exercises       Shoulder Instructions      Home Living Family/patient expects to be discharged to:: Private residence Living Arrangements: Spouse/significant other Available Help at Discharge: Family;Available 24 hours/day Type of Home: House Home Access: Stairs to enter [employer] of Steps: several   Home Layout: Two level;Able to live on main level with bedroom/bathroom     Bathroom Shower/Tub: Tub/shower unit;Walk-in shower (uses walk in shower) Shower/tub characteristics: Door Bathroom Toilet: Handicapped height     Home  Equipment: Gilford Rile - 2 wheels;Shower seat - built in          Prior Functioning/Environment Level of Independence: Independent        Comments: prior to surgery. After dc home from surgery pt independent with mobility and going up/down stairs without difficulty until becoming ill.    OT Diagnosis: Generalized weakness;Acute pain   OT Problem List:     OT Treatment/Interventions:      OT Goals(Current goals can be found in the care plan section)    OT Frequency:     Barriers to D/C:            Co-evaluation              End of Session Equipment Utilized During Treatment: Gait belt Nurse Communication: Other (comment) (asked to  disconnect NG tube)  Activity Tolerance: Patient tolerated treatment well Patient left: in chair;with call bell/phone within reach;with family/visitor present   Time: 1739-1756 OT Time Calculation (min): 17 min Charges:  OT General Charges $OT Visit: 1 Procedure OT Evaluation $Initial OT Evaluation Tier I: 1 Procedure G-CodesBenito Mccreedy OTR/L [id number] 06/30/2014, 6:10 PM

## 2014-06-30 NOTE — Progress Notes (Signed)
Patient with +Cdiff Will start PO/IV ABx

## 2014-06-30 NOTE — Progress Notes (Signed)
[facility]  [address] [place], [place] [address] Phone: [phone] FAX: [place] [phone] 31-Jul-1940  CARE TEAM:  PCP: Sheela Stack, MD  Outpatient Care Team: Patient Care Team: Sheela Stack, MD as PCP - General (Endocrinology) Darlin Coco, MD as Consulting Physician (Cardiology) Deboraha Sprang, MD as Consulting Physician (Cardiology) Serafina Mitchell, MD as Consulting Physician (Vascular Surgery) Michael Boston, MD as Consulting Physician (General Surgery)  Inpatient Treatment Team: Treatment Team: Attending Provider: Michael Boston, MD; Consulting Physician: Michae Kava Lbcardiology, MD; Technician: Francene Finders, NT; Registered Nurse: Suzan Nailer, RN; Technician: Army Chaco, NT; Registered Nurse: Wendall Mola, RN; Technician: Michelle Nasuti, NT; Registered Nurse: Claiborne Billings, RN   Subjective:  NG placed yesterday for ileus.  Feels much better today.  Diarrhea has stopped.  Denies abd pain.  No flatus or bowel function noted.    Objective:  Vital signs:  Filed Vitals:   06/29/14 0530 06/29/14 1415 06/29/14 2058 06/30/14 0550  BP: 122/63 118/61 109/58 112/62  Pulse: 74 74 80 79  Temp: 98.6 F (37 C) 98.1 F (36.7 C) 100.4 F (38 C) 98.9 F (37.2 C)  TempSrc: Oral Oral Oral   Resp: '20 18 19 18  ' Height:      Weight:      SpO2: 100% 100% 91% 94%    Last BM Date: 06/29/14  Intake/Output   Yesterday:  01/30 0701 - 01/31 0700 In: -  Out: 2351 [Urine:350; Emesis/NG output:2001] This shift:  Total I/O In: -  Out: 100 [Urine:100]  Bowel function:  Flatus: n  BM: n  Drain: ng  Physical Exam:  General: NAD, looks much better Abd: distended but soft, ecchymosis noted throughout lower abd and pubic region, no bowel sounds   Problem List:   Active Problems:   Symptomatic anemia   Shortness of breath   Assessment  [name]  74 y.o.  male        Plan:  -Ileus: it is unlikely that he is absorbing much right now.  Will hold off on adding any new PO meds.   -Pt with positive C diff yesterday.  Diarrhea has stopped.  WBC back to normal.  Will give IV Flagyl and hold off on any PO meds until bowel function returns.  -Hgb stable.  Min MIV fluids due to cardiac issues.  Cr trending down. -follow Hgb closely - transfuse if worsens.  Doubt active bleed -VTE prophylaxis- SCDs, etc -mobilize as tolerated to help recovery.    Rosario Adie, MD  Colorectal and General Surgery [facility]  [place], [place] [address] [phone] Main / Paging   06/30/2014   Results:   Labs: Results for orders placed or performed during the hospital encounter of 06/27/14 (from the past 48 hour(s))  Hemoglobin     Status: Abnormal   Collection Time: 06/28/14  3:03 PM  Result Value Ref Range   Hemoglobin 8.3 (L) 13.0 - 17.0 g/dL  Protime-INR     Status: Abnormal   Collection Time: 06/29/14  4:30 AM  Result Value Ref Range   Prothrombin Time 19.6 (H) 11.6 - 15.2 seconds   INR 1.65 (H) 0.00 - 1.49  Hemoglobin     Status: Abnormal   Collection Time: 06/29/14  4:30 AM  Result Value Ref Range   Hemoglobin 9.8 (L) 13.0 - 17.0 g/dL  Basic metabolic panel     Status:  Abnormal   Collection Time: 06/29/14  4:30 AM  Result Value Ref Range   Sodium 131 (L) 135 - 145 mmol/L   Potassium 4.9 3.5 - 5.1 mmol/L   Chloride 93 (L) 96 - 112 mmol/L   CO2 31 19 - 32 mmol/L   Glucose, Bld 150 (H) 70 - 99 mg/dL   BUN 48 (H) 6 - 23 mg/dL   Creatinine, Ser 2.21 (H) 0.50 - 1.35 mg/dL    Comment: DELTA CHECK NOTED   Calcium 9.6 8.4 - 10.5 mg/dL   GFR calc non Af Amer 28 (L) >90 mL/min   GFR calc Af Amer 32 (L) >90 mL/min    Comment: (NOTE) The eGFR has been calculated using the CKD EPI equation. This calculation has not been validated in all clinical situations. eGFR's persistently <90 mL/min signify possible Chronic  Kidney Disease.    Anion gap 7 5 - 15  CBC     Status: Abnormal   Collection Time: 06/29/14 10:38 AM  Result Value Ref Range   WBC 14.5 (H) 4.0 - 10.5 K/uL    Comment: WHITE COUNT CONFIRMED ON SMEAR   RBC 3.00 (L) 4.22 - 5.81 MIL/uL   Hemoglobin 9.8 (L) 13.0 - 17.0 g/dL   HCT 29.2 (L) 39.0 - 52.0 %   MCV 97.3 78.0 - 100.0 fL   MCH 32.7 26.0 - 34.0 pg   MCHC 33.6 30.0 - 36.0 g/dL   RDW 15.8 (H) 11.5 - 15.5 %   Platelets 364 150 - 400 K/uL  Brain natriuretic peptide     Status: Abnormal   Collection Time: 06/29/14 10:38 AM  Result Value Ref Range   B Natriuretic Peptide 1486.2 (H) 0.0 - 100.0 pg/mL  Clostridium Difficile by PCR     Status: Abnormal   Collection Time: 06/29/14  7:16 PM  Result Value Ref Range   C difficile by pcr POSITIVE (A) NEGATIVE    Comment: CRITICAL RESULT CALLED TO, READ BACK BY AND VERIFIED WITHChauncey Cruel [facility] RN [id number] 06/29/14 A BROWNING   Protime-INR     Status: Abnormal   Collection Time: 06/30/14  5:44 AM  Result Value Ref Range   Prothrombin Time 18.9 (H) 11.6 - 15.2 seconds   INR 1.57 (H) 0.00 - 7.85  Basic metabolic panel     Status: Abnormal   Collection Time: 06/30/14  5:44 AM  Result Value Ref Range   Sodium 136 135 - 145 mmol/L   Potassium 4.6 3.5 - 5.1 mmol/L   Chloride 99 96 - 112 mmol/L   CO2 31 19 - 32 mmol/L   Glucose, Bld 130 (H) 70 - 99 mg/dL   BUN 36 (H) 6 - 23 mg/dL   Creatinine, Ser 1.56 (H) 0.50 - 1.35 mg/dL   Calcium 8.9 8.4 - 10.5 mg/dL   GFR calc non Af Amer 43 (L) >90 mL/min   GFR calc Af Amer 49 (L) >90 mL/min    Comment: (NOTE) The eGFR has been calculated using the CKD EPI equation. This calculation has not been validated in all clinical situations. eGFR's persistently <90 mL/min signify possible Chronic Kidney Disease.    Anion gap 6 5 - 15  CBC     Status: Abnormal   Collection Time: 06/30/14  5:44 AM  Result Value Ref Range   WBC 9.1 4.0 - 10.5 K/uL   RBC 2.70 (L) 4.22 - 5.81 MIL/uL   Hemoglobin 8.6 (L) 13.0 -  17.0 g/dL   HCT 26.4 (L) 39.0 -  52.0 %   MCV 97.8 78.0 - 100.0 fL   MCH 31.9 26.0 - 34.0 pg   MCHC 32.6 30.0 - 36.0 g/dL   RDW 16.4 (H) 11.5 - 15.5 %   Platelets 359 150 - 400 K/uL    Imaging / Studies: Dg Chest Port 1 View  06/29/2014   CLINICAL DATA:  Shortness of breath, vomiting  EXAM: PORTABLE CHEST - 1 VIEW  COMPARISON:  the previous day's study  FINDINGS: Stable left subclavian AICD with an additional right subclavian lead. Low lung volumes. No focal infiltrate or overt edema. Heart size upper limits normal. No pneumothorax. No effusion. Visualized skeletal structures are unremarkable.  IMPRESSION: No acute cardiopulmonary disease.   Electronically Signed   By: Arne Cleveland M.D.   On: 06/29/2014 09:49   Dg Abd Portable 2v  06/29/2014   CLINICAL DATA:  Acute onset of vomiting. Assess for ileus. Initial encounter.  EXAM: PORTABLE ABDOMEN - 2 VIEW  COMPARISON:  CT of the abdomen and pelvis performed 03/13/2013, and abdominal radiograph performed 11/24/2007  FINDINGS: There is mild distention of the stomach with air. Small bowel loops are dilated to 4.0 cm in maximal diameter, though air is still seen within the transverse colon. The appearance is suggestive of partial small bowel obstruction. There is no definite evidence for diffuse ileus, as the colon remains normal in caliber. No free intra-abdominal air is identified on the provided decubitus view.  No acute osseous abnormalities are seen. An aortoiliac stent graft is noted.  IMPRESSION: Mild distention of the stomach with air. Small bowel loops are dilated to 4.0 cm in maximal diameter, though air is still seen within the transverse colon. The appearance is suggestive of partial small bowel obstruction. No free intra-abdominal air seen.   Electronically Signed   By: Garald Balding M.D.   On: 06/29/2014 09:26    Medications / Allergies: per chart  Antibiotics: Anti-infectives    Start     Dose/Rate Route Frequency Ordered Stop    06/30/14 0800  metroNIDAZOLE (FLAGYL) IVPB 500 mg     500 mg100 mL/hr over 60 Minutes Intravenous Every 6 hours 06/30/14 0706     06/30/14 0715  vancomycin (VANCOCIN) 50 mg/mL oral solution 125 mg  Status:  Discontinued     125 mg Oral 4 times per day 06/30/14 0706 06/30/14 0934       Note: Portions of this report may have been transcribed using voice recognition software. Every effort was made to ensure accuracy; however, inadvertent computerized transcription errors may be present.   Any transcriptional errors that result from this process are unintentional.

## 2014-06-30 NOTE — Progress Notes (Signed)
Patient ID: [name], male   DOB: 11/21/1940, 74 y.o.   MRN: [id number]     Subjective:   More comfortable today with NG in place. Not taking PO.    Objective:   Temp:  [98.1 F (36.7 C)-100.4 F (38 C)] 98.9 F (37.2 C) (01/31 0550) Pulse Rate:  [74-80] 79 (01/31 0550) Resp:  [18-19] 18 (01/31 0550) BP: (109-118)/(58-62) 112/62 mmHg (01/31 0550) SpO2:  [91 %-100 %] 94 % (01/31 0550) Last BM Date: 06/29/14  Filed Weights   06/27/14 0942  Weight: 177 lb 12.8 oz (80.65 kg)    Intake/Output Summary (Last 24 hours) at 06/30/14 1044 Last data filed at 06/30/14 0800  Gross per 24 hour  Intake      0 ml  Output   2450 ml  Net  -2450 ml    Telemetry:SR and AV pacing.   Exam:  General: NAD  Resp: faint crackles bilateral bases  Cardiac: RRR, no m/r/g, no JVD  GI: abdomen NT  MSK: no LE edema  Neuro: no focal deficits   Lab Results:  Basic Metabolic Panel:  Recent Labs Lab 06/28/14 0335 06/29/14 0430 06/30/14 0544  NA 129* 131* 136  K 4.9 4.9 4.6  CL 94* 93* 99  CO2 28 31 31   GLUCOSE 114* 150* 130*  BUN 64* 48* 36*  CREATININE 3.50* 2.21* 1.56*  CALCIUM 8.9 9.6 8.9    Liver Function Tests: No results for input(s): AST, ALT, ALKPHOS, BILITOT, PROT, ALBUMIN in the last 168 hours.  CBC:  Recent Labs Lab 06/28/14 0335  06/29/14 0430 06/29/14 1038 06/30/14 0544  WBC 16.5*  --   --  14.5* 9.1  HGB 8.1*  < > 9.8* 9.8* 8.6*  HCT 23.2*  --   --  29.2* 26.4*  MCV 91.7  --   --  97.3 97.8  PLT 265  --   --  364 359  < > = values in this interval not displayed.  Cardiac Enzymes: No results for input(s): CKTOTAL, CKMB, CKMBINDEX, TROPONINI in the last 168 hours.  BNP:  Recent Labs  12/05/13 0047 01/17/14 2153  PROBNP 2438.0* 3228.0*    Coagulation:  Recent Labs Lab 06/28/14 0335 06/29/14 0430 06/30/14 0544  INR 2.57* 1.65* 1.57*    ECG:   Medications:   Scheduled Medications: . acetaminophen  1,000 mg Oral TID  .  ALPRAZolam  0.125 mg Oral 4 times per day  . carvedilol  3.125 mg Oral BID WC  . famotidine  20 mg Oral Daily  . levothyroxine  132 mcg Oral Once per day on Fri  . levothyroxine  88 mcg Oral Once per day on Sun Mon Tue Wed Thu Sat  . lip balm   Topical BID  . loratadine  10 mg Oral Daily  . metronidazole  500 mg Intravenous Q6H  . mexiletine  150 mg Oral 3 times per day  . rosuvastatin  5 mg Oral Once per day on Tue Sat  . saccharomyces boulardii  250 mg Oral BID  . sodium chloride  3 mL Intravenous Q12H     Infusions: . dextrose 5 % and 0.45 % NaCl with KCl 20 mEq/L 50 mL (06/30/14 0843)     PRN Medications:  sodium chloride, alum & mag hydroxide-simeth, bismuth subsalicylate, diphenhydrAMINE **OR** diphenhydrAMINE, fentaNYL, magic mouthwash, meclizine, menthol-cetylpyridinium, ondansetron, phenol, promethazine, sodium chloride, traMADol     Assessment/Plan   1. Anemia - s/p transfusion, management per primary team  2. Chronic  systolic HF/Ischemic CM - echo 11/2013 LVEF 20-25%, grade II diastolic dysfunction.  - coreg, lasix, losartan, on hold due to ileus and NG tube placement to suction - CXR clear yesterday, exam without evidence of fluid overload. Labs continue to suggest he is hypovolemic. Continue to hold diuretic. He is NPO with ileus on IVF at 4  3. 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 Hgb drop requiring transfusion. Anticoag on hold.   4. Hx of VT - hx of 3 prior ablations, most recentl in 07/2011 by Dr Rayann Heman. Has been on mexilitene, sotalol, and also had ICD - amio caused issues with thyroid function in the past.  - off mexilitene and sotalol in setting of AKI and ileus. No arrhythmias on tele, discussed with EP yesterday and will discuss again today. Continuing to monitor for now.    5. Ileus - NG in place  6. C.diff colitis - abx per primary team  7. AKI - Cr trending down, follow prior to resuming sotalol   Carlyle Dolly, M.D.

## 2014-07-01 ENCOUNTER — Inpatient Hospital Stay (HOSPITAL_COMMUNITY): Payer: Medicare Other

## 2014-07-01 ENCOUNTER — Other Ambulatory Visit: Payer: Self-pay

## 2014-07-01 ENCOUNTER — Encounter (HOSPITAL_COMMUNITY): Payer: Self-pay | Admitting: Surgery

## 2014-07-01 DIAGNOSIS — K4020 Bilateral inguinal hernia, without obstruction or gangrene, not specified as recurrent: Secondary | ICD-10-CM | POA: Diagnosis present

## 2014-07-01 DIAGNOSIS — Z95810 Presence of automatic (implantable) cardiac defibrillator: Secondary | ICD-10-CM

## 2014-07-01 DIAGNOSIS — S301XXA Contusion of abdominal wall, initial encounter: Secondary | ICD-10-CM | POA: Diagnosis present

## 2014-07-01 DIAGNOSIS — I255 Ischemic cardiomyopathy: Secondary | ICD-10-CM

## 2014-07-01 DIAGNOSIS — A0472 Enterocolitis due to Clostridium difficile, not specified as recurrent: Secondary | ICD-10-CM | POA: Diagnosis not present

## 2014-07-01 LAB — PROTIME-INR
INR: 1.56 — ABNORMAL HIGH (ref 0.00–1.49)
Prothrombin Time: 18.8 s — ABNORMAL HIGH (ref 11.6–15.2)

## 2014-07-01 LAB — BASIC METABOLIC PANEL
Anion gap: 8 (ref 5–15)
BUN: 32 mg/dL — ABNORMAL HIGH (ref 6–23)
CO2: 33 mmol/L — ABNORMAL HIGH (ref 19–32)
Calcium: 9.3 mg/dL (ref 8.4–10.5)
Chloride: 100 mmol/L (ref 96–112)
Creatinine, Ser: 1.51 mg/dL — ABNORMAL HIGH (ref 0.50–1.35)
GFR calc Af Amer: 51 mL/min — ABNORMAL LOW (ref >90)
GFR, EST NON AFRICAN AMERICAN: 44 mL/min — AB (ref >90)
GLUCOSE: 131 mg/dL — AB (ref 70–99)
Potassium: 4.3 mmol/L (ref 3.5–5.1)
Sodium: 141 mmol/L (ref 135–145)

## 2014-07-01 LAB — HEMOGLOBIN: HEMOGLOBIN: 9.7 g/dL — AB (ref 13.0–17.0)

## 2014-07-01 MED ORDER — PROMETHAZINE HCL 25 MG/ML IJ SOLN
6.2500 mg | Freq: Four times a day (QID) | INTRAMUSCULAR | Status: DC | PRN
  Administered 2014-07-02: 6.258 mg via INTRAVENOUS
  Filled 2014-07-01: qty 1

## 2014-07-01 MED ORDER — LORAZEPAM 2 MG/ML IJ SOLN
0.5000 mg | Freq: Three times a day (TID) | INTRAMUSCULAR | Status: DC | PRN
  Administered 2014-07-03: 1 mg via INTRAVENOUS
  Filled 2014-07-01: qty 1

## 2014-07-01 MED ORDER — DIPHENHYDRAMINE HCL 50 MG/ML IJ SOLN: 12.5000 mg | Freq: Four times a day (QID) | INTRAMUSCULAR | Status: DC | PRN

## 2014-07-01 MED ORDER — VANCOMYCIN 50 MG/ML ORAL SOLUTION
125.0000 mg | Freq: Four times a day (QID) | ORAL | Status: DC
  Administered 2014-07-01 – 2014-07-05 (×13): 125 mg via ORAL
  Filled 2014-07-01 (×22): qty 2.5

## 2014-07-01 MED ORDER — ENOXAPARIN SODIUM 40 MG/0.4ML ~~LOC~~ SOLN
40.0000 mg | Freq: Two times a day (BID) | SUBCUTANEOUS | Status: DC
  Administered 2014-07-01 (×2): 40 mg via SUBCUTANEOUS
  Filled 2014-07-01 (×4): qty 0.4

## 2014-07-01 MED ORDER — ALPRAZOLAM 0.25 MG PO TABS
0.1250 mg | ORAL_TABLET | ORAL | Status: DC
  Administered 2014-07-01 – 2014-07-05 (×11): 0.125 mg via ORAL
  Filled 2014-07-01 (×12): qty 1

## 2014-07-01 NOTE — Progress Notes (Signed)
Patient: [name] / Admit Date: 06/27/2014 / Date of Encounter: 07/01/2014, 12:02 PM   Subjective: Feeling much better. 2 stools so far, nonbloody, formed. No CP or SOB.  Objective: Telemetry: NSR/ST, occasional V pacing, occasional AV pacing, and brief runs of tachycardic v-pacing Physical Exam: Blood pressure 129/61, pulse 83, temperature 98.7 F (37.1 C), temperature source Oral, resp. rate 15, height 5\' 8"  (1.727 m), weight 177 lb 12.8 oz (80.65 kg), SpO2 98 %. General: Well developed, well nourished WM, in no acute distress. NGT in place. Head: Normocephalic, atraumatic, sclera non-icteric, no xanthomas, nares are without discharge. Neck: Negative for carotid bruits. JVP not elevated. Lungs: Clear bilaterally to auscultation without wheezes, rales, or rhonchi. Breathing is unlabored. Heart: RRR S1 S2 without murmurs, rubs, or gallops.  Abdomen: Soft, non-tender, non-distended with normoactive bowel sounds. No rebound/guarding. Extremities: No clubbing or cyanosis. No edema. Distal pedal pulses are 2+ and equal bilaterally. Neuro: Alert and oriented X 3. Moves all extremities spontaneously. Psych:  Responds to questions appropriately with a normal affect.   Intake/Output Summary (Last 24 hours) at 07/01/14 1202 Last data filed at 07/01/14 1101  Gross per 24 hour  Intake    995 ml  Output   4004 ml  Net  -3009 ml    Inpatient Medications:  . acetaminophen  1,000 mg Oral TID  . ALPRAZolam  0.125 mg Oral 4 times per day  . carvedilol  3.125 mg Oral BID WC  . enoxaparin (LOVENOX) injection  40 mg Subcutaneous Q12H  . famotidine  20 mg Oral Daily  . levothyroxine  132 mcg Oral Once per day on Fri  . levothyroxine  88 mcg Oral Once per day on Sun Mon Tue Wed Thu Sat  . lip balm   Topical BID  . loratadine  10 mg Oral Daily  . metronidazole  500 mg Intravenous Q6H  . mexiletine  150 mg Oral 3 times per day  . rosuvastatin  5 mg Oral Once per day on Tue Sat  .  saccharomyces boulardii  250 mg Oral BID  . sodium chloride  3 mL Intravenous Q12H  . vancomycin  125 mg Oral QID   Infusions:  . dextrose 5 % and 0.45 % NaCl with KCl 20 mEq/L 50 mL/hr at 07/01/14 0758    Labs:  Recent Labs  06/30/14 0544 07/01/14 0531  NA 136 141  K 4.6 4.3  CL 99 100  CO2 31 33*  GLUCOSE 130* 131*  BUN 36* 32*  CREATININE 1.56* 1.51*  CALCIUM 8.9 9.3   No results for input(s): AST, ALT, ALKPHOS, BILITOT, PROT, ALBUMIN in the last 72 hours.  Recent Labs  06/29/14 1038 06/30/14 0544 07/01/14 0531  WBC 14.5* 9.1  --   HGB 9.8* 8.6* 9.7*  HCT 29.2* 26.4*  --   MCV 97.3 97.8  --   PLT 364 359  --    No results for input(s): CKTOTAL, CKMB, TROPONINI in the last 72 hours. Invalid input(s): POCBNP No results for input(s): HGBA1C in the last 72 hours.   Radiology/Studies:  Dg Chest 2 View  06/28/2014   CLINICAL DATA:  Coronary artery disease.  EXAM: CHEST  2 VIEW  COMPARISON:  06/15/2014.  FINDINGS: Mediastinum and hilar structures are normal. Cardiac pacer in stable position. Stable cardiomegaly. No pulmonary venous congestion or interstitial edema noted on today's exam. Bibasilar subsegmental atelectasis. No pleural effusion or pneumothorax. Mild bowel distention noted in the upper abdomen. Abdominal series suggested to  further evaluate. These changes may sec be secondary to adynamic ileus. Abdominal aortic stent graft noted.  IMPRESSION: 1. Interval clearing of congestive heart failure and interstitial edema. Mild left basilar segment atelectasis .  2. Mild bowel distention in the upper abdomen. These changes may be secondary to adynamic ileus. Abdominal series suggested to further evaluate. Abdominal aortic stent graft noted.   Electronically Signed   By: Marcello Moores  Register   On: 06/28/2014 07:59   Dg Chest 2 View  06/15/2014   CLINICAL DATA:  Initial evaluation for shortness of breath.  Acute.  EXAM: CHEST  2 VIEW  COMPARISON:  Prior study from 01/20/2014   FINDINGS: Left-sided transvenous pacemaker/AICD is stable. Cardiomegaly unchanged. Mediastinal silhouette within normal limits. Tortuosity of the intrathoracic aorta noted.  Lungs are normally inflated. There is increased pulmonary vascular congestion with indistinctness of the interstitial markings, compatible with diffuse interstitial edema. No definite focal infiltrates. No pleural effusion. No pneumothorax.  No acute osseus abnormality.  IMPRESSION: Stable cardiomegaly with findings most consistent with mild to moderate diffuse pulmonary interstitial edema.   Electronically Signed   By: Jeannine Boga M.D.   On: 06/15/2014 02:21   Dg Chest Port 1 View  06/29/2014   CLINICAL DATA:  Shortness of breath, vomiting  EXAM: PORTABLE CHEST - 1 VIEW  COMPARISON:  the previous day's study  FINDINGS: Stable left subclavian AICD with an additional right subclavian lead. Low lung volumes. No focal infiltrate or overt edema. Heart size upper limits normal. No pneumothorax. No effusion. Visualized skeletal structures are unremarkable.  IMPRESSION: No acute cardiopulmonary disease.   Electronically Signed   By: Arne Cleveland M.D.   On: 06/29/2014 09:49   Dg Abd 2 Views  07/01/2014   CLINICAL DATA:  Ileus, assess NG tube positioning  EXAM: ABDOMEN - 2 VIEW  COMPARISON:  Supine and left-side-down decubitus films of the abdomen dated June 29, 2014  FINDINGS: The bowel gas pattern is normal. The previously demonstrated is dilated is gas-filled small bowel loops are not evident today. The nasogastric tube tip lies in the region of the proximal duodenum. There are calcifications which project over the lower poles of both kidneys which may reflect stones measuring up to 3 mm in diameter. There are numerous phleboliths in the pelvis. There is an aorto bi-iliac stent graft in place. The bony structures are unremarkable.  IMPRESSION: Interval resolution of the ileus type pattern since placement of the nasogastric tube.  The bowel gas pattern today is normal. Probable bilateral kidney stones.   Electronically Signed   By: [name]  [place]   On: 07/01/2014 07:49   Dg Abd Portable 2v  06/29/2014   CLINICAL DATA:  Acute onset of vomiting. Assess for ileus. Initial encounter.  EXAM: PORTABLE ABDOMEN - 2 VIEW  COMPARISON:  CT of the abdomen and pelvis performed 03/13/2013, and abdominal radiograph performed 11/24/2007  FINDINGS: There is mild distention of the stomach with air. Small bowel loops are dilated to 4.0 cm in maximal diameter, though air is still seen within the transverse colon. The appearance is suggestive of partial small bowel obstruction. There is no definite evidence for diffuse ileus, as the colon remains normal in caliber. No free intra-abdominal air is identified on the provided decubitus view.  No acute osseous abnormalities are seen. An aortoiliac stent graft is noted.  IMPRESSION: Mild distention of the stomach with air. Small bowel loops are dilated to 4.0 cm in maximal diameter, though air is still seen within the transverse  colon. The appearance is suggestive of partial small bowel obstruction. No free intra-abdominal air seen.   Electronically Signed   By: Garald Balding M.D.   On: 06/29/2014 09:26     Assessment and Plan  74 yo with hx of CAD, chronic systolic CHF, paroxysmal VT s/p 3 prior ablations, paroxsymal atrial fib who was admitted with evidence of abdominal bleeding follow laproscopic hernia repair, ABL anemia, and course complicated by ileus requiring NGT and C. Diff colitis. We were consulted to follow along to watch for any signs of CHF.   1. Acute blood loss anemia to recent surgical site complicated by ileus, C diff colitis, AKI 2. Acute kidney injury with hyperkalemia 3. H/o paroxysmal VT treated with mexilitine/sotalol, multiple ablations - with wide complex paced tachycardia on telemetry 4. Chronic systolic CHF/ICM s/p [employer] ICD 5. CAD (MI/PTCA 1999, stent - RCA 2005, patent  stents in 2010) 6. H/o apical aneurysm 7. 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 AAA repair in 2009 9. HLD 10. PAF identified on prior remote ICD monitoring  Meds were on hold due to continued NPO status but he has advanced to clears today and is now getting oral meds. Surgery recommended low dose lovenox today then back to oral warfarin tomorrow. Cr improved to 1.51. Coreg restarted. Would not resume ARB just yet with recent renal impairment. Hgb improved from admission, last check conflicting at 5.6-2.5. INR 1.56. VSS remain stable along with volume status - hold off resuming diuretic for now.   However, tachyarrhythmia noted on telemetry in runs, unclear what this represents, ?VT versus pacemaker-mediated tachycardia. Will ask EP to see given complex history and home regimen.  Signed, Melina Copa PA-C  I have examined the patient and reviewed assessment and plan and discussed with patient.  Agree with above as stated.  Patient has not had syncope but could feel some palpitations.  They started after he got up and walked and took his medicines.  [name] Irani S.

## 2014-07-01 NOTE — Progress Notes (Addendum)
[place]  [address] [place], [place] [address] Phone: [phone] FAX: [place] [phone] 12-29-1940  CARE TEAM:  PCP: Sheela Stack, MD  Outpatient Care Team: Patient Care Team: Sheela Stack, MD as PCP - General (Endocrinology) Darlin Coco, MD as Consulting Physician (Cardiology) Deboraha Sprang, MD as Consulting Physician (Cardiology) Serafina Mitchell, MD as Consulting Physician (Vascular Surgery) Michael Boston, MD as Consulting Physician (General Surgery)  Inpatient Treatment Team: Treatment Team: Attending Provider: Michael Boston, MD; Consulting Physician: Michae Kava Lbcardiology, MD; Technician: Francene Finders, NT; Registered Nurse: Suzan Nailer, RN; Registered Nurse: Wendall Mola, RN; Technician: Michelle Nasuti, NT; Technician: Army Chaco, NT; Registered Nurse: Micki Riley, RN   Subjective:  No major events Problems with insomnia - uses Xanax QID Feeling better - pt & wife hopeful Wife in room RN in room  Objective:  Vital signs:  Filed Vitals:   06/30/14 0550 06/30/14 1523 06/30/14 2247 07/01/14 0638  BP: 112/62 126/60 124/58 129/61  Pulse: 79 95 86 83  Temp: 98.9 F (37.2 C) 98.4 F (36.9 C) 98.3 F (36.8 C) 98.7 F (37.1 C)  TempSrc:  Oral Oral   Resp: '18 17 16 15  ' Height:      Weight:      SpO2: 94% 100% 100% 98%    Last BM Date: 06/30/14  Intake/Output   Yesterday:  01/31 0701 - 02/01 0700 In: 5 [I.V.:515] Out: 4102 [Urine:1300; Emesis/NG output:2800; Stool:2] This shift:  Total I/O In: 515 [I.V.:515] Out: 2300 [Urine:600; Emesis/NG output:1700]  Bowel function:  Flatus: y  BM: yes - more formed  Drain: thin bile  Physical Exam:  General: Pt awake/alert/oriented x4 in no acute distress.  Better Eyes: PERRL, normal EOM.  Sclera clear.  No icterus Neuro: CN II-XII intact w/o focal sensory/motor deficits. Lymph: No  head/neck/groin lymphadenopathy Psych:  No delerium/psychosis/paranoia.  More alert.  Not confused HENT: Normocephalic, Mucus membranes moist.  No thrush Neck: Supple, No tracheal deviation Chest: No chest wall pain w good excursion CV:  Pulses intact.  Regular rhythm MS: Normal AROM mjr joints.  No obvious deformity Abdomen: Soft.  Nondistended.  Nontender at incisions.  Large ecchymosis in flank but softer.  No evidence of peritonitis.  No incarcerated hernias. GU:  NEMG.  Ecchymosis but min edema Ext:  SCDs BLE.  No mjr edema.  No cyanosis Skin: No petechiae / purpura   Problem List:   Principal Problem:   Symptomatic anemia Active Problems:   Cardiomyopathy, ischemic   Chronic systolic heart failure   Chronic anticoagulation   Biventricular implantable cardioverter-defibrillator in situ   HTN (hypertension)   Anxiety   Acute renal insufficiency   Clostridium difficile colitis   Bilateral inguinal hernia (BIH) s/p lap repair w mesh 06/20/2014   Hematoma of abdominal wall   Assessment  [name]  74 y.o. male       Improving but many issues  Plan:  -try clamping NGT.  Suspect higher output because tip in duodenum - pull NGT back 15cm - d/w RN.  KUB & exam improved  -try clears.  If tolerates, then pull NGT out in AM & adv diet tomorrow  -bowel regimen once better  -IV flagyl & PO Vancomycin for C diff - still very odd to get Cdiff w only one periop dose of ABx but probable indicator of fragility  -keep dry.  Cr stable/improving even w  ----  I&O  -no issues on telemetry.  Hopefully back on most cardiac meds soon  -restart anticoag while inpatient.  Low dose lovenox today, then full w PO warfarin tomorrow if Hgb stable.  NO SHOTS IN ABDOMINAL WALL!!!! I worry it will encourage bleeding again  -follow Hgb closely - transfuse if worsens.  No issues = doubt active bleed.    -iron for anemia when taking PO.  -Anxiolysis/insomnia.  IV lorazepam now then  change to PO Xanax, depending on bowel fx.  Benadryl for backup - they feel IV works better  -VTE prophylaxis- SCDs, etc  -mobilize as tolerated to help recovery.  Get PT/OT involved  I updated the patient's status to the patient, his wife, and his RN/CNA.  Recommendations were made.  Questions were answered.  They expressed understanding & appreciation.   [name], M.D., F.A.C.S. Gastrointestinal and Minimally Invasive Surgery [facility] [address], [place], [place] [address] [phone] Main / Paging   07/01/2014   Results:   Labs: Results for orders placed or performed during the hospital encounter of 06/27/14 (from the past 48 hour(s))  CBC     Status: Abnormal   Collection Time: 06/29/14 10:38 AM  Result Value Ref Range   WBC 14.5 (H) 4.0 - 10.5 K/uL    Comment: WHITE COUNT CONFIRMED ON SMEAR   RBC 3.00 (L) 4.22 - 5.81 MIL/uL   Hemoglobin 9.8 (L) 13.0 - 17.0 g/dL   HCT 29.2 (L) 39.0 - 52.0 %   MCV 97.3 78.0 - 100.0 fL   MCH 32.7 26.0 - 34.0 pg   MCHC 33.6 30.0 - 36.0 g/dL   RDW 15.8 (H) 11.5 - 15.5 %   Platelets 364 150 - 400 K/uL  Brain natriuretic peptide     Status: Abnormal   Collection Time: 06/29/14 10:38 AM  Result Value Ref Range   B Natriuretic Peptide 1486.2 (H) 0.0 - 100.0 pg/mL  Clostridium Difficile by PCR     Status: Abnormal   Collection Time: 06/29/14  7:16 PM  Result Value Ref Range   C difficile by pcr POSITIVE (A) NEGATIVE    Comment: CRITICAL RESULT CALLED TO, READ BACK BY AND VERIFIED WITHChauncey Cruel [facility] RN 2224 06/29/14 A BROWNING   Protime-INR     Status: Abnormal   Collection Time: 06/30/14  5:44 AM  Result Value Ref Range   Prothrombin Time 18.9 (H) 11.6 - 15.2 seconds   INR 1.57 (H) 0.00 - 9.19  Basic metabolic panel     Status: Abnormal   Collection Time: 06/30/14  5:44 AM  Result Value Ref Range   Sodium 136 135 - 145 mmol/L   Potassium 4.6 3.5 - 5.1 mmol/L   Chloride 99 96 - 112 mmol/L    CO2 31 19 - 32 mmol/L   Glucose, Bld 130 (H) 70 - 99 mg/dL   BUN 36 (H) 6 - 23 mg/dL   Creatinine, Ser 1.56 (H) 0.50 - 1.35 mg/dL   Calcium 8.9 8.4 - 10.5 mg/dL   GFR calc non Af Amer 43 (L) >90 mL/min   GFR calc Af Amer 49 (L) >90 mL/min    Comment: (NOTE) The eGFR has been calculated using the CKD EPI equation. This calculation has not been validated in all clinical situations. eGFR's persistently <90 mL/min signify possible Chronic Kidney Disease.    Anion gap 6 5 - 15  CBC     Status: Abnormal   Collection Time: 06/30/14  5:44 AM  Result Value Ref Range   WBC 9.1 4.0 - 10.5 K/uL   RBC 2.70 (L) 4.22 - 5.81 MIL/uL   Hemoglobin 8.6 (L) 13.0 - 17.0 g/dL   HCT 26.4 (L) 39.0 - 52.0 %   MCV 97.8 78.0 - 100.0 fL   MCH 31.9 26.0 - 34.0 pg   MCHC 32.6 30.0 - 36.0 g/dL   RDW 16.4 (H) 11.5 - 15.5 %   Platelets 359 150 - 400 K/uL  Protime-INR     Status: Abnormal   Collection Time: 07/01/14  5:31 AM  Result Value Ref Range   Prothrombin Time 18.8 (H) 11.6 - 15.2 seconds   INR 1.56 (H) 0.00 - 1.49  Hemoglobin     Status: Abnormal   Collection Time: 07/01/14  5:31 AM  Result Value Ref Range   Hemoglobin 9.7 (L) 13.0 - 17.0 g/dL  Basic metabolic panel     Status: Abnormal   Collection Time: 07/01/14  5:31 AM  Result Value Ref Range   Sodium 141 135 - 145 mmol/L   Potassium 4.3 3.5 - 5.1 mmol/L   Chloride 100 96 - 112 mmol/L   CO2 33 (H) 19 - 32 mmol/L   Glucose, Bld 131 (H) 70 - 99 mg/dL   BUN 32 (H) 6 - 23 mg/dL   Creatinine, Ser 1.51 (H) 0.50 - 1.35 mg/dL   Calcium 9.3 8.4 - 10.5 mg/dL   GFR calc non Af Amer 44 (L) >90 mL/min   GFR calc Af Amer 51 (L) >90 mL/min    Comment: (NOTE) The eGFR has been calculated using the CKD EPI equation. This calculation has not been validated in all clinical situations. eGFR's persistently <90 mL/min signify possible Chronic Kidney Disease.    Anion gap 8 5 - 15    Imaging / Studies: Dg Chest Port 1 View  06/29/2014   CLINICAL  DATA:  Shortness of breath, vomiting  EXAM: PORTABLE CHEST - 1 VIEW  COMPARISON:  the previous day's study  FINDINGS: Stable left subclavian AICD with an additional right subclavian lead. Low lung volumes. No focal infiltrate or overt edema. Heart size upper limits normal. No pneumothorax. No effusion. Visualized skeletal structures are unremarkable.  IMPRESSION: No acute cardiopulmonary disease.   Electronically Signed   By: Arne Cleveland M.D.   On: 06/29/2014 09:49   Dg Abd Portable 2v  06/29/2014   CLINICAL DATA:  Acute onset of vomiting. Assess for ileus. Initial encounter.  EXAM: PORTABLE ABDOMEN - 2 VIEW  COMPARISON:  CT of the abdomen and pelvis performed 03/13/2013, and abdominal radiograph performed 11/24/2007  FINDINGS: There is mild distention of the stomach with air. Small bowel loops are dilated to 4.0 cm in maximal diameter, though air is still seen within the transverse colon. The appearance is suggestive of partial small bowel obstruction. There is no definite evidence for diffuse ileus, as the colon remains normal in caliber. No free intra-abdominal air is identified on the provided decubitus view.  No acute osseous abnormalities are seen. An aortoiliac stent graft is noted.  IMPRESSION: Mild distention of the stomach with air. Small bowel loops are dilated to 4.0 cm in maximal diameter, though air is still seen within the transverse colon. The appearance is suggestive of partial small bowel obstruction. No free intra-abdominal air seen.   Electronically Signed   By: Garald Balding M.D.   On: 06/29/2014 09:26    Medications / Allergies: per chart  Antibiotics: Anti-infectives  Start     Dose/Rate Route Frequency Ordered Stop   06/30/14 0800  metroNIDAZOLE (FLAGYL) IVPB 500 mg     500 mg100 mL/hr over 60 Minutes Intravenous Every 6 hours 06/30/14 0706     06/30/14 0715  vancomycin (VANCOCIN) 50 mg/mL oral solution 125 mg  Status:  Discontinued     125 mg Oral 4 times per day  06/30/14 0706 06/30/14 0934       Note: Portions of this report may have been transcribed using voice recognition software. Every effort was made to ensure accuracy; however, inadvertent computerized transcription errors may be present.   Any transcriptional errors that result from this process are unintentional.

## 2014-07-01 NOTE — Progress Notes (Signed)
Attempted report 

## 2014-07-01 NOTE — Progress Notes (Signed)
RN receiving frequent alerts for patient in "V-Tach".  Patient alert and oriented, resting comfortably with no complaints. States that he felt his heart "race" a couple times, but denies dizziness or chest pain.  Cardiology on floor to assess patient and recommended transfer to cardiac stepdown unit.  Paged Dr. Johney Maine and received call back with verbal orders from Erby Pian, NP to initiate transfer.  12-Lead EKG completed and cardiac medications restarted since NGT is clamped & patient is tolerating clear liquid diet. Will continue to monitor.

## 2014-07-01 NOTE — Consult Note (Signed)
ELECTROPHYSIOLOGY CONSULT NOTE    Patient ID: [name] MRN: [id number], DOB/AGE: 06/17/1940 74 y.o.  Admit date: 06/27/2014 Date of Consult: 07/01/2014  Primary Physician: Sheela Stack, MD Primary Cardiologist: Mare Ferrari Electrophysiologist: Caryl Comes  Reason for Consultation: VT  HPI:  [name] is a 74 y.o. 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   He presented with VT storm in September of 2012 and underwent ablation at that time.  He had recurrent VT post ablation and was transferred to [facility] where he underwent repeat ablation complicated by pericardial perforation and subsequent pericarditis.  He was treated with Sotalol and Mexilitine was also added due to recurrent VT.  He then had recurrent slow VT at 110-130 bpm and underwent repeat ablation in March of 2013.  He has done relatively well since then from an arrhythmia standpoint.  He still has had episodes of VT that have been pace terminated, but no more ICD shocks.   He was admitted 06-27-14 following laproscopic bilateral inguinal hernia repairs with increased fatigue, abdominal girth, and shortness of breath.  His Hgb had fallen to 7.5 and he was admitted for further evaluation.  He subsequently developed an ileus and improved after NG tube placement.  His PO medications have been held over the last 2 days due to ileus but were resumed this morning.  He has developed intermittent VT treated with ATP by his device.  These episodes have been minimally symptomatic.  EP has been asked to evaluate for treatment options.   Last echo 11/2013 demonstrated EF 20-25%, mild LVH, grade 2 diastolic dysfunction, mild AR, mild biatrial enlargement.  Lab work is reviewed.  Creatinine has improved from  >3 on admission to 1.3 now (baseline).   He states that he is feeling much improved.  He does not have chest pain or shortness of breath currently.  Nausea is improved and he was able to keep clears down this morning.  No fevers, chills.  Diarrhea has stopped, formed stools this morning.  ROS is otherwise negative.    Past Medical History  Diagnosis Date  . CAD (coronary artery disease) 1999    Remote anterolater and apical MI, cath 2011 patent stents RCA/LCx  LAD w/o obstruction  . Cardiomyopathy, ischemic     EF 20-25%  echo 2012  . Chronic systolic congestive heart failure   . ICD (implantable cardiac defibrillator) in place     [place] [facility]  . 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 Abdominal aortic aneurysm 2009    Repaired with endovascular stent  . PVD (peripheral vascular disease)     Has occluded innominate vein  . Myocardial infarction   . Hypertension   . Ventricular tachycardia     recurrent slow VT at 100-110 bpm,  Rx mexilitene/sotalol  . Left bundle branch block   . Systolic CHF, chronic   . Arthritis   . Gynecomastia     2/2 spironolactone  . Atrial fibrillation   . Pericarditis     due to perforation  . Inguinal hernia recurrent bilateral   . GERD (gastroesophageal reflux disease)   . Anemia 05/2014  . Cardiac arrest 12/05/2013  . Acute on chronic systolic heart failure 11/06/4852  . 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 SVT (supraventricular  tachycardia) 12/05/2013     Surgical History:  Past Surgical History  Procedure Laterality Date  . Icd  Feb 2012    Change out with LV lead placed with tunneling from the right  to the left side  . Endovascular stent insertion      for AAA  . Cardiac catheterization      x2  . Pacemaker insertion    . Ventricular ablation surgery      s/p prior EPS and ablation at [facility] 9/12, [facility] 11/12, and most recent [facility] 08/23/11  . Tonsillectomy    . V-tach ablation N/A  08/24/2011    Procedure: V-TACH ABLATION;  Surgeon: Thompson Grayer, MD;  Location: [facility] CATH LAB;  Service: Cardiovascular;  Laterality: N/A;  . Inguinal hernia repair Bilateral 06/20/2014    Procedure: LAPAROSCOPIC BILATERAL INGUINAL HERNIA REPAIR;  Surgeon: Michael Boston, MD;  Location: [place];  Service: General;  Laterality: Bilateral;  . Insertion of mesh N/A 06/20/2014    Procedure: INSERTION OF MESH;  Surgeon: Michael Boston, MD;  Location: [place];  Service: General;  Laterality: N/A;     Prescriptions prior to admission  Medication Sig Dispense Refill Last Dose  . acetaminophen (TYLENOL) 500 MG tablet Take 1,000 mg by mouth every 6 (six) hours as needed for moderate pain.   06/26/2014 at Unknown time  . ALPRAZolam (XANAX) 0.25 MG tablet Take 0.5-1 tablets (0.125-0.25 mg total) by mouth 4 (four) times daily. (Patient taking differently: Take 0.125-0.25 mg by mouth See admin instructions. Takes 1/2 tab around 2AM, takes 1 tab at 6AM,  Takes 1/2 tab at 1030AM, takes 1 tab at [facility], takes 1/2 at [facility], and takes 1/2 tab at 9PM) 360 tablet 1 06/27/2014 at Unknown time  . Ascorbic Acid (VITAMIN C) 500 MG tablet Take 500 mg by mouth daily.     06/27/2014 at Unknown time  . carvedilol (COREG) 12.5 MG tablet Take 1 tablet (12.5 mg total) by mouth 2 (two) times daily with a meal. Take one tablet by mouth twice daily 60 tablet 11 06/27/2014 at 0700  . Docusate Calcium (STOOL SOFTENER PO) Take 1 capsule by mouth daily.   06/26/2014 at Unknown time  . furosemide (LASIX) 20 MG tablet Take 20 mg by mouth 2 (two) times daily.   06/27/2014 at Unknown time  . levothyroxine (SYNTHROID, LEVOTHROID) 88 MCG tablet Take 88-132 mcg by mouth daily. Take 1.5 tablets on Friday and 1 tablet the rest of the week.   06/27/2014 at Unknown time  . loratadine (CLARITIN) 10 MG tablet Take 10 mg by mouth daily.   06/26/2014 at Unknown time  . losartan (COZAAR) 50 MG tablet Take 0.5 tablets (25 mg total) by mouth daily. 90 tablet 3 06/27/2014 at  Unknown time  . meclizine (ANTIVERT) 25 MG tablet Take 25 mg by mouth 3 (three) times daily.   06/27/2014 at Unknown time  . mexiletine (MEXITIL) 150 MG capsule TAKE 1 CAPSULE THREE TIMES A DAY 270 capsule 1 06/27/2014 at Unknown time  . Multiple Vitamin (MULTIVITAMIN WITH MINERALS) TABS tablet Take 1 tablet by mouth daily.   06/26/2014 at Unknown time  . Omega-3 Fatty Acids (FISH OIL) 1000 MG CAPS Take 1,000 mg by mouth 2 (two) times daily.    06/27/2014 at Unknown time  . ranitidine (ZANTAC) 150 MG tablet Take 150 mg by mouth every evening.   06/26/2014 at Unknown time  . rosuvastatin (CRESTOR) 5 MG tablet Take 5 mg by mouth 2 (two)  times a week. Every Tuesday and Saturday   Past Week at Unknown time  . sotalol (BETAPACE) 80 MG tablet Take 80 mg by mouth 2 (two) times daily.   06/27/2014 at 0700  . traMADol (ULTRAM) 50 MG tablet Take 50 mg by mouth every 6 (six) hours as needed for severe pain.    Past Week at Unknown time  . warfarin (COUMADIN) 2.5 MG tablet Take 2.5-3.75 mg by mouth daily at 6 PM. Take 3.75mg  ONLY on Friday Take 2.5mg  all other days   06/26/2014 at Unknown time  . meclizine (ANTIVERT) 25 MG tablet TAKE 1 TABLET (25 MG TOTAL) BY MOUTH 3 (THREE) TIMES DAILY AS NEEDED. 270 tablet 2 06/20/2014 at 0600  . oxyCODONE (OXY IR/ROXICODONE) 5 MG immediate release tablet Take 5 mg by mouth daily as needed for severe pain.    Completed Course at Unknown time    Inpatient Medications:  . acetaminophen  1,000 mg Oral TID  . ALPRAZolam  0.125 mg Oral 4 times per day  . carvedilol  3.125 mg Oral BID WC  . enoxaparin (LOVENOX) injection  40 mg Subcutaneous Q12H  . famotidine  20 mg Oral Daily  . levothyroxine  132 mcg Oral Once per day on Fri  . levothyroxine  88 mcg Oral Once per day on Sun Mon Tue Wed Thu Sat  . lip balm   Topical BID  . loratadine  10 mg Oral Daily  . metronidazole  500 mg Intravenous Q6H  . mexiletine  150 mg Oral 3 times per day  . rosuvastatin  5 mg Oral Once per day on  Tue Sat  . saccharomyces boulardii  250 mg Oral BID  . sodium chloride  3 mL Intravenous Q12H  . vancomycin  125 mg Oral QID    Allergies:  Allergies  Allergen Reactions  . Iohexol Hives and Itching     Code: HIVES, Desc: hives w/ itching during cardiac cath '99, mult caths since w/ ? premeds, DR.G.HAYES REQUESTS 13 HR PRE MED//A.C.pt okay w/13 hr prep/mms, Onset Date: [id number]   . Prednisone Hives    History   Social History  . Marital Status: Married    Spouse Name: N/A    Number of Children: N/A  . Years of Education: N/A   Occupational History  . retired      used to [employer] forces operational officer   Social History Main Topics  . Smoking status: Former Smoker    Quit date: 01/07/1994  . Smokeless tobacco: Never Used  . Alcohol Use: Yes     Comment: very rare  . Drug Use: No  . Sexual Activity: Not Currently   Other Topics Concern  . Not on file   Social History Narrative     Family History  Problem Relation Age of Onset  . Stroke Mother   . Heart attack Mother   . Heart disease Mother   . Hyperlipidemia Mother   . Hypertension Mother   . Heart attack Father   . Heart disease Father   . Hyperlipidemia Father   . Hypertension Father   . Heart disease Sister   . Hyperlipidemia Sister   . Hypertension Brother     BP 129/61 mmHg  Pulse 83  Temp(Src) 98.7 F (37.1 C) (Oral)  Resp 15  Ht 5\' 8"  (1.727 m)  Wt 177 lb 12.8 oz (80.65 kg)  BMI 27.04 kg/m2  SpO2 98%  Physical Exam: stable appearing 74 yo man, NAD HEENT: Unremarkable except indwelling  NT tube, [place], AT Neck:  6 JVD, no thyromegally Back:  No CVA tenderness Lungs:  Clear with no wheezes, rales, or rhonchi HEART:  Regular rate rhythm, no murmurs, no rubs, no clicks Abd:  soft, positive bowel sounds, no organomegally, no rebound, no guarding, multiple ecchymosis in lower abdomen Ext:  2 plus pulses, no edema, no cyanosis, no clubbing Skin:  No rashes no nodules Neuro:  CN II through XII intact, motor  grossly intact   Labs:   Lab Results  Component Value Date   WBC 9.1 06/30/2014   HGB 9.7* 07/01/2014   HCT 26.4* 06/30/2014   MCV 97.8 06/30/2014   PLT 359 06/30/2014     Recent Labs Lab 07/01/14 0531  NA 141  K 4.3  CL 100  CO2 33*  BUN 32*  CREATININE 1.51*  CALCIUM 9.3  GLUCOSE 131*    Radiology/Studies: Dg Chest 2 View 06/28/2014   CLINICAL DATA:  Coronary artery disease.  EXAM: CHEST  2 VIEW  COMPARISON:  06/15/2014.  FINDINGS: Mediastinum and hilar structures are normal. Cardiac pacer in stable position. Stable cardiomegaly. No pulmonary venous congestion or interstitial edema noted on today's exam. Bibasilar subsegmental atelectasis. No pleural effusion or pneumothorax. Mild bowel distention noted in the upper abdomen. Abdominal series suggested to further evaluate. These changes may sec be secondary to adynamic ileus. Abdominal aortic stent graft noted.  IMPRESSION: 1. Interval clearing of congestive heart failure and interstitial edema. Mild left basilar segment atelectasis .  2. Mild bowel distention in the upper abdomen. These changes may be secondary to adynamic ileus. Abdominal series suggested to further evaluate. Abdominal aortic stent graft noted.   Electronically Signed   By: Marcello Moores  Register   On: 06/28/2014 07:59   Dg Chest 2 View 06/15/2014   CLINICAL DATA:  Initial evaluation for shortness of breath.  Acute.  EXAM: CHEST  2 VIEW  COMPARISON:  Prior study from 01/20/2014  FINDINGS: Left-sided transvenous pacemaker/AICD is stable. Cardiomegaly unchanged. Mediastinal silhouette within normal limits. Tortuosity of the intrathoracic aorta noted.  Lungs are normally inflated. There is increased pulmonary vascular congestion with indistinctness of the interstitial markings, compatible with diffuse interstitial edema. No definite focal infiltrates. No pleural effusion. No pneumothorax.  No acute osseus abnormality.  IMPRESSION: Stable cardiomegaly with findings most  consistent with mild to moderate diffuse pulmonary interstitial edema.   Electronically Signed   By: Jeannine Boga M.D.   On: 06/15/2014 02:21   EKG: nsr with AV pacing  TELEMETRY: AV pacing with frequent PVC's, VT terminated with ATP  Device Interrogation: pending  A/P 1. Recurrent VT 2. S/p lap hernia repair 3. Ischemic CM 4. Chronic systolic heart failure 5. C.Diff colitis 6. Ileus, s/p NG tube Rec: The patient has just restarted his anti-arrhythmic medical therapy. His VT should settle down with Mexitil and sotalol. Will interogate ICD. Continue supportive care.   Mikle Bosworth.D.

## 2014-07-01 NOTE — Progress Notes (Signed)
Patient transferred to [place] via wheelchair.  All belongings sent with patient.  Report called to [facility], RN on [place].

## 2014-07-02 LAB — CBC
HEMATOCRIT: 32.7 % — AB (ref 39.0–52.0)
Hemoglobin: 10.5 g/dL — ABNORMAL LOW (ref 13.0–17.0)
MCH: 32.3 pg (ref 26.0–34.0)
MCHC: 32.1 g/dL (ref 30.0–36.0)
MCV: 100.6 fL — ABNORMAL HIGH (ref 78.0–100.0)
Platelets: 458 10*3/uL — ABNORMAL HIGH (ref 150–400)
RBC: 3.25 MIL/uL — ABNORMAL LOW (ref 4.22–5.81)
RDW: 16.5 % — ABNORMAL HIGH (ref 11.5–15.5)
WBC: 10.4 10*3/uL (ref 4.0–10.5)

## 2014-07-02 LAB — BASIC METABOLIC PANEL
ANION GAP: 9 (ref 5–15)
BUN: 24 mg/dL — ABNORMAL HIGH (ref 6–23)
CHLORIDE: 101 mmol/L (ref 96–112)
CO2: 29 mmol/L (ref 19–32)
Calcium: 8.9 mg/dL (ref 8.4–10.5)
Creatinine, Ser: 1.33 mg/dL (ref 0.50–1.35)
GFR calc Af Amer: 60 mL/min — ABNORMAL LOW (ref >90)
GFR, EST NON AFRICAN AMERICAN: 52 mL/min — AB (ref >90)
GLUCOSE: 165 mg/dL — AB (ref 70–99)
Potassium: 3.7 mmol/L (ref 3.5–5.1)
Sodium: 139 mmol/L (ref 135–145)

## 2014-07-02 MED ORDER — SODIUM CHLORIDE 0.9 % IV SOLN: 250.0000 mL | INTRAVENOUS | Status: DC | PRN

## 2014-07-02 MED ORDER — ACETAMINOPHEN 650 MG RE SUPP: 650.0000 mg | Freq: Four times a day (QID) | RECTAL | Status: DC | PRN

## 2014-07-02 MED ORDER — SODIUM CHLORIDE 0.9 % IJ SOLN
3.0000 mL | INTRAMUSCULAR | Status: DC | PRN
  Administered 2014-07-03 – 2014-07-04 (×2): 3 mL via INTRAVENOUS
  Filled 2014-07-02 (×2): qty 3

## 2014-07-02 MED ORDER — WARFARIN SODIUM 4 MG PO TABS
4.0000 mg | ORAL_TABLET | Freq: Once | ORAL | Status: DC
  Filled 2014-07-02: qty 1

## 2014-07-02 MED ORDER — SOTALOL HCL 80 MG PO TABS
80.0000 mg | ORAL_TABLET | Freq: Two times a day (BID) | ORAL | Status: DC
  Administered 2014-07-02 – 2014-07-05 (×6): 80 mg via ORAL
  Filled 2014-07-02 (×7): qty 1

## 2014-07-02 MED ORDER — WARFARIN SODIUM 3 MG PO TABS
3.0000 mg | ORAL_TABLET | Freq: Once | ORAL | Status: AC
  Administered 2014-07-02: 3 mg via ORAL
  Filled 2014-07-02: qty 1

## 2014-07-02 MED ORDER — METOCLOPRAMIDE HCL 5 MG/ML IJ SOLN
5.0000 mg | Freq: Four times a day (QID) | INTRAMUSCULAR | Status: DC | PRN
  Administered 2014-07-02 – 2014-07-03 (×2): 10 mg via INTRAVENOUS
  Filled 2014-07-02 (×4): qty 2

## 2014-07-02 MED ORDER — ENOXAPARIN SODIUM 60 MG/0.6ML ~~LOC~~ SOLN
60.0000 mg | Freq: Two times a day (BID) | SUBCUTANEOUS | Status: DC
  Administered 2014-07-02 (×2): 60 mg via SUBCUTANEOUS
  Filled 2014-07-02 (×4): qty 0.6

## 2014-07-02 MED ORDER — WARFARIN - PHARMACIST DOSING INPATIENT
Freq: Every day | Status: DC
  Administered 2014-07-04: 18:00:00

## 2014-07-02 MED ORDER — ACETAMINOPHEN 325 MG PO TABS
325.0000 mg | ORAL_TABLET | Freq: Four times a day (QID) | ORAL | Status: DC | PRN
  Administered 2014-07-04: 650 mg via ORAL
  Filled 2014-07-02: qty 2

## 2014-07-02 MED ORDER — SODIUM CHLORIDE 0.9 % IJ SOLN
3.0000 mL | Freq: Two times a day (BID) | INTRAMUSCULAR | Status: DC
  Administered 2014-07-02 – 2014-07-04 (×5): 3 mL via INTRAVENOUS

## 2014-07-02 MED ORDER — LACTATED RINGERS IV BOLUS (SEPSIS)
1000.0000 mL | Freq: Three times a day (TID) | INTRAVENOUS | Status: AC | PRN
  Administered 2014-07-02: 1000 mL via INTRAVENOUS
  Filled 2014-07-02: qty 1000

## 2014-07-02 NOTE — Progress Notes (Signed)
ANTICOAGULATION CONSULT NOTE - Initial Consult  Pharmacy Consult for LMWH and coumadin Indication: intermittent VT  Allergies  Allergen Reactions  . Iohexol Hives and Itching     Code: HIVES, Desc: hives w/ itching during cardiac cath '99, mult caths since w/ ? premeds, DR.G.HAYES REQUESTS 13 HR PRE MED//A.C.pt okay w/13 hr prep/mms, Onset Date: [id number]   . Prednisone Hives    Patient Measurements: Height: 5\' 8"  (172.7 cm) Weight: 167 lb 8.8 oz (76 kg) IBW/kg (Calculated) : 68.4   Vital Signs: Temp: 99 F (37.2 C) (02/02 0456) Temp Source: Oral (02/02 0456) BP: 118/53 mmHg (02/02 0456) Pulse Rate: 88 (02/02 0456)  Labs:  Recent Labs  06/29/14 1038 06/30/14 0544 07/01/14 0531 07/02/14 0342  HGB 9.8* 8.6* 9.7* 10.5*  HCT 29.2* 26.4*  --  32.7*  PLT 364 359  --  458*  LABPROT  --  18.9* 18.8*  --   INR  --  1.57* 1.56*  --   CREATININE  --  1.56* 1.51* 1.33    Estimated Creatinine Clearance: 48.6 mL/min (by C-G formula based on Cr of 1.33).   Medical History: Past Medical History  Diagnosis Date  . CAD (coronary artery disease) 1999    Remote anterolater and apical MI, cath 2011 patent stents RCA/LCx  LAD w/o obstruction  . Cardiomyopathy, ischemic     EF 20-25%  echo 2012  . Chronic systolic congestive heart failure   . ICD (implantable cardiac defibrillator) in place     [employer] Jude  . 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 Abdominal aortic aneurysm 2009    Repaired with endovascular stent  . PVD (peripheral vascular disease)     Has occluded innominate vein  . Myocardial infarction   . Hypertension   . Ventricular tachycardia     recurrent slow VT at 100-110 bpm,  Rx mexilitene/sotalol  . Left bundle branch block   . Systolic CHF, chronic   . Arthritis   . Gynecomastia     2/2 spironolactone  . Atrial fibrillation   . Pericarditis     due to perforation  . Inguinal hernia recurrent bilateral   . GERD (gastroesophageal reflux disease)   . Anemia  05/2014  . Cardiac arrest 12/05/2013  . Acute on chronic systolic heart failure 10/02/979  . 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 SVT (supraventricular tachycardia) 12/05/2013    Medications:  Prescriptions prior to admission  Medication Sig Dispense Refill Last Dose  . acetaminophen (TYLENOL) 500 MG tablet Take 1,000 mg by mouth every 6 (six) hours as needed for moderate pain.   06/26/2014 at Unknown time  . ALPRAZolam (XANAX) 0.25 MG tablet Take 0.5-1 tablets (0.125-0.25 mg total) by mouth 4 (four) times daily. (Patient taking differently: Take 0.125-0.25 mg by mouth See admin instructions. Takes 1/2 tab around 2AM, takes 1 tab at 6AM,  Takes 1/2 tab at 1030AM, takes 1 tab at [facility], takes 1/2 at [facility], and takes 1/2 tab at 9PM) 360 tablet 1 06/27/2014 at Unknown time  . Ascorbic Acid (VITAMIN C) 500 MG tablet Take 500 mg by mouth daily.     06/27/2014 at Unknown time  . carvedilol (COREG) 12.5 MG tablet Take 1 tablet (12.5 mg total) by mouth 2 (two) times daily with a meal. Take one tablet by mouth twice daily 60 tablet 11 06/27/2014 at 0700  . Docusate Calcium (STOOL SOFTENER PO) Take 1 capsule by  mouth daily.   06/26/2014 at Unknown time  . furosemide (LASIX) 20 MG tablet Take 20 mg by mouth 2 (two) times daily.   06/27/2014 at Unknown time  . levothyroxine (SYNTHROID, LEVOTHROID) 88 MCG tablet Take 88-132 mcg by mouth daily. Take 1.5 tablets on Friday and 1 tablet the rest of the week.   06/27/2014 at Unknown time  . loratadine (CLARITIN) 10 MG tablet Take 10 mg by mouth daily.   06/26/2014 at Unknown time  . losartan (COZAAR) 50 MG tablet Take 0.5 tablets (25 mg total) by mouth daily. 90 tablet 3 06/27/2014 at Unknown time  . meclizine (ANTIVERT) 25 MG tablet Take 25 mg by mouth 3 (three) times daily.   06/27/2014 at Unknown time  . mexiletine (MEXITIL) 150 MG capsule TAKE 1 CAPSULE THREE TIMES A DAY 270 capsule 1 06/27/2014 at Unknown time  . Multiple Vitamin  (MULTIVITAMIN WITH MINERALS) TABS tablet Take 1 tablet by mouth daily.   06/26/2014 at Unknown time  . Omega-3 Fatty Acids (FISH OIL) 1000 MG CAPS Take 1,000 mg by mouth 2 (two) times daily.    06/27/2014 at Unknown time  . ranitidine (ZANTAC) 150 MG tablet Take 150 mg by mouth every evening.   06/26/2014 at Unknown time  . rosuvastatin (CRESTOR) 5 MG tablet Take 5 mg by mouth 2 (two) times a week. Every Tuesday and Saturday   Past Week at Unknown time  . sotalol (BETAPACE) 80 MG tablet Take 80 mg by mouth 2 (two) times daily.   06/27/2014 at 0700  . traMADol (ULTRAM) 50 MG tablet Take 50 mg by mouth every 6 (six) hours as needed for severe pain.    Past Week at Unknown time  . warfarin (COUMADIN) 2.5 MG tablet Take 2.5-3.75 mg by mouth daily at 6 PM. Take 3.75mg  ONLY on Friday Take 2.5mg  all other days   06/26/2014 at Unknown time  . meclizine (ANTIVERT) 25 MG tablet TAKE 1 TABLET (25 MG TOTAL) BY MOUTH 3 (THREE) TIMES DAILY AS NEEDED. 270 tablet 2 06/20/2014 at 0600  . oxyCODONE (OXY IR/ROXICODONE) 5 MG immediate release tablet Take 5 mg by mouth daily as needed for severe pain.    Completed Course at Unknown time    Assessment: 74 yo M s/p bilateral inguinal hernia repair via lap w/ mesh 06/20/14.  Complicated by hematoma of abdominal wall.  Pt on coumadin PTA for intermittent VT.  Pharmacy consulted to bridge with LMWH and resume coumadin.   Home dose coumadin: 2. 5 mg daily except 3.75 mg on Fridays, last dose 1/27.  Was previously on LMWH bridge 80 mg sq q12h. Wt 76 kg.  Creat 2.2>1.56>1.51>1.33.  Hg  7.5>transfusion>8.3>9.8>8.6>9.6>10.5. Was on flagyl which can increase INR- last dose at 0600 am today. Now changed to PO vancomycin for cdiff treatment.     Goal of Therapy:  INR 2-3 Anti-Xa level 0.6-1 units/ml 4hrs after LMWH dose given Monitor platelets by anticoagulation protocol: Yes   Plan:  -coumadin 3 mg po x 1 dose today -LMWH 60 mg sq q12h, as ordered by [facility], place in thigh, no  abdominal wall. Hold for Hgb < 8 or SBP < 80 given concerns of active bleeding; full dose of 1 mg/kg would be 80 mg sq q12h -daily INR -currently has daily CBC and BMET x 5 days per MD orders -per MD orders, DC LMWH/coum if Hbg < 8 -watch for s/sxs of bleeding  Eudelia Bunch, Pharm.D. [phone] 07/02/2014 8:51 AM

## 2014-07-02 NOTE — Progress Notes (Signed)
[facility]  [address] [place], [place] [address] Phone: [phone] FAX: [place] [phone] [birth year]  CARE TEAM:  PCP: Sheela Stack, MD  Outpatient Care Team: Patient Care Team: Sheela Stack, MD as PCP - General (Endocrinology) Darlin Coco, MD as Consulting Physician (Cardiology) Deboraha Sprang, MD as Consulting Physician (Cardiology) Serafina Mitchell, MD as Consulting Physician (Vascular Surgery) Michael Boston, MD as Consulting Physician (General Surgery)  Inpatient Treatment Team: Treatment Team: Attending Provider: Michael Boston, MD; Consulting Physician: Michae Kava Lbcardiology, MD; Technician: Francene Finders, NT; Registered Nurse: Suzan Nailer, RN; Registered Nurse: Wendall Mola, RN; Registered Nurse: Micki Riley, RN; Technician: Kathreen Cosier, NT; Registered Nurse: Cliffton Asters, RN   Subjective:  Runs of VT - no mjr sx - transferred to stepdown unit Problems with insomnia - uses Xanax QID Feeling better - pt & wife hopeful Wife in room   Objective:  Vital signs:  Filed Vitals:   07/01/14 1543 07/01/14 2049 07/02/14 0021 07/02/14 0456  BP: 135/68 130/63 147/61 118/53  Pulse: 95 86 81 88  Temp: 98.8 F (37.1 C) 98.4 F (36.9 C) 98.4 F (36.9 C) 99 F (37.2 C)  TempSrc: Oral Oral Oral Oral  Resp: '19 18 18 19  ' Height: '5\' 8"'  (1.727 m)     Weight: 167 lb 8.8 oz (76 kg)     SpO2: 97% 99% 97% 97%    Last BM Date: 07/01/14  Intake/Output   Yesterday:  02/01 0701 - 02/02 0700 In: 2327.5 [P.O.:1440; I.V.:537.5; IV Piggyback:200] Out: 1102 [Urine:1100; Stool:2] This shift:     Bowel function:  Flatus: y  BM: yes - more formed but still occ loose  Drain: thin bile  Physical Exam:  General: Pt awake/alert/oriented x4 in no acute distress.  Smiling, happy.  Better Eyes: PERRL, normal EOM.  Sclera clear.  No icterus Neuro: CN II-XII intact w/o focal  sensory/motor deficits. Lymph: No head/neck/groin lymphadenopathy Psych:  No delerium/psychosis/paranoia.  More alert.  Not confused HENT: Normocephalic, Mucus membranes moist.  No thrush Neck: Supple, No tracheal deviation Chest: No chest wall pain w good excursion CV:  Pulses intact.  Regular rhythm MS: Normal AROM mjr joints.  No obvious deformity Abdomen: Soft.  Nondistended.  Nontender at incisions.  Large ecchymosis in flank but softer.  No evidence of peritonitis.  No incarcerated hernias. GU:  NEMG.  Ecchymosis but min edema.  Small right groin 3cm mass c/w seroma/hematoma  Ext:  SCDs BLE.  No edema.  No cyanosis Skin: No petechiae / purpura   Problem List:   Principal Problem:   Symptomatic anemia Active Problems:   Cardiomyopathy, ischemic   Chronic systolic heart failure   Chronic anticoagulation   Biventricular implantable cardioverter-defibrillator in situ   HTN (hypertension)   Anxiety   Acute renal insufficiency   Clostridium difficile colitis   Bilateral inguinal hernia (BIH) s/p lap repair w mesh 06/20/2014   Hematoma of abdominal wall   Assessment  [name]  74 y.o. male       OK but many issues  Plan:  -I D/C'd NGT.   Adv to fulls, then solids as tolerated  -bowel regimen once better  -Change to PO Vancomycin for C diff - still very odd to get Cdiff w only one periop dose of ABx but probable indicator of fragility  -keep dry.  Cr stable/improving even w  ----I&O.  IV to  medlock w PRN IVF.  Cardiology can adjust  -no issues on telemetry.  Hopefully back on most cardiac meds soon  -restart anticoag while inpatient.  Full dose lovenox today, then full w PO warfarin tomorrow if Hgb stable.  Ask pharmacy to adjust since recent ARF & Cr still slightly elevated though improving.  NO SHOTS IN ABDOMINAL WALL!!!! I worry it will encourage bleeding again  -follow Hgb closely - transfuse if worsens.  No issues = doubt active bleed.    -iron for  anemia.  -Anxiolysis/insomnia.  IV lorazepam vs PO Xanax, depending on bowel fx.  Benadryl for backup - they feel IV works better  -VTE prophylaxis- SCDs, etc  -mobilize as tolerated to help recovery.  Get PT/OT involved  I updated the patient's status to the patient, his wife, and his RN.  Recommendations were made.  Questions were answered.  They expressed understanding & appreciation.   [name], M.D., F.A.C.S. Gastrointestinal and Minimally Invasive Surgery [facility] [address], [place], [place] [address] [phone] Main / Paging   07/02/2014   Results:   Labs: Results for orders placed or performed during the hospital encounter of 06/27/14 (from the past 48 hour(s))  Protime-INR     Status: Abnormal   Collection Time: 07/01/14  5:31 AM  Result Value Ref Range   Prothrombin Time 18.8 (H) 11.6 - 15.2 seconds   INR 1.56 (H) 0.00 - 1.49  Hemoglobin     Status: Abnormal   Collection Time: 07/01/14  5:31 AM  Result Value Ref Range   Hemoglobin 9.7 (L) 13.0 - 17.0 g/dL  Basic metabolic panel     Status: Abnormal   Collection Time: 07/01/14  5:31 AM  Result Value Ref Range   Sodium 141 135 - 145 mmol/L   Potassium 4.3 3.5 - 5.1 mmol/L   Chloride 100 96 - 112 mmol/L   CO2 33 (H) 19 - 32 mmol/L   Glucose, Bld 131 (H) 70 - 99 mg/dL   BUN 32 (H) 6 - 23 mg/dL   Creatinine, Ser 1.51 (H) 0.50 - 1.35 mg/dL   Calcium 9.3 8.4 - 10.5 mg/dL   GFR calc non Af Amer 44 (L) >90 mL/min   GFR calc Af Amer 51 (L) >90 mL/min    Comment: (NOTE) The eGFR has been calculated using the CKD EPI equation. This calculation has not been validated in all clinical situations. eGFR's persistently <90 mL/min signify possible Chronic Kidney Disease.    Anion gap 8 5 - 15  CBC     Status: Abnormal   Collection Time: 07/02/14  3:42 AM  Result Value Ref Range   WBC 10.4 4.0 - 10.5 K/uL   RBC 3.25 (L) 4.22 - 5.81 MIL/uL   Hemoglobin 10.5 (L) 13.0 -  17.0 g/dL   HCT 32.7 (L) 39.0 - 52.0 %   MCV 100.6 (H) 78.0 - 100.0 fL   MCH 32.3 26.0 - 34.0 pg   MCHC 32.1 30.0 - 36.0 g/dL   RDW 16.5 (H) 11.5 - 15.5 %   Platelets 458 (H) 150 - 400 K/uL  Basic metabolic panel     Status: Abnormal   Collection Time: 07/02/14  3:42 AM  Result Value Ref Range   Sodium 139 135 - 145 mmol/L   Potassium 3.7 3.5 - 5.1 mmol/L   Chloride 101 96 - 112 mmol/L   CO2 29 19 - 32 mmol/L   Glucose, Bld 165 (H) 70 -  99 mg/dL   BUN 24 (H) 6 - 23 mg/dL   Creatinine, Ser 1.33 0.50 - 1.35 mg/dL   Calcium 8.9 8.4 - 10.5 mg/dL   GFR calc non Af Amer 52 (L) >90 mL/min   GFR calc Af Amer 60 (L) >90 mL/min    Comment: (NOTE) The eGFR has been calculated using the CKD EPI equation. This calculation has not been validated in all clinical situations. eGFR's persistently <90 mL/min signify possible Chronic Kidney Disease.    Anion gap 9 5 - 15    Imaging / Studies: Dg Abd 2 Views  07/01/2014   CLINICAL DATA:  Ileus, assess NG tube positioning  EXAM: ABDOMEN - 2 VIEW  COMPARISON:  Supine and left-side-down decubitus films of the abdomen dated June 29, 2014  FINDINGS: The bowel gas pattern is normal. The previously demonstrated is dilated is gas-filled small bowel loops are not evident today. The nasogastric tube tip lies in the region of the proximal duodenum. There are calcifications which project over the lower poles of both kidneys which may reflect stones measuring up to 3 mm in diameter. There are numerous phleboliths in the pelvis. There is an aorto bi-iliac stent graft in place. The bony structures are unremarkable.  IMPRESSION: Interval resolution of the ileus type pattern since placement of the nasogastric tube. The bowel gas pattern today is normal. Probable bilateral kidney stones.   Electronically Signed   By: [name]  Martinique   On: 07/01/2014 07:49    Medications / Allergies: per chart  Antibiotics: Anti-infectives    Start     Dose/Rate Route Frequency  Ordered Stop   07/01/14 1000  vancomycin (VANCOCIN) 50 mg/mL oral solution 125 mg     125 mg Oral 4 times daily 07/01/14 0810 07/15/14 0959   06/30/14 0800  metroNIDAZOLE (FLAGYL) IVPB 500 mg     500 mg100 mL/hr over 60 Minutes Intravenous Every 6 hours 06/30/14 0706     06/30/14 0715  vancomycin (VANCOCIN) 50 mg/mL oral solution 125 mg  Status:  Discontinued     125 mg Oral 4 times per day 06/30/14 0706 06/30/14 0934       Note: Portions of this report may have been transcribed using voice recognition software. Every effort was made to ensure accuracy; however, inadvertent computerized transcription errors may be present.   Any transcriptional errors that result from this process are unintentional.

## 2014-07-02 NOTE — Progress Notes (Signed)
Pt had hick -ups off and on today seemed to precede his gagging in turn resulted in vomiting. Pt received Phenergan, Zofran, Reglan all IV also throat lozenges, throat spray, and magic mouthwash. Pt able to keep down a few meds long enough today ie: corag and coumadin. Following order for low urine output pt given 1,000 cc fluid bolus IV of LR

## 2014-07-02 NOTE — Progress Notes (Signed)
Patient ID: [name], male   DOB: [birth year], 74 y.o.   MRN: [id number]    Patient Name: [name] Date of Encounter: 07/02/2014     Principal Problem:   Symptomatic anemia Active Problems:   Cardiomyopathy, ischemic   Chronic systolic heart failure   Chronic anticoagulation   Biventricular implantable cardioverter-defibrillator in situ   HTN (hypertension)   Anxiety   Acute renal insufficiency   Clostridium difficile colitis   Bilateral inguinal hernia (BIH) s/p lap repair w mesh 06/20/2014   Hematoma of abdominal wall    SUBJECTIVE  No chest pain or sob.  CURRENT MEDS . acetaminophen  1,000 mg Oral TID  . ALPRAZolam  0.125 mg Oral 4 times per day  . carvedilol  3.125 mg Oral BID WC  . enoxaparin (LOVENOX) injection  60 mg Subcutaneous Q12H  . famotidine  20 mg Oral Daily  . levothyroxine  132 mcg Oral Once per day on Fri  . levothyroxine  88 mcg Oral Once per day on Sun Mon Tue Wed Thu Sat  . lip balm   Topical BID  . loratadine  10 mg Oral Daily  . mexiletine  150 mg Oral 3 times per day  . rosuvastatin  5 mg Oral Once per day on Tue Sat  . saccharomyces boulardii  250 mg Oral BID  . sodium chloride  3 mL Intravenous Q12H  . sodium chloride  3 mL Intravenous Q12H  . vancomycin  125 mg Oral QID  . warfarin  3 mg Oral ONCE-1800  . Warfarin - Pharmacist Dosing Inpatient   Does not apply q1800    OBJECTIVE  Filed Vitals:   07/01/14 2049 07/02/14 0021 07/02/14 0456 07/02/14 0857  BP: 130/63 147/61 118/53 131/70  Pulse: 86 81 88 76  Temp: 98.4 F (36.9 C) 98.4 F (36.9 C) 99 F (37.2 C)   TempSrc: Oral Oral Oral   Resp: 18 18 19    Height:      Weight:      SpO2: 99% 97% 97%     Intake/Output Summary (Last 24 hours) at 07/02/14 0906 Last data filed at 07/02/14 0900  Gross per 24 hour  Intake 2517.5 ml  Output    901 ml  Net 1616.5 ml   Filed Weights   06/27/14 0942 07/01/14 1543  Weight: 177 lb 12.8 oz (80.65 kg) 167 lb 8.8 oz (76  kg)    PHYSICAL EXAM  General: Pleasant, NAD. Neuro: Alert and oriented X 3. Moves all extremities spontaneously. Psych: Normal affect. HEENT:  Normal  Neck: Supple without bruits or JVD. Lungs:  Resp regular and unlabored, CTA. Heart: RRR no s3, s4, or murmurs. Abdomen: Soft, non-tender, non-distended, BS + x 4.  Extremities: No clubbing, cyanosis or edema. DP/PT/Radials 2+ and equal bilaterally.  Accessory Clinical Findings  CBC  Recent Labs  06/30/14 0544 07/01/14 0531 07/02/14 0342  WBC 9.1  --  10.4  HGB 8.6* 9.7* 10.5*  HCT 26.4*  --  32.7*  MCV 97.8  --  100.6*  PLT 359  --  950*   Basic Metabolic Panel  Recent Labs  07/01/14 0531 07/02/14 0342  NA 141 139  K 4.3 3.7  CL 100 101  CO2 33* 29  GLUCOSE 131* 165*  BUN 32* 24*  CREATININE 1.51* 1.33  CALCIUM 9.3 8.9   Liver Function Tests No results for input(s): AST, ALT, ALKPHOS, BILITOT, PROT, ALBUMIN in the last 72 hours. No results for input(s): LIPASE,  AMYLASE in the last 72 hours. Cardiac Enzymes No results for input(s): CKTOTAL, CKMB, CKMBINDEX, TROPONINI in the last 72 hours. BNP Invalid input(s): POCBNP D-Dimer No results for input(s): DDIMER in the last 72 hours. Hemoglobin A1C No results for input(s): HGBA1C in the last 72 hours. Fasting Lipid Panel No results for input(s): CHOL, HDL, LDLCALC, TRIG, CHOLHDL, LDLDIRECT in the last 72 hours. Thyroid Function Tests No results for input(s): TSH, T4TOTAL, T3FREE, THYROIDAB in the last 72 hours.  Invalid input(s): FREET3  TELE  nsr with VT  Radiology/Studies  Dg Chest 2 View  06/28/2014   CLINICAL DATA:  Coronary artery disease.  EXAM: CHEST  2 VIEW  COMPARISON:  06/15/2014.  FINDINGS: Mediastinum and hilar structures are normal. Cardiac pacer in stable position. Stable cardiomegaly. No pulmonary venous congestion or interstitial edema noted on today's exam. Bibasilar subsegmental atelectasis. No pleural effusion or pneumothorax. Mild  bowel distention noted in the upper abdomen. Abdominal series suggested to further evaluate. These changes may sec be secondary to adynamic ileus. Abdominal aortic stent graft noted.  IMPRESSION: 1. Interval clearing of congestive heart failure and interstitial edema. Mild left basilar segment atelectasis .  2. Mild bowel distention in the upper abdomen. These changes may be secondary to adynamic ileus. Abdominal series suggested to further evaluate. Abdominal aortic stent graft noted.   Electronically Signed   By: Marcello Moores  Register   On: 06/28/2014 07:59   Dg Chest 2 View  06/15/2014   CLINICAL DATA:  Initial evaluation for shortness of breath.  Acute.  EXAM: CHEST  2 VIEW  COMPARISON:  Prior study from 01/20/2014  FINDINGS: Left-sided transvenous pacemaker/AICD is stable. Cardiomegaly unchanged. Mediastinal silhouette within normal limits. Tortuosity of the intrathoracic aorta noted.  Lungs are normally inflated. There is increased pulmonary vascular congestion with indistinctness of the interstitial markings, compatible with diffuse interstitial edema. No definite focal infiltrates. No pleural effusion. No pneumothorax.  No acute osseus abnormality.  IMPRESSION: Stable cardiomegaly with findings most consistent with mild to moderate diffuse pulmonary interstitial edema.   Electronically Signed   By: Jeannine Boga M.D.   On: 06/15/2014 02:21   Dg Chest Port 1 View  06/29/2014   CLINICAL DATA:  Shortness of breath, vomiting  EXAM: PORTABLE CHEST - 1 VIEW  COMPARISON:  the previous day's study  FINDINGS: Stable left subclavian AICD with an additional right subclavian lead. Low lung volumes. No focal infiltrate or overt edema. Heart size upper limits normal. No pneumothorax. No effusion. Visualized skeletal structures are unremarkable.  IMPRESSION: No acute cardiopulmonary disease.   Electronically Signed   By: Arne Cleveland M.D.   On: 06/29/2014 09:49   Dg Abd 2 Views  07/01/2014   CLINICAL DATA:   Ileus, assess NG tube positioning  EXAM: ABDOMEN - 2 VIEW  COMPARISON:  Supine and left-side-down decubitus films of the abdomen dated June 29, 2014  FINDINGS: The bowel gas pattern is normal. The previously demonstrated is dilated is gas-filled small bowel loops are not evident today. The nasogastric tube tip lies in the region of the proximal duodenum. There are calcifications which project over the lower poles of both kidneys which may reflect stones measuring up to 3 mm in diameter. There are numerous phleboliths in the pelvis. There is an aorto bi-iliac stent graft in place. The bony structures are unremarkable.  IMPRESSION: Interval resolution of the ileus type pattern since placement of the nasogastric tube. The bowel gas pattern today is normal. Probable bilateral kidney stones.  Electronically Signed   By: [name]  Martinique   On: 07/01/2014 07:49   Dg Abd Portable 2v  06/29/2014   CLINICAL DATA:  Acute onset of vomiting. Assess for ileus. Initial encounter.  EXAM: PORTABLE ABDOMEN - 2 VIEW  COMPARISON:  CT of the abdomen and pelvis performed 03/13/2013, and abdominal radiograph performed 11/24/2007  FINDINGS: There is mild distention of the stomach with air. Small bowel loops are dilated to 4.0 cm in maximal diameter, though air is still seen within the transverse colon. The appearance is suggestive of partial small bowel obstruction. There is no definite evidence for diffuse ileus, as the colon remains normal in caliber. No free intra-abdominal air is identified on the provided decubitus view.  No acute osseous abnormalities are seen. An aortoiliac stent graft is noted.  IMPRESSION: Mild distention of the stomach with air. Small bowel loops are dilated to 4.0 cm in maximal diameter, though air is still seen within the transverse colon. The appearance is suggestive of partial small bowel obstruction. No free intra-abdominal air seen.   Electronically Signed   By: Garald Balding M.D.   On: 06/29/2014  09:26    ASSESSMENT AND PLAN  1. VT 2. S/p lap/hernia repair 3. Post op ileus, now taking liquids Rec: ok to continue usual post op activity. His VT (which is very slow and well tolerated) should settle down once he is back on his AA drugs. Continue usual post op course. He may be discharged home when ok with surgical service.   Gregg Taylor,M.D.  07/02/2014 9:06 AM

## 2014-07-02 NOTE — Progress Notes (Signed)
Call made to [facility] surgery spoke to DR Clyda Greener.   Pt having trouble keeping down clear liquids. Had Phenergan IV, PT violently clearing his throat and using yankeur to help clear secretions. Has emesis after  late clear liquid breakfast of 200 cc . Now small amt of phlegm flecked with small red spots. Will give Zofran, magic mouthwash, lozenges and keep trying small sips of water.

## 2014-07-03 ENCOUNTER — Inpatient Hospital Stay (HOSPITAL_COMMUNITY): Payer: Medicare Other

## 2014-07-03 DIAGNOSIS — I472 Ventricular tachycardia: Secondary | ICD-10-CM

## 2014-07-03 DIAGNOSIS — I4729 Other ventricular tachycardia: Secondary | ICD-10-CM

## 2014-07-03 LAB — PROTIME-INR
INR: 1.87 — ABNORMAL HIGH (ref 0.00–1.49)
Prothrombin Time: 21.7 seconds — ABNORMAL HIGH (ref 11.6–15.2)

## 2014-07-03 LAB — BASIC METABOLIC PANEL
ANION GAP: 4 — AB (ref 5–15)
BUN: 20 mg/dL (ref 6–23)
CO2: 37 mmol/L — AB (ref 19–32)
Calcium: 8.9 mg/dL (ref 8.4–10.5)
Chloride: 102 mmol/L (ref 96–112)
Creatinine, Ser: 1.27 mg/dL (ref 0.50–1.35)
GFR, EST AFRICAN AMERICAN: 63 mL/min — AB (ref >90)
GFR, EST NON AFRICAN AMERICAN: 55 mL/min — AB (ref >90)
GLUCOSE: 133 mg/dL — AB (ref 70–99)
Potassium: 3.9 mmol/L (ref 3.5–5.1)
SODIUM: 143 mmol/L (ref 135–145)

## 2014-07-03 LAB — CBC
HCT: 33.8 % — ABNORMAL LOW (ref 39.0–52.0)
Hemoglobin: 10.5 g/dL — ABNORMAL LOW (ref 13.0–17.0)
MCH: 31.3 pg (ref 26.0–34.0)
MCHC: 31.1 g/dL (ref 30.0–36.0)
MCV: 100.9 fL — ABNORMAL HIGH (ref 78.0–100.0)
PLATELETS: 510 10*3/uL — AB (ref 150–400)
RBC: 3.35 MIL/uL — ABNORMAL LOW (ref 4.22–5.81)
RDW: 16.3 % — ABNORMAL HIGH (ref 11.5–15.5)
WBC: 13.6 10*3/uL — ABNORMAL HIGH (ref 4.0–10.5)

## 2014-07-03 MED ORDER — PANTOPRAZOLE SODIUM 40 MG PO TBEC
40.0000 mg | DELAYED_RELEASE_TABLET | Freq: Two times a day (BID) | ORAL | Status: DC
  Administered 2014-07-03 – 2014-07-05 (×4): 40 mg via ORAL
  Filled 2014-07-03 (×4): qty 1

## 2014-07-03 MED ORDER — MAGIC MOUTHWASH
15.0000 mL | Freq: Four times a day (QID) | ORAL | Status: AC
  Administered 2014-07-03 – 2014-07-04 (×7): 15 mL via ORAL
  Filled 2014-07-03 (×8): qty 15

## 2014-07-03 MED ORDER — WARFARIN SODIUM 3 MG PO TABS
3.0000 mg | ORAL_TABLET | Freq: Once | ORAL | Status: AC
  Administered 2014-07-03: 3 mg via ORAL
  Filled 2014-07-03 (×2): qty 1

## 2014-07-03 MED ORDER — CHLORPROMAZINE HCL 25 MG/ML IJ SOLN
12.5000 mg | Freq: Three times a day (TID) | INTRAMUSCULAR | Status: DC | PRN
  Filled 2014-07-03: qty 0.5

## 2014-07-03 MED ORDER — METOCLOPRAMIDE HCL 5 MG/ML IJ SOLN
5.0000 mg | Freq: Four times a day (QID) | INTRAMUSCULAR | Status: DC
  Administered 2014-07-03 – 2014-07-05 (×8): 5 mg via INTRAVENOUS
  Filled 2014-07-03 (×15): qty 1

## 2014-07-03 NOTE — Progress Notes (Signed)
Patient OOB 3 hours in AM. Ativan and Reglan given prior to breakfast. Pt advance to Puree. No evidence hiccups with pt tolerating 50% meal. Transfer to 5W at 11:30. Receiving RN aware to increase activity level for pt today and monitor response to Reglan tx.

## 2014-07-03 NOTE — Progress Notes (Signed)
Pt. Arrived to the unit via bed accompanied by wife. Pt. Is alert and oriented with no signs of distress noted. Pt. Vitals appear stable with no skin issues noted. Educated pt. On use of staff numbers, room telephone and call bell. Call light within reach. Orders released. No further needs noted at this time.

## 2014-07-03 NOTE — Progress Notes (Signed)
Patient asked not to be woken up after this time. He states that he wants to sleep through out the night and does not want any more medications until the morning. Will continue to monitor

## 2014-07-03 NOTE — Progress Notes (Signed)
ANTICOAGULATION CONSULT NOTE - Initial Consult  Pharmacy Consult for LMWH and coumadin Indication: intermittent VT  Allergies  Allergen Reactions  . Iohexol Hives and Itching     Code: HIVES, Desc: hives w/ itching during cardiac cath '99, mult caths since w/ ? premeds, DR.G.HAYES REQUESTS 13 HR PRE MED//A.C.pt okay w/13 hr prep/mms, Onset Date: [id number]   . Prednisone Hives    Patient Measurements: Height: 5\' 8"  (172.7 cm) Weight: 167 lb 8.8 oz (76 kg) IBW/kg (Calculated) : 68.4   Vital Signs: Temp: 98.6 F (37 C) (02/03 0753) Temp Source: Oral (02/03 0753) BP: 116/60 mmHg (02/03 0753) Pulse Rate: 73 (02/03 0700)  Labs:  Recent Labs  07/01/14 0531 07/02/14 0342 07/03/14 0354  HGB 9.7* 10.5* 10.5*  HCT  --  32.7* 33.8*  PLT  --  458* 510*  LABPROT 18.8*  --  21.7*  INR 1.56*  --  1.87*  CREATININE 1.51* 1.33 1.27    Estimated Creatinine Clearance: 50.9 mL/min (by C-G formula based on Cr of 1.27).   Assessment: 74 yo M s/p bilateral inguinal hernia repair via lap w/ mesh 06/20/14.  Complicated by hematoma of abdominal wall.  Pt on coumadin PTA for intermittent VT.  Pharmacy consulted to bridge with LMWH and resume coumadin on 07/02/14.   Home dose coumadin: 2. 5 mg daily except 3.75 mg on Fridays, last dose 1/27.  Was previously on LMWH bridge 80 mg sq q12h. Wt 76 kg.  Creat 2.2>1.56>1.51>1.33>1.27.  Hg  7.5>transfusion>8.3>9.8>8.6>9.6>10.5>10.5. Was on flagyl which can increase INR- last dose at 0600 2/2. Now changed to PO vancomycin for cdiff treatment.  INR up to 1.87 after 3 mg coumadin yesterday.  No bleeding reported.     Goal of Therapy:  INR 2-3 Anti-Xa level 0.6-1 units/ml 4hrs after LMWH dose given Monitor platelets by anticoagulation protocol: Yes   Plan:  -repeat coumadin 3 mg po x 1 dose today -LMWH 60 mg sq q12h, as ordered by [facility], place in thigh, no abdominal wall. Hold for Hgb < 8 or SBP < 80 given concerns of active bleeding; full dose of 1 mg/kg  would be 80 mg sq q12h -daily INR -currently has daily CBC and BMET x 5 days per MD orders -per MD orders, DC LMWH/coum if Hbg < 8 -watch for s/sxs of bleeding  Eudelia Bunch, Pharm.D. [phone] 07/03/2014 8:37 AM

## 2014-07-03 NOTE — Progress Notes (Addendum)
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   He presented with VT storm in September of 2012 and underwent ablation at that time. He had recurrent VT post ablation and was transferred to [facility] where he underwent repeat ablation complicated by pericardial perforation and subsequent pericarditis. He was treated with Sotalol and Mexilitine was also added due to recurrent VT. He then had recurrent slow VT at 110-130 bpm and underwent repeat ablation in March of 2013. He has had recurrent  VT without shocks but with ATP  He was admitted for B inguinal hernia repair done laproscopically but was readmitted for postoperative anemia occurring in the context of perioperative LMWH bridging Rx by Dr TB  Hbb 7.5;  This was further complicated by acute renal failure which prompted the holding of his sotalol\  Renal function as now recovered and mexiletine  And   sotalol  Resumed  the latter yesterday  ,  Weak and nauseated  Trying to eat but still vomiting   Past Medical History  Diagnosis Date  . CAD (coronary artery disease) 1999    Remote anterolater and apical MI, cath 2011 patent stents RCA/LCx  LAD w/o obstruction  . Cardiomyopathy, ischemic     EF 20-25%  echo 2012  . Chronic systolic congestive heart failure   . ICD (implantable cardiac defibrillator) in place     [place] [facility]  . 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 Abdominal aortic aneurysm 2009    Repaired with endovascular stent  . PVD (peripheral vascular disease)     Has occluded innominate vein  . Myocardial infarction   . Hypertension   . Ventricular tachycardia     recurrent slow VT at 100-110 bpm,  Rx mexilitene/sotalol  . Left bundle branch  block   . Systolic CHF, chronic   . Arthritis   . Gynecomastia     2/2 spironolactone  . Atrial fibrillation   . Pericarditis     due to perforation  . Inguinal hernia recurrent bilateral   . GERD (gastroesophageal reflux disease)   . Anemia 05/2014  . Cardiac arrest 12/05/2013  . Acute on chronic systolic heart failure 0/01/6282  . 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 SVT (supraventricular tachycardia) 12/05/2013    Scheduled Meds:  Scheduled Meds: . ALPRAZolam  0.125 mg Oral 4 times per day  . carvedilol  3.125 mg Oral BID WC  . levothyroxine  132 mcg Oral Once per day on Fri  . levothyroxine  88 mcg Oral Once per day on Sun Mon Tue Wed Thu Sat  . lip balm   Topical BID  . loratadine  10 mg Oral Daily  . magic mouthwash  15 mL Oral QID  . metoCLOPramide (REGLAN) injection  5 mg Intravenous 4 times per day  . mexiletine  150 mg Oral 3 times per day  . pantoprazole  40 mg Oral BID AC  . rosuvastatin  5 mg Oral Once per day on Tue Sat  . saccharomyces boulardii  250 mg Oral BID  . sodium chloride  3 mL Intravenous Q12H  . sodium chloride  3 mL Intravenous Q12H  . sotalol  80 mg Oral  Q12H  . vancomycin  125 mg Oral QID  . warfarin  3 mg Oral ONCE-1800  . Warfarin - Pharmacist Dosing Inpatient   Does not apply q1800       PHYSICAL EXAM Filed Vitals:   07/03/14 0753 07/03/14 1150 07/03/14 1153 07/03/14 1420  BP: 116/60 119/50 119/50 134/86  Pulse:  100 86 95  Temp: 98.6 F (37 C) 98.7 F (37.1 C) 98.7 F (37.1 C)   TempSrc: Oral Oral Oral   Resp:  18 18 18   Height:      Weight:      SpO2:  87% 96% 99%    General appearance: alert, cachectic and mild distress Neck: JVD - 7 cm above sternal notch Lungs: tubular sounds bilaterally Heart: regular rate and rhythm Abdomen: soft, non-tender; bowel sounds normal; no masses,  no organomegaly Extremities: edema none Skin: Skin color, texture, turgor normal. No rashes or  lesions Neurologic: Grossly normal  TELEMETRY: Reviewed telemetry pt in sinus with pac and appears to have inappropriate therapy in sinus tach    Intake/Output Summary (Last 24 hours) at 07/03/14 1449 Last data filed at 07/03/14 1421  Gross per 24 hour  Intake   1320 ml  Output    675 ml  Net    645 ml    LABS: Basic Metabolic Panel:  Recent Labs Lab 06/28/14 0335 06/29/14 0430 06/30/14 0544 07/01/14 0531 07/02/14 0342 07/03/14 0354  NA 129* 131* 136 141 139 143  K 4.9 4.9 4.6 4.3 3.7 3.9  CL 94* 93* 99 100 101 102  CO2 28 31 31  33* 29 37*  GLUCOSE 114* 150* 130* 131* 165* 133*  BUN 64* 48* 36* 32* 24* 20  CREATININE 3.50* 2.21* 1.56* 1.51* 1.33 1.27  CALCIUM 8.9 9.6 8.9 9.3 8.9 8.9   Cardiac Enzymes: No results for input(s): CKTOTAL, CKMB, CKMBINDEX, TROPONINI in the last 72 hours. CBC:  Recent Labs Lab 06/28/14 0335 06/28/14 1503 06/29/14 0430 06/29/14 1038 06/30/14 0544 07/01/14 0531 07/02/14 0342 07/03/14 0354  WBC 16.5*  --   --  14.5* 9.1  --  10.4 13.6*  HGB 8.1* 8.3* 9.8* 9.8* 8.6* 9.7* 10.5* 10.5*  HCT 23.2*  --   --  29.2* 26.4*  --  32.7* 33.8*  MCV 91.7  --   --  97.3 97.8  --  100.6* 100.9*  PLT 265  --   --  364 359  --  458* 510*   PROTIME:  Recent Labs  07/01/14 0531 07/03/14 0354  LABPROT 18.8* 21.7*  INR 1.56* 1.87*   Liver Function Tests: No results for input(s): AST, ALT, ALKPHOS, BILITOT, PROT, ALBUMIN in the last 72 hours. No results for input(s): LIPASE, AMYLASE in the last 72 hours. BNP: BNP (last 3 results)  Recent Labs  06/15/14 0139 06/29/14 1038  BNP 1001.8* 1486.2*    ProBNP (last 3 results)  Recent Labs  12/05/13 0047 01/17/14 2153  PROBNP 2438.0* 3228.0*    D-Dimer: No results for input(s): DDIMER in the last 72 hours. Hemoglobin A1C: No results for input(s): HGBA1C in the last 72 hours. Fasting Lipid Panel: No results for input(s): CHOL, HDL, LDLCALC, TRIG, CHOLHDL, LDLDIRECT in the last 72  hours. Thyroid Function Tests: No results for input(s): TSH, T4TOTAL, T3FREE, THYROIDAB in the last 72 hours.  Invalid input(s): FREET3 Anemia Panel: No results for input(s): VITAMINB12, FOLATE, FERRITIN, TIBC, IRON, RETICCTPCT in the last 72 hours.   Device Interrogation: Thx to [facility] inappropriate therapy confirmed  2/ feature AV interval delta.  Will inactivate as the pt has VA block in VT so discrimination should be otherwise maintained   ASSESSMENT AND PLAN:  Principal Problem:   Symptomatic anemia Active Problems:   Paroxysmal ventricular tachycardia   Chronic systolic heart failure   Cardiomyopathy, ischemic   Biventricular implantable cardioverter-defibrillator in situ   Chronic anticoagulation   HTN (hypertension)   Anxiety   Acute renal insufficiency   Clostridium difficile colitis   Bilateral inguinal hernia (BIH) s/p lap repair w mesh 06/20/2014   Hematoma of abdominal wall TEl>> device therapy but no more sustained VT Continue sotalol loadying  From my perspective home TOMORROW is the earliest Also will stop LMWH Increase carvedilol >>6.25 and anticipate resumption of losartan as outpt as BP permits conitnue coumadin Signed, Virl Axe MD  07/03/2014

## 2014-07-03 NOTE — Progress Notes (Signed)
[facility]  [address] [place], [place] [address] Phone: [phone] FAX: [place] [phone] 1940-08-03  CARE TEAM:  PCP: Sheela Stack, MD  Outpatient Care Team: Patient Care Team: Sheela Stack, MD as PCP - General (Endocrinology) Darlin Coco, MD as Consulting Physician (Cardiology) Deboraha Sprang, MD as Consulting Physician (Cardiology) Serafina Mitchell, MD as Consulting Physician (Vascular Surgery) Michael Boston, MD as Consulting Physician (General Surgery)  Inpatient Treatment Team: Treatment Team: Attending Provider: Michael Boston, MD; Consulting Physician: Michae Kava Lbcardiology, MD; Technician: Francene Finders, NT; Registered Nurse: Suzan Nailer, RN; Registered Nurse: Wendall Mola, RN; Registered Nurse: Micki Riley, RN; Registered Nurse: Cliffton Asters, RN; Registered Nurse: Loura Back, RN   Subjective:  Runs of VT - no mjr sx - transferred to stepdown unit No new cardiac events Episodes of spitting up with sharp cough/throat irritation.  Did not keep much down yesterday - better this AM BM soft & formed w flatus Feeling better - pt & wife hopeful Wife in room   Objective:  Vital signs:  Filed Vitals:   07/03/14 0500 07/03/14 0600 07/03/14 0700 07/03/14 0753  BP:  120/95  116/60  Pulse: 74 85 73   Temp:    98.6 F (37 C)  TempSrc:    Oral  Resp: '16 16 14   ' Height:      Weight:      SpO2: 99% 100% 99%     Last BM Date: 07/02/14  Intake/Output   Yesterday:  02/02 0701 - 02/03 0700 In: 1550 [P.O.:450; IV Piggyback:1100] Out: 575 [Urine:575] This shift:     Bowel function:  Flatus: y  BM: yes - more formed  Drain: thin bile  Physical Exam:  General: Pt awake/alert/oriented x4 in no acute distress.  Eyes: PERRL, normal EOM.  Sclera clear.  No icterus Neuro: CN II-XII intact w/o focal sensory/motor deficits. Lymph: No head/neck/groin  lymphadenopathy Psych:  No delerium/psychosis/paranoia.  More alert.  Not confused HENT: Normocephalic, Mucus membranes moist.  No thrush Neck: Supple, No tracheal deviation Chest: No chest wall pain w good excursion CV:  Pulses intact.  Regular rhythm MS: Normal AROM mjr joints.  No obvious deformity Abdomen: Soft.  Nondistended.  Nontender at incisions.  Large ecchymosis in flank but soft.  No evidence of peritonitis.  No incarcerated hernias. GU:  NEMG.  Ecchymosis but min edema.  Small right groin 3cm mass c/w seroma/hematoma  Ext:  SCDs BLE.  No edema.  No cyanosis Skin: No petechiae / purpura   Problem List:   Principal Problem:   Symptomatic anemia Active Problems:   Cardiomyopathy, ischemic   Chronic systolic heart failure   Chronic anticoagulation   Biventricular implantable cardioverter-defibrillator in situ   HTN (hypertension)   Anxiety   Acute renal insufficiency   Clostridium difficile colitis   Bilateral inguinal hernia (BIH) s/p lap repair w mesh 06/20/2014   Hematoma of abdominal wall   Assessment  [name]  74 y.o. male       OK but many issues  Plan:  Palliated throat irritation better.  Make Magic mouthwash scheduled.  Switch to Reglan for better gastric emptying.  Zofran when necessary.  Hold Phenergan for last since seems to be sedating.  AXR to r/o ileus, etc  -PO Vancomycin for C diff x 2weeks - still very odd to get Cdiff w only one periop dose of ABx but probable  indicator of fragility  -keep dry.  Cr stable/improving even w  ----I&O.  IV to medlock w PRN IVF.  Cardiology can adjust  -no issues on telemetry.  Hopefully back on most cardiac meds soon  -Full anticoag while inpatient.  Full dose lovenox until INR > 2 & PO warfarin.   Hgb stable.  Ask pharmacy to adjust since recent ARF & Cr still slightly elevated though improving.  NO SHOTS IN ABDOMINAL WALL!!!! I worry it will encourage bleeding again  -follow Hgb closely - transfuse  if worsens.  Reverse if worsens.  No issues = doubt active bleed.    -iron for anemia.  -Anxiolysis/insomnia.  IV lorazepam vs PO Xanax, depending on bowel fx.  Benadryl for backup - they feel IV works better  -VTE prophylaxis- SCDs, etc  -mobilize as tolerated to help recovery.  Get PT/OT involved  I updated the patient's status to the patient, his wife, and his RN.  Recommendations were made.  Questions were answered.  They expressed understanding & appreciation.  [facility] to follow until I return in 36hrs.   [name], M.D., F.A.C.S. Gastrointestinal and Minimally Invasive Surgery [facility] [address] [place], [place] [address] [phone] Main / Paging   07/03/2014   Results:   Labs: Results for orders placed or performed during the hospital encounter of 06/27/14 (from the past 48 hour(s))  CBC     Status: Abnormal   Collection Time: 07/02/14  3:42 AM  Result Value Ref Range   WBC 10.4 4.0 - 10.5 K/uL   RBC 3.25 (L) 4.22 - 5.81 MIL/uL   Hemoglobin 10.5 (L) 13.0 - 17.0 g/dL   HCT 32.7 (L) 39.0 - 52.0 %   MCV 100.6 (H) 78.0 - 100.0 fL   MCH 32.3 26.0 - 34.0 pg   MCHC 32.1 30.0 - 36.0 g/dL   RDW 16.5 (H) 11.5 - 15.5 %   Platelets 458 (H) 150 - 400 K/uL  Basic metabolic panel     Status: Abnormal   Collection Time: 07/02/14  3:42 AM  Result Value Ref Range   Sodium 139 135 - 145 mmol/L   Potassium 3.7 3.5 - 5.1 mmol/L   Chloride 101 96 - 112 mmol/L   CO2 29 19 - 32 mmol/L   Glucose, Bld 165 (H) 70 - 99 mg/dL   BUN 24 (H) 6 - 23 mg/dL   Creatinine, Ser 1.33 0.50 - 1.35 mg/dL   Calcium 8.9 8.4 - 10.5 mg/dL   GFR calc non Af Amer 52 (L) >90 mL/min   GFR calc Af Amer 60 (L) >90 mL/min    Comment: (NOTE) The eGFR has been calculated using the CKD EPI equation. This calculation has not been validated in all clinical situations. eGFR's persistently <90 mL/min signify possible Chronic Kidney Disease.    Anion gap 9  5 - 15  CBC     Status: Abnormal   Collection Time: 07/03/14  3:54 AM  Result Value Ref Range   WBC 13.6 (H) 4.0 - 10.5 K/uL   RBC 3.35 (L) 4.22 - 5.81 MIL/uL   Hemoglobin 10.5 (L) 13.0 - 17.0 g/dL   HCT 33.8 (L) 39.0 - 52.0 %   MCV 100.9 (H) 78.0 - 100.0 fL   MCH 31.3 26.0 - 34.0 pg   MCHC 31.1 30.0 - 36.0 g/dL   RDW 16.3 (H) 11.5 - 15.5 %   Platelets 510 (H) 150 - 400 K/uL  Basic  metabolic panel     Status: Abnormal   Collection Time: 07/03/14  3:54 AM  Result Value Ref Range   Sodium 143 135 - 145 mmol/L   Potassium 3.9 3.5 - 5.1 mmol/L   Chloride 102 96 - 112 mmol/L   CO2 37 (H) 19 - 32 mmol/L   Glucose, Bld 133 (H) 70 - 99 mg/dL   BUN 20 6 - 23 mg/dL   Creatinine, Ser 1.27 0.50 - 1.35 mg/dL   Calcium 8.9 8.4 - 10.5 mg/dL   GFR calc non Af Amer 55 (L) >90 mL/min   GFR calc Af Amer 63 (L) >90 mL/min    Comment: (NOTE) The eGFR has been calculated using the CKD EPI equation. This calculation has not been validated in all clinical situations. eGFR's persistently <90 mL/min signify possible Chronic Kidney Disease.    Anion gap 4 (L) 5 - 15  Protime-INR     Status: Abnormal   Collection Time: 07/03/14  3:54 AM  Result Value Ref Range   Prothrombin Time 21.7 (H) 11.6 - 15.2 seconds   INR 1.87 (H) 0.00 - 1.49    Imaging / Studies: No results found.  Medications / Allergies: per chart  Antibiotics: Anti-infectives    Start     Dose/Rate Route Frequency Ordered Stop   07/01/14 1000  vancomycin (VANCOCIN) 50 mg/mL oral solution 125 mg     125 mg Oral 4 times daily 07/01/14 0810 07/15/14 0959   06/30/14 0800  metroNIDAZOLE (FLAGYL) IVPB 500 mg  Status:  Discontinued     500 mg100 mL/hr over 60 Minutes Intravenous Every 6 hours 06/30/14 0706 07/02/14 0830   06/30/14 0715  vancomycin (VANCOCIN) 50 mg/mL oral solution 125 mg  Status:  Discontinued     125 mg Oral 4 times per day 06/30/14 5643 06/30/14 0934       Note: Portions of this report may have been transcribed  using voice recognition software. Every effort was made to ensure accuracy; however, inadvertent computerized transcription errors may be present.   Any transcriptional errors that result from this process are unintentional.

## 2014-07-04 DIAGNOSIS — I4891 Unspecified atrial fibrillation: Secondary | ICD-10-CM

## 2014-07-04 DIAGNOSIS — I4892 Unspecified atrial flutter: Secondary | ICD-10-CM

## 2014-07-04 LAB — BASIC METABOLIC PANEL
Anion gap: 8 (ref 5–15)
BUN: 25 mg/dL — ABNORMAL HIGH (ref 6–23)
CALCIUM: 8.8 mg/dL (ref 8.4–10.5)
CO2: 27 mmol/L (ref 19–32)
CREATININE: 1.4 mg/dL — AB (ref 0.50–1.35)
Chloride: 104 mmol/L (ref 96–112)
GFR, EST AFRICAN AMERICAN: 56 mL/min — AB (ref >90)
GFR, EST NON AFRICAN AMERICAN: 49 mL/min — AB (ref >90)
Glucose, Bld: 120 mg/dL — ABNORMAL HIGH (ref 70–99)
POTASSIUM: 3.7 mmol/L (ref 3.5–5.1)
Sodium: 139 mmol/L (ref 135–145)

## 2014-07-04 LAB — CBC
HCT: 33.2 % — ABNORMAL LOW (ref 39.0–52.0)
HEMOGLOBIN: 10.2 g/dL — AB (ref 13.0–17.0)
MCH: 31 pg (ref 26.0–34.0)
MCHC: 30.7 g/dL (ref 30.0–36.0)
MCV: 100.9 fL — AB (ref 78.0–100.0)
PLATELETS: 495 10*3/uL — AB (ref 150–400)
RBC: 3.29 MIL/uL — AB (ref 4.22–5.81)
RDW: 16.3 % — ABNORMAL HIGH (ref 11.5–15.5)
WBC: 11.4 10*3/uL — ABNORMAL HIGH (ref 4.0–10.5)

## 2014-07-04 LAB — PROTIME-INR
INR: 2.3 — AB (ref 0.00–1.49)
Prothrombin Time: 25.5 seconds — ABNORMAL HIGH (ref 11.6–15.2)

## 2014-07-04 MED ORDER — WARFARIN SODIUM 2.5 MG PO TABS
2.5000 mg | ORAL_TABLET | Freq: Once | ORAL | Status: AC
  Administered 2014-07-04: 2.5 mg via ORAL
  Filled 2014-07-04: qty 1

## 2014-07-04 NOTE — Progress Notes (Signed)
  Subjective: Formed BM this AM, up in chair and walked already. AICD tech just came and interrogated the device for Dr. Caryl Comes.  Objective: Vital signs in last 24 hours: Temp:  [97.6 F (36.4 C)-98.7 F (37.1 C)] 97.6 F (36.4 C) (02/04 0545) Pulse Rate:  [86-126] 100 (02/04 0554) Resp:  [18-20] 20 (02/03 2135) BP: (98-134)/(50-86) 101/72 mmHg (02/04 0554) SpO2:  [75 %-100 %] 85 % (02/04 0554) Last BM Date: 07/03/14  Intake/Output from previous day: 02/03 0701 - 02/04 0700 In: 860 [P.O.:860] Out: 500 [Urine:500] Intake/Output this shift:    General appearance: alert and cooperative Resp: clear to auscultation bilaterally Cardio: regular with some ectopy GI: soft, large evolving hematoma and ecchymosis B lower ABD, NT, +BS  Lab Results:   Recent Labs  07/03/14 0354 07/04/14 0700  WBC 13.6* 11.4*  HGB 10.5* 10.2*  HCT 33.8* 33.2*  PLT 510* 495*   BMET  Recent Labs  07/03/14 0354 07/04/14 0700  NA 143 139  K 3.9 3.7  CL 102 104  CO2 37* 27  GLUCOSE 133* 120*  BUN 20 25*  CREATININE 1.27 1.40*  CALCIUM 8.9 8.8   PT/INR  Recent Labs  07/03/14 0354 07/04/14 0700  LABPROT 21.7* 25.5*  INR 1.87* 2.30*   ABG No results for input(s): PHART, HCO3 in the last 72 hours.  Invalid input(s): PCO2, PO2  Studies/Results: Dg Abd Acute W/chest  07/03/2014   CLINICAL DATA:  Emesis, abdominal tenderness, shortness of breath  EXAM: ACUTE ABDOMEN SERIES (ABDOMEN 2 VIEW & CHEST 1 VIEW)  COMPARISON:  July 01, 2014  FINDINGS: There are multiple air-filled dilated small bowel loops. There is no free air. Air is identified in the colon. Aortic stent is identified. Cardiac pacemaker is noted. The lungs are clear. The aorta is tortuous. The heart size normal. Minor scar of left lung base is noted.  IMPRESSION: Multiple air-filled dilated small bowel loops. Air is identified in the colon. The findings can be due to small bowel ileus or early small bowel obstruction. Followup  is recommended.  No acute abnormality identified within the chest.   Electronically Signed   By: Abelardo Diesel M.D.   On: 07/03/2014 14:03    Anti-infectives: Anti-infectives    Start     Dose/Rate Route Frequency Ordered Stop   07/01/14 1000  vancomycin (VANCOCIN) 50 mg/mL oral solution 125 mg     125 mg Oral 4 times daily 07/01/14 0810 07/15/14 0959   06/30/14 0800  metroNIDAZOLE (FLAGYL) IVPB 500 mg  Status:  Discontinued     500 mg100 mL/hr over 60 Minutes Intravenous Every 6 hours 06/30/14 0706 07/02/14 0830   06/30/14 0715  vancomycin (VANCOCIN) 50 mg/mL oral solution 125 mg  Status:  Discontinued     125 mg Oral 4 times per day 06/30/14 0706 06/30/14 0934      Assessment/Plan: S/P lap BIH with post op hematoma ABL anemia - stable C diff - oral vanco for 2 weeks PVT/AICD in place - per cardiology, Dr. Caryl Comes had the AICD interrogated Anticoag - INR 2.3 this AM, pharmacy dosing coumadin VTE - above FEN - tol D1 diet DIspo - hope for D/C 2/5 I spoke with his wife as well  LOS: 7 days    [name] 07/04/2014

## 2014-07-04 NOTE — Progress Notes (Signed)
OT Cancellation Note  Patient Details Name: [name] MRN: [id number] DOB: 10-Aug-1940   Cancelled Treatment:    Reason Eval/Treat Not Completed: OT screened, no needs identified, will sign off. Pt reports no major changes since OT eval on Sunday. When OT entered room, pt ambulating from bathroom with family supervision and no balance deficits noted. Discussed role of OT and pt states that he does not feel that he needs it at this time. Acute OT to sign off at this time, thank you for this referral.   Su Ley, OTR/L Occupational Therapist [phone] (pager)  07/04/2014, 10:57 AM

## 2014-07-04 NOTE — Progress Notes (Signed)
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   He presented with VT storm in September of 2012 and underwent ablation at that time. He had recurrent VT post ablation and was transferred to [facility] where he underwent repeat ablation complicated by pericardial perforation and subsequent pericarditis. He was treated with Sotalol and Mexilitine was also added due to recurrent VT. He then had recurrent slow VT at 110-130 bpm and underwent repeat ablation in March of 2013. He has had recurrent  VT without shocks but with ATP  He was admitted for B inguinal hernia repair done laproscopically but was readmitted for postoperative anemia occurring in the context of perioperative LMWH bridging Rx by Dr TB  Hbb 7.5;  This was further complicated by acute renal failure which prompted the holding of his sotalol\  Renal function as now recovered and mexiletine  And   sotalol  Resumed  the latter yesterday  ,  Weak and nauseated  Trying to eat but still vomiting   Past Medical History  Diagnosis Date  . CAD (coronary artery disease) 1999    Remote anterolater and apical MI, cath 2011 patent stents RCA/LCx  LAD w/o obstruction  . Cardiomyopathy, ischemic     EF 20-25%  echo 2012  . Chronic systolic congestive heart failure   . ICD (implantable cardiac defibrillator) in place     [place] Jude  . 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 Abdominal aortic aneurysm 2009    Repaired with endovascular stent  . PVD (peripheral vascular disease)     Has occluded innominate vein  . Myocardial infarction   . Hypertension   . Ventricular tachycardia     recurrent slow VT at 100-110 bpm,  Rx mexilitene/sotalol  . Left bundle branch  block   . Systolic CHF, chronic   . Arthritis   . Gynecomastia     2/2 spironolactone  . Atrial fibrillation   . Pericarditis     due to perforation  . Inguinal hernia recurrent bilateral   . GERD (gastroesophageal reflux disease)   . Anemia 05/2014  . Cardiac arrest 12/05/2013  . Acute on chronic systolic heart failure 09/30/7480  . 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 SVT (supraventricular tachycardia) 12/05/2013    Scheduled Meds:  Scheduled Meds: . ALPRAZolam  0.125 mg Oral 4 times per day  . carvedilol  3.125 mg Oral BID WC  . levothyroxine  132 mcg Oral Once per day on Fri  . levothyroxine  88 mcg Oral Once per day on Sun Mon Tue Wed Thu Sat  . lip balm   Topical BID  . loratadine  10 mg Oral Daily  . magic mouthwash  15 mL Oral QID  . metoCLOPramide (REGLAN) injection  5 mg Intravenous 4 times per day  . mexiletine  150 mg Oral 3 times per day  . pantoprazole  40 mg Oral BID AC  . rosuvastatin  5 mg Oral Once per day on Tue Sat  . saccharomyces boulardii  250 mg Oral BID  . sodium chloride  3 mL Intravenous Q12H  . sodium chloride  3 mL Intravenous Q12H  . sotalol  80 mg Oral  Q12H  . vancomycin  125 mg Oral QID  . warfarin  2.5 mg Oral ONCE-1800  . Warfarin - Pharmacist Dosing Inpatient   Does not apply q1800       PHYSICAL EXAM Filed Vitals:   07/04/14 0550 07/04/14 0554 07/04/14 0907 07/04/14 1322  BP: 102/51 101/72 93/76 102/65  Pulse:  100    Temp:    98.1 F (36.7 C)  TempSrc:    Oral  Resp:    16  Height:      Weight:      SpO2: 75% 85%  100%    General appearance: alert, cachectic and mild distress Neck: JVD - 7 cm above sternal notch Lungs: tubular sounds bilaterally Heart: regular rate and rhythm Abdomen: soft, non-tender; bowel sounds normal; no masses,  no organomegaly Extremities: edema none Skin: Skin color, texture, turgor normal. No rashes or lesions Neurologic: Grossly normal  TELEMETRY:  Reviewed telemetry pt in sinus with pac and appears to have inappropriate therapy in sinus tach    Intake/Output Summary (Last 24 hours) at 07/04/14 1813 Last data filed at 07/04/14 1323  Gross per 24 hour  Intake    460 ml  Output    400 ml  Net     60 ml    LABS: Basic Metabolic Panel:  Recent Labs Lab 06/28/14 0335 06/29/14 0430 06/30/14 0544 07/01/14 0531 07/02/14 0342 07/03/14 0354 07/04/14 0700  NA 129* 131* 136 141 139 143 139  K 4.9 4.9 4.6 4.3 3.7 3.9 3.7  CL 94* 93* 99 100 101 102 104  CO2 28 31 31  33* 29 37* 27  GLUCOSE 114* 150* 130* 131* 165* 133* 120*  BUN 64* 48* 36* 32* 24* 20 25*  CREATININE 3.50* 2.21* 1.56* 1.51* 1.33 1.27 1.40*  CALCIUM 8.9 9.6 8.9 9.3 8.9 8.9 8.8   Cardiac Enzymes: No results for input(s): CKTOTAL, CKMB, CKMBINDEX, TROPONINI in the last 72 hours. CBC:  Recent Labs Lab 06/28/14 0335  06/29/14 0430 06/29/14 1038 06/30/14 0544 07/01/14 0531 07/02/14 0342 07/03/14 0354 07/04/14 0700  WBC 16.5*  --   --  14.5* 9.1  --  10.4 13.6* 11.4*  HGB 8.1*  < > 9.8* 9.8* 8.6* 9.7* 10.5* 10.5* 10.2*  HCT 23.2*  --   --  29.2* 26.4*  --  32.7* 33.8* 33.2*  MCV 91.7  --   --  97.3 97.8  --  100.6* 100.9* 100.9*  PLT 265  --   --  364 359  --  458* 510* 495*  < > = values in this interval not displayed. PROTIME:  Recent Labs  07/03/14 0354 07/04/14 0700  LABPROT 21.7* 25.5*  INR 1.87* 2.30*   Liver Function Tests: No results for input(s): AST, ALT, ALKPHOS, BILITOT, PROT, ALBUMIN in the last 72 hours. No results for input(s): LIPASE, AMYLASE in the last 72 hours. BNP: BNP (last 3 results)  Recent Labs  06/15/14 0139 06/29/14 1038  BNP 1001.8* 1486.2*    ProBNP (last 3 results)  Recent Labs  12/05/13 0047 01/17/14 2153  PROBNP 2438.0* 3228.0*    D-Dimer: No results for input(s): DDIMER in the last 72 hours. Hemoglobin A1C: No results for input(s): HGBA1C in the last 72 hours. Fasting Lipid Panel: No results for  input(s): CHOL, HDL, LDLCALC, TRIG, CHOLHDL, LDLDIRECT in the last 72 hours. Thyroid Function Tests: No results for input(s): TSH, T4TOTAL, T3FREE, THYROIDAB in the last 72 hours.  Invalid input(s): FREET3 Anemia Panel: No results for input(s):  VITAMINB12, FOLATE, FERRITIN, TIBC, IRON, RETICCTPCT in the last 72 hours.   Device Interrogation: Thx to [facility] Jude inappropriate therapy confirmed 2/ feature AV interval delta.  Will inactivate as the pt has VA block in VT so discrimination should be otherwise maintained   ASSESSMENT AND PLAN:  Principal Problem:   Symptomatic anemia Active Problems:   Paroxysmal ventricular tachycardia   Chronic systolic heart failure   Cardiomyopathy, ischemic   Biventricular implantable cardioverter-defibrillator in situ   Chronic anticoagulation   HTN (hypertension)   Anxiety   Acute renal insufficiency   Clostridium difficile colitis   Bilateral inguinal hernia (BIH) s/p lap repair w mesh 06/20/2014   Hematoma of abdominal wall   Atrial fibrillation and flutter TEl>> device therapy but no more sustained VT Continue sotalol loadying   device interrogation clarified that what I thought was VT was in fact atrial flutter with 2 to one antegrade conduction. This is relatively new and will not likely be well-tolerated. He is now therapeutic on his Coumadin. If he does not revert to sinus rhythm, will anticipate cardioversion probably tomorrow.   Also will stop LMWH Increase carvedilol >>6.25 conitnue coumadin Signed, Virl Axe MD  07/04/2014

## 2014-07-04 NOTE — Progress Notes (Signed)
ANTICOAGULATION CONSULT NOTE - Follow Up Consult  Pharmacy Consult for warfarin Indication: intermittent VT  Allergies  Allergen Reactions  . Iohexol Hives and Itching     Code: HIVES, Desc: hives w/ itching during cardiac cath '99, mult caths since w/ ? premeds, DR.G.HAYES REQUESTS 13 HR PRE MED//A.C.pt okay w/13 hr prep/mms, Onset Date: [id number]   . Prednisone Hives    Patient Measurements: Height: 5\' 8"  (172.7 cm) Weight: 167 lb 8.8 oz (76 kg) IBW/kg (Calculated) : 68.4  Vital Signs: Temp: 98.1 F (36.7 C) (02/04 1322) Temp Source: Oral (02/04 1322) BP: 102/65 mmHg (02/04 1322) Pulse Rate: 100 (02/04 0554)  Labs:  Recent Labs  07/02/14 0342 07/03/14 0354 07/04/14 0700  HGB 10.5* 10.5* 10.2*  HCT 32.7* 33.8* 33.2*  PLT 458* 510* 495*  LABPROT  --  21.7* 25.5*  INR  --  1.87* 2.30*  CREATININE 1.33 1.27 1.40*    Estimated Creatinine Clearance: 46.1 mL/min (by C-G formula based on Cr of 1.4).   Assessment: 74 yo M s/p bilateral inguinal hernia repair via lap w/ mesh 6/78/93 complicated by hematoma of abdominal wall. He is on warfarin for intermittent VT and this was resumed 2/2. He is off Flagyl and on vancomycin PO for C diff treatment. No bleeding noted, CBC is stable. INR is therapeutic at 2.3.  PTA: 2.5 mg daily except 3.75 mg on Fridays, last dose at home 1/27.Was previously on LMWH bridge 80 mg sq q12h.  Goal of Therapy:  INR 2-3 Monitor platelets by anticoagulation protocol: Yes   Plan:  - Warfarin 2.5 mg PO tonight - INR daily  [facility], Pharm.D., BCPS Clinical Pharmacist Pager: [phone] 07/04/2014 1:31 PM

## 2014-07-04 NOTE — Progress Notes (Signed)
Physical Therapy Treatment Patient Details Name: [name] MRN: [id number] DOB: [birth year] Today's Date: 07/04/2014    History of Present Illness Pt adm with symptomatic anemia due to abdominal bleeding after hernia repair on 1/21. Pt s/p NG tube placement. PMH - CHF, VT, CAD    PT Comments    Pt is at or approaching baseline functioning.  No further PT needs.  Will sign off.  Follow Up Recommendations  No PT follow up     Equipment Recommendations  None recommended by PT    Recommendations for Other Services       Precautions / Restrictions Precautions Precautions: Fall    Mobility  Bed Mobility   Bed Mobility: Supine to Sit;Sit to Supine     Supine to sit: Modified independent (Device/Increase time) Sit to supine: Modified independent (Device/Increase time)   General bed mobility comments: moderate use of rails  Transfers Overall transfer level: Modified independent   Transfers: Sit to/from [facility] Sit to Stand: Modified independent (Device/Increase time) Stand pivot transfers: Modified independent (Device/Increase time)          Ambulation/Gait Ambulation/Gait assistance: Modified independent (Device/Increase time) Ambulation Distance (Feet): 400 Feet   Gait Pattern/deviations: WFL(Within Functional Limits)   Gait velocity interpretation: at or above normal speed for age/gender General Gait Details: steady   Stairs Stairs: Yes Stairs assistance: Modified independent (Device/Increase time) Stair Management: One rail Right;Alternating pattern;Forwards Number of Stairs: 5 General stair comments: safe with/without rails  Wheelchair Mobility    Modified Rankin (Stroke Patients Only)       Balance Overall balance assessment: No apparent balance deficits (not formally assessed)   Sitting balance-Leahy Scale: Normal       Standing balance-Leahy Scale: Good                      Cognition  Arousal/Alertness: Awake/alert Behavior During Therapy: WFL for tasks assessed/performed Overall Cognitive Status: Within Functional Limits for tasks assessed                      Exercises      General Comments        Pertinent Vitals/Pain Pain Assessment: Faces Faces Pain Scale: Hurts little more Pain Location: abdomen Pain Descriptors / Indicators: Sore    Home Living                      Prior Function            PT Goals (current goals can now be found in the care plan section) Acute Rehab PT Goals Patient Stated Goal: return home PT Goal Formulation: With patient/family Time For Goal Achievement: 07/05/14 Potential to Achieve Goals: Good Progress towards PT goals: Progressing toward goals    Frequency  Min 3X/week    PT Plan Current plan remains appropriate    Co-evaluation             End of Session   Activity Tolerance: Patient tolerated treatment well Patient left: in bed;with call bell/phone within reach;with bed alarm set;with family/visitor present     Time: 4944-9675 PT Time Calculation (min) (ACUTE ONLY): 17 min  Charges:                       G Codes:      Jeanice Dempsey, Tessie Fass 07/04/2014, 12:24 PM 07/04/2014  Donnella Sham, PT [phone]  (pager)

## 2014-07-05 ENCOUNTER — Inpatient Hospital Stay (HOSPITAL_COMMUNITY): Payer: Medicare Other | Admitting: Certified Registered Nurse Anesthetist

## 2014-07-05 ENCOUNTER — Encounter (HOSPITAL_COMMUNITY)
Admission: AD | Disposition: A | Discharge status: Home / Self Care | Payer: Self-pay | Source: Ambulatory Visit | Attending: Surgery

## 2014-07-05 ENCOUNTER — Encounter (HOSPITAL_COMMUNITY): Payer: Self-pay | Admitting: *Deleted

## 2014-07-05 DIAGNOSIS — I4892 Unspecified atrial flutter: Secondary | ICD-10-CM

## 2014-07-05 HISTORY — PX: CARDIOVERSION: SHX1299

## 2014-07-05 LAB — CBC
HCT: 30.8 % — ABNORMAL LOW (ref 39.0–52.0)
Hemoglobin: 9.7 g/dL — ABNORMAL LOW (ref 13.0–17.0)
MCH: 31.5 pg (ref 26.0–34.0)
MCHC: 31.5 g/dL (ref 30.0–36.0)
MCV: 100 fL (ref 78.0–100.0)
Platelets: 516 10*3/uL — ABNORMAL HIGH (ref 150–400)
RBC: 3.08 MIL/uL — AB (ref 4.22–5.81)
RDW: 16.2 % — ABNORMAL HIGH (ref 11.5–15.5)
WBC: 10 10*3/uL (ref 4.0–10.5)

## 2014-07-05 LAB — PROTIME-INR
INR: 2.76 — ABNORMAL HIGH (ref 0.00–1.49)
Prothrombin Time: 29.4 seconds — ABNORMAL HIGH (ref 11.6–15.2)

## 2014-07-05 LAB — BASIC METABOLIC PANEL
Anion gap: 5 (ref 5–15)
BUN: 20 mg/dL (ref 6–23)
CHLORIDE: 104 mmol/L (ref 96–112)
CO2: 31 mmol/L (ref 19–32)
Calcium: 8.3 mg/dL — ABNORMAL LOW (ref 8.4–10.5)
Creatinine, Ser: 1.25 mg/dL (ref 0.50–1.35)
GFR, EST AFRICAN AMERICAN: 65 mL/min — AB (ref >90)
GFR, EST NON AFRICAN AMERICAN: 56 mL/min — AB (ref >90)
Glucose, Bld: 108 mg/dL — ABNORMAL HIGH (ref 70–99)
POTASSIUM: 3.7 mmol/L (ref 3.5–5.1)
Sodium: 140 mmol/L (ref 135–145)

## 2014-07-05 SURGERY — CARDIOVERSION
Anesthesia: Monitor Anesthesia Care

## 2014-07-05 MED ORDER — PROPOFOL 10 MG/ML IV BOLUS
INTRAVENOUS | Status: DC | PRN
  Administered 2014-07-05: 40 mg via INTRAVENOUS

## 2014-07-05 MED ORDER — SODIUM CHLORIDE 0.9 % IV SOLN
INTRAVENOUS | Status: DC | PRN
  Administered 2014-07-05: 12:00:00 via INTRAVENOUS

## 2014-07-05 MED ORDER — WARFARIN SODIUM 2.5 MG PO TABS
2.5000 mg | ORAL_TABLET | Freq: Once | ORAL | Status: DC
  Filled 2014-07-05: qty 1

## 2014-07-05 MED ORDER — SODIUM CHLORIDE 0.9 % IV SOLN
INTRAVENOUS | Status: DC
  Administered 2014-07-05: 09:00:00 via INTRAVENOUS

## 2014-07-05 MED ORDER — VANCOMYCIN 50 MG/ML ORAL SOLUTION: 125.0000 mg | Freq: Four times a day (QID) | ORAL | Status: DC

## 2014-07-05 MED ORDER — WARFARIN SODIUM 1 MG PO TABS
1.0000 mg | ORAL_TABLET | Freq: Once | ORAL | Status: DC
  Filled 2014-07-05: qty 1

## 2014-07-05 NOTE — Progress Notes (Signed)
ANTICOAGULATION CONSULT NOTE - Follow Up Consult  Pharmacy Consult for warfarin Indication: intermittent VT  Allergies  Allergen Reactions  . Iohexol Hives and Itching     Code: HIVES, Desc: hives w/ itching during cardiac cath '99, mult caths since w/ ? premeds, DR.G.HAYES REQUESTS 13 HR PRE MED//A.C.pt okay w/13 hr prep/mms, Onset Date: [id number]   . Prednisone Hives    Patient Measurements: Height: 5\' 8"  (172.7 cm) Weight: 167 lb 8.8 oz (76 kg) IBW/kg (Calculated) : 68.4  Vital Signs: Temp: 98.4 F (36.9 C) (02/05 0533) Temp Source: Oral (02/05 0533) BP: 107/53 mmHg (02/05 0533) Pulse Rate: 94 (02/05 0533)  Labs:  Recent Labs  07/03/14 0354 07/04/14 0700 07/05/14 0620  HGB 10.5* 10.2* 9.7*  HCT 33.8* 33.2* 30.8*  PLT 510* 495* 516*  LABPROT 21.7* 25.5* 29.4*  INR 1.87* 2.30* 2.76*  CREATININE 1.27 1.40* 1.25    Estimated Creatinine Clearance: 51.7 mL/min (by C-G formula based on Cr of 1.25).   Assessment: 74 yo M s/p bilateral inguinal hernia repair via lap w/ mesh 1/95/97 complicated by hematoma of abdominal wall. He is on warfarin for intermittent VT and this was resumed 2/2. He is off Flagyl and on vancomycin PO for C diff treatment. No bleeding noted, CBC is stable. INR is therapeutic at 2.76 with fairly large increase from yesterday  Goal of Therapy:  INR 2-3 Monitor platelets by anticoagulation protocol: Yes   Plan:  - Warfarin 1 mg PO tonight - INR daily  Thanks for allowing pharmacy to be a part of this patient's care.  Excell Seltzer, PharmD Clinical Pharmacist, [phone]

## 2014-07-05 NOTE — Progress Notes (Signed)
[facility]  [address]  [place], [place] [address] Phone: [phone] FAX: [phone]    [name] [id number] 02/14/1941  CARE TEAM:  PCP: SOUTH,STEPHEN ALAN, MD  Outpatient Care Team: Patient Care Team: Stephen Alan South, MD as PCP - General (Endocrinology) Thomas Brackbill, MD as Consulting Physician (Cardiology)  C Klein, MD as Consulting Physician (Cardiology) Vance W Brabham, MD as Consulting Physician (Vascular Surgery)  , MD as Consulting Physician (General Surgery)  Inpatient Treatment Team: Treatment Team: Attending Provider:  , MD; Consulting Physician: Rounding Lbcardiology, MD; Technician: Katrina L Davis, NT; Registered Nurse: Tara S Cross, RN; Registered Nurse: Taylor D Cook, RN; Registered Nurse: Troyce Ann Hood, RN; Consulting Physician: Md Ccs, MD; Technician: Imari I Watkins, NT; Technician: Tayla W Edwards, NT; Registered Nurse: Trimaine Julia Butler, RN   Subjective:  No new cardiac events Eating better BM soft & formed w flatus Walking around in room Feeling better - pt & wife hopeful Chatty, relaxed  Objective:  Vital signs:  Filed Vitals:   07/04/14 0907 07/04/14 1322 07/04/14 2119 07/05/14 0533  BP: 93/76 102/65 112/57 107/53  Pulse:    94  Temp:  98.1 F (36.7 C) 98.6 F (37 C) 98.4 F (36.9 C)  TempSrc:  Oral Oral Oral  Resp:  16 16 18  Height:      Weight:      SpO2:  100% 91% 98%    Last BM Date: 07/04/14  Intake/Output   Yesterday:  02/04 0701 - 02/05 0700 In: 280 [P.O.:280] Out: 200 [Urine:200] This shift:  Total I/O In: 120 [P.O.:120] Out: 200 [Urine:200]  Bowel function:  Flatus: y  BM: yes - more formed  Drain: thin bile  Physical Exam:  General: Pt awake/alert/oriented x4 in no acute distress.  Eyes: PERRL, normal EOM.  Sclera clear.  No icterus Neuro: CN II-XII intact w/o focal sensory/motor deficits. Lymph: No  head/neck/groin lymphadenopathy Psych:  No delerium/psychosis/paranoia.  More alert.  Not confused HENT: Normocephalic, Mucus membranes moist.  No thrush Neck: Supple, No tracheal deviation Chest: No chest wall pain w good excursion CV:  Pulses intact.  Regular rhythm MS: Normal AROM mjr joints.  No obvious deformity Abdomen: Soft.  Nondistended.  Nontender at incisions.  Large ecchymosis in flank but much softer.  No evidence of peritonitis.  No incarcerated hernias. GU:  NEMG.  Ecchymosis but min edema.  Small right groin 3cm mass c/w seroma/hematoma  Ext:  SCDs BLE.  No edema.  No cyanosis Skin: No petechiae / purpura   Problem List:   Principal Problem:   Symptomatic anemia Active Problems:   Cardiomyopathy, ischemic   Chronic systolic heart failure   Chronic anticoagulation   Biventricular implantable cardioverter-defibrillator in situ   HTN (hypertension)   Anxiety   Acute renal insufficiency   Clostridium difficile colitis   Bilateral inguinal hernia (BIH) s/p lap repair w mesh 06/20/2014   Hematoma of abdominal wall   Paroxysmal ventricular tachycardia   Atrial fibrillation and flutter   Assessment  [name]  73 y.o. male       IMPROVED  Plan:  -Cardiac investigation of Defib this AM.  If OK with cardiology, then D/C home  -PO Vancomycin for C diff x 2weeks - still very odd to get Cdiff w only one periop dose of ABx but probable indicator of fragility  -keep dry.  Cr stable.  IV to medlock w PRN IVF.  Cardiology can adjust  -Back   on cardiac meds   -Full anticoag while inpatient.   Hgb stable.    -iron for anemia.  -Anxiolysis/insomnia.  PO Xanax, depending on bowel fx.  Benadryl for backup - they feel IV works better  -VTE prophylaxis- SCDs, etc  -mobilize as tolerated to help recovery.  Get PT/OT involved  I updated the patient's status to the patient, his wife, and his RN.  Recommendations were made.  Questions were answered.  They  expressed understanding & appreciation.  D/C patient from hospital when patient meets criteria (anticipate in later today):  Tolerating oral intake well Ambulating in walkways Adequate pain control without IV medications Urinating  Having flatus    [name], M.D., F.A.C.S. Gastrointestinal and Minimally Invasive [facility] [address] [place], [place] [address] [phone] Main / Paging   07/05/2014   Results:   Labs: Results for orders placed or performed during the hospital encounter of 06/27/14 (from the past 48 hour(s))  CBC     Status: Abnormal   Collection Time: 07/04/14  7:00 AM  Result Value Ref Range   WBC 11.4 (H) 4.0 - 10.5 K/uL   RBC 3.29 (L) 4.22 - 5.81 MIL/uL   Hemoglobin 10.2 (L) 13.0 - 17.0 g/dL   HCT 33.2 (L) 39.0 - 52.0 %   MCV 100.9 (H) 78.0 - 100.0 fL   MCH 31.0 26.0 - 34.0 pg   MCHC 30.7 30.0 - 36.0 g/dL   RDW 16.3 (H) 11.5 - 15.5 %   Platelets 495 (H) 150 - 400 K/uL  Basic metabolic panel     Status: Abnormal   Collection Time: 07/04/14  7:00 AM  Result Value Ref Range   Sodium 139 135 - 145 mmol/L   Potassium 3.7 3.5 - 5.1 mmol/L   Chloride 104 96 - 112 mmol/L   CO2 27 19 - 32 mmol/L   Glucose, Bld 120 (H) 70 - 99 mg/dL   BUN 25 (H) 6 - 23 mg/dL   Creatinine, Ser 1.40 (H) 0.50 - 1.35 mg/dL   Calcium 8.8 8.4 - 10.5 mg/dL   GFR calc non Af Amer 49 (L) >90 mL/min   GFR calc Af Amer 56 (L) >90 mL/min    Comment: (NOTE) The eGFR has been calculated using the CKD EPI equation. This calculation has not been validated in all clinical situations. eGFR's persistently <90 mL/min signify possible Chronic Kidney Disease.    Anion gap 8 5 - 15  Protime-INR     Status: Abnormal   Collection Time: 07/04/14  7:00 AM  Result Value Ref Range   Prothrombin Time 25.5 (H) 11.6 - 15.2 seconds   INR 2.30 (H) 0.00 - 1.49    Imaging / Studies: Dg Abd Acute W/chest  07/03/2014   CLINICAL DATA:  Emesis,  abdominal tenderness, shortness of breath  EXAM: ACUTE ABDOMEN SERIES (ABDOMEN 2 VIEW & CHEST 1 VIEW)  COMPARISON:  July 01, 2014  FINDINGS: There are multiple air-filled dilated small bowel loops. There is no free air. Air is identified in the colon. Aortic stent is identified. Cardiac pacemaker is noted. The lungs are clear. The aorta is tortuous. The heart size normal. Minor scar of left lung base is noted.  IMPRESSION: Multiple air-filled dilated small bowel loops. Air is identified in the colon. The findings can be due to small bowel ileus or early small bowel obstruction. Followup is recommended.  No acute abnormality identified within the chest.   Electronically Signed  By: Wei-Chen  Lin M.D.   On: 07/03/2014 14:03    Medications / Allergies: per chart  Antibiotics: Anti-infectives    Start     Dose/Rate Route Frequency Ordered Stop   07/01/14 1000  vancomycin (VANCOCIN) 50 mg/mL oral solution 125 mg     125 mg Oral 4 times daily 07/01/14 0810 07/15/14 0959   06/30/14 0800  metroNIDAZOLE (FLAGYL) IVPB 500 mg  Status:  Discontinued     500 mg100 mL/hr over 60 Minutes Intravenous Every 6 hours 06/30/14 0706 07/02/14 0830   06/30/14 0715  vancomycin (VANCOCIN) 50 mg/mL oral solution 125 mg  Status:  Discontinued     125 mg Oral 4 times per day 06/30/14 0706 06/30/14 0934       Note: Portions of this report may have been transcribed using voice recognition software. Every effort was made to ensure accuracy; however, inadvertent computerized transcription errors may be present.   Any transcriptional errors that result from this process are unintentional.        

## 2014-07-05 NOTE — Anesthesia Preprocedure Evaluation (Addendum)
Anesthesia Evaluation  Patient identified by MRN, date of birth, ID band Patient awake    Reviewed: Allergy & Precautions, NPO status , Patient's Chart, lab work & pertinent test results  History of Anesthesia Complications Negative for: history of anesthetic complications  Airway Mallampati: I  TM Distance: >3 FB Neck ROM: Full    Dental  (+) Teeth Intact, Dental Advisory Given   Pulmonary former smoker,          Cardiovascular hypertension, + CAD, + Past MI, + Peripheral Vascular Disease and +CHF + dysrhythmias Atrial Fibrillation + pacemaker  EF 20 - 25%   Neuro/Psych Anxiety    GI/Hepatic GERD-  Medicated,  Endo/Other  Hyperthyroidism   Renal/GU      Musculoskeletal   Abdominal   Peds  Hematology  (+) anemia ,   Anesthesia Other Findings   Reproductive/Obstetrics                            Anesthesia Physical Anesthesia Plan  ASA: III  Anesthesia Plan: MAC   Post-op Pain Management:    Induction: Intravenous  Airway Management Planned: Nasal Cannula  Additional Equipment:   Intra-op Plan:   Post-operative Plan:   Informed Consent: I have reviewed the patients History and Physical, chart, labs and discussed the procedure including the risks, benefits and alternatives for the proposed anesthesia with the patient or authorized representative who has indicated his/her understanding and acceptance.     Plan Discussed with:   Anesthesia Plan Comments:         Anesthesia Quick Evaluation

## 2014-07-05 NOTE — Progress Notes (Signed)
CARE MANAGEMENT NOTE 07/05/2014  Patient:  KEANAN, MELANDER   Account Number:  [id number]  Date Initiated:  06/27/2014  Documentation initiated by:  Elissa Hefty  Subjective/Objective Assessment:   adm w anemia,heart failure     Action/Plan:   lives w wife, pcp dr Reynold Bowen   Anticipated DC Date:  07/04/2014   Anticipated DC Plan:  [place]  CM consult      Choice offered to / List presented to:             Status of service:  Completed, signed off Medicare Important Message given?  YES (If response is "NO", the following Medicare IM given date fields will be blank) Date Medicare IM given:  07/02/2014 Medicare IM given by:  MAYO,HENRIETTA Date Additional Medicare IM given:   Additional Medicare IM given by:    Discharge Disposition:  HOME/SELF CARE  Per UR Regulation:  Reviewed for med. necessity/level of care/duration of stay  If discussed at [place] of Stay Meetings, dates discussed:   07/02/2014    Comments:  07/05/214 1730 Chart reviewed. No NCM needs identified. Jonnie Finner RN CCM Case Mgmt phone [phone]  07/02/14 Roscoe RN MSN BSN CCM Transferred to [facility] 2/2 episodes of VT tx with ATP by his AICD. Per PT, no f/u needed.

## 2014-07-05 NOTE — Discharge Summary (Signed)
Physician Discharge Summary  Patient ID: [name] MRN: [id number] DOB/AGE: 1940/08/01 74 y.o.  Admit date: 06/27/2014 Discharge date: 07/05/2014  Patient Care Team: Sheela Stack, MD as PCP - General (Endocrinology) Darlin Coco, MD as Consulting Physician (Cardiology) Deboraha Sprang, MD as Consulting Physician (Cardiology) Serafina Mitchell, MD as Consulting Physician (Vascular Surgery) Michael Boston, MD as Consulting Physician (General Surgery)  Admission Diagnoses: Principal Problem:   Symptomatic anemia Active Problems:   Cardiomyopathy, ischemic   Chronic systolic heart failure   Chronic anticoagulation   Biventricular implantable cardioverter-defibrillator in situ   HTN (hypertension)   Anxiety   Acute renal insufficiency   Clostridium difficile colitis   Bilateral inguinal hernia (BIH) s/p lap repair w mesh 06/20/2014   Hematoma of abdominal wall   Paroxysmal ventricular tachycardia   Atrial fibrillation and flutter   Discharge Diagnoses:  Principal Problem:   Symptomatic anemia Active Problems:   Cardiomyopathy, ischemic   Chronic systolic heart failure   Chronic anticoagulation   Biventricular implantable cardioverter-defibrillator in situ   HTN (hypertension)   Anxiety   Acute renal insufficiency   Clostridium difficile colitis   Bilateral inguinal hernia (BIH) s/p lap repair w mesh 06/20/2014   Hematoma of abdominal wall   Paroxysmal ventricular tachycardia   Atrial fibrillation and flutter   POST-OPERATIVE DIAGNOSIS:  * No surgery found *  SURGERY:    SURGEON:    Consults: cardiology  Hospital Course:   The patient underwent the surgery above.  He struggled with weakness and was admitted.  Anemic.  Transfused.  Lovenox stopped.  ARF.  Improved with hydration.  Cardiology followed.  Nausea and vomiting.  Some loose BMs.  Ileus diagnosed.  Treated with NGT.  Diagnosed with C Diff.  Treated with antibiotics.  The patient gradually  mobilized and advanced to a solid diet.  Mild coughing & tussive emesis for a few days but markedly improved.  Pain and other symptoms were treated aggressively.    By the time of discharge, the patient was walking well the hallways, eating food, having flatus.  Pain was well-controlled on an oral medications.  Based on meeting discharge criteria and continuing to recover, I felt it was safe for the patient to be discharged from the hospital to further recover with close followup. Postoperative recommendations were discussed in detail.  They are written as well.   Significant Diagnostic Studies:  Results for orders placed or performed during the hospital encounter of 06/27/14 (from the past 72 hour(s))  CBC     Status: Abnormal   Collection Time: 07/03/14  3:54 AM  Result Value Ref Range   WBC 13.6 (H) 4.0 - 10.5 K/uL   RBC 3.35 (L) 4.22 - 5.81 MIL/uL   Hemoglobin 10.5 (L) 13.0 - 17.0 g/dL   HCT 33.8 (L) 39.0 - 52.0 %   MCV 100.9 (H) 78.0 - 100.0 fL   MCH 31.3 26.0 - 34.0 pg   MCHC 31.1 30.0 - 36.0 g/dL   RDW 16.3 (H) 11.5 - 15.5 %   Platelets 510 (H) 150 - 400 K/uL  Basic metabolic panel     Status: Abnormal   Collection Time: 07/03/14  3:54 AM  Result Value Ref Range   Sodium 143 135 - 145 mmol/L   Potassium 3.9 3.5 - 5.1 mmol/L   Chloride 102 96 - 112 mmol/L   CO2 37 (H) 19 - 32 mmol/L   Glucose, Bld 133 (H) 70 - 99 mg/dL   BUN 20  6 - 23 mg/dL   Creatinine, Ser 1.27 0.50 - 1.35 mg/dL   Calcium 8.9 8.4 - 10.5 mg/dL   GFR calc non Af Amer 55 (L) >90 mL/min   GFR calc Af Amer 63 (L) >90 mL/min    Comment: (NOTE) The eGFR has been calculated using the CKD EPI equation. This calculation has not been validated in all clinical situations. eGFR's persistently <90 mL/min signify possible Chronic Kidney Disease.    Anion gap 4 (L) 5 - 15  Protime-INR     Status: Abnormal   Collection Time: 07/03/14  3:54 AM  Result Value Ref Range   Prothrombin Time 21.7 (H) 11.6 - 15.2 seconds    INR 1.87 (H) 0.00 - 1.49  CBC     Status: Abnormal   Collection Time: 07/04/14  7:00 AM  Result Value Ref Range   WBC 11.4 (H) 4.0 - 10.5 K/uL   RBC 3.29 (L) 4.22 - 5.81 MIL/uL   Hemoglobin 10.2 (L) 13.0 - 17.0 g/dL   HCT 33.2 (L) 39.0 - 52.0 %   MCV 100.9 (H) 78.0 - 100.0 fL   MCH 31.0 26.0 - 34.0 pg   MCHC 30.7 30.0 - 36.0 g/dL   RDW 16.3 (H) 11.5 - 15.5 %   Platelets 495 (H) 150 - 400 K/uL  Basic metabolic panel     Status: Abnormal   Collection Time: 07/04/14  7:00 AM  Result Value Ref Range   Sodium 139 135 - 145 mmol/L   Potassium 3.7 3.5 - 5.1 mmol/L   Chloride 104 96 - 112 mmol/L   CO2 27 19 - 32 mmol/L   Glucose, Bld 120 (H) 70 - 99 mg/dL   BUN 25 (H) 6 - 23 mg/dL   Creatinine, Ser 1.40 (H) 0.50 - 1.35 mg/dL   Calcium 8.8 8.4 - 10.5 mg/dL   GFR calc non Af Amer 49 (L) >90 mL/min   GFR calc Af Amer 56 (L) >90 mL/min    Comment: (NOTE) The eGFR has been calculated using the CKD EPI equation. This calculation has not been validated in all clinical situations. eGFR's persistently <90 mL/min signify possible Chronic Kidney Disease.    Anion gap 8 5 - 15  Protime-INR     Status: Abnormal   Collection Time: 07/04/14  7:00 AM  Result Value Ref Range   Prothrombin Time 25.5 (H) 11.6 - 15.2 seconds   INR 2.30 (H) 0.00 - 1.49    Dg Abd Acute W/chest  07/03/2014   CLINICAL DATA:  Emesis, abdominal tenderness, shortness of breath  EXAM: ACUTE ABDOMEN SERIES (ABDOMEN 2 VIEW & CHEST 1 VIEW)  COMPARISON:  July 01, 2014  FINDINGS: There are multiple air-filled dilated small bowel loops. There is no free air. Air is identified in the colon. Aortic stent is identified. Cardiac pacemaker is noted. The lungs are clear. The aorta is tortuous. The heart size normal. Minor scar of left lung base is noted.  IMPRESSION: Multiple air-filled dilated small bowel loops. Air is identified in the colon. The findings can be due to small bowel ileus or early small bowel obstruction. Followup is  recommended.  No acute abnormality identified within the chest.   Electronically Signed   By: Abelardo Diesel M.D.   On: 07/03/2014 14:03    Discharge Exam: Blood pressure 107/53, pulse 94, temperature 98.4 F (36.9 C), temperature source Oral, resp. rate 18, height '5\' 8"'  (1.727 m), weight 167 lb 8.8 oz (76 kg), SpO2 98 %.  General: Pt awake/alert/oriented x4 in no major acute distress Eyes: PERRL, normal EOM. Sclera nonicteric Neuro: CN II-XII intact w/o focal sensory/motor deficits. Lymph: No head/neck/groin lymphadenopathy Psych:  No delerium/psychosis/paranoia HENT: Normocephalic, Mucus membranes moist.  No thrush Neck: Supple, No tracheal deviation Chest: No pain.  Good respiratory excursion. CV:  Pulses intact.  Regular rhythm MS: Normal AROM mjr joints.  No obvious deformity Abdomen: Soft, Nondistended.  Nontender.  No incarcerated hernias. Ext:  SCDs BLE.  Moderate ecchymosis lower 1/2 of abdomen & genetalia but resolving & softer.  Left paramedian hematoma but improved.  No significant edema.  No cyanosis Skin: No petechiae / purpura  Discharged Condition: Improved   Past Medical History  Diagnosis Date  . CAD (coronary artery disease) 1999    Remote anterolater and apical MI, cath 2011 patent stents RCA/LCx  LAD w/o obstruction  . Cardiomyopathy, ischemic     EF 20-25%  echo 2012  . Chronic systolic congestive heart failure   . ICD (implantable cardiac defibrillator) in place     [facility]  . 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 Abdominal aortic aneurysm 2009    Repaired with endovascular stent  . PVD (peripheral vascular disease)     Has occluded innominate vein  . Myocardial infarction   . Hypertension   . Ventricular tachycardia     recurrent slow VT at 100-110 bpm,  Rx mexilitene/sotalol  . Left bundle branch block   . Systolic CHF, chronic   . Arthritis   . Gynecomastia     2/2 spironolactone  . Atrial fibrillation   . Pericarditis     due to  perforation  . Inguinal hernia recurrent bilateral   . GERD (gastroesophageal reflux disease)   . Anemia 05/2014  . Cardiac arrest 12/05/2013  . Acute on chronic systolic heart failure 11/30/2200  . 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 SVT (supraventricular tachycardia) 12/05/2013    Past Surgical History  Procedure Laterality Date  . Icd  Feb 2012    Change out with LV lead placed with tunneling from the right  to the left side  . Endovascular stent insertion      for AAA  . Cardiac catheterization      x2  . Pacemaker insertion    . Ventricular ablation surgery      s/p prior EPS and ablation at [facility] 9/12, [facility] 11/12, and [facility] recent [facility] 08/23/11  . Tonsillectomy    . V-tach ablation N/A 08/24/2011    Procedure: V-TACH ABLATION;  Surgeon: Thompson Grayer, MD;  Location: [facility] CATH LAB;  Service: Cardiovascular;  Laterality: N/A;  . Inguinal hernia repair Bilateral 06/20/2014    Procedure: LAPAROSCOPIC BILATERAL INGUINAL HERNIA REPAIR;  Surgeon: Michael Boston, MD;  Location: [place];  Service: General;  Laterality: Bilateral;  . Insertion of mesh N/A 06/20/2014    Procedure: INSERTION OF MESH;  Surgeon: Michael Boston, MD;  Location: [place];  Service: General;  Laterality: N/A;    History   Social History  . Marital Status: Married    Spouse Name: N/A    Number of Children: N/A  . Years of Education: N/A   Occupational History  . retired      used to Armed forces operational officer   Social History Main Topics  . Smoking status: Former Smoker    Quit date: 01/07/1994  . Smokeless tobacco: Never Used  . Alcohol  Use: Yes     Comment: very rare  . Drug Use: No  . Sexual Activity: Not Currently   Other Topics Concern  . Not on file   Social History Narrative    Family History  Problem Relation Age of Onset  . Stroke Mother   . Heart attack Mother   . Heart disease Mother   . Hyperlipidemia Mother   . Hypertension Mother   . Heart attack  Father   . Heart disease Father   . Hyperlipidemia Father   . Hypertension Father   . Heart disease Sister   . Hyperlipidemia Sister   . Hypertension Brother     Current Facility-Administered Medications  Medication Dose Route Frequency Provider Last Rate Last Dose  . 0.9 %  sodium chloride infusion  250 mL Intravenous PRN Michael Boston, MD      . 0.9 %  sodium chloride infusion  250 mL Intravenous PRN Michael Boston, MD      . acetaminophen (TYLENOL) suppository 650 mg  650 mg Rectal Q6H PRN Michael Boston, MD      . acetaminophen (TYLENOL) tablet 325-650 mg  325-650 mg Oral Q6H PRN Michael Boston, MD   650 mg at 07/04/14 1830  . ALPRAZolam [name]) tablet 0.125 mg  0.125 mg Oral 4 times per day Michael Boston, MD   0.125 mg at 07/04/14 2121  . alum & mag hydroxide-simeth (MAALOX/MYLANTA) 200-200-20 MG/5ML suspension 30 mL  30 mL Oral Q6H PRN Michael Boston, MD      . carvedilol (COREG) tablet 3.125 mg  3.125 mg Oral BID WC Arnoldo Lenis, MD   3.125 mg at 07/04/14 1831  . chlorproMAZINE (THORAZINE) 12.5 mg in sodium chloride 0.9 % 25 mL IVPB  12.5 mg Intravenous Q8H PRN Michael Boston, MD      . diphenhydrAMINE (BENADRYL) injection 12.5 mg  12.5 mg Intravenous Q6H PRN Michael Boston, MD      . fentaNYL (SUBLIMAZE) injection 25-50 mcg  25-50 mcg Intravenous Q2H PRN Michael Boston, MD      . levothyroxine (SYNTHROID, LEVOTHROID) tablet 132 mcg  132 mcg Oral Once per day on Fri Michael Boston, MD   132 mcg at 06/28/14 0608  . levothyroxine (SYNTHROID, LEVOTHROID) tablet 88 mcg  88 mcg Oral Once per day on Sun Mon Tue Wed Thu Sat Michael Boston, MD   88 mcg at 07/04/14 0910  . lip balm (BLISTEX) ointment   Topical BID Michael Boston, MD   1 application at 73/41/93 2200  . loratadine (CLARITIN) tablet 10 mg  10 mg Oral Daily Michael Boston, MD   10 mg at 07/04/14 0910  . LORazepam (ATIVAN) injection 0.5-1 mg  0.5-1 mg Intravenous Q8H PRN Michael Boston, MD   1 mg at 07/03/14 0931  . magic mouthwash  15 mL Oral QID  Michael Boston, MD   15 mL at 07/04/14 2125  . meclizine (ANTIVERT) tablet 12.5 mg  12.5 mg Oral TID PRN Michael Boston, MD      . menthol-cetylpyridinium (CEPACOL) lozenge 3 mg  1 lozenge Oral PRN Michael Boston, MD      . metoCLOPramide (REGLAN) injection 5 mg  5 mg Intravenous 4 times per day Michael Boston, MD   5 mg at 07/05/14 7902  . mexiletine (MEXITIL) capsule 150 mg  150 mg Oral 3 times per day Lelon Perla, MD   150 mg at 07/05/14 4097  . ondansetron (ZOFRAN) injection 4 mg  4 mg Intravenous Q6H PRN Remo Lipps  Earlin Sweeden, MD   4 mg at 07/03/14 0423  . pantoprazole (PROTONIX) EC tablet 40 mg  40 mg Oral BID AC Michael Boston, MD   40 mg at 07/05/14 0017  . phenol (CHLORASEPTIC) mouth spray 2 spray  2 spray Mouth/Throat PRN Michael Boston, MD   2 spray at 06/29/14 [id number]  . promethazine (PHENERGAN) injection 6.25-12.5 mg  6.25-12.5 mg Intravenous Q6H PRN Michael Boston, MD   6.258 mg at 07/02/14 1043  . rosuvastatin (CRESTOR) tablet 5 mg  5 mg Oral Once per day on Tue Sat Michael Boston, MD   5 mg at 06/29/14 1954  . saccharomyces boulardii (FLORASTOR) capsule 250 mg  250 mg Oral BID Michael Boston, MD   250 mg at 07/04/14 2121  . sodium chloride 0.9 % injection 3 mL  3 mL Intravenous Q12H Michael Boston, MD   3 mL at 07/04/14 0911  . sodium chloride 0.9 % injection 3 mL  3 mL Intravenous PRN Michael Boston, MD      . sodium chloride 0.9 % injection 3 mL  3 mL Intravenous Q12H Michael Boston, MD   3 mL at 07/04/14 2125  . sodium chloride 0.9 % injection 3 mL  3 mL Intravenous PRN Michael Boston, MD   3 mL at 07/04/14 1303  . sotalol (BETAPACE) tablet 80 mg  80 mg Oral Q12H Evans Lance, MD   80 mg at 07/04/14 2121  . traMADol (ULTRAM) tablet 50 mg  50 mg Oral Q8H PRN Michael Boston, MD      . vancomycin (VANCOCIN) 50 mg/mL oral solution 125 mg  125 mg Oral QID Michael Boston, MD   125 mg at 07/04/14 2125  . Warfarin - Pharmacist Dosing Inpatient   Does not apply q1800 Eudelia Bunch, [facility]         Allergies  Allergen  Reactions  . Iohexol Hives and Itching     Code: HIVES, Desc: hives w/ itching during cardiac cath '99, mult caths since w/ ? premeds, DR.G.HAYES REQUESTS 13 HR PRE MED//A.C.pt okay w/13 hr prep/mms, Onset Date: [id number]   . Prednisone Hives    Disposition: 01-Home or Self Care  Discharge Instructions    Call MD for:  extreme fatigue    Complete by:  As directed      Call MD for:  hives    Complete by:  As directed      Call MD for:  persistant nausea and vomiting    Complete by:  As directed      Call MD for:  redness, tenderness, or signs of infection (pain, swelling, redness, odor or green/yellow discharge around incision site)    Complete by:  As directed      Call MD for:  severe uncontrolled pain    Complete by:  As directed      Call MD for:    Complete by:  As directed   Temperature > 101.17F     Diet - low sodium heart healthy    Complete by:  As directed      Discharge instructions    Complete by:  As directed   Please see discharge instruction sheets.  Also refer to handout given an office.  Please call our office if you have any questions or concerns [phone]     Discharge wound care:    Complete by:  As directed   If you have closed incisions, shower and bathe over these incisions with soap and water every  day.  Remove all surgical dressings on postoperative day #3.  You do not need to replace dressings over the closed incisions unless you feel more comfortable with a Band-Aid covering it.   If you have an open wound that requires packing, please see wound care instructions.  In general, remove all dressings, wash wound with soap and water and then replace with saline moistened gauze.  Do the dressing change at least every day.  Please call our office [phone] if you have further questions.     Driving Restrictions    Complete by:  As directed   No driving until off narcotics and can safely swerve away without pain during an emergency     Increase activity  slowly    Complete by:  As directed   Walk an hour a day.  Use 20-30 minute walks.  When you can walk 30 minutes without difficulty, increase to low impact/moderate activities such as biking, jogging, swimming, sexual activity..  Eventually can increase to unrestricted activity when not feeling pain.  If you feel pain: STOP![name]   Let pain protect you from overdoing it.  Use ice/heat/over-the-counter pain medications to help minimize his soreness.  Use pain prescriptions as needed to remain active.  It is better to take extra pain medications and be more active than to stay bedridden to avoid all pain medications.     Lifting restrictions    Complete by:  As directed   Avoid heavy lifting initially.  Do not push through pain.  You have no specific weight limit.  Coughing and sneezing or four more stressful to your incision than any lifting you will do. Pain will protect you from injury.  Therefore, avoid intense activity until off all narcotic pain medications.  Coughing and sneezing or four more stressful to your incision than any lifting he will do.     May shower / Bathe    Complete by:  As directed      May walk up steps    Complete by:  As directed      Sexual Activity Restrictions    Complete by:  As directed   Sexual activity as tolerated.  Do not push through pain.  Pain will protect you from injury.     Walk with assistance    Complete by:  As directed   Walk over an hour a day.  May use a walker/cane/companion to help with balance and stamina.            Medication List    TAKE these medications        acetaminophen 500 MG tablet  Commonly known as:  TYLENOL  Take 1,000 mg by mouth every 6 (six) hours as needed for moderate pain.     ALPRAZolam 0.25 MG tablet  Commonly known as:  XANAX  Take 0.5-1 tablets (0.125-0.25 mg total) by mouth 4 (four) times daily.     carvedilol 12.5 MG tablet  Commonly known as:  COREG  Take 1 tablet (12.5 mg total) by mouth 2 (two) times daily  with a meal. Take one tablet by mouth twice daily     Fish Oil 1000 MG Caps  Take 1,000 mg by mouth 2 (two) times daily.     furosemide 20 MG tablet  Commonly known as:  LASIX  Take 20 mg by mouth 2 (two) times daily.     levothyroxine 88 MCG tablet  Commonly known as:  SYNTHROID, LEVOTHROID  Take 88-132 mcg by mouth  daily. Take 1.5 tablets on Friday and 1 tablet the rest of the week.     loratadine 10 MG tablet  Commonly known as:  CLARITIN  Take 10 mg by mouth daily.     losartan 50 MG tablet  Commonly known as:  COZAAR  Take 0.5 tablets (25 mg total) by mouth daily.     meclizine 25 MG tablet  Commonly known as:  ANTIVERT  TAKE 1 TABLET (25 MG TOTAL) BY MOUTH 3 (THREE) TIMES DAILY AS NEEDED.     mexiletine 150 MG capsule  Commonly known as:  MEXITIL  TAKE 1 CAPSULE THREE TIMES A DAY     multivitamin with minerals Tabs tablet  Take 1 tablet by mouth daily.     oxyCODONE 5 MG immediate release tablet  Commonly known as:  Oxy IR/ROXICODONE  Take 5 mg by mouth daily as needed for severe pain.     ranitidine 150 MG tablet  Commonly known as:  ZANTAC  Take 150 mg by mouth every evening.     rosuvastatin 5 MG tablet  Commonly known as:  CRESTOR  Take 5 mg by mouth 2 (two) times a week. Every Tuesday and Saturday     sotalol 80 MG tablet  Commonly known as:  BETAPACE  Take 80 mg by mouth 2 (two) times daily.     STOOL SOFTENER PO  Take 1 capsule by mouth daily.     traMADol 50 MG tablet  Commonly known as:  ULTRAM  Take 50 mg by mouth every 6 (six) hours as needed for severe pain.     vancomycin 50 mg/mL oral solution  Commonly known as:  VANCOCIN  Take 2.5 mLs (125 mg total) by mouth 4 (four) times daily.     vitamin C 500 MG tablet  Commonly known as:  ASCORBIC ACID  Take 500 mg by mouth daily.     warfarin 2.5 MG tablet  Commonly known as:  COUMADIN  - Take 2.5-3.75 mg by mouth daily at 6 PM. Take 3.17m ONLY on Friday  - Take 2.565mall other days           Signed: GRMorton PetersM.D., F.A.C.S. Gastrointestinal and Minimally Invasive Surgery [facility] [address]SuCumberland CityrPioneer[place] [phone] / Paging   07/05/2014, 7:18 AM

## 2014-07-05 NOTE — H&P (View-Only) (Signed)
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   He presented with VT storm in September of 2012 and underwent ablation at that time. He had recurrent VT post ablation and was transferred to [facility] where he underwent repeat ablation complicated by pericardial perforation and subsequent pericarditis. He was treated with Sotalol and Mexilitine was also added due to recurrent VT. He then had recurrent slow VT at 110-130 bpm and underwent repeat ablation in March of 2013. He has had recurrent  VT without shocks but with ATP  He was admitted for B inguinal hernia repair done laproscopically but was readmitted for postoperative anemia occurring in the context of perioperative LMWH bridging Rx by Dr TB  Hbb 7.5;  This was further complicated by acute renal failure which prompted the holding of his sotalol\  Renal function as now recovered and mexiletine  And   sotalol  Resumed  Developed ATrial flutter 60 hrs ago with few symptoms   Past Medical History  Diagnosis Date  . CAD (coronary artery disease) 1999    Remote anterolater and apical MI, cath 2011 patent stents RCA/LCx  LAD w/o obstruction  . Cardiomyopathy, ischemic     EF 20-25%  echo 2012  . Chronic systolic congestive heart failure   . ICD (implantable cardiac defibrillator) in place     [place] Jude  . 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 Abdominal aortic aneurysm 2009    Repaired with endovascular stent  . PVD (peripheral vascular disease)     Has occluded innominate vein  . Myocardial infarction   . Hypertension   . Ventricular tachycardia     recurrent slow VT at 100-110 bpm,  Rx mexilitene/sotalol  . Left bundle branch block   . Systolic CHF,  chronic   . Arthritis   . Gynecomastia     2/2 spironolactone  . Atrial fibrillation   . Pericarditis     due to perforation  . Inguinal hernia recurrent bilateral   . GERD (gastroesophageal reflux disease)   . Anemia 05/2014  . Cardiac arrest 12/05/2013  . Acute on chronic systolic heart failure 02/05/3381  . 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 SVT (supraventricular tachycardia) 12/05/2013    Scheduled Meds:  Scheduled Meds: . ALPRAZolam  0.125 mg Oral 4 times per day  . carvedilol  3.125 mg Oral BID WC  . levothyroxine  132 mcg Oral Once per day on Fri  . levothyroxine  88 mcg Oral Once per day on Sun Mon Tue Wed Thu Sat  . lip balm   Topical BID  . loratadine  10 mg Oral Daily  . magic mouthwash  15 mL Oral QID  . metoCLOPramide (REGLAN) injection  5 mg Intravenous 4 times per day  . mexiletine  150 mg Oral 3 times per day  . pantoprazole  40 mg Oral BID AC  . rosuvastatin  5 mg Oral Once per day on Tue Sat  . saccharomyces boulardii  250 mg Oral BID  . sodium chloride  3 mL Intravenous Q12H  . sodium chloride  3 mL Intravenous Q12H  . sotalol  80 mg Oral Q12H  . vancomycin  125 mg  Oral QID  . Warfarin - Pharmacist Dosing Inpatient   Does not apply q1800       PHYSICAL EXAM Filed Vitals:   07/04/14 0907 07/04/14 1322 07/04/14 2119 07/05/14 0533  BP: 93/76 102/65 112/57 107/53  Pulse:    94  Temp:  98.1 F (36.7 C) 98.6 F (37 C) 98.4 F (36.9 C)  TempSrc:  Oral Oral Oral  Resp:  16 16 18   Height:      Weight:      SpO2:  100% 91% 98%    General appearance: alert, cachectic and mild distress Neck: JVD - 7 cm above sternal notch Lungs: tubular sounds bilaterally Heart: regular rate and rhythm Abdomen: soft, non-tender; bowel sounds normal; no masses,  no organomegaly Extremities: edema none Skin: Skin color, texture, turgor normal. No rashes or lesions Neurologic: Grossly normal  TELEMETRY: Reviewed telemetry in  flutter 2:1   Intake/Output Summary (Last 24 hours) at 07/05/14 0745 Last data filed at 07/05/14 0201  Gross per 24 hour  Intake    280 ml  Output    200 ml  Net     80 ml    LABS: Basic Metabolic Panel:  Recent Labs Lab 06/29/14 0430 06/30/14 0544 07/01/14 0531 07/02/14 0342 07/03/14 0354 07/04/14 0700  NA 131* 136 141 139 143 139  K 4.9 4.6 4.3 3.7 3.9 3.7  CL 93* 99 100 101 102 104  CO2 31 31 33* 29 37* 27  GLUCOSE 150* 130* 131* 165* 133* 120*  BUN 48* 36* 32* 24* 20 25*  CREATININE 2.21* 1.56* 1.51* 1.33 1.27 1.40*  CALCIUM 9.6 8.9 9.3 8.9 8.9 8.8   Cardiac Enzymes: No results for input(s): CKTOTAL, CKMB, CKMBINDEX, TROPONINI in the last 72 hours. CBC:  Recent Labs Lab 06/29/14 0430 06/29/14 1038 06/30/14 0544 07/01/14 0531 07/02/14 0342 07/03/14 0354 07/04/14 0700  WBC  --  14.5* 9.1  --  10.4 13.6* 11.4*  HGB 9.8* 9.8* 8.6* 9.7* 10.5* 10.5* 10.2*  HCT  --  29.2* 26.4*  --  32.7* 33.8* 33.2*  MCV  --  97.3 97.8  --  100.6* 100.9* 100.9*  PLT  --  364 359  --  458* 510* 495*   PROTIME:  Recent Labs  07/03/14 0354 07/04/14 0700 07/05/14 0620  LABPROT 21.7* 25.5* 29.4*  INR 1.87* 2.30* 2.76*   Liver Function Tests: No results for input(s): AST, ALT, ALKPHOS, BILITOT, PROT, ALBUMIN in the last 72 hours. No results for input(s): LIPASE, AMYLASE in the last 72 hours. BNP: BNP (last 3 results)  Recent Labs  06/15/14 0139 06/29/14 1038  BNP 1001.8* 1486.2*    ProBNP (last 3 results)  Recent Labs  12/05/13 0047 01/17/14 2153  PROBNP 2438.0* 3228.0*    D-Dimer: No results for input(s): DDIMER in the last 72 hours. Hemoglobin A1C: No results for input(s): HGBA1C in the last 72 hours. Fasting Lipid Panel: No results for input(s): CHOL, HDL, LDLCALC, TRIG, CHOLHDL, LDLDIRECT in the last 72 hours. Thyroid Function Tests: No results for input(s): TSH, T4TOTAL, T3FREE, THYROIDAB in the last 72 hours.  Invalid input(s): FREET3 Anemia  Panel: No results for input(s): VITAMINB12, FOLATE, FERRITIN, TIBC, IRON, RETICCTPCT in the last 72 hours.   Device Interrogation: Thx to [facility] Jude inappropriate therapy confirmed 2/ feature AV interval delta.  Will inactivate as the pt has VA block in VT so discrimination should be otherwise maintained   ASSESSMENT AND PLAN:  Principal Problem:   Symptomatic anemia Active Problems:  Paroxysmal ventricular tachycardia   Chronic systolic heart failure   Cardiomyopathy, ischemic   Biventricular implantable cardioverter-defibrillator in situ   Chronic anticoagulation   HTN (hypertension)   Anxiety   Acute renal insufficiency   Clostridium difficile colitis   Bilateral inguinal hernia (BIH) s/p lap repair w mesh 06/20/2014   Hematoma of abdominal wall   Atrial fibrillation and flutter   ATrial fluter sustained Will undertake dccv today anticpate discharge afterwards Has scheduled appt with Dr TB in 30 days   Virl Axe MD  07/05/2014

## 2014-07-05 NOTE — Anesthesia Postprocedure Evaluation (Signed)
  Anesthesia Post-op Note  Patient: [name]  Procedure(s) Performed: Procedure(s): CARDIOVERSION (N/A)  Patient Location: PACU  Anesthesia Type:MAC  Level of Consciousness: awake  Airway and Oxygen Therapy: Patient Spontanous Breathing and Patient connected to nasal cannula oxygen  Post-op Pain: none  Post-op Assessment: Post-op Vital signs reviewed, Patient's Cardiovascular Status Stable, Respiratory Function Stable and Patent Airway  Post-op Vital Signs: Reviewed and stable  Last Vitals:  Filed Vitals:   07/05/14 1215  BP: 111/61  Pulse: 75  Temp:   Resp: 19    Complications: No apparent anesthesia complications

## 2014-07-05 NOTE — Procedures (Signed)
Electrical Cardioversion Procedure Note [name] [id number] 09/14/1940  Procedure: Electrical Cardioversion Indications:  Atrial flutter. INR therapeutic today.   Procedure Details Consent: Risks of procedure as well as the alternatives and risks of each were explained to the (patient/caregiver).  Consent for procedure obtained. Time Out: Verified patient identification, verified procedure, site/side was marked, verified correct patient position, special equipment/implants available, medications/allergies/relevent history reviewed, required imaging and test results available.  Performed  Patient placed on cardiac monitor, pulse oximetry, supplemental oxygen as necessary.  Sedation given: Propofol per anesthesiology Pacer pads placed anterior and posterior chest.  Cardioverted 1 time(s).  Cardioverted at 150J.  Evaluation Findings: Post procedure EKG shows: a-paced, v-paced => a-sensed, v-paced.  Complications: None Patient did tolerate procedure well.  BiV-ICD interrogated by [facility] rep after procedure, working properly.   [name] 07/05/2014, 12:10 PM

## 2014-07-05 NOTE — Progress Notes (Signed)
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   He presented with VT storm in September of 2012 and underwent ablation at that time. He had recurrent VT post ablation and was transferred to [facility] where he underwent repeat ablation complicated by pericardial perforation and subsequent pericarditis. He was treated with Sotalol and Mexilitine was also added due to recurrent VT. He then had recurrent slow VT at 110-130 bpm and underwent repeat ablation in March of 2013. He has had recurrent  VT without shocks but with ATP  He was admitted for B inguinal hernia repair done laproscopically but was readmitted for postoperative anemia occurring in the context of perioperative LMWH bridging Rx by Dr TB  Hbb 7.5;  This was further complicated by acute renal failure which prompted the holding of his sotalol\  Renal function as now recovered and mexiletine  And   sotalol  Resumed  Developed ATrial flutter 60 hrs ago with few symptoms   Past Medical History  Diagnosis Date  . CAD (coronary artery disease) 1999    Remote anterolater and apical MI, cath 2011 patent stents RCA/LCx  LAD w/o obstruction  . Cardiomyopathy, ischemic     EF 20-25%  echo 2012  . Chronic systolic congestive heart failure   . ICD (implantable cardiac defibrillator) in place     [place] Jude  . 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 Abdominal aortic aneurysm 2009    Repaired with endovascular stent  . PVD (peripheral vascular disease)     Has occluded innominate vein  . Myocardial infarction   . Hypertension   . Ventricular tachycardia     recurrent slow VT at 100-110 bpm,  Rx mexilitene/sotalol  . Left bundle branch block   . Systolic CHF,  chronic   . Arthritis   . Gynecomastia     2/2 spironolactone  . Atrial fibrillation   . Pericarditis     due to perforation  . Inguinal hernia recurrent bilateral   . GERD (gastroesophageal reflux disease)   . Anemia 05/2014  . Cardiac arrest 12/05/2013  . Acute on chronic systolic heart failure 10/06/5275  . 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 SVT (supraventricular tachycardia) 12/05/2013    Scheduled Meds:  Scheduled Meds: . ALPRAZolam  0.125 mg Oral 4 times per day  . carvedilol  3.125 mg Oral BID WC  . levothyroxine  132 mcg Oral Once per day on Fri  . levothyroxine  88 mcg Oral Once per day on Sun Mon Tue Wed Thu Sat  . lip balm   Topical BID  . loratadine  10 mg Oral Daily  . magic mouthwash  15 mL Oral QID  . metoCLOPramide (REGLAN) injection  5 mg Intravenous 4 times per day  . mexiletine  150 mg Oral 3 times per day  . pantoprazole  40 mg Oral BID AC  . rosuvastatin  5 mg Oral Once per day on Tue Sat  . saccharomyces boulardii  250 mg Oral BID  . sodium chloride  3 mL Intravenous Q12H  . sodium chloride  3 mL Intravenous Q12H  . sotalol  80 mg Oral Q12H  . vancomycin  125 mg  Oral QID  . Warfarin - Pharmacist Dosing Inpatient   Does not apply q1800       PHYSICAL EXAM Filed Vitals:   07/04/14 0907 07/04/14 1322 07/04/14 2119 07/05/14 0533  BP: 93/76 102/65 112/57 107/53  Pulse:    94  Temp:  98.1 F (36.7 C) 98.6 F (37 C) 98.4 F (36.9 C)  TempSrc:  Oral Oral Oral  Resp:  16 16 18   Height:      Weight:      SpO2:  100% 91% 98%    General appearance: alert, cachectic and mild distress Neck: JVD - 7 cm above sternal notch Lungs: tubular sounds bilaterally Heart: regular rate and rhythm Abdomen: soft, non-tender; bowel sounds normal; no masses,  no organomegaly Extremities: edema none Skin: Skin color, texture, turgor normal. No rashes or lesions Neurologic: Grossly normal  TELEMETRY: Reviewed telemetry in  flutter 2:1   Intake/Output Summary (Last 24 hours) at 07/05/14 0745 Last data filed at 07/05/14 0201  Gross per 24 hour  Intake    280 ml  Output    200 ml  Net     80 ml    LABS: Basic Metabolic Panel:  Recent Labs Lab 06/29/14 0430 06/30/14 0544 07/01/14 0531 07/02/14 0342 07/03/14 0354 07/04/14 0700  NA 131* 136 141 139 143 139  K 4.9 4.6 4.3 3.7 3.9 3.7  CL 93* 99 100 101 102 104  CO2 31 31 33* 29 37* 27  GLUCOSE 150* 130* 131* 165* 133* 120*  BUN 48* 36* 32* 24* 20 25*  CREATININE 2.21* 1.56* 1.51* 1.33 1.27 1.40*  CALCIUM 9.6 8.9 9.3 8.9 8.9 8.8   Cardiac Enzymes: No results for input(s): CKTOTAL, CKMB, CKMBINDEX, TROPONINI in the last 72 hours. CBC:  Recent Labs Lab 06/29/14 0430 06/29/14 1038 06/30/14 0544 07/01/14 0531 07/02/14 0342 07/03/14 0354 07/04/14 0700  WBC  --  14.5* 9.1  --  10.4 13.6* 11.4*  HGB 9.8* 9.8* 8.6* 9.7* 10.5* 10.5* 10.2*  HCT  --  29.2* 26.4*  --  32.7* 33.8* 33.2*  MCV  --  97.3 97.8  --  100.6* 100.9* 100.9*  PLT  --  364 359  --  458* 510* 495*   PROTIME:  Recent Labs  07/03/14 0354 07/04/14 0700 07/05/14 0620  LABPROT 21.7* 25.5* 29.4*  INR 1.87* 2.30* 2.76*   Liver Function Tests: No results for input(s): AST, ALT, ALKPHOS, BILITOT, PROT, ALBUMIN in the last 72 hours. No results for input(s): LIPASE, AMYLASE in the last 72 hours. BNP: BNP (last 3 results)  Recent Labs  06/15/14 0139 06/29/14 1038  BNP 1001.8* 1486.2*    ProBNP (last 3 results)  Recent Labs  12/05/13 0047 01/17/14 2153  PROBNP 2438.0* 3228.0*    D-Dimer: No results for input(s): DDIMER in the last 72 hours. Hemoglobin A1C: No results for input(s): HGBA1C in the last 72 hours. Fasting Lipid Panel: No results for input(s): CHOL, HDL, LDLCALC, TRIG, CHOLHDL, LDLDIRECT in the last 72 hours. Thyroid Function Tests: No results for input(s): TSH, T4TOTAL, T3FREE, THYROIDAB in the last 72 hours.  Invalid input(s): FREET3 Anemia  Panel: No results for input(s): VITAMINB12, FOLATE, FERRITIN, TIBC, IRON, RETICCTPCT in the last 72 hours.   Device Interrogation: Thx to [facility] Jude inappropriate therapy confirmed 2/ feature AV interval delta.  Will inactivate as the pt has VA block in [place] so discrimination should be otherwise maintained   ASSESSMENT AND PLAN:  Principal Problem:   Symptomatic anemia Active Problems:  Paroxysmal ventricular tachycardia   Chronic systolic heart failure   Cardiomyopathy, ischemic   Biventricular implantable cardioverter-defibrillator in situ   Chronic anticoagulation   HTN (hypertension)   Anxiety   Acute renal insufficiency   Clostridium difficile colitis   Bilateral inguinal hernia (BIH) s/p lap repair w mesh 06/20/2014   Hematoma of abdominal wall   Atrial fibrillation and flutter   ATrial fluter sustained Will undertake dccv today anticpate discharge afterwards Has scheduled appt with Dr TB in 60 days   Virl Axe MD  07/05/2014

## 2014-07-05 NOTE — Interval H&P Note (Signed)
History and Physical Interval Note:  07/05/2014 12:04 PM  [name]  has presented today for surgery, with the diagnosis of a fib  The various methods of treatment have been discussed with the patient and family. After consideration of risks, benefits and other options for treatment, the patient has consented to  Procedure(s): CARDIOVERSION (N/A) as a surgical intervention .  The patient's history has been reviewed, patient examined, no change in status, stable for surgery.  I have reviewed the patient's chart and labs.  Questions were answered to the patient's satisfaction.     Josey Forcier [employer]

## 2014-07-05 NOTE — Anesthesia Postprocedure Evaluation (Signed)
  Anesthesia Post-op Note  Patient: [name]  Procedure(s) Performed: Procedure(s): CARDIOVERSION (N/A)  Patient Location: Endoscopy Unit  Anesthesia Type:MAC  Level of Consciousness: awake, alert  and oriented  Airway and Oxygen Therapy: Patient Spontanous Breathing and Patient connected to nasal cannula oxygen  Post-op Pain: none  Post-op Assessment: Post-op Vital signs reviewed, Patient's Cardiovascular Status Stable, Respiratory Function Stable, Patent Airway and No signs of Nausea or vomiting  Post-op Vital Signs: Reviewed and stable  Last Vitals:  Filed Vitals:   07/05/14 1215  BP:   Pulse: 75  Temp:   Resp: 19    Complications: No apparent anesthesia complications

## 2014-07-05 NOTE — Transfer of Care (Signed)
Immediate Anesthesia Transfer of Care Note  Patient: [name]  Procedure(s) Performed: Procedure(s): CARDIOVERSION (N/A)  Patient Location: Endoscopy Unit  Anesthesia Type:MAC  Level of Consciousness: sedated  Airway & Oxygen Therapy: Patient Spontanous Breathing and Patient connected to nasal cannula oxygen  Post-op Assessment: Report given to RN and Post -op Vital signs reviewed and stable  Post vital signs: Reviewed and stable  Last Vitals:  Filed Vitals:   07/05/14 1154  BP: 119/63  Pulse: 124  Temp:   Resp: 23    Complications: No apparent anesthesia complications

## 2014-07-08 ENCOUNTER — Encounter (HOSPITAL_COMMUNITY): Payer: Self-pay | Admitting: Cardiology

## 2014-07-09 ENCOUNTER — Other Ambulatory Visit: Payer: Self-pay | Admitting: Cardiology

## 2014-07-09 ENCOUNTER — Ambulatory Visit (INDEPENDENT_AMBULATORY_CARE_PROVIDER_SITE_OTHER): Payer: Medicare Other

## 2014-07-09 DIAGNOSIS — Z7901 Long term (current) use of anticoagulants: Secondary | ICD-10-CM

## 2014-07-09 DIAGNOSIS — I255 Ischemic cardiomyopathy: Secondary | ICD-10-CM

## 2014-07-09 LAB — POCT INR: INR: 2.6

## 2014-07-10 ENCOUNTER — Encounter: Payer: Self-pay | Admitting: Internal Medicine

## 2014-07-19 ENCOUNTER — Ambulatory Visit (INDEPENDENT_AMBULATORY_CARE_PROVIDER_SITE_OTHER): Payer: Medicare Other | Admitting: *Deleted

## 2014-07-19 ENCOUNTER — Ambulatory Visit (INDEPENDENT_AMBULATORY_CARE_PROVIDER_SITE_OTHER): Payer: Medicare Other | Admitting: Cardiology

## 2014-07-19 ENCOUNTER — Encounter: Payer: Self-pay | Admitting: Internal Medicine

## 2014-07-19 VITALS — BP 106/72 | HR 98 | Ht 68.0 in | Wt 163.8 lb

## 2014-07-19 DIAGNOSIS — I4892 Unspecified atrial flutter: Secondary | ICD-10-CM

## 2014-07-19 DIAGNOSIS — I255 Ischemic cardiomyopathy: Secondary | ICD-10-CM

## 2014-07-19 DIAGNOSIS — I10 Essential (primary) hypertension: Secondary | ICD-10-CM

## 2014-07-19 DIAGNOSIS — Z7901 Long term (current) use of anticoagulants: Secondary | ICD-10-CM

## 2014-07-19 LAB — POCT INR: INR: 1.5

## 2014-07-19 LAB — CBC WITH DIFFERENTIAL/PLATELET
BASOS PCT: 0.4 % (ref 0.0–3.0)
Basophils Absolute: 0 10*3/uL (ref 0.0–0.1)
Eosinophils Absolute: 0.1 10*3/uL (ref 0.0–0.7)
Eosinophils Relative: 1.8 % (ref 0.0–5.0)
HCT: 38.6 % — ABNORMAL LOW (ref 39.0–52.0)
Hemoglobin: 13 g/dL (ref 13.0–17.0)
Lymphocytes Relative: 26 % (ref 12.0–46.0)
Lymphs Abs: 1.7 10*3/uL (ref 0.7–4.0)
MCHC: 33.7 g/dL (ref 30.0–36.0)
MCV: 96.3 fl (ref 78.0–100.0)
Monocytes Absolute: 0.9 10*3/uL (ref 0.1–1.0)
Monocytes Relative: 13 % — ABNORMAL HIGH (ref 3.0–12.0)
NEUTROS ABS: 3.9 10*3/uL (ref 1.4–7.7)
NEUTROS PCT: 58.8 % (ref 43.0–77.0)
Platelets: 308 10*3/uL (ref 150.0–400.0)
RBC: 4.01 Mil/uL — AB (ref 4.22–5.81)
RDW: 15.9 % — ABNORMAL HIGH (ref 11.5–15.5)
WBC: 6.6 10*3/uL (ref 4.0–10.5)

## 2014-07-19 LAB — BASIC METABOLIC PANEL
BUN: 21 mg/dL (ref 6–23)
CALCIUM: 9.7 mg/dL (ref 8.4–10.5)
CHLORIDE: 102 meq/L (ref 96–112)
CO2: 31 meq/L (ref 19–32)
Creatinine, Ser: 1.19 mg/dL (ref 0.40–1.50)
GFR: 63.73 mL/min (ref >60.00)
Glucose, Bld: 98 mg/dL (ref 70–99)
POTASSIUM: 4.3 meq/L (ref 3.5–5.1)
SODIUM: 138 meq/L (ref 135–145)

## 2014-07-19 MED ORDER — SOTALOL HCL 120 MG PO TABS: 120.0000 mg | ORAL_TABLET | Freq: Two times a day (BID) | ORAL | Status: DC

## 2014-07-19 NOTE — Progress Notes (Signed)
Cardiology Office Note   Date:  07/19/2014   ID:  [name], DOB [birth year], MRN [id number]  PCP:  Sheela Stack, MD  Cardiologist:   Darlin Coco, MD   No chief complaint on file.     History of Present Illness: [name] is a 74 y.o. male who presents for a post hospital follow-up office visit.  This pleasant 74 year old gentleman is seen for a post hospital office visit.He was admitted on 06/27/14 and discharged on 07/05/14.  He was admitted because of progressive weakness and postoperative blood loss with hemoglobin of 7.5.  A week or earlier on 06/20/14 he had undergone laparoscopic repair of inguinal hernias.  He has been on chronic anticoagulation.  He did well initially postoperatively but then after anticoagulation was resumed he developed postoperative blood loss which required 2 units of packed cells.  While in the hospital he went into atrial flutter.  On the day of discharge, 07/05/14 he underwent direct current cardioversion prior to discharge.  The cardioversion was successful and he went home in normal sinus rhythm.  We do not have any EKGs in the chart since 07/01/2014.  He states that initially after cardioversion he felt better.  Came back into the office today and was found to be back in atrial flutter.  He is not sure when he went back into atrial flutter and was not aware that he was in atrial flutter today.    He has a history of a remote anterior wall myocardial infarction. He has an ischemic cardiomyopathy. He has a left ventricular apical aneurysm. He has had a defibrillator for ventricular tachycardia. He has been followed closely by Dr. Caryl Comes and Dr. Rayann Heman. 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 Subsequently he had recurrent ventricular tachycardia and was transferred to [facility] for incessant VT and underwent a second ablation procedure complicated by pericardial perforation and pericarditis. He underwent  a third catheter VT ablation procedure in March 2013 by Dr. Rayann Heman and since then has felt well. He has not been aware of any palpitations. He has not been aware of any shocks from his ICD. He does have a known ischemic cardiomyopathy with ejection fraction of 25-30% by echocardiogram in February 2012. In autumn 2014 he had some problems with fluid overload [place] to increased diuretic and to dietary salt restriction. At the time of this prior admission his echocardiogram on 12/06/13 showed an ejection fraction of 20-25% and there was an apical aneurysm. His current antiarrhythmic therapy consisting of mexiletine and of sotalol 80 mg twice a day.  Past Medical History  Diagnosis Date  . CAD (coronary artery disease) 1999    Remote anterolater and apical MI, cath 2011 patent stents RCA/LCx  LAD w/o obstruction  . Cardiomyopathy, ischemic     EF 20-25%  echo 2012  . Chronic systolic congestive heart failure   . ICD (implantable cardiac defibrillator) in place     [name]  . 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 Abdominal aortic aneurysm 2009    Repaired with endovascular stent  . PVD (peripheral vascular disease)     Has occluded innominate vein  . Myocardial infarction   . Hypertension   . Ventricular tachycardia     recurrent slow VT at 100-110 bpm,  Rx mexilitene/sotalol  . Left bundle branch block   . Systolic CHF, chronic   . Arthritis   .  Gynecomastia     2/2 spironolactone  . Atrial fibrillation   . Pericarditis     due to perforation  . Inguinal hernia recurrent bilateral   . GERD (gastroesophageal reflux disease)   . Anemia 05/2014  . Cardiac arrest 12/05/2013  . Acute on chronic systolic heart failure 07/04/2681  . 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 SVT (supraventricular tachycardia) 12/05/2013    Past Surgical History  Procedure Laterality Date  . Icd  Feb 2012    Change out with  LV lead placed with tunneling from the right  to the left side  . Endovascular stent insertion      for AAA  . Cardiac catheterization      x2  . Pacemaker insertion    . Ventricular ablation surgery      s/p prior EPS and ablation at [facility] 9/12, [facility] 11/12, and most recent at [facility] 08/23/11  . Tonsillectomy    . V-tach ablation N/A 08/24/2011    Procedure: V-TACH ABLATION;  Surgeon: Thompson Grayer, MD;  Location: [facility] CATH LAB;  Service: Cardiovascular;  Laterality: N/A;  . Inguinal hernia repair Bilateral 06/20/2014    Procedure: LAPAROSCOPIC BILATERAL INGUINAL HERNIA REPAIR;  Surgeon: Michael Boston, MD;  Location: [facility];  Service: General;  Laterality: Bilateral;  . Insertion of mesh N/A 06/20/2014    Procedure: INSERTION OF MESH;  Surgeon: Michael Boston, MD;  Location: [facility];  Service: General;  Laterality: N/A;  . Cardioversion N/A 07/05/2014    Procedure: CARDIOVERSION;  Surgeon: Larey Dresser, MD;  Location: [place];  Service: Cardiovascular;  Laterality: N/A;     Current Outpatient Prescriptions  Medication Sig Dispense Refill  . acetaminophen (TYLENOL) 500 MG tablet Take 1,000 mg by mouth every 6 (six) hours as needed for moderate pain.    [name] ALPRAZolam (XANAX) 0.25 MG tablet Take 0.5-1 tablets (0.125-0.25 mg total) by mouth 4 (four) times daily. (Patient taking differently: Take 0.125-0.25 mg by mouth See admin instructions. Takes 1/2 tab around 2AM, takes 1 tab at 6AM,  Takes 1/2 tab at 1030AM, takes 1 tab at [facility], takes 1/2 at [facility], and takes 1/2 tab at 9PM) 360 tablet 1  . Ascorbic Acid (VITAMIN C) 500 MG tablet Take 500 mg by mouth daily.      . carvedilol (COREG) 12.5 MG tablet Take 1 tablet (12.5 mg total) by mouth 2 (two) times daily with a meal. Take one tablet by mouth twice daily 60 tablet 11  . Docusate Calcium (STOOL SOFTENER PO) Take 1 capsule by mouth daily.    . furosemide (LASIX) 20 MG tablet Take 20 mg by mouth 2 (two) times daily.    [name] levothyroxine  (SYNTHROID, LEVOTHROID) 88 MCG tablet Take 88-132 mcg by mouth daily. Take 1.5 tablets on Friday and 1 tablet the rest of the week.    . loratadine (CLARITIN) 10 MG tablet Take 10 mg by mouth daily.    [name] losartan (COZAAR) 50 MG tablet Take 0.5 tablets (25 mg total) by mouth daily. 90 tablet 3  . meclizine (ANTIVERT) 25 MG tablet TAKE 1 TABLET (25 MG TOTAL) BY MOUTH 3 (THREE) TIMES DAILY AS NEEDED. 270 tablet 1  . mexiletine (MEXITIL) 150 MG capsule TAKE 1 CAPSULE THREE TIMES A DAY 270 capsule 1  . Multiple Vitamin (MULTIVITAMIN WITH MINERALS) TABS tablet Take 1 tablet by mouth daily.    . Omega-3 Fatty Acids (FISH OIL) 1000  MG CAPS Take 1,000 mg by mouth daily.     . ranitidine (ZANTAC) 150 MG tablet Take 150 mg by mouth every evening.    . rosuvastatin (CRESTOR) 5 MG tablet Take 5 mg by mouth 2 (two) times a week. Every Tuesday and Saturday    . sotalol (BETAPACE) 120 MG tablet Take 1 tablet (120 mg total) by mouth 2 (two) times daily. 60 tablet 5  . warfarin (COUMADIN) 2.5 MG tablet Take 2.5-3.75 mg by mouth daily at 6 PM. Take 3.75mg  ONLY on Friday Take 2.5mg  all other days     No current facility-administered medications for this visit.    Allergies:   Iohexol and Prednisone    Social History:  The patient  reports that he quit smoking about 20 years ago. He has never used smokeless tobacco. He reports that he drinks alcohol. He reports that he does not use illicit drugs.   Family History:  The patient's family history includes Heart attack in his father and mother; Heart disease in his father, mother, and sister; Hyperlipidemia in his father, mother, and sister; Hypertension in his brother, father, and mother; Stroke in his mother.    ROS:  Please see the history of present illness.   Otherwise, review of systems are positive for none.   All other systems are reviewed and negative.    PHYSICAL EXAM: VS:  BP 106/72 mmHg  Pulse 98  Ht 5\' 8"  (1.727 m)  Wt 163 lb 12.8 oz (74.299 kg)   BMI 24.91 kg/m2 , BMI Body mass index is 24.91 kg/(m^2). GEN: Well nourished, well developed, in no acute distress HEENT: normal Neck: no JVD, carotid bruits, or masses Cardiac: The rhythm is regular.  He is in atrial flutter with 2 to one block.; no murmurs, rubs, or gallops,no edema  Respiratory:  clear to auscultation bilaterally, normal work of breathing GI: soft, nontender, nondistended, + BS MS: no deformity or atrophy Skin: warm and dry, no rash Neuro:  Strength and sensation are intact Psych: euthymic mood, full affect   EKG:  EKG is ordered today. The ekg ordered today demonstrates atrial flutter with 2 to one block.  This was confirmed through his defibrillator with intracardiac electrogram.   Recent Labs: 12/18/2013: TSH 0.85 01/17/2014: Pro B Natriuretic peptide (BNP) 3228.0* 01/20/2014: ALT 15 05/06/2014: Magnesium 2.1 06/29/2014: B Natriuretic Peptide 1486.2* 07/19/2014: BUN 21; Creatinine 1.19; Hemoglobin 13.0; Platelets 308.0; Potassium 4.3; Sodium 138    Lipid Panel    Component Value Date/Time   CHOL 129 12/18/2013 1032   TRIG 111.0 12/18/2013 1032   HDL 31.20* 12/18/2013 1032   CHOLHDL 4 12/18/2013 1032   VLDL 22.2 12/18/2013 1032   LDLCALC 76 12/18/2013 1032      Wt Readings from Last 3 Encounters:  07/19/14 163 lb 12.8 oz (74.299 kg)  07/01/14 167 lb 8.8 oz (76 kg)  06/20/14 163 lb (73.936 kg)      Unfortunately, his INR today was only 1.5.  He attributes this to the fact that the boost that he has been drinking contains vitamin K.  Because of the subtherapeutic INR, timing of his elective cardioversion will have to be delayed.   ASSESSMENT AND PLAN:  1.  Recurrent atrial flutter 1. ischemic heart disease with ischemic cardiomyopathy. Ejection fraction 20-25% with apical aneurysm by echocardiogram 12/06/13  2. recent cardiac arrest secondary to acute respiratory insufficiency and pulmonary edema secondary to combined systolic and diastolic acute heart  failure.  3. past history of  ventricular tachycardia. He has had 3 separate VT ablation procedures, most recently in March 2013 by Dr. Rayann Heman  4. hypothyroidism followed by Dr. Forde Dandy  5. hypercholesterolemia  6. recent admission 01/17/14-01/20/14 for dyspnea felt to be probable acute bronchitis as well as slight worsening of his chronic systolic heart failure.normal VQ scan 7. Recent emergency room visit on January 16 for acute shortness of breath which responded to IV Lasix. He is now on a higher dose of Lasix 20 mg twice a day. 8.  Status post bilateral laparoscopic inguinal hernia repair with postoperative blood loss after resumption of anticoagulation.   Current medicines are reviewed at length with the patient today.  The patient does not have concerns regarding medicines.  The following changes have been made:  Discussed with Dr. Caryl Comes.  We will increase his sotalol to 120 mg twice a day.  His INR is subtherapeutic.  His dose is being adjusted in the Coumadin clinic.  He is stopping his boost which contains vitamin K  Labs/ tests ordered today include:  Orders Placed This Encounter  Procedures  . Basic metabolic panel  . CBC with Differential/Platelet  . EKG 12-Lead     Disposition:   FU with Dr. Mare Ferrari in 3 weeks for office visit and EKG.  After he has had therapeutic levels of Coumadin for 4 consecutive weeks, we will plan elective cardioversion.  Signed, Darlin Coco, MD  07/19/2014 4:40 PM    [facility] [place], [place], [place]  [address] Phone: [phone]; Fax: [phone]

## 2014-07-19 NOTE — Patient Instructions (Signed)
Will obtain labs today and call you with the results (cbc/bmet)  INCREASE YOUR SOTALOL TO 120 MG TWICE A DAY, RX SENT TO PHARMACY   Will have you get weekly Coumadin checks in preparation for Cardioversion  Your physician recommends that you schedule a follow-up appointment in:  3/9 at 9:45

## 2014-07-19 NOTE — Progress Notes (Signed)
Quick Note:  Please report to patient. The recent labs are stable. Continue same medication and careful diet. Hgb is up to 13.0 very good ______

## 2014-07-22 ENCOUNTER — Telehealth: Payer: Self-pay

## 2014-07-22 MED ORDER — WARFARIN SODIUM 2.5 MG PO TABS: ORAL_TABLET | ORAL | Status: DC

## 2014-07-22 NOTE — Addendum Note (Signed)
Addended by: Theophilus Kinds on: 07/22/2014 01:16 PM   Modules accepted: Orders

## 2014-07-23 ENCOUNTER — Telehealth: Payer: Self-pay | Admitting: Cardiology

## 2014-07-23 NOTE — Telephone Encounter (Signed)
Spoke with patients wife and she is concerned with patients heart rate being elevated Only a couple times has patients heart rate been in the 70's and that was for less than an hour Heart rate usually up in the 90's  Wife concerned with heart rate being up in the 100's this morning with highest being 112  Will forward to  Dr. Mare Ferrari for review

## 2014-07-23 NOTE — Telephone Encounter (Signed)
The fact that his heart rate is in the 90s is consistent with the fact that he is still in atrial flutter rather than back in normal sinus rhythm.  Would continue current therapy.  Anticipate elective cardioversion in 3-4 weeks after adequate anticoagulation if he remains in flutter

## 2014-07-23 NOTE — Telephone Encounter (Signed)
New Message  Pt wife requested to speak w/ Rn about pt's recent elevated heart rate and 'a-flutter'. Please call back and discuss.

## 2014-07-23 NOTE — Telephone Encounter (Signed)
Advised patient and wife

## 2014-07-24 ENCOUNTER — Encounter: Payer: Self-pay | Admitting: *Deleted

## 2014-07-25 ENCOUNTER — Ambulatory Visit (INDEPENDENT_AMBULATORY_CARE_PROVIDER_SITE_OTHER): Payer: Medicare Other | Admitting: *Deleted

## 2014-07-25 DIAGNOSIS — I255 Ischemic cardiomyopathy: Secondary | ICD-10-CM

## 2014-07-25 DIAGNOSIS — Z7901 Long term (current) use of anticoagulants: Secondary | ICD-10-CM

## 2014-07-25 LAB — POCT INR: INR: 2.2

## 2014-08-01 ENCOUNTER — Ambulatory Visit (INDEPENDENT_AMBULATORY_CARE_PROVIDER_SITE_OTHER): Payer: Medicare Other | Admitting: Pharmacist

## 2014-08-01 DIAGNOSIS — Z7901 Long term (current) use of anticoagulants: Secondary | ICD-10-CM

## 2014-08-01 DIAGNOSIS — I255 Ischemic cardiomyopathy: Secondary | ICD-10-CM

## 2014-08-01 LAB — POCT INR: INR: 2.1

## 2014-08-05 ENCOUNTER — Ambulatory Visit (INDEPENDENT_AMBULATORY_CARE_PROVIDER_SITE_OTHER): Payer: Medicare Other | Admitting: *Deleted

## 2014-08-05 ENCOUNTER — Encounter: Payer: Self-pay | Admitting: Internal Medicine

## 2014-08-05 DIAGNOSIS — I255 Ischemic cardiomyopathy: Secondary | ICD-10-CM | POA: Diagnosis not present

## 2014-08-05 LAB — MDC_IDC_ENUM_SESS_TYPE_REMOTE
Battery Remaining Percentage: 25 %
Battery Voltage: 2.81 V
Brady Statistic AP VS Percent: 1 %
Brady Statistic AS VP Percent: 11 %
Brady Statistic AS VS Percent: 1.4 %
Brady Statistic RA Percent Paced: 43 %
HighPow Impedance: 47 Ohm
Lead Channel Impedance Value: 330 Ohm
Lead Channel Pacing Threshold Amplitude: 1 V
Lead Channel Pacing Threshold Amplitude: 1 V
Lead Channel Pacing Threshold Amplitude: 1.25 V
Lead Channel Pacing Threshold Pulse Width: 0.5 ms
Lead Channel Pacing Threshold Pulse Width: 1 ms
Lead Channel Sensing Intrinsic Amplitude: 2.1 mV
Lead Channel Setting Pacing Pulse Width: 0.5 ms
MDC IDC MSMT BATTERY REMAINING LONGEVITY: 18 mo
MDC IDC MSMT LEADCHNL LV IMPEDANCE VALUE: 900 Ohm
MDC IDC MSMT LEADCHNL RA IMPEDANCE VALUE: 380 Ohm
MDC IDC MSMT LEADCHNL RV PACING THRESHOLD PULSEWIDTH: 0.5 ms
MDC IDC MSMT LEADCHNL RV SENSING INTR AMPL: 11.8 mV
MDC IDC PG SERIAL: 828324
MDC IDC SESS DTM: 20160307083759
MDC IDC SET LEADCHNL LV PACING AMPLITUDE: 2.5 V
MDC IDC SET LEADCHNL RA PACING AMPLITUDE: 2.5 V
MDC IDC SET LEADCHNL RV PACING AMPLITUDE: 2.5 V
MDC IDC SET LEADCHNL RV PACING PULSEWIDTH: 0.5 ms
MDC IDC SET LEADCHNL RV SENSING SENSITIVITY: 0.5 mV
MDC IDC SET ZONE DETECTION INTERVAL: 310 ms
MDC IDC SET ZONE DETECTION INTERVAL: 570 ms
MDC IDC STAT BRADY AP VP PERCENT: 87 %

## 2014-08-05 NOTE — Progress Notes (Signed)
Remote ICD transmission.   

## 2014-08-07 ENCOUNTER — Ambulatory Visit (INDEPENDENT_AMBULATORY_CARE_PROVIDER_SITE_OTHER): Payer: Medicare Other | Admitting: Cardiology

## 2014-08-07 ENCOUNTER — Encounter: Payer: Self-pay | Admitting: Cardiology

## 2014-08-07 ENCOUNTER — Ambulatory Visit (INDEPENDENT_AMBULATORY_CARE_PROVIDER_SITE_OTHER): Payer: Medicare Other | Admitting: *Deleted

## 2014-08-07 VITALS — BP 138/68 | HR 70 | Ht 68.0 in | Wt 163.4 lb

## 2014-08-07 DIAGNOSIS — Z7901 Long term (current) use of anticoagulants: Secondary | ICD-10-CM | POA: Diagnosis not present

## 2014-08-07 DIAGNOSIS — I255 Ischemic cardiomyopathy: Secondary | ICD-10-CM

## 2014-08-07 DIAGNOSIS — I10 Essential (primary) hypertension: Secondary | ICD-10-CM

## 2014-08-07 LAB — POCT INR: INR: 1.9

## 2014-08-07 NOTE — Progress Notes (Signed)
Cardiology Office Note   Date:  08/07/2014   ID:  [name], DOB [birth year], MRN [id number]  PCP:  Sheela Stack, MD  Cardiologist:   Darlin Coco, MD   No chief complaint on file.     History of Present Illness: [name] is a 74 y.o. male who presents for follow-up of his atrial flutter.  This pleasant 74 year old gentleman is seen for a post hospital office visit.He was admitted on 06/27/14 and discharged on 07/05/14. He was admitted because of progressive weakness and postoperative blood loss with hemoglobin of 7.5. A week or earlier on 06/20/14 he had undergone laparoscopic repair of inguinal hernias. He has been on chronic anticoagulation. He did well initially postoperatively but then after anticoagulation was resumed he developed postoperative blood loss which required 2 units of packed cells. While in the hospital he went into atrial flutter. On the day of discharge, 07/05/14 he underwent direct current cardioversion prior to discharge. The cardioversion was successful and he went home in normal sinus rhythm. We do not have any EKGs in the chart since 07/01/2014. He states that initially after cardioversion he felt better. Came back into the office 3 weeks ago and was found to be back in atrial flutter. He is not sure when he went back into atrial flutter and was not aware that he was in atrial flutter at that time.  Fortunately, today he is back in normal sinus rhythm with AV pacing  He has a history of a remote anterior wall myocardial infarction. He has an ischemic cardiomyopathy. He has a left ventricular apical aneurysm. He has had a defibrillator for ventricular tachycardia. He has been followed closely by Dr. Caryl Comes and Dr. Rayann Heman. 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 Subsequently he had recurrent ventricular tachycardia and was transferred to [facility] for incessant VT and underwent a second ablation  procedure complicated by pericardial perforation and pericarditis. He underwent a third catheter VT ablation procedure in March 2013 by Dr. Rayann Heman and since then has felt well. He has not been aware of any palpitations. He has not been aware of any shocks from his ICD. He does have a known ischemic cardiomyopathy with ejection fraction of 25-30% by echocardiogram in February 2012. In autumn 2014 he had some problems with fluid overload [place] to increased diuretic and to dietary salt restriction. At the time of this prior admission his echocardiogram on 12/06/13 showed an ejection fraction of 20-25% and there was an apical aneurysm. His current antiarrhythmic therapy consisting of mexiletine and of sotalol 120 mg twice a day.  Past Medical History  Diagnosis Date  . CAD (coronary artery disease) 1999    Remote anterolater and apical MI, cath 2011 patent stents RCA/LCx  LAD w/o obstruction  . Cardiomyopathy, ischemic     EF 20-25%  echo 2012  . Chronic systolic congestive heart failure   . ICD (implantable cardiac defibrillator) in place     [employer]  . 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 Abdominal aortic aneurysm 2009    Repaired with endovascular stent  . PVD (peripheral vascular disease)     Has occluded innominate vein  . Myocardial infarction   . Hypertension   . Ventricular tachycardia     recurrent slow VT at 100-110 bpm,  Rx mexilitene/sotalol  . Left bundle branch block   . Systolic CHF, chronic   .  Arthritis   . Gynecomastia     2/2 spironolactone  . Atrial fibrillation   . Pericarditis     due to perforation  . Inguinal hernia recurrent bilateral   . GERD (gastroesophageal reflux disease)   . Anemia 05/2014  . Cardiac arrest 12/05/2013  . Acute on chronic systolic heart failure 08/30/7060  . 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 SVT (supraventricular tachycardia) 12/05/2013    Past  Surgical History  Procedure Laterality Date  . Icd  Feb 2012    Change out with LV lead placed with tunneling from the right  to the left side  . Endovascular stent insertion      for AAA  . Cardiac catheterization      x2  . Pacemaker insertion    . Ventricular ablation surgery      s/p prior EPS and ablation at [facility] 9/12, [facility] 11/12, and most recent at [facility] 08/23/11  . Tonsillectomy    . V-tach ablation N/A 08/24/2011    Procedure: V-TACH ABLATION;  Surgeon: Thompson Grayer, MD;  Location: [facility] CATH LAB;  Service: Cardiovascular;  Laterality: N/A;  . Inguinal hernia repair Bilateral 06/20/2014    Procedure: LAPAROSCOPIC BILATERAL INGUINAL HERNIA REPAIR;  Surgeon: Michael Boston, MD;  Location: [place];  Service: General;  Laterality: Bilateral;  . Insertion of mesh N/A 06/20/2014    Procedure: INSERTION OF MESH;  Surgeon: Michael Boston, MD;  Location: [place];  Service: General;  Laterality: N/A;  . Cardioversion N/A 07/05/2014    Procedure: CARDIOVERSION;  Surgeon: Larey Dresser, MD;  Location: [place];  Service: Cardiovascular;  Laterality: N/A;     Current Outpatient Prescriptions  Medication Sig Dispense Refill  . acetaminophen (TYLENOL) 500 MG tablet Take 1,000 mg by mouth every 6 (six) hours as needed for moderate pain.    [name] ALPRAZolam (XANAX) 0.25 MG tablet Take 0.5-1 tablets (0.125-0.25 mg total) by mouth 4 (four) times daily. (Patient taking differently: Take 0.125-0.25 mg by mouth See admin instructions. Takes 1/2 tab around 2AM, takes 1 tab at 6AM,  Takes 1/2 tab at 1030AM, takes 1 tab at [facility], takes 1/2 at [facility], and takes 1/2 tab at 9PM) 360 tablet 1  . Ascorbic Acid (VITAMIN C) 500 MG tablet Take 500 mg by mouth daily.      . carvedilol (COREG) 12.5 MG tablet Take 1 tablet (12.5 mg total) by mouth 2 (two) times daily with a meal. Take one tablet by mouth twice daily 60 tablet 11  . Docusate Calcium (STOOL SOFTENER PO) Take 1 capsule by mouth daily.    . furosemide (LASIX)  20 MG tablet Take 20 mg by mouth 2 (two) times daily.    [name] levothyroxine (SYNTHROID, LEVOTHROID) 88 MCG tablet Take 88-132 mcg by mouth daily. Take 1.5 tablets on Friday and 1 tablet the rest of the week.    . loratadine (CLARITIN) 10 MG tablet Take 10 mg by mouth daily.    [name] losartan (COZAAR) 50 MG tablet Take 0.5 tablets (25 mg total) by mouth daily. 90 tablet 3  . meclizine (ANTIVERT) 25 MG tablet TAKE 1 TABLET (25 MG TOTAL) BY MOUTH 3 (THREE) TIMES DAILY AS NEEDED. 270 tablet 1  . mexiletine (MEXITIL) 150 MG capsule TAKE 1 CAPSULE THREE TIMES A DAY 270 capsule 1  . Multiple Vitamin (MULTIVITAMIN WITH MINERALS) TABS tablet Take 1 tablet by mouth daily.    . Omega-3 Fatty  Acids (FISH OIL) 1000 MG CAPS Take 1,000 mg by mouth daily.     . ranitidine (ZANTAC) 150 MG tablet Take 150 mg by mouth every evening.    . rosuvastatin (CRESTOR) 5 MG tablet Take 5 mg by mouth 2 (two) times a week. Every Tuesday and Saturday    . sotalol (BETAPACE) 120 MG tablet Take 1 tablet (120 mg total) by mouth 2 (two) times daily. 60 tablet 5  . warfarin (COUMADIN) 2.5 MG tablet Take as directed by Coumadin clinic 40 tablet 3   No current facility-administered medications for this visit.    Allergies:   Iohexol and Prednisone    Social History:  The patient  reports that he quit smoking about 20 years ago. He has never used smokeless tobacco. He reports that he drinks alcohol. He reports that he does not use illicit drugs.   Family History:  The patient's family history includes Heart attack in his father and mother; Heart disease in his father, mother, and sister; Hyperlipidemia in his father, mother, and sister; Hypertension in his brother, father, and mother; Stroke in his mother.    ROS:  Please see the history of present illness.   Otherwise, review of systems are positive for none.   All other systems are reviewed and negative.    PHYSICAL EXAM: VS:  BP 138/68 mmHg  Pulse 70  Ht 5\' 8"  (1.727 m)  Wt 163  lb 6.4 oz (74.118 kg)  BMI 24.85 kg/m2 , BMI Body mass index is 24.85 kg/(m^2). GEN: Well nourished, well developed, in no acute distress HEENT: normal Neck: no JVD, carotid bruits, or masses Cardiac: RRR; no murmurs, rubs, or gallops,no edema  Respiratory:  clear to auscultation bilaterally, normal work of breathing GI: soft, nontender, nondistended, + BS MS: no deformity or atrophy Skin: warm and dry, no rash Neuro:  Strength and sensation are intact Psych: euthymic mood, full affect   EKG:  EKG is ordered today. The ekg ordered today demonstrates AV pacing at 70 bpm.  Since prior tracing of 07/19/14, atrial flutter is no longer seen.  His QTc interval today is 509 ms.   Recent Labs: 12/18/2013: TSH 0.85 01/17/2014: Pro B Natriuretic peptide (BNP) 3228.0* 01/20/2014: ALT 15 05/06/2014: Magnesium 2.1 06/29/2014: B Natriuretic Peptide 1486.2* 07/19/2014: BUN 21; Creatinine 1.19; Hemoglobin 13.0; Platelets 308.0; Potassium 4.3; Sodium 138    Lipid Panel    Component Value Date/Time   CHOL 129 12/18/2013 1032   TRIG 111.0 12/18/2013 1032   HDL 31.20* 12/18/2013 1032   CHOLHDL 4 12/18/2013 1032   VLDL 22.2 12/18/2013 1032   LDLCALC 76 12/18/2013 1032      Wt Readings from Last 3 Encounters:  08/07/14 163 lb 6.4 oz (74.118 kg)  07/19/14 163 lb 12.8 oz (74.299 kg)  07/01/14 167 lb 8.8 oz (76 kg)       ASSESSMENT AND PLAN:  1. Paroxysmal atrial flutter which has converted back to normal sinus rhythm on higher dose of sotalol 120 mg twice a day 1. ischemic heart disease with ischemic cardiomyopathy. Ejection fraction 20-25% with apical aneurysm by echocardiogram 12/06/13  2. 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  3. past history of ventricular tachycardia. He has had 3 separate VT ablation procedures, most recently in March 2013 by Dr. Rayann Heman  4. hypothyroidism  followed by Dr. Forde Dandy  5. hypercholesterolemia  6. recent admission 01/17/14-01/20/14 for dyspnea felt to  be probable acute bronchitis as well as slight worsening of his chronic systolic heart failure.normal VQ scan 7. Recent emergency room visit on January 16 for acute shortness of breath which responded to IV Lasix. He is now on a higher dose of Lasix 20 mg twice a day. 8. Status post bilateral laparoscopic inguinal hernia repair with postoperative blood loss after resumption of anticoagulation.   Current medicines are reviewed at length with the patient today.  The patient does not have concerns regarding medicines.  The following changes have been made:  no change   No orders of the defined types were placed in this encounter.     Disposition:   FU with Dr. Mare Ferrari in 2 months for office visit CBC lipid panel hepatic function panel and basal metabolic panel. Continue present medication.  Continue regular exercise. Dr. Forde Dandy will be checking his thyroid function studies next month.   Signed, Darlin Coco, MD  08/07/2014 1:29 PM    [facility] [address], [place], [place]  [address] Phone: [phone]; Fax: [phone]

## 2014-08-07 NOTE — Patient Instructions (Signed)
Your physician recommends that you continue on your current medications as directed. Please refer to the Current Medication list given to you today.  Your physician recommends that you schedule a follow-up appointment in: 2 months with fasting labs (LP/BMET/HFP/CBC)

## 2014-08-12 ENCOUNTER — Encounter: Payer: Self-pay | Admitting: Cardiology

## 2014-08-19 ENCOUNTER — Telehealth: Payer: Self-pay | Admitting: Internal Medicine

## 2014-08-19 NOTE — Telephone Encounter (Signed)
I called and spoke with the patient's wife. The patient has had episodes of a-flutter over the last couple of months. He saw Dr. Mare Ferrari on 3/9 and was in NSR at the time. Per the patient's wife, the patient woke up on Saturday morning and Sunday morning in a-flutter. He feels that he his back in NSR today. They were trying to send a transmission for [place] to review, however, his Merlin transmitter is not working appropriately today. They cannot get any of the lights on even after checking connections, plugging in to a different outlet, and pushing the reset button. I reviewed with the device clinic tech and was instructed to have the patient call tech support for [facility]. Tech support # given [phone]. I have advised the patient's wife to call me back after she speaks with [employer] so we can know if the transmitter is working or not. She is agreeable.

## 2014-08-19 NOTE — Telephone Encounter (Signed)
New message      Talk to Nira Conn only Pt is having rhythum problems.  Pt has aflutter.  Also, his equipment to send remote transmissions is not working.

## 2014-08-22 ENCOUNTER — Ambulatory Visit (INDEPENDENT_AMBULATORY_CARE_PROVIDER_SITE_OTHER): Payer: Medicare Other

## 2014-08-22 DIAGNOSIS — I255 Ischemic cardiomyopathy: Secondary | ICD-10-CM

## 2014-08-22 DIAGNOSIS — Z7901 Long term (current) use of anticoagulants: Secondary | ICD-10-CM | POA: Diagnosis not present

## 2014-08-22 LAB — POCT INR: INR: 1.7

## 2014-08-24 ENCOUNTER — Encounter: Payer: Self-pay | Admitting: Internal Medicine

## 2014-08-29 ENCOUNTER — Ambulatory Visit (INDEPENDENT_AMBULATORY_CARE_PROVIDER_SITE_OTHER): Payer: Medicare Other | Admitting: *Deleted

## 2014-08-29 ENCOUNTER — Encounter: Payer: Self-pay | Admitting: *Deleted

## 2014-08-29 ENCOUNTER — Telehealth: Payer: Self-pay | Admitting: *Deleted

## 2014-08-29 DIAGNOSIS — Z95810 Presence of automatic (implantable) cardiac defibrillator: Secondary | ICD-10-CM

## 2014-08-29 DIAGNOSIS — I472 Ventricular tachycardia, unspecified: Secondary | ICD-10-CM

## 2014-08-29 DIAGNOSIS — I5022 Chronic systolic (congestive) heart failure: Secondary | ICD-10-CM

## 2014-08-29 LAB — MDC_IDC_ENUM_SESS_TYPE_INCLINIC: MDC IDC PG SERIAL: 828324

## 2014-08-29 NOTE — Telephone Encounter (Signed)
Spoke with patient- explained that a change needs to be made on his device. He agrees to come to device clinic today for template update. Appt made today for 12noon.

## 2014-08-29 NOTE — Progress Notes (Signed)
EPIC Encounter for ICM Monitoring  Patient Name: [name] is a 74 y.o. male Date: 08/29/2014 Primary Care Physican: Sheela Stack, MD Primary Cardiologist: Mare Ferrari Electrophysiologist: Caryl Comes Dry Weight: 158 lbs  Bi-V pacing: 88%       In the past month, have you:  1. Gained more than 2 pounds in a day or more than 5 pounds in a week? no  2. Had changes in your medications (with verification of current medications)? no  3. Had more shortness of breath than is usual for you? no  4. Limited your activity because of shortness of breath? no  5. Not been able to sleep because of shortness of breath? no  6. Had increased swelling in your feet or ankles? no  7. Had symptoms of dehydration (dizziness, dry mouth, increased thirst, decreased urine output) no  8. Had changes in sodium restriction? no  9. Been compliant with medication? Yes   ICM trend:   Follow-up plan: ICM clinic phone appointment: 09/30/14. I last spoke with the patient in January for [facility] clinic follow up. He had bilateral inguinal hernia repair surgery on 06/20/14. He developed abdominal bleeding about 1 week was re-admitted on 06/27/14 with a Hgb of 7.5 and transfused. He developed a-flutter during his hospitalization. He was seen in follow up by Dr. Mare Ferrari with plans to DCCV the patient, but his INR was sub-therapeutic at the time. He did eventually convert on his own with only intermittent re-occurrence. He is feeling fairly well today. No changes made today.   Copy of note sent to patient's primary care physician, primary cardiologist, and device following physician.  Alvis Lemmings, RN, BSN 08/29/2014 11:29 AM

## 2014-08-29 NOTE — Progress Notes (Signed)
Add on CRTD check in clinic for SVT episode 08/23/14. See PaceArt for full report.   Patient denies SOB, dizziness, chest pain.  Debroah Loop, RN

## 2014-09-05 ENCOUNTER — Ambulatory Visit (INDEPENDENT_AMBULATORY_CARE_PROVIDER_SITE_OTHER): Payer: Medicare Other | Admitting: *Deleted

## 2014-09-05 DIAGNOSIS — I255 Ischemic cardiomyopathy: Secondary | ICD-10-CM | POA: Diagnosis not present

## 2014-09-05 DIAGNOSIS — Z7901 Long term (current) use of anticoagulants: Secondary | ICD-10-CM | POA: Diagnosis not present

## 2014-09-05 LAB — POCT INR: INR: 1.8

## 2014-09-11 ENCOUNTER — Encounter: Payer: Self-pay | Admitting: Internal Medicine

## 2014-09-19 ENCOUNTER — Ambulatory Visit (INDEPENDENT_AMBULATORY_CARE_PROVIDER_SITE_OTHER): Payer: Medicare Other | Admitting: *Deleted

## 2014-09-19 DIAGNOSIS — I255 Ischemic cardiomyopathy: Secondary | ICD-10-CM | POA: Diagnosis not present

## 2014-09-19 DIAGNOSIS — Z7901 Long term (current) use of anticoagulants: Secondary | ICD-10-CM

## 2014-09-19 LAB — POCT INR: INR: 2

## 2014-09-21 ENCOUNTER — Other Ambulatory Visit: Payer: Self-pay | Admitting: Cardiology

## 2014-09-30 ENCOUNTER — Ambulatory Visit (INDEPENDENT_AMBULATORY_CARE_PROVIDER_SITE_OTHER): Payer: Medicare Other | Admitting: *Deleted

## 2014-09-30 DIAGNOSIS — I5022 Chronic systolic (congestive) heart failure: Secondary | ICD-10-CM | POA: Diagnosis not present

## 2014-09-30 DIAGNOSIS — Z95810 Presence of automatic (implantable) cardiac defibrillator: Secondary | ICD-10-CM | POA: Diagnosis not present

## 2014-10-02 ENCOUNTER — Encounter: Payer: Self-pay | Admitting: *Deleted

## 2014-10-02 NOTE — Progress Notes (Signed)
EPIC Encounter for ICM Monitoring  Patient Name: [name] is a 74 y.o. male Date: 10/02/2014 Primary Care Physican: Sheela Stack, MD  Primary Cardiologist: Mare Ferrari Electrophysiologist: Faustino Congress Weight: 161 lbs  Bi-V pacing: >99%       In the past month, have you:  1. Gained more than 2 pounds in a day or more than 5 pounds in a week? no  2. Had changes in your medications (with verification of current medications)? no  3. Had more shortness of breath than is usual for you? no  4. Limited your activity because of shortness of breath? no  5. Not been able to sleep because of shortness of breath? no  6. Had increased swelling in your feet or ankles? no  7. Had symptoms of dehydration (dizziness, dry mouth, increased thirst, decreased urine output) no  8. Had changes in sodium restriction? no  9. Been compliant with medication? Yes   ICM trend:   Follow-up plan: ICM clinic phone appointment: 11/04/14. The patient reports doing well today. No changes made.   Copy of note sent to patient's primary care physician, primary cardiologist, and device following physician.  Alvis Lemmings, RN, BSN 10/02/2014 3:25 PM

## 2014-10-17 ENCOUNTER — Ambulatory Visit (INDEPENDENT_AMBULATORY_CARE_PROVIDER_SITE_OTHER): Payer: Medicare Other | Admitting: Cardiology

## 2014-10-17 ENCOUNTER — Encounter: Payer: Self-pay | Admitting: Cardiology

## 2014-10-17 ENCOUNTER — Ambulatory Visit (INDEPENDENT_AMBULATORY_CARE_PROVIDER_SITE_OTHER): Payer: Medicare Other | Admitting: *Deleted

## 2014-10-17 VITALS — BP 110/58 | HR 70 | Ht 68.0 in | Wt 164.0 lb

## 2014-10-17 DIAGNOSIS — Z79899 Other long term (current) drug therapy: Secondary | ICD-10-CM | POA: Diagnosis not present

## 2014-10-17 DIAGNOSIS — Z7901 Long term (current) use of anticoagulants: Secondary | ICD-10-CM | POA: Diagnosis not present

## 2014-10-17 DIAGNOSIS — I255 Ischemic cardiomyopathy: Secondary | ICD-10-CM | POA: Diagnosis not present

## 2014-10-17 DIAGNOSIS — I5022 Chronic systolic (congestive) heart failure: Secondary | ICD-10-CM

## 2014-10-17 DIAGNOSIS — Z95810 Presence of automatic (implantable) cardiac defibrillator: Secondary | ICD-10-CM

## 2014-10-17 LAB — POCT INR: INR: 2

## 2014-10-17 MED ORDER — SOTALOL HCL 120 MG PO TABS: 120.0000 mg | ORAL_TABLET | Freq: Two times a day (BID) | ORAL | Status: DC

## 2014-10-17 MED ORDER — MEXILETINE HCL 150 MG PO CAPS: 150.0000 mg | ORAL_CAPSULE | Freq: Three times a day (TID) | ORAL | Status: DC

## 2014-10-17 MED ORDER — WARFARIN SODIUM 2.5 MG PO TABS: ORAL_TABLET | ORAL | Status: DC

## 2014-10-17 MED ORDER — FUROSEMIDE 20 MG PO TABS
20.0000 mg | ORAL_TABLET | Freq: Two times a day (BID) | ORAL | Status: DC
Start: 2014-10-17 — End: 2015-02-05

## 2014-10-17 MED ORDER — CARVEDILOL 12.5 MG PO TABS: 12.5000 mg | ORAL_TABLET | Freq: Two times a day (BID) | ORAL | Status: AC

## 2014-10-17 MED ORDER — ROSUVASTATIN CALCIUM 5 MG PO TABS: 5.0000 mg | ORAL_TABLET | ORAL | Status: DC

## 2014-10-17 MED ORDER — LOSARTAN POTASSIUM 50 MG PO TABS: 25.0000 mg | ORAL_TABLET | Freq: Every day | ORAL | Status: DC

## 2014-10-17 NOTE — Progress Notes (Signed)
Cardiology Office Note   Date:  10/17/2014   ID:  [name], DOB 08-02-1940, MRN [id number]  PCP:  Sheela Stack, MD  Cardiologist: Darlin Coco MD  No chief complaint on file.     History of Present Illness: [name] is a 74 y.o. male who presents for routine follow-up office visit.    This pleasant 74 year old gentleman is seen for a post hospital office visit.He was admitted on 06/27/14 and discharged on 07/05/14. He was admitted because of progressive weakness and postoperative blood loss with hemoglobin of 7.5. A week or earlier on 06/20/14 he had undergone laparoscopic repair of inguinal hernias. He has been on chronic anticoagulation. He did well initially postoperatively but then after anticoagulation was resumed he developed postoperative blood loss which required 2 units of packed cells. While in the hospital he went into atrial flutter. On the day of discharge, 07/05/14 he underwent direct current cardioversion prior to discharge. The cardioversion was successful and he went home in normal sinus rhythm. He had a subsequent run of atrial flutter lasting several weeks after which it converted on its own back to normal sinus rhythm.  He has been in normal sinus rhythm since then.  He has a history of a remote anterior wall myocardial infarction. He has an ischemic cardiomyopathy. He has a left ventricular apical aneurysm. He has had a defibrillator for ventricular tachycardia. He has been followed closely by Dr. Caryl Comes and Dr. Rayann Heman. 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 Subsequently he had recurrent ventricular tachycardia and was transferred to [facility] for incessant VT and underwent a second ablation procedure complicated by pericardial perforation and pericarditis. He underwent a third catheter VT ablation procedure in March 2013 by Dr. Rayann Heman and since then has felt well. He has not been aware of any  palpitations. He has not been aware of any shocks from his ICD. He does have a known ischemic cardiomyopathy with ejection fraction of 25-30% by echocardiogram in February 2012. In autumn 2014 he had some problems with fluid overload [place] and responded to increased diuretic and to dietary salt restriction. At the time of this prior admission his echocardiogram on 12/06/13 showed an ejection fraction of 20-25% and there was an apical aneurysm. His current antiarrhythmic therapy consisting of mexiletine and of sotalol 120 mg twice a day.  He has been walking more than a mile a day for exercise.  He is not having any exertional chest pain.  No dizziness or syncope.  No palpitations.  He has not had any shocks from his ICD.  Past Medical History  Diagnosis Date  . CAD (coronary artery disease) 1999    Remote anterolater and apical MI, cath 2011 patent stents RCA/LCx  LAD w/o obstruction  . Cardiomyopathy, ischemic     EF 20-25%  echo 2012  . Chronic systolic congestive heart failure   . ICD (implantable cardiac defibrillator) in place     [name]  . 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 Abdominal aortic aneurysm 2009    Repaired with endovascular stent  . PVD (peripheral vascular disease)     Has occluded innominate vein  . Myocardial infarction   . Hypertension   . Ventricular tachycardia     recurrent slow VT at 100-110 bpm,  Rx mexilitene/sotalol  . Left bundle branch block   . Systolic CHF, chronic   . Arthritis   .  Gynecomastia     2/2 spironolactone  . Atrial fibrillation   . Pericarditis     due to perforation  . Inguinal hernia recurrent bilateral   . GERD (gastroesophageal reflux disease)   . Anemia 05/2014  . Cardiac arrest 12/05/2013  . Acute on chronic systolic heart failure 02/03/1883  . THYROTOXICOSIS 02/07/2009    Qualifier: Diagnosis of  By: [name]    . SVT (supraventricular tachycardia) 12/05/2013    Past Surgical History    Procedure Laterality Date  . Icd  Feb 2012    Change out with LV lead placed with tunneling from the right  to the left side  . Endovascular stent insertion      for AAA  . Cardiac catheterization      x2  . Pacemaker insertion    . Ventricular ablation surgery      s/p prior EPS and ablation at [facility] 9/12, [facility] 11/12, and most recent [facility] 08/23/11  . Tonsillectomy    . V-tach ablation N/A 08/24/2011    Procedure: V-TACH ABLATION;  Surgeon: Thompson Grayer, MD;  Location: [facility] CATH LAB;  Service: Cardiovascular;  Laterality: N/A;  . Inguinal hernia repair Bilateral 06/20/2014    Procedure: LAPAROSCOPIC BILATERAL INGUINAL HERNIA REPAIR;  Surgeon: Michael Boston, MD;  Location: [place];  Service: General;  Laterality: Bilateral;  . Insertion of mesh N/A 06/20/2014    Procedure: INSERTION OF MESH;  Surgeon: Michael Boston, MD;  Location: [place];  Service: General;  Laterality: N/A;  . Cardioversion N/A 07/05/2014    Procedure: CARDIOVERSION;  Surgeon: Larey Dresser, MD;  Location: [place];  Service: Cardiovascular;  Laterality: N/A;     Current Outpatient Prescriptions  Medication Sig Dispense Refill  . acetaminophen (TYLENOL) 500 MG tablet Take 1,000 mg by mouth every 6 (six) hours as needed for moderate pain.    [name] ALPRAZolam (XANAX) 0.25 MG tablet Take 0.5-1 tablets (0.125-0.25 mg total) by mouth 4 (four) times daily. (Patient taking differently: Take 0.125-0.25 mg by mouth See admin instructions. Takes 1/2 tab around 2AM, takes 1 tab at 6AM,  Takes 1/2 tab at 1030AM, takes 1 tab at [facility], takes 1/2 at [facility], and takes 1/2 tab at 9PM) 360 tablet 1  . Ascorbic Acid (VITAMIN C) 500 MG tablet Take 500 mg by mouth daily.      . carvedilol (COREG) 12.5 MG tablet Take 1 tablet (12.5 mg total) by mouth 2 (two) times daily. 180 tablet 3  . Docusate Calcium (STOOL SOFTENER PO) Take 1 capsule by mouth daily.    . furosemide (LASIX) 20 MG tablet Take 1 tablet (20 mg total) by mouth 2 (two) times  daily. 180 tablet 3  . levothyroxine (SYNTHROID, LEVOTHROID) 88 MCG tablet Take 88-132 mcg by mouth daily. Take 1.5 tablets on Friday and 1 tablet the rest of the week.    . loratadine (CLARITIN) 10 MG tablet Take 10 mg by mouth daily.    [name] losartan (COZAAR) 50 MG tablet Take 0.5 tablets (25 mg total) by mouth daily. 45 tablet 3  . meclizine (ANTIVERT) 25 MG tablet Take 25 mg by mouth 3 (three) times daily.    [name] mexiletine (MEXITIL) 150 MG capsule Take 1 capsule (150 mg total) by mouth 3 (three) times daily. 270 capsule 3  . Multiple Vitamin (MULTIVITAMIN WITH MINERALS) TABS tablet Take 1 tablet by mouth daily.    . Omega-3 Fatty Acids (FISH OIL) 1000 MG CAPS Take 1,000 mg by  mouth daily.     . ranitidine (ZANTAC) 150 MG tablet Take 150 mg by mouth every evening.    . rosuvastatin (CRESTOR) 5 MG tablet Take 1 tablet (5 mg total) by mouth 2 (two) times a week. Every Tuesday and Saturday 30 tablet 3  . sotalol (BETAPACE) 120 MG tablet Take 1 tablet (120 mg total) by mouth 2 (two) times daily. 180 tablet 3  . warfarin (COUMADIN) 2.5 MG tablet Take as directed by Coumadin clinic 120 tablet 1   No current facility-administered medications for this visit.    Allergies:   Iohexol and Prednisone    Social History:  The patient  reports that he quit smoking about 20 years ago. He has never used smokeless tobacco. He reports that he drinks alcohol. He reports that he does not use illicit drugs.   Family History:  The patient's family history includes Heart attack in his father and mother; Heart disease in his father, mother, and sister; Hyperlipidemia in his father, mother, and sister; Hypertension in his brother, father, and mother; Stroke in his mother.    ROS:  Please see the history of present illness.   Otherwise, review of systems are positive for none.   All other systems are reviewed and negative.    PHYSICAL EXAM: VS:  BP 110/58 mmHg  Pulse 70  Ht 5\' 8"  (1.727 m)  Wt 164 lb (74.39 kg)   BMI 24.94 kg/m2 , BMI Body mass index is 24.94 kg/(m^2). GEN: Well nourished, well developed, in no acute distress HEENT: normal Neck: no JVD, carotid bruits, or masses Cardiac: RRR; no murmurs, rubs, or gallops,no edema  Respiratory:  clear to auscultation bilaterally, normal work of breathing GI: soft, nontender, nondistended, + BS MS: no deformity or atrophy Skin: warm and dry, no rash Neuro:  Strength and sensation are intact Psych: euthymic mood, full affect   EKG:  EKG is not ordered today.   Recent Labs: 12/18/2013: TSH 0.85 01/17/2014: Pro B Natriuretic peptide (BNP) 3228.0* 01/20/2014: ALT 15 05/06/2014: Magnesium 2.1 06/29/2014: B Natriuretic Peptide 1486.2* 07/19/2014: BUN 21; Creatinine 1.19; Hemoglobin 13.0; Platelets 308.0; Potassium 4.3; Sodium 138    Lipid Panel    Component Value Date/Time   CHOL 129 12/18/2013 1032   TRIG 111.0 12/18/2013 1032   HDL 31.20* 12/18/2013 1032   CHOLHDL 4 12/18/2013 1032   VLDL 22.2 12/18/2013 1032   LDLCALC 76 12/18/2013 1032      Wt Readings from Last 3 Encounters:  10/17/14 164 lb (74.39 kg)  08/07/14 163 lb 6.4 oz (74.118 kg)  07/19/14 163 lb 12.8 oz (74.299 kg)         ASSESSMENT AND PLAN:  1. Paroxysmal atrial flutter which has converted back to normal sinus rhythm on higher dose of sotalol 120 mg twice a day 1. ischemic heart disease with ischemic cardiomyopathy. Ejection fraction 20-25% with apical aneurysm by echocardiogram 12/06/13  2. 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  3. past history of ventricular tachycardia. He has had 3 separate VT ablation procedures, most recently in March 2013 by Dr. Rayann Heman  4. hypothyroidism followed by Dr. Forde Dandy  5. hypercholesterolemia  6. recent admission 01/17/14-01/20/14 for dyspnea felt to be probable acute bronchitis as well as slight worsening of his chronic  systolic heart failure.normal VQ scan 7. Recent emergency room visit on January 16 for acute shortness of breath which responded to IV Lasix. He is  now on a higher dose of Lasix 20 mg twice a day. 8. Status post bilateral laparoscopic inguinal hernia repair with postoperative blood loss after resumption of anticoagulation.   Current medicines are reviewed at length with the patient today. The patient does not have concerns regarding medicines.  The following changes have been made: no change   No orders of the defined types were placed in this encounter.    .   Current medicines are reviewed at length with the patient today.  The patient does not have concerns regarding medicines.  The following changes have been made:  no change  Labs/ tests ordered today include:   Orders Placed This Encounter  Procedures  . Basic metabolic panel    Disposition: Return in 2 weeks for a basal metabolic panel. Turn in 2 months for office visit EKG and basal metabolic panel  Signed, Darlin Coco MD 10/17/2014 1:38 PM    [facility] [address], [place], [place]  [address] Phone: [phone]; Fax: [phone]

## 2014-10-17 NOTE — Patient Instructions (Signed)
Medication Instructions:  Your physician recommends that you continue on your current medications as directed. Please refer to the Current Medication list given to you today.  Labwork: bmet at next coumadin check  Testing/Procedures: none  Follow-Up: Your physician recommends that you schedule a follow-up appointment in:

## 2014-10-18 ENCOUNTER — Telehealth: Payer: Self-pay | Admitting: Internal Medicine

## 2014-10-18 NOTE — Telephone Encounter (Signed)
I spoke with the patient and rescheduled his transmission date for 10/31/14.

## 2014-10-18 NOTE — Telephone Encounter (Signed)
New problem    Pt want to change his transmission date from 6.6.16 to 6.2.16 because he is going out of town. And you can call him with results after he transmit at [phone].

## 2014-10-31 ENCOUNTER — Ambulatory Visit (INDEPENDENT_AMBULATORY_CARE_PROVIDER_SITE_OTHER): Payer: Medicare Other

## 2014-10-31 ENCOUNTER — Encounter: Payer: Self-pay | Admitting: *Deleted

## 2014-10-31 ENCOUNTER — Ambulatory Visit (INDEPENDENT_AMBULATORY_CARE_PROVIDER_SITE_OTHER): Payer: Medicare Other | Admitting: *Deleted

## 2014-10-31 ENCOUNTER — Other Ambulatory Visit (INDEPENDENT_AMBULATORY_CARE_PROVIDER_SITE_OTHER): Payer: Medicare Other | Admitting: *Deleted

## 2014-10-31 ENCOUNTER — Encounter: Payer: Self-pay | Admitting: Internal Medicine

## 2014-10-31 DIAGNOSIS — I255 Ischemic cardiomyopathy: Secondary | ICD-10-CM

## 2014-10-31 DIAGNOSIS — Z79899 Other long term (current) drug therapy: Secondary | ICD-10-CM

## 2014-10-31 DIAGNOSIS — I5022 Chronic systolic (congestive) heart failure: Secondary | ICD-10-CM

## 2014-10-31 DIAGNOSIS — Z95810 Presence of automatic (implantable) cardiac defibrillator: Secondary | ICD-10-CM | POA: Diagnosis not present

## 2014-10-31 DIAGNOSIS — Z7901 Long term (current) use of anticoagulants: Secondary | ICD-10-CM

## 2014-10-31 LAB — BASIC METABOLIC PANEL
BUN: 14 mg/dL (ref 6–23)
CO2: 32 meq/L (ref 19–32)
Calcium: 9.5 mg/dL (ref 8.4–10.5)
Chloride: 98 mEq/L (ref 96–112)
Creatinine, Ser: 1.26 mg/dL (ref 0.40–1.50)
GFR: 59.62 mL/min — AB (ref >60.00)
GLUCOSE: 88 mg/dL (ref 70–99)
Potassium: 4.1 mEq/L (ref 3.5–5.1)
Sodium: 134 mEq/L — ABNORMAL LOW (ref 135–145)

## 2014-10-31 LAB — POCT INR: INR: 2.6

## 2014-10-31 NOTE — Progress Notes (Signed)
Remote ICD transmission.   

## 2014-10-31 NOTE — Progress Notes (Signed)
EPIC Encounter for ICM Monitoring  Patient Name: [name] is a 74 y.o. male Date: 10/31/2014 Primary Care Physican: Sheela Stack, MD Primary Cardiologist: Mare Ferrari Electrophysiologist: Caryl Comes Dry Weight: 161 lbs  Bi-V pacing: >99%       In the past month, have you:  1. Gained more than 2 pounds in a day or more than 5 pounds in a week? no  2. Had changes in your medications (with verification of current medications)? no  3. Had more shortness of breath than is usual for you? no  4. Limited your activity because of shortness of breath? no  5. Not been able to sleep because of shortness of breath? no  6. Had increased swelling in your feet or ankles? no  7. Had symptoms of dehydration (dizziness, dry mouth, increased thirst, decreased urine output) no  8. Had changes in sodium restriction? no  9. Been compliant with medication? Yes   ICM trend:   Follow-up plan: ICM clinic phone appointment: 12/05/14. Corvue readings are showing slight elevation over the last week. The patient denies any change in his weight/ symptoms. He is feeling good today and back to walking. His INR is back in range today at 2.6. His is planning on traveling to the beach next week! No changes made today.   Copy of note sent to patient's primary care physician, primary cardiologist, and device following physician.  Alvis Lemmings, RN, BSN 10/31/2014 10:04 AM

## 2014-10-31 NOTE — Progress Notes (Signed)
Quick Note:  Please report to patient. The recent labs are stable. Continue same medication and careful diet. ______ 

## 2014-11-05 LAB — CUP PACEART REMOTE DEVICE CHECK
Battery Remaining Longevity: 13 mo
Battery Voltage: 2.78 V
Brady Statistic AP VS Percent: 1 %
Brady Statistic AS VP Percent: 1 %
Brady Statistic AS VS Percent: 1 %
Brady Statistic RA Percent Paced: 99 %
HIGH POWER IMPEDANCE MEASURED VALUE: 50 Ohm
HighPow Impedance: 48 Ohm
Lead Channel Impedance Value: 330 Ohm
Lead Channel Pacing Threshold Amplitude: 1 V
Lead Channel Pacing Threshold Amplitude: 1.25 V
Lead Channel Pacing Threshold Pulse Width: 0.5 ms
Lead Channel Pacing Threshold Pulse Width: 0.5 ms
Lead Channel Pacing Threshold Pulse Width: 1 ms
Lead Channel Sensing Intrinsic Amplitude: 11.8 mV
Lead Channel Setting Pacing Amplitude: 2.5 V
Lead Channel Setting Pacing Amplitude: 2.5 V
Lead Channel Setting Pacing Amplitude: 2.5 V
Lead Channel Setting Pacing Pulse Width: 0.5 ms
Lead Channel Setting Sensing Sensitivity: 0.5 mV
MDC IDC MSMT BATTERY REMAINING PERCENTAGE: 21 %
MDC IDC MSMT LEADCHNL LV IMPEDANCE VALUE: 930 Ohm
MDC IDC MSMT LEADCHNL RA IMPEDANCE VALUE: 380 Ohm
MDC IDC MSMT LEADCHNL RA SENSING INTR AMPL: 2 mV
MDC IDC MSMT LEADCHNL RV PACING THRESHOLD AMPLITUDE: 1 V
MDC IDC PG SERIAL: 828324
MDC IDC SESS DTM: 20160602111228
MDC IDC SET LEADCHNL LV PACING PULSEWIDTH: 0.5 ms
MDC IDC SET ZONE DETECTION INTERVAL: 570 ms
MDC IDC STAT BRADY AP VP PERCENT: 99 %
Zone Setting Detection Interval: 310 ms

## 2014-11-11 ENCOUNTER — Encounter: Payer: Self-pay | Admitting: Internal Medicine

## 2014-11-12 ENCOUNTER — Telehealth: Payer: Self-pay | Admitting: *Deleted

## 2014-11-12 NOTE — Telephone Encounter (Signed)
Called patient regarding ATP treated episodes. Patient states that he noticed a "fast heartbeat", but denies any ShOB, CP, syncope or presyncope. EGMs are questionable for AT. Will discuss w/SK. Will call patient w/ any further recommendations.

## 2014-11-15 ENCOUNTER — Telehealth: Payer: Self-pay | Admitting: *Deleted

## 2014-11-15 ENCOUNTER — Encounter: Payer: Self-pay | Admitting: Internal Medicine

## 2014-11-15 NOTE — Telephone Encounter (Signed)
Called patient about AFL alert via [place]. Patient states that he can feel an increase in his HR at times, but denies any ShOB or fatigue today. Will inform Dr.Klein about episodes and notify pt w/ recommendations.

## 2014-11-15 NOTE — Telephone Encounter (Signed)
Alert placed in red folder//sss

## 2014-11-18 ENCOUNTER — Telehealth: Payer: Self-pay | Admitting: *Deleted

## 2014-11-18 NOTE — Telephone Encounter (Signed)
PHARM REQUESTING ALPARZOLAM .25 MG.Marland KitchenMarland Kitchen

## 2014-11-19 ENCOUNTER — Telehealth: Payer: Self-pay | Admitting: Internal Medicine

## 2014-11-19 ENCOUNTER — Encounter: Payer: Self-pay | Admitting: Internal Medicine

## 2014-11-19 DIAGNOSIS — Z01812 Encounter for preprocedural laboratory examination: Secondary | ICD-10-CM

## 2014-11-19 DIAGNOSIS — I4892 Unspecified atrial flutter: Secondary | ICD-10-CM

## 2014-11-19 MED ORDER — ALPRAZOLAM 0.25 MG PO TABS: ORAL_TABLET | ORAL | Status: DC

## 2014-11-19 NOTE — Telephone Encounter (Signed)
Will [facility] to [facility] after  Dr. Mare Ferrari signs Rx tomorrow

## 2014-11-19 NOTE — Telephone Encounter (Signed)
New problem   Pt is complaining of irregular heart beat that started last Monday. Pt need to speak to nurse.

## 2014-11-19 NOTE — Telephone Encounter (Signed)
Patient and his wife both on the phone when I called.  He asked his wife to explain recent issues.  She proceeded to tell me that patient first went "out of rhythm" last Monday, the 13th.  They sent transmission.  On 6/17 she states our office called informing them remote picked up A Flutter event at 3:30 a.m. that morning. She states someone called her that evening at 5:30 pm, but they missed that call.  At 6pm she called and left message with answering service and has not heard from anyone since then. Describes heart rate going up late in the day mostly, last 5-10 minutes, then returns to normal.  He is experiencing SOB with these occurences. Also reports being able to feel fluttering and sometimes chest will feel heavy during episode/s.  He denies any other symptoms. Asked them to send another transmission and I would review with Dr. Caryl Comes. Wife is very grateful for helping and responding.

## 2014-11-19 NOTE — Telephone Encounter (Signed)
Dr. Caryl Comes spoke with patient and discussed transmission findings.  He advised patient to come to office tomorrow for INR check and schedule DCCV for Thursday/Friday. Patient is aware I will contact him in the morning to arrange these.

## 2014-11-20 ENCOUNTER — Ambulatory Visit (INDEPENDENT_AMBULATORY_CARE_PROVIDER_SITE_OTHER): Payer: Medicare Other | Admitting: *Deleted

## 2014-11-20 ENCOUNTER — Other Ambulatory Visit (INDEPENDENT_AMBULATORY_CARE_PROVIDER_SITE_OTHER): Payer: Medicare Other | Admitting: *Deleted

## 2014-11-20 DIAGNOSIS — Z7901 Long term (current) use of anticoagulants: Secondary | ICD-10-CM | POA: Diagnosis not present

## 2014-11-20 DIAGNOSIS — Z01812 Encounter for preprocedural laboratory examination: Secondary | ICD-10-CM

## 2014-11-20 DIAGNOSIS — I255 Ischemic cardiomyopathy: Secondary | ICD-10-CM | POA: Diagnosis not present

## 2014-11-20 DIAGNOSIS — I4892 Unspecified atrial flutter: Secondary | ICD-10-CM

## 2014-11-20 LAB — POCT INR: INR: 3.3

## 2014-11-20 LAB — BASIC METABOLIC PANEL
BUN: 17 mg/dL (ref 6–23)
CALCIUM: 9.6 mg/dL (ref 8.4–10.5)
CO2: 31 meq/L (ref 19–32)
CREATININE: 1.87 mg/dL — AB (ref 0.40–1.50)
Chloride: 104 mEq/L (ref 96–112)
GFR: 37.79 mL/min — AB (ref >60.00)
Glucose, Bld: 74 mg/dL (ref 70–99)
Potassium: 4.6 mEq/L (ref 3.5–5.1)
Sodium: 138 mEq/L (ref 135–145)

## 2014-11-20 LAB — CBC WITH DIFFERENTIAL/PLATELET
BASOS ABS: 0.1 10*3/uL (ref 0.0–0.1)
Basophils Relative: 0.9 % (ref 0.0–3.0)
EOS ABS: 0.1 10*3/uL (ref 0.0–0.7)
Eosinophils Relative: 1.8 % (ref 0.0–5.0)
HCT: 40 % (ref 39.0–52.0)
Hemoglobin: 13.3 g/dL (ref 13.0–17.0)
LYMPHS PCT: 27.4 % (ref 12.0–46.0)
Lymphs Abs: 1.7 10*3/uL (ref 0.7–4.0)
MCHC: 33.3 g/dL (ref 30.0–36.0)
MCV: 93.3 fl (ref 78.0–100.0)
MONOS PCT: 14.2 % — AB (ref 3.0–12.0)
Monocytes Absolute: 0.9 10*3/uL (ref 0.1–1.0)
NEUTROS ABS: 3.5 10*3/uL (ref 1.4–7.7)
NEUTROS PCT: 55.7 % (ref 43.0–77.0)
Platelets: 249 10*3/uL (ref 150.0–400.0)
RBC: 4.29 Mil/uL (ref 4.22–5.81)
RDW: 13.6 % (ref 11.5–15.5)
WBC: 6.3 10*3/uL (ref 4.0–10.5)

## 2014-11-20 NOTE — Telephone Encounter (Signed)
Scheduled DCCV for Friday 6/24. They will come by office today for pre procedure labs and INR check. Reviewed instructions with patient and his wife:  NPO after MN Thursday, may take medications Friday morning with small amount of water, no Lasix.  Need someone to drive him home. Patient and his wife verbalized understanding and agreeable to plan.

## 2014-11-22 ENCOUNTER — Ambulatory Visit (HOSPITAL_COMMUNITY): Payer: Medicare Other | Admitting: Anesthesiology

## 2014-11-22 ENCOUNTER — Encounter (HOSPITAL_COMMUNITY)
Admission: RE | Disposition: A | Discharge status: Home / Self Care | Payer: Self-pay | Source: Ambulatory Visit | Attending: Cardiovascular Disease

## 2014-11-22 ENCOUNTER — Ambulatory Visit (HOSPITAL_COMMUNITY)
Admission: RE | Admit: 2014-11-22 | Discharge: 2014-11-22 | Disposition: A | Discharge status: Home / Self Care | Payer: Medicare Other | Source: Ambulatory Visit | Attending: Cardiovascular Disease | Admitting: Cardiovascular Disease

## 2014-11-22 ENCOUNTER — Telehealth: Payer: Self-pay | Admitting: Internal Medicine

## 2014-11-22 ENCOUNTER — Encounter (HOSPITAL_COMMUNITY): Payer: Self-pay

## 2014-11-22 DIAGNOSIS — Z87891 Personal history of nicotine dependence: Secondary | ICD-10-CM | POA: Diagnosis not present

## 2014-11-22 DIAGNOSIS — Z7901 Long term (current) use of anticoagulants: Secondary | ICD-10-CM | POA: Insufficient documentation

## 2014-11-22 DIAGNOSIS — I252 Old myocardial infarction: Secondary | ICD-10-CM | POA: Diagnosis not present

## 2014-11-22 DIAGNOSIS — I2510 Atherosclerotic heart disease of native coronary artery without angina pectoris: Secondary | ICD-10-CM | POA: Insufficient documentation

## 2014-11-22 DIAGNOSIS — I481 Persistent atrial fibrillation: Secondary | ICD-10-CM | POA: Insufficient documentation

## 2014-11-22 DIAGNOSIS — Z95810 Presence of automatic (implantable) cardiac defibrillator: Secondary | ICD-10-CM | POA: Insufficient documentation

## 2014-11-22 DIAGNOSIS — I255 Ischemic cardiomyopathy: Secondary | ICD-10-CM | POA: Insufficient documentation

## 2014-11-22 DIAGNOSIS — K219 Gastro-esophageal reflux disease without esophagitis: Secondary | ICD-10-CM | POA: Diagnosis not present

## 2014-11-22 DIAGNOSIS — I10 Essential (primary) hypertension: Secondary | ICD-10-CM | POA: Insufficient documentation

## 2014-11-22 DIAGNOSIS — I5023 Acute on chronic systolic (congestive) heart failure: Secondary | ICD-10-CM | POA: Diagnosis not present

## 2014-11-22 DIAGNOSIS — M1990 Unspecified osteoarthritis, unspecified site: Secondary | ICD-10-CM | POA: Insufficient documentation

## 2014-11-22 DIAGNOSIS — I4891 Unspecified atrial fibrillation: Secondary | ICD-10-CM

## 2014-11-22 DIAGNOSIS — I739 Peripheral vascular disease, unspecified: Secondary | ICD-10-CM | POA: Diagnosis not present

## 2014-11-22 DIAGNOSIS — Z79899 Other long term (current) drug therapy: Secondary | ICD-10-CM | POA: Diagnosis not present

## 2014-11-22 DIAGNOSIS — E0580 Other thyrotoxicosis without thyrotoxic crisis or storm: Secondary | ICD-10-CM | POA: Insufficient documentation

## 2014-11-22 DIAGNOSIS — I4892 Unspecified atrial flutter: Secondary | ICD-10-CM | POA: Diagnosis present

## 2014-11-22 DIAGNOSIS — Z9889 Other specified postprocedural states: Secondary | ICD-10-CM | POA: Insufficient documentation

## 2014-11-22 DIAGNOSIS — N289 Disorder of kidney and ureter, unspecified: Secondary | ICD-10-CM | POA: Insufficient documentation

## 2014-11-22 HISTORY — PX: CARDIOVERSION: SHX1299

## 2014-11-22 SURGERY — CARDIOVERSION
Anesthesia: General

## 2014-11-22 MED ORDER — PHENYLEPHRINE HCL 10 MG/ML IJ SOLN
INTRAMUSCULAR | Status: DC | PRN
Start: 2014-11-22 — End: 2014-11-22
  Administered 2014-11-22 (×2): 40 ug via INTRAVENOUS

## 2014-11-22 MED ORDER — SODIUM CHLORIDE 0.9 % IV SOLN
INTRAVENOUS | Status: DC | PRN
  Administered 2014-11-22: 10:00:00 via INTRAVENOUS

## 2014-11-22 NOTE — Discharge Instructions (Signed)

## 2014-11-22 NOTE — Anesthesia Postprocedure Evaluation (Signed)
Anesthesia Post Note  Patient: [name]  Procedure(s) Performed: Procedure(s) (LRB): CARDIOVERSION (N/A)  Anesthesia type: general  Patient location: PACU  Post pain: Pain level controlled  Post assessment: Patient's Cardiovascular Status Stable  Last Vitals:  Filed Vitals:   11/22/14 1112  BP: 100/55  Pulse: 70  Resp: 18    Post vital signs: Reviewed and stable  Level of consciousness: sedated  Complications: No apparent anesthesia complications

## 2014-11-22 NOTE — Interval H&P Note (Signed)
History and Physical Interval Note:  11/22/2014 9:52 AM  [name]  has presented today for surgery, with the diagnosis of AFLUTTER  The various methods of treatment have been discussed with the patient and family. After consideration of risks, benefits and other options for treatment, the patient has consented to  Procedure(s): CARDIOVERSION (N/A) as a surgical intervention .  The patient's history has been reviewed, patient examined, no change in status, stable for surgery.  I have reviewed the patient's chart and labs.  Questions were answered to the patient's satisfaction.     Amayia Ciano, Wonda Cheng

## 2014-11-22 NOTE — Interval H&P Note (Signed)
History and Physical Interval Note:  11/22/2014 1:10 PM  [name]  has presented today for surgery, with the diagnosis of AFLUTTER  The various methods of treatment have been discussed with the patient and family. After consideration of risks, benefits and other options for treatment, the patient has consented to  Procedure(s): CARDIOVERSION (N/A) as a surgical intervention .  The patient's history has been reviewed, patient examined, no change in status, stable for surgery.  I have reviewed the patient's chart and labs.  Questions were answered to the patient's satisfaction.     Jami Ohlin, Wonda Cheng

## 2014-11-22 NOTE — Transfer of Care (Signed)
Immediate Anesthesia Transfer of Care Note  Patient: [name]  Procedure(s) Performed: Procedure(s): CARDIOVERSION (N/A)  Patient Location: PACU and Endoscopy Unit  Anesthesia Type:MAC  Level of Consciousness: awake, alert , oriented and patient cooperative  Airway & Oxygen Therapy: Patient Spontanous Breathing and Patient connected to nasal cannula oxygen  Post-op Assessment: Report given to RN, Post -op Vital signs reviewed and stable and Patient moving all extremities  Post vital signs: Reviewed and stable  Last Vitals:  Filed Vitals:   11/22/14 0933  BP: 124/73  Pulse: 70  Resp: 16    Complications: No apparent anesthesia complications

## 2014-11-22 NOTE — Anesthesia Preprocedure Evaluation (Addendum)
Anesthesia Evaluation  Patient identified by MRN, date of birth, ID band Patient awake    Reviewed: Allergy & Precautions, NPO status , Patient's Chart, lab work & pertinent test results  History of Anesthesia Complications (+) history of anesthetic complications  Airway Mallampati: II  TM Distance: >3 FB Neck ROM: Full    Dental  (+) Teeth Intact, Caps, Dental Advisory Given   Pulmonary former smoker,    Pulmonary exam normal       Cardiovascular hypertension, + CAD, + Past MI, + Peripheral Vascular Disease and +CHF + dysrhythmias Atrial Fibrillation Rhythm:Irregular Rate:Tachycardia     Neuro/Psych negative neurological ROS  negative psych ROS   GI/Hepatic Neg liver ROS, GERD-  ,  Endo/Other  Hyperthyroidism   Renal/GU Renal InsufficiencyRenal disease     Musculoskeletal   Abdominal   Peds  Hematology   Anesthesia Other Findings   Reproductive/Obstetrics                            Anesthesia Physical Anesthesia Plan  ASA: III  Anesthesia Plan: General   Post-op Pain Management:    Induction: Intravenous  Airway Management Planned: Mask  Additional Equipment:   Intra-op Plan:   Post-operative Plan:   Informed Consent: I have reviewed the patients History and Physical, chart, labs and discussed the procedure including the risks, benefits and alternatives for the proposed anesthesia with the patient or authorized representative who has indicated his/her understanding and acceptance.   Dental advisory given  Plan Discussed with: CRNA and Anesthesiologist  Anesthesia Plan Comments:         Anesthesia Quick Evaluation

## 2014-11-22 NOTE — Telephone Encounter (Signed)
Advised ok to follow up with Dr Mare Ferrari next month as scheduled. Patient is agreeable to plan.

## 2014-11-22 NOTE — H&P (Signed)
[name] is a 74 y.o. male who presents for routine follow-up office visit.    This pleasant 74 year old gentleman is seen for a post hospital office visit.He was admitted on 06/27/14 and discharged on 07/05/14. He was admitted because of progressive weakness and postoperative blood loss with hemoglobin of 7.5. A week or earlier on 06/20/14 he had undergone laparoscopic repair of inguinal hernias. He has been on chronic anticoagulation. He did well initially postoperatively but then after anticoagulation was resumed he developed postoperative blood loss which required 2 units of packed cells. While in the hospital he went into atrial flutter. On the day of discharge, 07/05/14 he underwent direct current cardioversion prior to discharge. The cardioversion was successful and he went home in normal sinus rhythm. He had a subsequent run of atrial flutter lasting several weeks after which it converted on its own back to normal sinus rhythm. He has been in normal sinus rhythm since then.  He has a history of a remote anterior wall myocardial infarction. He has an ischemic cardiomyopathy. He has a left ventricular apical aneurysm. He has had a defibrillator for ventricular tachycardia. He has been followed closely by Dr. Caryl Comes and Dr. Rayann Heman. 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 Subsequently he had recurrent ventricular tachycardia and was transferred to [facility] for incessant VT and underwent a second ablation procedure complicated by pericardial perforation and pericarditis. He underwent a third catheter VT ablation procedure in March 2013 by Dr. Rayann Heman and since then has felt well. He has not been aware of any palpitations. He has not been aware of any shocks from his ICD. He does have a known ischemic cardiomyopathy with ejection fraction of 25-30% by echocardiogram in February 2012. In autumn 2014 he had some problems with fluid overload [place] to increased diuretic and to dietary salt restriction. At the time of this prior admission his echocardiogram on 12/06/13 showed an ejection fraction of 20-25% and there was an apical aneurysm. His current antiarrhythmic therapy consisting of mexiletine and of sotalol 120 mg twice a day.  He has been walking more than a mile a day for exercise. He is not having any exertional chest pain. No dizziness or syncope. No palpitations. He has not had any shocks from his ICD.  Past Medical History  Diagnosis Date  . CAD (coronary artery disease) 1999    Remote anterolater and apical MI, cath 2011 patent stents RCA/LCx LAD w/o obstruction  . Cardiomyopathy, ischemic     EF 20-25% echo 2012  . Chronic systolic congestive heart failure   . ICD (implantable cardiac defibrillator) in place     [place] [facility]  . 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 Abdominal aortic aneurysm 2009    Repaired with endovascular stent  . PVD (peripheral vascular disease)     Has occluded innominate vein  . Myocardial infarction   . Hypertension   . Ventricular tachycardia     recurrent slow VT at 100-110 bpm, Rx mexilitene/sotalol  . Left bundle branch block   . Systolic CHF, chronic   . Arthritis   . Gynecomastia     2/2 spironolactone  . Atrial fibrillation   . Pericarditis     due to perforation  . Inguinal hernia recurrent bilateral   . GERD (gastroesophageal reflux disease)   . Anemia 05/2014  . Cardiac arrest 12/05/2013  . Acute on chronic systolic  heart failure 12/05/2013  . THYROTOXICOSIS 02/07/2009    Qualifier: Diagnosis of By: [name]   . SVT (supraventricular tachycardia) 12/05/2013    Past Surgical History  Procedure Laterality Date  . Icd  Feb 2012    Change out with LV lead placed with tunneling from the right to the left side  .  Endovascular stent insertion      for AAA  . Cardiac catheterization      x2  . Pacemaker insertion    . Ventricular ablation surgery      s/p prior EPS and ablation at [facility] 9/12, [facility] 11/12, and most recent at [facility] 08/23/11  . Tonsillectomy    . V-tach ablation N/A 08/24/2011    Procedure: V-TACH ABLATION; Surgeon: Thompson Grayer, MD; Location: [facility] CATH LAB; Service: Cardiovascular; Laterality: N/A;  . Inguinal hernia repair Bilateral 06/20/2014    Procedure: LAPAROSCOPIC BILATERAL INGUINAL HERNIA REPAIR; Surgeon: Michael Boston, MD; Location: [facility]; Service: General; Laterality: Bilateral;  . Insertion of mesh N/A 06/20/2014    Procedure: INSERTION OF MESH; Surgeon: Michael Boston, MD; Location: [place]; Service: General; Laterality: N/A;  . Cardioversion N/A 07/05/2014    Procedure: CARDIOVERSION; Surgeon: Larey Dresser, MD; Location: [place]; Service: Cardiovascular; Laterality: N/A;     Current Outpatient Prescriptions  Medication Sig Dispense Refill  . acetaminophen (TYLENOL) 500 MG tablet Take 1,000 mg by mouth every 6 (six) hours as needed for moderate pain.    [name] ALPRAZolam (XANAX) 0.25 MG tablet Take 0.5-1 tablets (0.125-0.25 mg total) by mouth 4 (four) times daily. (Patient taking differently: Take 0.125-0.25 mg by mouth See admin instructions. Takes 1/2 tab around 2AM, takes 1 tab at 6AM, Takes 1/2 tab at 1030AM, takes 1 tab at [facility], takes 1/2 at [facility], and takes 1/2 tab at 9PM) 360 tablet 1  . Ascorbic Acid (VITAMIN C) 500 MG tablet Take 500 mg by mouth daily.     . carvedilol (COREG) 12.5 MG tablet Take 1 tablet (12.5 mg total) by mouth 2 (two) times daily. 180 tablet 3  . Docusate Calcium (STOOL SOFTENER PO) Take 1 capsule by mouth daily.    . furosemide (LASIX) 20 MG tablet Take 1 tablet (20 mg total) by mouth 2 (two) times daily. 180 tablet 3  . levothyroxine  (SYNTHROID, LEVOTHROID) 88 MCG tablet Take 88-132 mcg by mouth daily. Take 1.5 tablets on Friday and 1 tablet the rest of the week.    . loratadine (CLARITIN) 10 MG tablet Take 10 mg by mouth daily.    [name] losartan (COZAAR) 50 MG tablet Take 0.5 tablets (25 mg total) by mouth daily. 45 tablet 3  . meclizine (ANTIVERT) 25 MG tablet Take 25 mg by mouth 3 (three) times daily.    [name] mexiletine (MEXITIL) 150 MG capsule Take 1 capsule (150 mg total) by mouth 3 (three) times daily. 270 capsule 3  . Multiple Vitamin (MULTIVITAMIN WITH MINERALS) TABS tablet Take 1 tablet by mouth daily.    . Omega-3 Fatty Acids (FISH OIL) 1000 MG CAPS Take 1,000 mg by mouth daily.     . ranitidine (ZANTAC) 150 MG tablet Take 150 mg by mouth every evening.    . rosuvastatin (CRESTOR) 5 MG tablet Take 1 tablet (5 mg total) by mouth 2 (two) times a week. Every Tuesday and Saturday 30 tablet 3  . sotalol (BETAPACE) 120 MG tablet Take 1 tablet (120 mg total) by mouth 2 (two) times daily. 180 tablet 3  . warfarin (  COUMADIN) 2.5 MG tablet Take as directed by Coumadin clinic 120 tablet 1   No current facility-administered medications for this visit.    Allergies: Iohexol and Prednisone    Social History: The patient  reports that he quit smoking about 20 years ago. He has never used smokeless tobacco. He reports that he drinks alcohol. He reports that he does not use illicit drugs.   Family History: The patient's family history includes Heart attack in his father and mother; Heart disease in his father, mother, and sister; Hyperlipidemia in his father, mother, and sister; Hypertension in his brother, father, and mother; Stroke in his mother.    ROS: Please see the history of present illness. Otherwise, review of systems are positive for none. All other systems are reviewed and negative.    PHYSICAL EXAM: VS: BP 110/58 mmHg  Pulse 70  Ht 5\' 8"  (1.727 m)  Wt  164 lb (74.39 kg)  BMI 24.94 kg/m2 , BMI Body mass index is 24.94 kg/(m^2). GEN: Well nourished, well developed, in no acute distress  HEENT: normal  Neck: no JVD, carotid bruits, or masses Cardiac: RRR; no murmurs, rubs, or gallops,no edema  Respiratory: clear to auscultation bilaterally, normal work of breathing GI: soft, nontender, nondistended, + BS MS: no deformity or atrophy  Skin: warm and dry, no rash Neuro: Strength and sensation are intact Psych: euthymic mood, full affect   EKG: EKG is not ordered today.   Recent Labs: 12/18/2013: TSH 0.85 01/17/2014: Pro B Natriuretic peptide (BNP) 3228.0* 01/20/2014: ALT 15 05/06/2014: Magnesium 2.1 06/29/2014: B Natriuretic Peptide 1486.2* 07/19/2014: BUN 21; Creatinine 1.19; Hemoglobin 13.0; Platelets 308.0; Potassium 4.3; Sodium 138    Lipid Panel  Labs (Brief)       Component Value Date/Time   CHOL 129 12/18/2013 1032   TRIG 111.0 12/18/2013 1032   HDL 31.20* 12/18/2013 1032   CHOLHDL 4 12/18/2013 1032   VLDL 22.2 12/18/2013 1032   LDLCALC 76 12/18/2013 1032       Wt Readings from Last 3 Encounters:  10/17/14 164 lb (74.39 kg)  08/07/14 163 lb 6.4 oz (74.118 kg)  07/19/14 163 lb 12.8 oz (74.299 kg)         ASSESSMENT AND PLAN:  1. Paroxysmal atrial flutter which has converted back to normal sinus rhythm on higher dose of sotalol 120 mg twice a day 1. ischemic heart disease with ischemic cardiomyopathy. Ejection fraction 20-25% with apical aneurysm by echocardiogram 12/06/13        He has had persistant atrial fib  Scheduled for cardioversion      Cainen Burnham, Wonda Cheng, MD  11/22/2014 9:51 AM    [address],  [place], [place]  [address] Pager [phone] Phone: [phone]; Fax: [phone]   [facility]  [address] [place], [place]  [address] [phone]    Fax [phone]

## 2014-11-22 NOTE — CV Procedure (Signed)
    Cardioversion Note  KAIYAN LUCZAK [id number] 08-Jun-1940  Procedure: DC Cardioversion Indications: atrial flutter    Procedure Details Consent: Obtained Time Out: Verified patient identification, verified procedure, site/side was marked, verified correct patient position, special equipment/implants available, Radiology Safety Procedures followed,  medications/allergies/relevent history reviewed, required imaging and test results available.  Performed  The patient has been on adequate anticoagulation.  The patient received IV Lidocaine 60 mg followed by Propofol 80 mg  for sedation.  Synchronous cardioversion was performed at 50  joules.  The cardioversion was successful     Complications: No apparent complications Patient did tolerate procedure well.   Thayer Headings, Brooke Bonito., MD, [facility] 11/22/2014, 10:57 AM

## 2014-11-22 NOTE — Telephone Encounter (Signed)
New Message        Pt's wife calling stating that pt just had a cardioversion today and wants to know when he needs to f/u and with who. Please call back and advise.

## 2014-11-25 ENCOUNTER — Other Ambulatory Visit: Payer: Self-pay

## 2014-11-25 ENCOUNTER — Encounter (HOSPITAL_COMMUNITY): Payer: Self-pay | Admitting: Cardiovascular Disease

## 2014-11-27 ENCOUNTER — Encounter: Payer: Self-pay | Admitting: Cardiology

## 2014-11-29 ENCOUNTER — Ambulatory Visit (INDEPENDENT_AMBULATORY_CARE_PROVIDER_SITE_OTHER): Payer: Medicare Other | Admitting: Pharmacist

## 2014-11-29 ENCOUNTER — Other Ambulatory Visit: Payer: Self-pay | Admitting: *Deleted

## 2014-11-29 DIAGNOSIS — Z7901 Long term (current) use of anticoagulants: Secondary | ICD-10-CM | POA: Diagnosis not present

## 2014-11-29 DIAGNOSIS — I255 Ischemic cardiomyopathy: Secondary | ICD-10-CM

## 2014-11-29 LAB — POCT INR: INR: 2.8

## 2014-11-29 MED ORDER — LEVOTHYROXINE SODIUM 88 MCG PO TABS: ORAL_TABLET | ORAL | Status: AC

## 2014-12-03 ENCOUNTER — Encounter: Payer: Self-pay | Admitting: Internal Medicine

## 2014-12-05 ENCOUNTER — Ambulatory Visit (INDEPENDENT_AMBULATORY_CARE_PROVIDER_SITE_OTHER): Payer: Medicare Other | Admitting: *Deleted

## 2014-12-05 DIAGNOSIS — Z95810 Presence of automatic (implantable) cardiac defibrillator: Secondary | ICD-10-CM

## 2014-12-05 DIAGNOSIS — I5022 Chronic systolic (congestive) heart failure: Secondary | ICD-10-CM | POA: Diagnosis not present

## 2014-12-06 ENCOUNTER — Encounter: Payer: Self-pay | Admitting: *Deleted

## 2014-12-06 NOTE — Progress Notes (Signed)
EPIC Encounter for ICM Monitoring  Patient Name: [name] is a 74 y.o. male Date: 12/06/2014 Primary Care Physican: Sheela Stack, MD Primary Cardiologist: Mare Ferrari Electrophysiologist: Caryl Comes Dry Weight: 163 lbs  Bi-V pacing: >99%       In the past month, have you:  1. Gained more than 2 pounds in a day or more than 5 pounds in a week? no  2. Had changes in your medications (with verification of current medications)? no  3. Had more shortness of breath than is usual for you? no  4. Limited your activity because of shortness of breath? no  5. Not been able to sleep because of shortness of breath? no  6. Had increased swelling in your feet or ankles? no  7. Had symptoms of dehydration (dizziness, dry mouth, increased thirst, decreased urine output) no  8. Had changes in sodium restriction? no  9. Been compliant with medication? Yes   ICM trend:   Follow-up plan: ICM clinic phone appointment: 01/06/15. The patient is doing well at the present time per his report. He did have episodes of ATP on 11/12/14 and then was called by the device clinic staff on 11/15/14 about episodes of A-flutter. He did call the office on 11/19/14 with reports of an irregular heart beat. He was cardioverted on 11/22/14. Per his most recent transmission, his AF burden has significantly improved. He is due to follow up on 12/12/14 with Dr. Mare Ferrari. No changes made today as corvue readings are presently at baseline.    Copy of note sent to patient's primary care physician, primary cardiologist, and device following physician.  Alvis Lemmings, RN, BSN 12/06/2014 11:43 AM

## 2014-12-12 ENCOUNTER — Ambulatory Visit (INDEPENDENT_AMBULATORY_CARE_PROVIDER_SITE_OTHER): Payer: Medicare Other | Admitting: Cardiology

## 2014-12-12 ENCOUNTER — Encounter: Payer: Self-pay | Admitting: Cardiology

## 2014-12-12 ENCOUNTER — Ambulatory Visit (INDEPENDENT_AMBULATORY_CARE_PROVIDER_SITE_OTHER): Payer: Medicare Other | Admitting: *Deleted

## 2014-12-12 VITALS — BP 104/62 | HR 140 | Ht 68.0 in | Wt 167.0 lb

## 2014-12-12 DIAGNOSIS — I255 Ischemic cardiomyopathy: Secondary | ICD-10-CM

## 2014-12-12 DIAGNOSIS — Z7901 Long term (current) use of anticoagulants: Secondary | ICD-10-CM

## 2014-12-12 DIAGNOSIS — Z95810 Presence of automatic (implantable) cardiac defibrillator: Secondary | ICD-10-CM | POA: Diagnosis not present

## 2014-12-12 LAB — POCT INR: INR: 2.3

## 2014-12-12 NOTE — Progress Notes (Signed)
Cardiology Office Note   Date:  12/12/2014   ID:  [name], DOB 25-Dec-1940, MRN [id number]  PCP:  Sheela Stack, MD  Cardiologist: Darlin Coco MD  Chief Complaint  Patient presents with  . Atrial Fibrillation      History of Present Illness: [name] is a 74 y.o. male who presents for follow-up office visit   He was admitted on 06/27/14 and discharged on 07/05/14. He was admitted because of progressive weakness and postoperative blood loss with hemoglobin of 7.5. A week or earlier on 06/20/14 he had undergone laparoscopic repair of inguinal hernias. He has been on chronic anticoagulation. He did well initially postoperatively but then after anticoagulation was resumed he developed postoperative blood loss which required 2 units of packed cells. While in the hospital he went into atrial flutter. On the day of discharge, 07/05/14 he underwent direct current cardioversion prior to discharge. The cardioversion was successful and he went home in normal sinus rhythm. He had a subsequent run of atrial flutter lasting several weeks after which it converted on its own back to normal sinus rhythm.He went back into atrial flutter on 11/11/14.  He underwent outpatient electrical cardioversion on 11/20/58.  Since then he has been feeling well.  He has a history of a remote anterior wall myocardial infarction. He has an ischemic cardiomyopathy. He has a left ventricular apical aneurysm. He has had a defibrillator for ventricular tachycardia. He has been followed closely by Dr. Caryl Comes and Dr. Rayann Heman. 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 Subsequently he had recurrent ventricular tachycardia and was transferred to [facility] for incessant VT and underwent a second ablation procedure complicated by pericardial perforation and pericarditis. He underwent a third catheter VT ablation procedure in March 2013 by Dr. Rayann Heman and since then has  felt well. He has not been aware of any palpitations. He has not been aware of any shocks from his ICD. He does have a known ischemic cardiomyopathy with ejection fraction of 25-30% by echocardiogram in February 2012. In autumn 2014 he had some problems with fluid overload [place] to increased diuretic and to dietary salt restriction. At the time of this prior admission his echocardiogram on 12/06/13 showed an ejection fraction of 20-25% and there was an apical aneurysm. The patient is on Crestor 5 mg twice a week.  He will be getting his cholesterol checked when he has his annual physical with Dr. Forde Dandy in several months.  He has been walking more than a mile a day for exercise. He is not having any exertional chest pain. No dizziness or syncope. No palpitations. He has not had any shocks from his ICD.  Past Medical History  Diagnosis Date  . CAD (coronary artery disease) 1999    Remote anterolater and apical MI, cath 2011 patent stents RCA/LCx  LAD w/o obstruction  . Cardiomyopathy, ischemic     EF 20-25%  echo 2012  . Chronic systolic congestive heart failure   . ICD (implantable cardiac defibrillator) in place     [place] [employer]  . 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 Abdominal aortic aneurysm 2009    Repaired with endovascular stent  . PVD (peripheral vascular disease)     Has occluded innominate vein  . Myocardial infarction   . Hypertension   . Ventricular tachycardia     recurrent slow VT at 100-110 bpm,  Rx  mexilitene/sotalol  . Left bundle branch block   . Systolic CHF, chronic   . Arthritis   . Gynecomastia     2/2 spironolactone  . Atrial fibrillation   . Pericarditis     due to perforation  . Inguinal hernia recurrent bilateral   . GERD (gastroesophageal reflux disease)   . Anemia 05/2014  . Cardiac arrest 12/05/2013  . Acute on chronic systolic heart failure 3/0/0923  . THYROTOXICOSIS 02/07/2009    Qualifier: Diagnosis of   By: [name]    . SVT (supraventricular tachycardia) 12/05/2013    Past Surgical History  Procedure Laterality Date  . Icd  Feb 2012    Change out with LV lead placed with tunneling from the right  to the left side  . Endovascular stent insertion      for AAA  . Cardiac catheterization      x2  . Pacemaker insertion    . Ventricular ablation surgery      s/p prior EPS and ablation at [facility] 9/12, [facility] 11/12, and most recent at [facility] 08/23/11  . Tonsillectomy    . V-tach ablation N/A 08/24/2011    Procedure: V-TACH ABLATION;  Surgeon: Thompson Grayer, MD;  Location: [facility] CATH LAB;  Service: Cardiovascular;  Laterality: N/A;  . Inguinal hernia repair Bilateral 06/20/2014    Procedure: LAPAROSCOPIC BILATERAL INGUINAL HERNIA REPAIR;  Surgeon: Michael Boston, MD;  Location: [place];  Service: General;  Laterality: Bilateral;  . Insertion of mesh N/A 06/20/2014    Procedure: INSERTION OF MESH;  Surgeon: Michael Boston, MD;  Location: [place];  Service: General;  Laterality: N/A;  . Cardioversion N/A 07/05/2014    Procedure: CARDIOVERSION;  Surgeon: Larey Dresser, MD;  Location: [place];  Service: Cardiovascular;  Laterality: N/A;  . Cardioversion N/A 11/22/2014    Procedure: CARDIOVERSION;  Surgeon: Thayer Headings, MD;  Location: [facility] ENDOSCOPY;  Service: Cardiovascular;  Laterality: N/A;     Current Outpatient Prescriptions  Medication Sig Dispense Refill  . acetaminophen (TYLENOL) 500 MG tablet Take 250 mg by mouth 2 (two) times daily.     [name] ALPRAZolam (XANAX) 0.25 MG tablet Takes 1/2 tab around 2AM, takes 1 tab at 6AM,  Takes 1/2 tab at 1030AM, takes 1 tab at [facility], takes 1/2 at [facility], and takes 1/2 tab at 9PM. Or as directed (Patient taking differently: Take 0.125-0.25 mg by mouth 6 (six) times daily. Take 1/2 tablet (0.125 mg) at 2am, take 1 tablet (0.25 mg) at 6am, take 1/2 tablet (0.125 mg) at 10:30am, take 1 tablet (0.25 mg) at 2pm, take 1/2 tablet (0.125 mg) at 6pm, take 1/2 tablet  (0.125 mg) at 9pm) 360 tablet 1  . Ascorbic Acid (VITAMIN C) 500 MG tablet Take 500 mg by mouth daily at 2 PM daily at 2 PM.     . carvedilol (COREG) 12.5 MG tablet Take 1 tablet (12.5 mg total) by mouth 2 (two) times daily. 180 tablet 3  . docusate sodium (COLACE) 100 MG capsule Take 100 mg by mouth daily at 2 PM daily at 2 PM.    . furosemide (LASIX) 20 MG tablet Take 1 tablet (20 mg total) by mouth 2 (two) times daily. (Patient taking differently: Take 20 mg by mouth 2 (two) times daily. 6am and 2pm) 180 tablet 3  . levothyroxine (SYNTHROID, LEVOTHROID) 88 MCG tablet Take 1 1/2 tablet (132 mcg) on Friday, take 1 tablet (88 mcg) on all other days of the week 100 tablet  3  . loratadine (CLARITIN) 10 MG tablet Take 10 mg by mouth daily at 2 PM daily at 2 PM.     . losartan (COZAAR) 50 MG tablet Take 0.5 tablets (25 mg total) by mouth daily. (Patient taking differently: Take 25 mg by mouth daily at 6 (six) AM. ) 45 tablet 3  . meclizine (ANTIVERT) 25 MG tablet Take 25 mg by mouth 3 (three) times daily. 6am, 2pm, bedtime    . mexiletine (MEXITIL) 150 MG capsule Take 1 capsule (150 mg total) by mouth 3 (three) times daily. (Patient taking differently: Take 150 mg by mouth 3 (three) times daily. 6am, 2pm, bedtime) 270 capsule 3  . Multiple Vitamin (MULTIVITAMIN WITH MINERALS) TABS tablet Take 1 tablet by mouth daily at 2 PM daily at 2 PM.     . Omega-3 Fatty Acids (FISH OIL) 1000 MG CAPS Take 1,000 mg by mouth daily at 2 PM daily at 2 PM.     . ranitidine (ZANTAC) 150 MG tablet Take 150 mg by mouth daily at 6 (six) AM.     . rosuvastatin (CRESTOR) 5 MG tablet Take 1 tablet (5 mg total) by mouth 2 (two) times a week. Every Tuesday and Saturday (Patient taking differently: Take 5 mg by mouth 2 (two) times a week. Every Tuesday and Saturday at 6pm) 30 tablet 3  . sotalol (BETAPACE) 120 MG tablet Take 1 tablet (120 mg total) by mouth 2 (two) times daily. (Patient taking differently: Take 120 mg by mouth 2  (two) times daily. 6am, 6pm) 180 tablet 3  . warfarin (COUMADIN) 2.5 MG tablet Take as directed by Coumadin clinic (Patient taking differently: Take 2.5-3.75 mg by mouth daily at 6 PM. Take 1 tablet (2.5 mg) on Tuesday and Saturday, take 1 1/2 tablets (3.75 mg) on Sunday, Monday, Wednesday, Thursday, Friday or  as directed by Coumadin clinic) 120 tablet 1   No current facility-administered medications for this visit.    Allergies:   Iohexol and Prednisone    Social History:  The patient  reports that he quit smoking about 20 years ago. He has never used smokeless tobacco. He reports that he drinks alcohol. He reports that he does not use illicit drugs.   Family History:  The patient's family history includes Heart attack in his father and mother; Heart disease in his father, mother, and sister; Hyperlipidemia in his father, mother, and sister; Hypertension in his brother, father, and mother; Stroke in his mother.    ROS:  Please see the history of present illness.   Otherwise, review of systems are positive for none.   All other systems are reviewed and negative.    PHYSICAL EXAM: VS:  BP 104/62 mmHg  Pulse 140  Ht 5\' 8"  (1.727 m)  Wt 167 lb (75.751 kg)  BMI 25.40 kg/m2  SpO2 99% , BMI Body mass index is 25.4 kg/(m^2). GEN: Well nourished, well developed, in no acute distress HEENT: normal Neck: no JVD, carotid bruits, or masses Cardiac: RRR; no murmurs, rubs, or gallops,no edema  Respiratory:  clear to auscultation bilaterally, normal work of breathing GI: soft, nontender, nondistended, + BS MS: no deformity or atrophy Skin: warm and dry, no rash Neuro:  Strength and sensation are intact Psych: euthymic mood, full affect   EKG:  EKG is ordered today. The ekg ordered today demonstrates AV paced rhythm at 70/min   Recent Labs: 12/18/2013: TSH 0.85 01/17/2014: Pro B Natriuretic peptide (BNP) 3228.0* 01/20/2014: ALT 15 05/06/2014: Magnesium 2.1  06/29/2014: B Natriuretic Peptide  1486.2* 11/20/2014: BUN 17; Creatinine, Ser 1.87*; Hemoglobin 13.3; Platelets 249.0; Potassium 4.6; Sodium 138    Lipid Panel    Component Value Date/Time   CHOL 129 12/18/2013 1032   TRIG 111.0 12/18/2013 1032   HDL 31.20* 12/18/2013 1032   CHOLHDL 4 12/18/2013 1032   VLDL 22.2 12/18/2013 1032   LDLCALC 76 12/18/2013 1032      Wt Readings from Last 3 Encounters:  12/12/14 167 lb (75.751 kg)  11/22/14 159 lb 9 oz (72.377 kg)  10/17/14 164 lb (74.39 kg)       ASSESSMENT AND PLAN:  1. Paroxysmal atrial flutter.  Most recent cardioversion 11/20/14 1. ischemic heart disease with ischemic cardiomyopathy. Ejection fraction 20-25% with apical aneurysm by echocardiogram 12/06/13  2.  Prior cardiac arrest secondary to acute respiratory insufficiency and pulmonary edema secondary to combined systolic and diastolic acute heart failure.  3. past history of ventricular tachycardia. He has had 3 separate VT ablation procedures, most recently in March 2013 by Dr. Rayann Heman  4. hypothyroidism followed by Dr. Forde Dandy  5. hypercholesterolemia  6. recent admission 01/17/14-01/20/14 for dyspnea felt to be probable acute bronchitis as well as slight worsening of his chronic systolic heart failure.normal VQ scan 7. Recent emergency room visit on January 16 for acute shortness of breath which responded to IV Lasix. He is now on a higher dose of Lasix 20 mg twice a day. 8. Status post bilateral laparoscopic inguinal hernia repair with postoperative blood loss after resumption of anticoagulation.   Current medicines are reviewed at length with the patient today.  The patient does not have concerns regarding medicines.  The following changes have been made:  no change  Labs/ tests ordered today include:   Orders Placed This Encounter  Procedures  . EKG 12-Lead     Disposition: Continue current medication.  Recheck in 2 months for office visit basal metabolic panel CBC and EKG. He will be  getting full labs including cholesterol and thyroid when he has his complete physical with Dr. Forde Dandy later this fall  Signed, Darlin Coco MD 12/12/2014 1:03 PM    [address], [place], [place]  [address] Phone: [phone]; Fax: [phone]

## 2014-12-12 NOTE — Patient Instructions (Signed)
Medication Instructions:  Your physician recommends that you continue on your current medications as directed. Please refer to the Current Medication list given to you today.  Labwork: NONE  Testing/Procedures: NONE  Follow-Up: Your physician recommends that you schedule a follow-up appointment in: 2 MONTH OV

## 2014-12-17 ENCOUNTER — Encounter: Payer: Self-pay | Admitting: Internal Medicine

## 2015-01-02 ENCOUNTER — Ambulatory Visit (INDEPENDENT_AMBULATORY_CARE_PROVIDER_SITE_OTHER): Payer: Medicare Other | Admitting: *Deleted

## 2015-01-02 DIAGNOSIS — Z7901 Long term (current) use of anticoagulants: Secondary | ICD-10-CM

## 2015-01-02 DIAGNOSIS — I255 Ischemic cardiomyopathy: Secondary | ICD-10-CM

## 2015-01-02 LAB — POCT INR: INR: 2.6

## 2015-01-06 ENCOUNTER — Ambulatory Visit (INDEPENDENT_AMBULATORY_CARE_PROVIDER_SITE_OTHER): Payer: Medicare Other | Admitting: *Deleted

## 2015-01-06 DIAGNOSIS — I5022 Chronic systolic (congestive) heart failure: Secondary | ICD-10-CM | POA: Diagnosis not present

## 2015-01-06 DIAGNOSIS — Z95810 Presence of automatic (implantable) cardiac defibrillator: Secondary | ICD-10-CM

## 2015-01-09 ENCOUNTER — Telehealth: Payer: Self-pay

## 2015-01-09 ENCOUNTER — Other Ambulatory Visit: Payer: Self-pay | Admitting: *Deleted

## 2015-01-09 DIAGNOSIS — Z95828 Presence of other vascular implants and grafts: Secondary | ICD-10-CM

## 2015-01-09 DIAGNOSIS — I714 Abdominal aortic aneurysm, without rupture, unspecified: Secondary | ICD-10-CM

## 2015-01-09 NOTE — Telephone Encounter (Signed)
Patient returned call

## 2015-01-09 NOTE — Telephone Encounter (Signed)
Follow up      Returning Laurie's call

## 2015-01-09 NOTE — Telephone Encounter (Signed)
ICM transmission received.  Attempted call to patient and left message for return call. 

## 2015-01-09 NOTE — Progress Notes (Signed)
EPIC Encounter for ICM Monitoring  Patient Name: [name] is a 74 y.o. male Date: 01/09/2015 Primary Care Physican: Sheela Stack, MD Primary Cardiologist: Mare Ferrari Electrophysiologist: Caryl Comes Dry Weight: 165 lbs       Bi-V Pacing 99%.  In the past month, have you:  1. Gained more than 2 pounds in a day or more than 5 pounds in a week? no  2. Had changes in your medications (with verification of current medications)? no  3. Had more shortness of breath than is usual for you? no  4. Limited your activity because of shortness of breath? no  5. Not been able to sleep because of shortness of breath? no  6. Had increased swelling in your feet or ankles? no  7. Had symptoms of dehydration (dizziness, dry mouth, increased thirst, decreased urine output) no  8. Had changes in sodium restriction? no  9. Been compliant with medication? Yes   ICM trend:   Follow-up plan: ICM clinic phone appointment 02/11/2015.  CorVue impedance showed trending close to baseline. Patient reported he is doing well but has some occasional fatigue.  He stated he continues to walk daily and tolerating well.  Reminder to stay hydrated when walking and he reported he carries bottled water with him. Weight ranges 163-167 lbs and was 165 lbs today.  He reported he has restricted his sodium intake to 650mg  daily and asked if that is too low.  I stated I will forward message to Dr. Mare Ferrari for recommendation for range of daily sodium intake at next appointment scheduled on 02/05/2015.  No changes made today.     Copy of note sent to patient's primary care physician, primary cardiologist, and device following physician.  Rosalene Billings, RN, CCM 01/09/2015 11:49 AM

## 2015-01-13 ENCOUNTER — Ambulatory Visit: Payer: Medicare Other | Admitting: Surgery

## 2015-01-13 ENCOUNTER — Other Ambulatory Visit (HOSPITAL_COMMUNITY): Payer: Medicare Other

## 2015-01-14 ENCOUNTER — Encounter: Payer: Self-pay | Admitting: Internal Medicine

## 2015-01-15 ENCOUNTER — Telehealth: Payer: Self-pay | Admitting: *Deleted

## 2015-01-15 NOTE — Telephone Encounter (Signed)
LMOM to return call in reference to VT with ATP on transmission sent 01/14/15 at 6pm.

## 2015-01-17 ENCOUNTER — Encounter: Payer: Self-pay | Admitting: Surgery

## 2015-01-18 ENCOUNTER — Encounter (HOSPITAL_COMMUNITY): Payer: Self-pay | Admitting: Emergency Medicine

## 2015-01-18 ENCOUNTER — Emergency Department (HOSPITAL_COMMUNITY): Payer: Medicare Other

## 2015-01-18 ENCOUNTER — Observation Stay (HOSPITAL_COMMUNITY): Payer: Medicare Other

## 2015-01-18 ENCOUNTER — Observation Stay (HOSPITAL_COMMUNITY)
Admission: EM | Admit: 2015-01-18 | Discharge: 2015-01-19 | Disposition: A | Discharge status: Home / Self Care | Payer: Medicare Other | Attending: Internal Medicine | Admitting: Internal Medicine

## 2015-01-18 DIAGNOSIS — I252 Old myocardial infarction: Secondary | ICD-10-CM | POA: Insufficient documentation

## 2015-01-18 DIAGNOSIS — I4729 Other ventricular tachycardia: Secondary | ICD-10-CM

## 2015-01-18 DIAGNOSIS — Z87891 Personal history of nicotine dependence: Secondary | ICD-10-CM | POA: Insufficient documentation

## 2015-01-18 DIAGNOSIS — I4891 Unspecified atrial fibrillation: Secondary | ICD-10-CM | POA: Diagnosis present

## 2015-01-18 DIAGNOSIS — I714 Abdominal aortic aneurysm, without rupture: Secondary | ICD-10-CM | POA: Insufficient documentation

## 2015-01-18 DIAGNOSIS — R0602 Shortness of breath: Secondary | ICD-10-CM | POA: Diagnosis present

## 2015-01-18 DIAGNOSIS — I472 Ventricular tachycardia: Secondary | ICD-10-CM | POA: Diagnosis not present

## 2015-01-18 DIAGNOSIS — M1990 Unspecified osteoarthritis, unspecified site: Secondary | ICD-10-CM | POA: Insufficient documentation

## 2015-01-18 DIAGNOSIS — E0590 Thyrotoxicosis, unspecified without thyrotoxic crisis or storm: Secondary | ICD-10-CM | POA: Diagnosis not present

## 2015-01-18 DIAGNOSIS — I5023 Acute on chronic systolic (congestive) heart failure: Secondary | ICD-10-CM | POA: Insufficient documentation

## 2015-01-18 DIAGNOSIS — I10 Essential (primary) hypertension: Secondary | ICD-10-CM | POA: Insufficient documentation

## 2015-01-18 DIAGNOSIS — D649 Anemia, unspecified: Secondary | ICD-10-CM | POA: Diagnosis not present

## 2015-01-18 DIAGNOSIS — Z79899 Other long term (current) drug therapy: Secondary | ICD-10-CM | POA: Diagnosis not present

## 2015-01-18 DIAGNOSIS — Z95810 Presence of automatic (implantable) cardiac defibrillator: Secondary | ICD-10-CM | POA: Diagnosis present

## 2015-01-18 DIAGNOSIS — N62 Hypertrophy of breast: Secondary | ICD-10-CM | POA: Diagnosis not present

## 2015-01-18 DIAGNOSIS — E785 Hyperlipidemia, unspecified: Secondary | ICD-10-CM | POA: Diagnosis present

## 2015-01-18 DIAGNOSIS — I739 Peripheral vascular disease, unspecified: Secondary | ICD-10-CM | POA: Diagnosis not present

## 2015-01-18 DIAGNOSIS — Z7901 Long term (current) use of anticoagulants: Secondary | ICD-10-CM | POA: Insufficient documentation

## 2015-01-18 DIAGNOSIS — I5022 Chronic systolic (congestive) heart failure: Secondary | ICD-10-CM | POA: Diagnosis not present

## 2015-01-18 DIAGNOSIS — I4892 Unspecified atrial flutter: Secondary | ICD-10-CM

## 2015-01-18 DIAGNOSIS — I255 Ischemic cardiomyopathy: Secondary | ICD-10-CM | POA: Diagnosis not present

## 2015-01-18 DIAGNOSIS — R0603 Acute respiratory distress: Secondary | ICD-10-CM | POA: Diagnosis present

## 2015-01-18 LAB — CBC
HEMATOCRIT: 44.4 % (ref 39.0–52.0)
Hemoglobin: 15.3 g/dL (ref 13.0–17.0)
MCH: 32.4 pg (ref 26.0–34.0)
MCHC: 34.5 g/dL (ref 30.0–36.0)
MCV: 94.1 fL (ref 78.0–100.0)
PLATELETS: 258 10*3/uL (ref 150–400)
RBC: 4.72 MIL/uL (ref 4.22–5.81)
RDW: 12.6 % (ref 11.5–15.5)
WBC: 7.5 10*3/uL (ref 4.0–10.5)

## 2015-01-18 LAB — COMPREHENSIVE METABOLIC PANEL
ALBUMIN: 3.7 g/dL (ref 3.5–5.0)
ALT: 20 U/L (ref 17–63)
ANION GAP: 10 (ref 5–15)
AST: 29 U/L (ref 15–41)
Alkaline Phosphatase: 48 U/L (ref 38–126)
BILIRUBIN TOTAL: 0.5 mg/dL (ref 0.3–1.2)
BUN: 17 mg/dL (ref 6–20)
CHLORIDE: 98 mmol/L — AB (ref 101–111)
CO2: 26 mmol/L (ref 22–32)
Calcium: 9.2 mg/dL (ref 8.9–10.3)
Creatinine, Ser: 1.55 mg/dL — ABNORMAL HIGH (ref 0.61–1.24)
GFR calc Af Amer: 50 mL/min — ABNORMAL LOW (ref >60)
GFR, EST NON AFRICAN AMERICAN: 43 mL/min — AB (ref >60)
GLUCOSE: 170 mg/dL — AB (ref 65–99)
POTASSIUM: 4.7 mmol/L (ref 3.5–5.1)
Sodium: 134 mmol/L — ABNORMAL LOW (ref 135–145)
Total Protein: 7.4 g/dL (ref 6.5–8.1)

## 2015-01-18 LAB — BRAIN NATRIURETIC PEPTIDE: B NATRIURETIC PEPTIDE 5: 682.3 pg/mL — AB (ref 0.0–100.0)

## 2015-01-18 LAB — I-STAT TROPONIN, ED: TROPONIN I, POC: 0.02 ng/mL (ref 0.00–0.08)

## 2015-01-18 LAB — APTT: APTT: 42 s — AB (ref 24–37)

## 2015-01-18 LAB — TROPONIN I
Troponin I: 0.04 ng/mL — ABNORMAL HIGH (ref <0.031)
Troponin I: 0.05 ng/mL — ABNORMAL HIGH (ref <0.031)

## 2015-01-18 LAB — TSH: TSH: 1.252 u[IU]/mL (ref 0.350–4.500)

## 2015-01-18 LAB — PROTIME-INR
INR: 2.41 — AB (ref 0.00–1.49)
Prothrombin Time: 26 seconds — ABNORMAL HIGH (ref 11.6–15.2)

## 2015-01-18 MED ORDER — LEVOTHYROXINE SODIUM 88 MCG PO TABS
88.0000 ug | ORAL_TABLET | Freq: Every day | ORAL | Status: DC
  Administered 2015-01-19: 88 ug via ORAL
  Filled 2015-01-18: qty 1

## 2015-01-18 MED ORDER — DOCUSATE SODIUM 100 MG PO CAPS
100.0000 mg | ORAL_CAPSULE | Freq: Every day | ORAL | Status: DC
  Administered 2015-01-18: 100 mg via ORAL
  Filled 2015-01-18: qty 1

## 2015-01-18 MED ORDER — ROSUVASTATIN CALCIUM 10 MG PO TABS: 5.0000 mg | ORAL_TABLET | ORAL | Status: DC

## 2015-01-18 MED ORDER — WARFARIN SODIUM 7.5 MG PO TABS: 3.7500 mg | ORAL_TABLET | ORAL | Status: DC

## 2015-01-18 MED ORDER — WARFARIN SODIUM 2.5 MG PO TABS: 2.5000 mg | ORAL_TABLET | ORAL | Status: DC

## 2015-01-18 MED ORDER — ACETAMINOPHEN 325 MG PO TABS: 650.0000 mg | ORAL_TABLET | Freq: Four times a day (QID) | ORAL | Status: DC | PRN

## 2015-01-18 MED ORDER — ALPRAZOLAM 0.25 MG PO TABS
0.1250 mg | ORAL_TABLET | Freq: Three times a day (TID) | ORAL | Status: DC | PRN
  Administered 2015-01-18: 0.25 mg via ORAL
  Filled 2015-01-18: qty 1

## 2015-01-18 MED ORDER — FUROSEMIDE 10 MG/ML IJ SOLN
20.0000 mg | Freq: Once | INTRAMUSCULAR | Status: AC
  Administered 2015-01-18: 20 mg via INTRAVENOUS
  Filled 2015-01-18: qty 2

## 2015-01-18 MED ORDER — FAMOTIDINE 20 MG PO TABS
20.0000 mg | ORAL_TABLET | Freq: Every day | ORAL | Status: DC
  Administered 2015-01-19: 20 mg via ORAL
  Filled 2015-01-18: qty 1

## 2015-01-18 MED ORDER — ACETAMINOPHEN 650 MG RE SUPP: 650.0000 mg | Freq: Four times a day (QID) | RECTAL | Status: DC | PRN

## 2015-01-18 MED ORDER — ALBUTEROL SULFATE (2.5 MG/3ML) 0.083% IN NEBU
5.0000 mg | INHALATION_SOLUTION | Freq: Once | RESPIRATORY_TRACT | Status: AC
  Administered 2015-01-18: 5 mg via RESPIRATORY_TRACT
  Filled 2015-01-18: qty 6

## 2015-01-18 MED ORDER — CARVEDILOL 12.5 MG PO TABS
12.5000 mg | ORAL_TABLET | Freq: Two times a day (BID) | ORAL | Status: DC
  Administered 2015-01-18 – 2015-01-19 (×2): 12.5 mg via ORAL
  Filled 2015-01-18 (×2): qty 1

## 2015-01-18 MED ORDER — SODIUM CHLORIDE 0.9 % IJ SOLN
3.0000 mL | Freq: Two times a day (BID) | INTRAMUSCULAR | Status: DC
  Administered 2015-01-18 – 2015-01-19 (×3): 3 mL via INTRAVENOUS

## 2015-01-18 MED ORDER — ONDANSETRON HCL 4 MG PO TABS: 4.0000 mg | ORAL_TABLET | Freq: Four times a day (QID) | ORAL | Status: DC | PRN

## 2015-01-18 MED ORDER — ASPIRIN 81 MG PO CHEW
324.0000 mg | CHEWABLE_TABLET | Freq: Once | ORAL | Status: AC
  Administered 2015-01-18: 324 mg via ORAL
  Filled 2015-01-18: qty 4

## 2015-01-18 MED ORDER — ALUM & MAG HYDROXIDE-SIMETH 200-200-20 MG/5ML PO SUSP: 30.0000 mL | Freq: Four times a day (QID) | ORAL | Status: DC | PRN

## 2015-01-18 MED ORDER — SODIUM CHLORIDE 0.9 % IJ SOLN: 3.0000 mL | INTRAMUSCULAR | Status: DC | PRN

## 2015-01-18 MED ORDER — WARFARIN SODIUM 2.5 MG PO TABS
2.5000 mg | ORAL_TABLET | ORAL | Status: DC
  Administered 2015-01-18: 2.5 mg via ORAL
  Filled 2015-01-18: qty 1

## 2015-01-18 MED ORDER — SODIUM CHLORIDE 0.9 % IJ SOLN
3.0000 mL | Freq: Two times a day (BID) | INTRAMUSCULAR | Status: DC
  Administered 2015-01-18 – 2015-01-19 (×2): 3 mL via INTRAVENOUS

## 2015-01-18 MED ORDER — TECHNETIUM TC 99M DIETHYLENETRIAME-PENTAACETIC ACID: 40.0000 | Freq: Once | INTRAVENOUS | Status: DC | PRN

## 2015-01-18 MED ORDER — FUROSEMIDE 20 MG PO TABS
20.0000 mg | ORAL_TABLET | Freq: Two times a day (BID) | ORAL | Status: DC
  Administered 2015-01-19: 20 mg via ORAL
  Filled 2015-01-18: qty 1

## 2015-01-18 MED ORDER — FUROSEMIDE 20 MG PO TABS: 20.0000 mg | ORAL_TABLET | Freq: Two times a day (BID) | ORAL | Status: DC

## 2015-01-18 MED ORDER — IPRATROPIUM BROMIDE 0.02 % IN SOLN
0.5000 mg | Freq: Once | RESPIRATORY_TRACT | Status: AC
  Administered 2015-01-18: 0.5 mg via RESPIRATORY_TRACT
  Filled 2015-01-18: qty 2.5

## 2015-01-18 MED ORDER — LOSARTAN POTASSIUM 25 MG PO TABS
25.0000 mg | ORAL_TABLET | Freq: Every day | ORAL | Status: DC
  Administered 2015-01-19: 25 mg via ORAL
  Filled 2015-01-18: qty 1

## 2015-01-18 MED ORDER — TECHNETIUM TO 99M ALBUMIN AGGREGATED
6.0000 | Freq: Once | INTRAVENOUS | Status: AC | PRN
Start: 2015-01-18 — End: 2015-01-18
  Administered 2015-01-18: 6 via INTRAVENOUS

## 2015-01-18 MED ORDER — OMEGA-3-ACID ETHYL ESTERS 1 G PO CAPS
1.0000 g | ORAL_CAPSULE | Freq: Every day | ORAL | Status: DC
  Administered 2015-01-19: 1 g via ORAL
  Filled 2015-01-18: qty 1

## 2015-01-18 MED ORDER — WARFARIN - PHARMACIST DOSING INPATIENT: Freq: Every day | Status: DC

## 2015-01-18 MED ORDER — SODIUM CHLORIDE 0.9 % IV SOLN: 250.0000 mL | INTRAVENOUS | Status: DC | PRN

## 2015-01-18 MED ORDER — ONDANSETRON HCL 4 MG/2ML IJ SOLN: 4.0000 mg | Freq: Four times a day (QID) | INTRAMUSCULAR | Status: DC | PRN

## 2015-01-18 MED ORDER — SOTALOL HCL 120 MG PO TABS
120.0000 mg | ORAL_TABLET | Freq: Two times a day (BID) | ORAL | Status: DC
  Administered 2015-01-18 – 2015-01-19 (×2): 120 mg via ORAL
  Filled 2015-01-18 (×3): qty 1

## 2015-01-18 NOTE — H&P (Signed)
[facility] History and Physical  CORTAVIOUS NIX ZOX:[id number] DOB: September 25, 1940 DOA: 01/18/2015  Referring physician:  PCP: Sheela Stack, MD   Chief Complaint: Shortness of breath  HPI: [name] is a 74 y.o. male with a past medical history of myocardial infarction, ischemic cardiomyopathy, left ventricular apical aneurysm, status post defibrillator placement for ventricular tachycardia, had incessant ventricular tachycardia undergoing several catheter ablations. He reported waking up this morning with increased shortness of breath. Family members called EMS finding patient to be sitting in chair with evidence of respiratory distress along with audible crackles. Initial workup included a chest x-ray which did not reveal acute cardiac pulmonary disease. Blood work revealed a BNP of 682 (previously 1486 on 06/29/2014) troponin of 0.02. He reports taking Lasix 20 g by mouth twice a day take his last dose this morning. During my encounter he reports feeling better and states breathing easier. He feels that treatment with nebulizers helped. He denies chest pain, fevers, chills, nausea, vomiting, abdominal pain, dysuria, hematuria.                            Review of Systems:  Constitutional:  No weight loss, night sweats, Fevers, chills, positive for fatigue.  HEENT:  No headaches, Difficulty swallowing,Tooth/dental problems,Sore throat,  No sneezing, itching, ear ache, nasal congestion, post nasal drip,  Cardio-vascular:  No chest pain, Orthopnea, PND, swelling in lower extremities, anasarca, dizziness, palpitations  GI:  No heartburn, indigestion, abdominal pain, nausea, vomiting, diarrhea, change in bowel habits, loss of appetite  Resp:  Positive for shortness of breath with exertion or at rest. No excess mucus, no productive cough, No non-productive cough, No coughing up of blood.No change in color of mucus.No wheezing.No chest wall deformity  Skin:  no rash or  lesions.  GU:  no dysuria, change in color of urine, no urgency or frequency. No flank pain.  Musculoskeletal:  No joint pain or swelling. No decreased range of motion. No back pain.  Psych:  No change in mood or affect. No depression or anxiety. No memory loss.   Past Medical History  Diagnosis Date  . CAD (coronary artery disease) 1999    Remote anterolater and apical MI, cath 2011 patent stents RCA/LCx  LAD w/o obstruction  . Cardiomyopathy, ischemic     EF 20-25%  echo 2012  . Chronic systolic congestive heart failure   . ICD (implantable cardiac defibrillator) in place     [facility]  . 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 Abdominal aortic aneurysm 2009    Repaired with endovascular stent  . PVD (peripheral vascular disease)     Has occluded innominate vein  . Myocardial infarction   . Hypertension   . Ventricular tachycardia     recurrent slow VT at 100-110 bpm,  Rx mexilitene/sotalol  . Left bundle branch block   . Systolic CHF, chronic   . Arthritis   . Gynecomastia     2/2 spironolactone  . Atrial fibrillation   . Pericarditis     due to perforation  . Inguinal hernia recurrent bilateral   . GERD (gastroesophageal reflux disease)   . Anemia 05/2014  . Cardiac arrest 12/05/2013  . Acute on chronic systolic heart failure 12/29/1912  . 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 SVT (supraventricular tachycardia) 12/05/2013   Past Surgical History  Procedure Laterality Date  . Icd  Feb 2012    Change out with LV lead placed with tunneling from the right  to the left side  . Endovascular stent insertion      for AAA  . Cardiac catheterization      x2  . Pacemaker insertion    . Ventricular ablation surgery      s/p prior EPS and ablation at [facility] 9/12, [facility] 11/12, and most recent [facility] 08/23/11  . Tonsillectomy    . V-tach ablation N/A 08/24/2011    Procedure: V-TACH ABLATION;  Surgeon: Thompson Grayer, MD;  Location: [facility]  CATH LAB;  Service: Cardiovascular;  Laterality: N/A;  . Inguinal hernia repair Bilateral 06/20/2014    Procedure: LAPAROSCOPIC BILATERAL INGUINAL HERNIA REPAIR;  Surgeon: Michael Boston, MD;  Location: [place];  Service: General;  Laterality: Bilateral;  . Insertion of mesh N/A 06/20/2014    Procedure: INSERTION OF MESH;  Surgeon: Michael Boston, MD;  Location: [place];  Service: General;  Laterality: N/A;  . Cardioversion N/A 07/05/2014    Procedure: CARDIOVERSION;  Surgeon: Larey Dresser, MD;  Location: [place];  Service: Cardiovascular;  Laterality: N/A;  . Cardioversion N/A 11/22/2014    Procedure: CARDIOVERSION;  Surgeon: Thayer Headings, MD;  Location: [facility] ENDOSCOPY;  Service: Cardiovascular;  Laterality: N/A;   Social History:  reports that he quit smoking about 21 years ago. He has never used smokeless tobacco. He reports that he drinks alcohol. He reports that he does not use illicit drugs.  Allergies  Allergen Reactions  . Iohexol Hives and Itching     Code: HIVES, Desc: hives w/ itching during cardiac cath '99, mult caths since w/ ? premeds, DR.G.HAYES REQUESTS 13 HR PRE MED//A.C.pt okay w/13 hr prep/mms, Onset Date: [id number]   . Prednisone Hives    Family History  Problem Relation Age of Onset  . Stroke Mother   . Heart attack Mother   . Heart disease Mother   . Hyperlipidemia Mother   . Hypertension Mother   . Heart attack Father   . Heart disease Father   . Hyperlipidemia Father   . Hypertension Father   . Heart disease Sister   . Hyperlipidemia Sister   . Hypertension Brother      Prior to Admission medications   Medication Sig Start Date End Date Taking? Authorizing Provider  acetaminophen (TYLENOL) 500 MG tablet Take 250 mg by mouth 2 (two) times daily.    Yes Historical Provider, MD  ALPRAZolam [name]) 0.25 MG tablet Takes 1/2 tab around 2AM, takes 1 tab at 6AM,  Takes 1/2 tab at 1030AM, takes 1 tab at [facility], takes 1/2 at [facility], and takes 1/2 tab at 9PM. Or as  directed Patient taking differently: Take 0.125-0.25 mg by mouth 6 (six) times daily. Take 1/2 tablet (0.125 mg) at 2am, take 1 tablet (0.25 mg) at 6am, take 1/2 tablet (0.125 mg) at 10:30am, take 1 tablet (0.25 mg) at 2pm, take 1/2 tablet (0.125 mg) at 6pm, take 1/2 tablet (0.125 mg) at 9pm 11/19/14  Yes Darlin Coco, MD  Ascorbic Acid (VITAMIN C) 500 MG tablet Take 500 mg by mouth daily at 2 PM daily at 2 PM.    Yes Historical Provider, MD  carvedilol (COREG) 12.5 MG tablet Take 1 tablet (12.5 mg total) by mouth 2 (two) times daily. 10/17/14  Yes Darlin Coco, MD  docusate sodium (COLACE) 100 MG capsule Take 100 mg by mouth daily at 2 PM daily at 2 PM.   Yes  Historical Provider, MD  furosemide (LASIX) 20 MG tablet Take 1 tablet (20 mg total) by mouth 2 (two) times daily. Patient taking differently: Take 20 mg by mouth 2 (two) times daily. 6am and 2pm 10/17/14  Yes Darlin Coco, MD  levothyroxine (SYNTHROID, LEVOTHROID) 88 MCG tablet Take 1 1/2 tablet (132 mcg) on Friday, take 1 tablet (88 mcg) on all other days of the week 11/29/14  Yes Darlin Coco, MD  loratadine (CLARITIN) 10 MG tablet Take 10 mg by mouth daily at 2 PM daily at 2 PM.    Yes Historical Provider, MD  losartan (COZAAR) 50 MG tablet Take 0.5 tablets (25 mg total) by mouth daily. Patient taking differently: Take 25 mg by mouth daily at 6 (six) AM.  10/17/14  Yes Darlin Coco, MD  meclizine (ANTIVERT) 25 MG tablet Take 25 mg by mouth 3 (three) times daily. 6am, 2pm, bedtime   Yes Historical Provider, MD  mexiletine (MEXITIL) 150 MG capsule Take 1 capsule (150 mg total) by mouth 3 (three) times daily. Patient taking differently: Take 150 mg by mouth 3 (three) times daily. 6am, 2pm, bedtime 10/17/14  Yes Darlin Coco, MD  Multiple Vitamin (MULTIVITAMIN WITH MINERALS) TABS tablet Take 1 tablet by mouth daily at 2 PM daily at 2 PM.    Yes Historical Provider, MD  Omega-3 Fatty Acids (FISH OIL) 1000 MG CAPS Take 1,000 mg by  mouth 2 (two) times daily. 6am and 6pm   Yes Historical Provider, MD  ranitidine (ZANTAC) 150 MG tablet Take 150 mg by mouth 4 (four) times daily.    Yes Historical Provider, MD  rosuvastatin (CRESTOR) 5 MG tablet Take 1 tablet (5 mg total) by mouth 2 (two) times a week. Every Tuesday and Saturday Patient taking differently: Take 5 mg by mouth 2 (two) times a week. Every Tuesday and Saturday at 6pm 10/17/14  Yes Darlin Coco, MD  sotalol (BETAPACE) 120 MG tablet Take 1 tablet (120 mg total) by mouth 2 (two) times daily. Patient taking differently: Take 120 mg by mouth 2 (two) times daily. 6am, 6pm 10/17/14  Yes Darlin Coco, MD  warfarin (COUMADIN) 2.5 MG tablet Take as directed by Coumadin clinic Patient taking differently: Take 2.5-3.75 mg by mouth daily at 6 PM. Take 1 tablet (2.5 mg) on Tuesday and Saturday, take 1 1/2 tablets (3.75 mg) on Sunday, Monday, Wednesday, Thursday, Friday or  as directed by Coumadin clinic 10/17/14  Yes Darlin Coco, MD   Physical Exam: Filed Vitals:   01/18/15 1230 01/18/15 1245 01/18/15 1300 01/18/15 1315  BP: 108/76 118/76 106/58 113/82  Pulse: 76 76 73 110  Temp:      TempSrc:      Resp: 17 18 11 18   Height:      Weight:      SpO2: 97% 99% 100% 98%    Wt Readings from Last 3 Encounters:  01/18/15 76.204 kg (168 lb)  12/12/14 75.751 kg (167 lb)  11/22/14 72.377 kg (159 lb 9 oz)    General:  Appears calm and comfortable, he is awake and alert, in no acute distress, states feeling better. Eyes: PERRL, normal lids, irises & conjunctiva ENT: grossly normal hearing, lips & tongue Neck: no LAD, masses or thyromegaly Cardiovascular: RRR, no m/r/g. No LE edema. Telemetry: SR, no arrhythmias  Respiratory: CTA bilaterally, no w/r/r. Normal respiratory effort. Abdomen: soft, ntnd Skin: no rash or induration seen on limited exam Musculoskeletal: grossly normal tone BUE/BLE Psychiatric: grossly normal mood and affect, speech fluent  and  appropriate Neurologic: grossly non-focal.          Labs on Admission:  Basic Metabolic Panel:  Recent Labs Lab 01/18/15 0740  NA 134*  K 4.7  CL 98*  CO2 26  GLUCOSE 170*  BUN 17  CREATININE 1.55*  CALCIUM 9.2   Liver Function Tests:  Recent Labs Lab 01/18/15 0740  AST 29  ALT 20  ALKPHOS 48  BILITOT 0.5  PROT 7.4  ALBUMIN 3.7   No results for input(s): LIPASE, AMYLASE in the last 168 hours. No results for input(s): AMMONIA in the last 168 hours. CBC:  Recent Labs Lab 01/18/15 0740  WBC 7.5  HGB 15.3  HCT 44.4  MCV 94.1  PLT 258   Cardiac Enzymes: No results for input(s): CKTOTAL, CKMB, CKMBINDEX, TROPONINI in the last 168 hours.  BNP (last 3 results)  Recent Labs  06/15/14 0139 06/29/14 1038 01/18/15 0740  BNP 1001.8* 1486.2* 682.3*    ProBNP (last 3 results) No results for input(s): PROBNP in the last 8760 hours.  CBG: No results for input(s): GLUCAP in the last 168 hours.  Radiological Exams on Admission: Dg Chest Portable 1 View  01/18/2015   CLINICAL DATA:  Shortness of breath.  EXAM: PORTABLE CHEST - 1 VIEW  COMPARISON:  07/03/2014  FINDINGS: The heart size and mediastinal contours are within normal limits. Both lungs are clear. No evidence of pneumothorax or pleural effusion. Pacemaker remains in stable position.  IMPRESSION: No active disease.   Electronically Signed   By: Earle Gell M.D.   On: 01/18/2015 09:17    EKG: Independently reviewed.   Assessment/Plan Principal Problem:   Shortness of breath Active Problems:   Atrial fibrillation and flutter   Cardiomyopathy, ischemic   Chronic anticoagulation   Biventricular implantable cardioverter-defibrillator in situ   Dyslipidemia   Paroxysmal ventricular tachycardia   Respiratory distress   1. Respiratory distress. Patient having extensive cardiac history including a history of ventricular tachycardia undergoing several ablation procedures, status post ICD placement. He  denies chest pain or ICD firing. Initial workup in the emergency room showed troponin within normal limits, chest x-ray did not show acute cardiopulmonary changes. Does not appear volume overloaded on exam. Will place him in overnight observation on continuous cardiac monitoring and plan to cycle troponins, obtain a transthoracic echocardiogram and repeat a chest x-ray in a.m. VQ scan performed in the emergency department is pending. Cardiology recommending 1 day of IV Lasix. 2. Ischemic cardiomyopathy. Last stress thoracic echocardiogram performed on 12/06/2013 showing ejection fraction 20-25%, with a grade 2 diastolic dysfunction. He presented with complaints of shortness of breath with EMS personnel auscultating crackles. As mentioned above patient will be admitted to telemetry, with cycling of troponins and obtain a transthoracic echocardiogram. I spoke with Dr. Marigene Ehlers of cardiology who recommended giving him 1 day of IV Lasix and if he did better tomorrow would transition back to his home regimen of Lasix at 20 mg by mouth twice a day 3. History of incessant mentioned her tachycardia. Patient undergoing several catheter ablations status post ACD placement. He denies ICD firing. Will have device interrogated. 4.  History of paroxysmal atrial flutter. History of cardioversion on 11/20/2014. Continue chronic anticoagulation with warfarin. Initial lab work showing a narrow therapeutic at 2.41 with PT of 26. Consult pharmacy for warfarin dosing. 5. Hypothyroidism. Will check a TSH and continue Synthroid at his home dose at 88 mcg PO q daily 6. S/P AICD placement. I spoke with Dr Marigene Ehlers  of cardiology who recommended having device interrogated in ER    Code Status: Full Code Family Communication: Spoke ot his wife who was present at bedside Disposition Plan: WIll place in overnight obs, do not anticipate him requiring greater than 2 nights hospitalization  Time spent: [place], [place]  [facility] Pager [phone]

## 2015-01-18 NOTE — ED Notes (Signed)
Patient transported to Nuclear Medicine 

## 2015-01-18 NOTE — ED Notes (Addendum)
hospitalist at bedside

## 2015-01-18 NOTE — ED Provider Notes (Signed)
CSN: [id number]     Arrival date & time 01/18/15  0731 History   First MD Initiated Contact with Patient 01/18/15 [phone]     Chief Complaint  Patient presents with  . Shortness of Breath     (Consider location/radiation/quality/duration/timing/severity/associated sxs/prior Treatment) HPI  A LEVEL 5 CAVEAT PERTAINS DUE TO URGENT NEED FOR INTERVENTION.  Pt presenting with c/o shortness of breath.  He states that yesterday sob was mild and he felt weak, then this morning he felt his lungs were filling up with fluid.  No chest pain.  He takes lasix bid and took one this morning at 6am.    Past Medical History  Diagnosis Date  . CAD (coronary artery disease) 1999    Remote anterolater and apical MI, cath 2011 patent stents RCA/LCx  LAD w/o obstruction  . Cardiomyopathy, ischemic     EF 20-25%  echo 2012  . Chronic systolic congestive heart failure   . ICD (implantable cardiac defibrillator) in place     [place]erald[facility] Jude  . 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 Abdominal aortic aneurysm 2009    Repaired with endovascular stent  . PVD (peripheral vascular disease)     Has occluded innominate vein  . Myocardial infarction   . Hypertension   . Ventricular tachycardia     recurrent slow VT at 100-110 bpm,  Rx mexilitene/sotalol  . Left bundle branch block   . Systolic CHF, chronic   . Arthritis   . Gynecomastia     2/2 spironolactone  . Atrial fibrillation   . Pericarditis     due to perforation  . Inguinal hernia recurrent bilateral   . GERD (gastroesophageal reflux disease)   . Anemia 05/2014  . Cardiac arrest 12/05/2013  . Acute on chronic systolic heart failure 11/06/6293  . 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 SVT (supraventricular tachycardia) 12/05/2013   Past Surgical History  Procedure Laterality Date  . Icd  Feb 2012    Change out with LV lead placed with tunneling from the right  to the left side  . Endovascular stent insertion      for  AAA  . Cardiac catheterization      x2  . Pacemaker insertion    . Ventricular ablation surgery      s/p prior EPS and ablation at [facility] 9/12, [facility] 11/12, and most recent at [facility] 08/23/11  . Tonsillectomy    . V-tach ablation N/A 08/24/2011    Procedure: V-TACH ABLATION;  Surgeon: Thompson Grayer, MD;  Location: [facility] CATH LAB;  Service: Cardiovascular;  Laterality: N/A;  . Inguinal hernia repair Bilateral 06/20/2014    Procedure: LAPAROSCOPIC BILATERAL INGUINAL HERNIA REPAIR;  Surgeon: Michael Boston, MD;  Location: [place];  Service: General;  Laterality: Bilateral;  . Insertion of mesh N/A 06/20/2014    Procedure: INSERTION OF MESH;  Surgeon: Michael Boston, MD;  Location: [place];  Service: General;  Laterality: N/A;  . Cardioversion N/A 07/05/2014    Procedure: CARDIOVERSION;  Surgeon: Larey Dresser, MD;  Location: [place];  Service: Cardiovascular;  Laterality: N/A;  . Cardioversion N/A 11/22/2014    Procedure: CARDIOVERSION;  Surgeon: Thayer Headings, MD;  Location: [facility] ENDOSCOPY;  Service: Cardiovascular;  Laterality: N/A;   Family History  Problem Relation Age of Onset  . Stroke Mother   . Heart attack Mother   . Heart disease Mother   . Hyperlipidemia  Mother   . Hypertension Mother   . Heart attack Father   . Heart disease Father   . Hyperlipidemia Father   . Hypertension Father   . Heart disease Sister   . Hyperlipidemia Sister   . Hypertension Brother    Social History  Substance Use Topics  . Smoking status: Former Smoker    Quit date: 01/07/1994  . Smokeless tobacco: Never Used  . Alcohol Use: Yes     Comment: very rare    Review of Systems  UNABLE TO OBTAIN ROS DUE TO LEVEL 5 CAVEAT    Allergies  Iohexol and Prednisone  Home Medications   Prior to Admission medications   Medication Sig Start Date End Date Taking? Authorizing Provider  acetaminophen (TYLENOL) 500 MG tablet Take 250 mg by mouth 2 (two) times daily.    Yes Historical Provider, MD   ALPRAZolam [name]) 0.25 MG tablet Takes 1/2 tab around 2AM, takes 1 tab at 6AM,  Takes 1/2 tab at 1030AM, takes 1 tab at [facility], takes 1/2 at [facility], and takes 1/2 tab at 9PM. Or as directed Patient taking differently: Take 0.125-0.25 mg by mouth 6 (six) times daily. Take 1/2 tablet (0.125 mg) at 2am, take 1 tablet (0.25 mg) at 6am, take 1/2 tablet (0.125 mg) at 10:30am, take 1 tablet (0.25 mg) at 2pm, take 1/2 tablet (0.125 mg) at 6pm, take 1/2 tablet (0.125 mg) at 9pm 11/19/14  Yes Darlin Coco, MD  Ascorbic Acid (VITAMIN C) 500 MG tablet Take 500 mg by mouth daily at 2 PM daily at 2 PM.    Yes Historical Provider, MD  carvedilol (COREG) 12.5 MG tablet Take 1 tablet (12.5 mg total) by mouth 2 (two) times daily. 10/17/14  Yes Darlin Coco, MD  docusate sodium (COLACE) 100 MG capsule Take 100 mg by mouth daily at 2 PM daily at 2 PM.   Yes Historical Provider, MD  furosemide (LASIX) 20 MG tablet Take 1 tablet (20 mg total) by mouth 2 (two) times daily. Patient taking differently: Take 20 mg by mouth 2 (two) times daily. 6am and 2pm 10/17/14  Yes Darlin Coco, MD  levothyroxine (SYNTHROID, LEVOTHROID) 88 MCG tablet Take 1 1/2 tablet (132 mcg) on Friday, take 1 tablet (88 mcg) on all other days of the week 11/29/14  Yes Darlin Coco, MD  loratadine (CLARITIN) 10 MG tablet Take 10 mg by mouth daily at 2 PM daily at 2 PM.    Yes Historical Provider, MD  losartan (COZAAR) 50 MG tablet Take 0.5 tablets (25 mg total) by mouth daily. Patient taking differently: Take 25 mg by mouth daily at 6 (six) AM.  10/17/14  Yes Darlin Coco, MD  meclizine (ANTIVERT) 25 MG tablet Take 25 mg by mouth 3 (three) times daily. 6am, 2pm, bedtime   Yes Historical Provider, MD  mexiletine (MEXITIL) 150 MG capsule Take 1 capsule (150 mg total) by mouth 3 (three) times daily. Patient taking differently: Take 150 mg by mouth 3 (three) times daily. 6am, 2pm, bedtime 10/17/14  Yes Darlin Coco, MD  Multiple Vitamin  (MULTIVITAMIN WITH MINERALS) TABS tablet Take 1 tablet by mouth daily at 2 PM daily at 2 PM.    Yes Historical Provider, MD  Omega-3 Fatty Acids (FISH OIL) 1000 MG CAPS Take 1,000 mg by mouth 2 (two) times daily. 6am and 6pm   Yes Historical Provider, MD  ranitidine (ZANTAC) 150 MG tablet Take 150 mg by mouth 4 (four) times daily.    Yes Historical  Provider, MD  rosuvastatin (CRESTOR) 5 MG tablet Take 1 tablet (5 mg total) by mouth 2 (two) times a week. Every Tuesday and Saturday Patient taking differently: Take 5 mg by mouth 2 (two) times a week. Every Tuesday and Saturday at 6pm 10/17/14  Yes Darlin Coco, MD  sotalol (BETAPACE) 120 MG tablet Take 1 tablet (120 mg total) by mouth 2 (two) times daily. Patient taking differently: Take 120 mg by mouth 2 (two) times daily. 6am, 6pm 10/17/14  Yes Darlin Coco, MD  warfarin (COUMADIN) 2.5 MG tablet Take as directed by Coumadin clinic Patient taking differently: Take 2.5-3.75 mg by mouth daily at 6 PM. Take 1 tablet (2.5 mg) on Tuesday and Saturday, take 1 1/2 tablets (3.75 mg) on Sunday, Monday, Wednesday, Thursday, Friday or  as directed by Coumadin clinic 10/17/14  Yes Darlin Coco, MD   BP 113/65 mmHg  Pulse 80  Temp(Src) 98.8 F (37.1 C) (Oral)  Resp 14  Ht 5\' 8"  (1.727 m)  Wt 169 lb 3.2 oz (76.749 kg)  BMI 25.73 kg/m2  SpO2 97%  Vitals reviewed Physical Exam  Physical Examination: General appearance - alert, well appearing, and in no distress Mental status - alert, oriented to person, place, and time Eyes - no conjunctival injection, no scleral icterus Mouth - mucous membranes moist, pharynx normal without lesions Chest - BSS, diffuse crackles, no wheezing, pt on CPAP on arrival Heart - normal rate, regular rhythm, normal S1, S2, no murmurs, rubs, clicks or gallops Abdomen - soft, nontender, nondistended, no masses or organomegaly Neurological - alert, oriented, normal speech Extremities - peripheral pulses normal, no pedal  edema, no clubbing or cyanosis Skin - normal coloration and turgor, no rashes  ED Course  Procedures (including critical care time) Labs Review Labs Reviewed  APTT - Abnormal; Notable for the following:    aPTT 42 (*)    All other components within normal limits  COMPREHENSIVE METABOLIC PANEL - Abnormal; Notable for the following:    Sodium 134 (*)    Chloride 98 (*)    Glucose, Bld 170 (*)    Creatinine, Ser 1.55 (*)    GFR calc non Af Amer 43 (*)    GFR calc Af Amer 50 (*)    All other components within normal limits  PROTIME-INR - Abnormal; Notable for the following:    Prothrombin Time 26.0 (*)    INR 2.41 (*)    All other components within normal limits  BRAIN NATRIURETIC PEPTIDE - Abnormal; Notable for the following:    B Natriuretic Peptide 682.3 (*)    All other components within normal limits  TROPONIN I - Abnormal; Notable for the following:    Troponin I 0.04 (*)    All other components within normal limits  TROPONIN I - Abnormal; Notable for the following:    Troponin I 0.05 (*)    All other components within normal limits  TROPONIN I - Abnormal; Notable for the following:    Troponin I 0.04 (*)    All other components within normal limits  BASIC METABOLIC PANEL - Abnormal; Notable for the following:    Chloride 100 (*)    Glucose, Bld 109 (*)    Creatinine, Ser 1.43 (*)    GFR calc non Af Amer 47 (*)    GFR calc Af Amer 55 (*)    All other components within normal limits  PROTIME-INR - Abnormal; Notable for the following:    Prothrombin Time 26.0 (*)  INR 2.41 (*)    All other components within normal limits  CBC  TSH  CBC  I-STAT TROPOININ, ED    Imaging Review Nm Pulmonary Perf And Vent  01/18/2015   CLINICAL DATA:  Shortness of breath.  EXAM: NUCLEAR MEDICINE VENTILATION - PERFUSION LUNG SCAN  TECHNIQUE: Ventilation images were obtained in multiple projections using inhaled aerosol Tc-35m DTPA. Perfusion images were obtained in multiple  projections after intravenous injection of Tc-40m MAA.  RADIOPHARMACEUTICALS:  43 mCi Technetium-66m DTPA aerosol inhalation and 5.9 mCi Technetium-83m MAA IV  COMPARISON:  Chest x-ray July 03, 2014  FINDINGS: Ventilation: No focal ventilation defect.  Perfusion: No wedge shaped peripheral perfusion defects to suggest acute pulmonary embolism.  IMPRESSION: Low probability for pulmonary embolus.   Electronically Signed   By: Abelardo Diesel M.D.   On: 01/18/2015 14:25   Dg Chest Portable 1 View  01/18/2015   CLINICAL DATA:  Shortness of breath.  EXAM: PORTABLE CHEST - 1 VIEW  COMPARISON:  07/03/2014  FINDINGS: The heart size and mediastinal contours are within normal limits. Both lungs are clear. No evidence of pneumothorax or pleural effusion. Pacemaker remains in stable position.  IMPRESSION: No active disease.   Electronically Signed   By: Earle Gell M.D.   On: 01/18/2015 09:17   I have personally reviewed and evaluated these images and lab results as part of my medical decision-making.   EKG Interpretation   Date/Time:  Saturday January 18 2015 07:33:36 EDT Ventricular Rate:  74 PR Interval:  71 QRS Duration: 169 QT Interval:  452 QTC Calculation: 501 R Axis:   -75 Text Interpretation:  Sinus rhythm Short PR interval IVCD, consider  atypical RBBB Inferior infarct, acute Probable anterior infarct, age  indeterminate Lateral leads are also involved Since previous tracing pacer  spikes are less evident Confirmed by Canary Brim  MD, [place] [phone]) on  01/18/2015 11:28:46 AM      MDM   Final diagnoses:  Shortness of breath    Pt presenting with acute onset of shortness of breath, states he feels his lungs are filling with fluid. Pt arrived on cpap, quickly weaned to nasal cannula.  Workup reassuring, troponin negative, EKG without acute changes from prior, BNP in 600s, no pulmonary edema on chest xray.  Pt cannot have ct angio of chest due to dye allergy and prednisone allergy- vq scan  ordered.    11:21 AM ct angio ordered due to possibility of PE given acute onset shortness of breath requiring cpap and no acute findings on xray.  Pt is allergic to IV contrast and also alllergic to prednisone so cannot premedicate.  Will need to pursue VQ scan.    Alfonzo Beers, MD 01/19/15 [phone]

## 2015-01-18 NOTE — ED Notes (Signed)
Patient transported to floor with cardiac monitoring by Nelson, NT

## 2015-01-18 NOTE — ED Notes (Signed)
Patient taken off bipap and placed on [place].

## 2015-01-18 NOTE — ED Notes (Addendum)
Patient returned from nuclear medicine.

## 2015-01-18 NOTE — Progress Notes (Signed)
ANTICOAGULATION CONSULT NOTE - Initial Consult  Pharmacy Consult for Coumadin Indication: atrial fibrillation  Allergies  Allergen Reactions  . Iohexol Hives and Itching     Code: HIVES, Desc: hives w/ itching during cardiac cath '99, mult caths since w/ ? premeds, DR.G.HAYES REQUESTS 13 HR PRE MED//A.C.pt okay w/13 hr prep/mms, Onset Date: [id number]   . Prednisone Hives    Patient Measurements: Height: 5\' 8"  (172.7 cm) Weight: 168 lb (76.204 kg) IBW/kg (Calculated) : 68.4  Vital Signs: Temp: 98.2 F (36.8 C) (08/20 0739) Temp Source: Oral (08/20 0739) BP: 125/68 mmHg (08/20 1459) Pulse Rate: 110 (08/20 1315)  Labs:  Recent Labs  01/18/15 0740  HGB 15.3  HCT 44.4  PLT 258  APTT 42*  LABPROT 26.0*  INR 2.41*  CREATININE 1.55*    Estimated Creatinine Clearance: 41.1 mL/min (by C-G formula based on Cr of 1.55).   Medical History: Past Medical History  Diagnosis Date  . CAD (coronary artery disease) 1999    Remote anterolater and apical MI, cath 2011 patent stents RCA/LCx  LAD w/o obstruction  . Cardiomyopathy, ischemic     EF 20-25%  echo 2012  . Chronic systolic congestive heart failure   . ICD (implantable cardiac defibrillator) in place     [place] Jude  . 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 Abdominal aortic aneurysm 2009    Repaired with endovascular stent  . PVD (peripheral vascular disease)     Has occluded innominate vein  . Myocardial infarction   . Hypertension   . Ventricular tachycardia     recurrent slow VT at 100-110 bpm,  Rx mexilitene/sotalol  . Left bundle branch block   . Systolic CHF, chronic   . Arthritis   . Gynecomastia     2/2 spironolactone  . Atrial fibrillation   . Pericarditis     due to perforation  . Inguinal hernia recurrent bilateral   . GERD (gastroesophageal reflux disease)   . Anemia 05/2014  . Cardiac arrest 12/05/2013  . Acute on chronic systolic heart failure 07/09/1914  . THYROTOXICOSIS 02/07/2009    Qualifier:  Diagnosis of  By: [name]    . SVT (supraventricular tachycardia) 12/05/2013    Assessment: 74 year old male admitted with SOB on Coumadin PTA for PAF Admit INR therapeutic on home dose of 2.5 mg Tuesday and Saturday, 3.75 mg other days Last dose 01/17/15  Goal of Therapy:  INR 2-3 Monitor platelets by anticoagulation protocol: Yes   Plan:  Resume home dose Daily INR  Thank you Anette Guarneri, PharmD [phone]  01/18/2015,3:10 PM

## 2015-01-18 NOTE — ED Notes (Signed)
Per EMS:  Woke up this am sob, increased sob.  Arrived ems, stitting in chair with moderate shortness of breath and crackles in all lung fields, denied cp, nusea, all other assoc symptoms other than sob.  1st 2 12 leads by ems showed stemi, thus stemi was paged.  cancellled stemi on arrival.  cpap was placed initially to some improvement.  20 gauge left hand.

## 2015-01-19 ENCOUNTER — Observation Stay (HOSPITAL_COMMUNITY): Payer: Medicare Other

## 2015-01-19 DIAGNOSIS — I255 Ischemic cardiomyopathy: Secondary | ICD-10-CM

## 2015-01-19 DIAGNOSIS — I4891 Unspecified atrial fibrillation: Secondary | ICD-10-CM

## 2015-01-19 DIAGNOSIS — Z95810 Presence of automatic (implantable) cardiac defibrillator: Secondary | ICD-10-CM

## 2015-01-19 DIAGNOSIS — Z7901 Long term (current) use of anticoagulants: Secondary | ICD-10-CM

## 2015-01-19 DIAGNOSIS — R0602 Shortness of breath: Secondary | ICD-10-CM

## 2015-01-19 DIAGNOSIS — I472 Ventricular tachycardia: Secondary | ICD-10-CM

## 2015-01-19 DIAGNOSIS — R0600 Dyspnea, unspecified: Secondary | ICD-10-CM

## 2015-01-19 DIAGNOSIS — I4892 Unspecified atrial flutter: Secondary | ICD-10-CM

## 2015-01-19 LAB — CBC
HCT: 41.5 % (ref 39.0–52.0)
HEMOGLOBIN: 14.2 g/dL (ref 13.0–17.0)
MCH: 32.6 pg (ref 26.0–34.0)
MCHC: 34.2 g/dL (ref 30.0–36.0)
MCV: 95.2 fL (ref 78.0–100.0)
PLATELETS: 211 10*3/uL (ref 150–400)
RBC: 4.36 MIL/uL (ref 4.22–5.81)
RDW: 12.6 % (ref 11.5–15.5)
WBC: 9.7 10*3/uL (ref 4.0–10.5)

## 2015-01-19 LAB — BASIC METABOLIC PANEL
ANION GAP: 9 (ref 5–15)
BUN: 19 mg/dL (ref 6–20)
CALCIUM: 9.3 mg/dL (ref 8.9–10.3)
CO2: 29 mmol/L (ref 22–32)
CREATININE: 1.43 mg/dL — AB (ref 0.61–1.24)
Chloride: 100 mmol/L — ABNORMAL LOW (ref 101–111)
GFR, EST AFRICAN AMERICAN: 55 mL/min — AB (ref >60)
GFR, EST NON AFRICAN AMERICAN: 47 mL/min — AB (ref >60)
GLUCOSE: 109 mg/dL — AB (ref 65–99)
Potassium: 4.1 mmol/L (ref 3.5–5.1)
Sodium: 138 mmol/L (ref 135–145)

## 2015-01-19 LAB — PROTIME-INR
INR: 2.41 — AB (ref 0.00–1.49)
PROTHROMBIN TIME: 26 s — AB (ref 11.6–15.2)

## 2015-01-19 LAB — TROPONIN I: TROPONIN I: 0.04 ng/mL — AB (ref <0.031)

## 2015-01-19 MED ORDER — DIPHENHYDRAMINE HCL 25 MG PO CAPS
25.0000 mg | ORAL_CAPSULE | Freq: Once | ORAL | Status: AC
  Administered 2015-01-19: 25 mg via ORAL
  Filled 2015-01-19: qty 1

## 2015-01-19 NOTE — Discharge Summary (Signed)
Physician Discharge Summary  [name] NTI:[id number] DOB: Apr 14, 1941 DOA: 01/18/2015  PCP: Sheela Stack, MD  Admit date: 01/18/2015 Discharge date: 01/19/2015  Time spent: 40 minutes  Recommendations for Outpatient Follow-up:  1. Follow-up with Dr. Mare Ferrari within one week. 2. Follow-up with primary care physician within one week. 3. Patient has follow-up with [facility] tomorrow  Discharge Diagnoses:  Principal Problem:   Shortness of breath Active Problems:   Cardiomyopathy, ischemic   Chronic anticoagulation   Biventricular implantable cardioverter-defibrillator in situ   Dyslipidemia   Paroxysmal ventricular tachycardia   Atrial fibrillation and flutter   Respiratory distress   Discharge Condition: Stable  Diet recommendation: Heart healthy  Filed Weights   01/18/15 0739 01/19/15 0500  Weight: 76.204 kg (168 lb) 76.749 kg (169 lb 3.2 oz)    History of present illness:  [name] is a 74 y.o. male with a past medical history of myocardial infarction, ischemic cardiomyopathy, left ventricular apical aneurysm, status post defibrillator placement for ventricular tachycardia, had incessant ventricular tachycardia undergoing several catheter ablations. He reported waking up this morning with increased shortness of breath. Family members called EMS finding patient to be sitting in chair with evidence of respiratory distress along with audible crackles. Initial workup included a chest x-ray which did not reveal acute cardiac pulmonary disease. Blood work revealed a BNP of 682 (previously 1486 on 06/29/2014) troponin of 0.02. He reports taking Lasix 20 g by mouth twice a day take his last dose this morning. During my encounter he reports feeling better and states breathing easier. He feels that treatment with nebulizers helped. He denies chest pain, fevers, chills, nausea, vomiting, abdominal pain, dysuria, hematuria.  Hospital Course:   1. Respiratory distress. Patient having extensive cardiac history including a history of ventricular tachycardia undergoing several ablation procedures, status post ICD placement. He denies chest pain or ICD firing. Initial workup in the emergency room showed troponin within normal limits, chest x-ray did not show acute cardiopulmonary changes. Does not appear volume overloaded on exam. Will place him in overnight observation on continuous cardiac monitoring and plan to cycle troponins, obtain a transthoracic echocardiogram and repeat a chest x-ray in a.m. VQ scan performed in the emergency department is pending. Cardiology recommending 1 day of IV Lasix. He did very well after that and discharged home to follow-up with primary care physician, patient might need more adjustment in his Lasix regimen as outpatient. 2. Ischemic cardiomyopathy. Last stress thoracic echocardiogram performed on 12/06/2013 showing ejection fraction 20-25%, with a grade 2 diastolic dysfunction. Presented with mild shortness of breath, patient thought he can hear crackles, given 1 dose of IV Lasix, back to normal all discharged today to follow-up with primary cardiologist. 3. History of ventricular tachycardia. Patient undergoing several catheter ablations status post AICD placement. He denies ICD firing. Device was not interrogated but the telemetry showed several ventricular beats not longer than 9 beats. 4. History of paroxysmal atrial flutter. History of cardioversion on 11/20/2014. Continue chronic anticoagulation with warfarin. INR is therapeutic at 2.41. Continue home dose of warfarin 5. Hypothyroidism. TSH normal at 1.252, on Synthroid as his home dose at 88 mcg PO q daily 6. S/P AICD placement. Cardiology recommended over the phone to interrogated the pacemaker, this is was not done in the ED.  Procedures:  None  Consultations:  None  Discharge Exam: Filed Vitals:   01/19/15 0907  BP: 103/54  Pulse: 74  Temp:    Resp:    General: Alert  and awake, oriented x3, not in any acute distress. HEENT: anicteric sclera, pupils reactive to light and accommodation, EOMI CVS: S1-S2 clear, no murmur rubs or gallops Chest: clear to auscultation bilaterally, no wheezing, rales or rhonchi Abdomen: soft nontender, nondistended, normal bowel sounds, no organomegaly Extremities: no cyanosis, clubbing or edema noted bilaterally Neuro: Cranial nerves II-XII intact, no focal neurological deficits  Discharge Instructions   Discharge Instructions    Diet - low sodium heart healthy    Complete by:  As directed      Increase activity slowly    Complete by:  As directed           Current Discharge Medication List    CONTINUE these medications which have NOT CHANGED   Details  acetaminophen (TYLENOL) 500 MG tablet Take 250 mg by mouth 2 (two) times daily.     ALPRAZolam (XANAX) 0.25 MG tablet Takes 1/2 tab around 2AM, takes 1 tab at 6AM,  Takes 1/2 tab at 1030AM, takes 1 tab at [facility], takes 1/2 at [facility], and takes 1/2 tab at 9PM. Or as directed Qty: 360 tablet, Refills: 1    Ascorbic Acid (VITAMIN C) 500 MG tablet Take 500 mg by mouth daily at 2 PM daily at 2 PM.     carvedilol (COREG) 12.5 MG tablet Take 1 tablet (12.5 mg total) by mouth 2 (two) times daily. Qty: 180 tablet, Refills: 3    docusate sodium (COLACE) 100 MG capsule Take 100 mg by mouth daily at 2 PM daily at 2 PM.    furosemide (LASIX) 20 MG tablet Take 1 tablet (20 mg total) by mouth 2 (two) times daily. Qty: 180 tablet, Refills: 3    levothyroxine (SYNTHROID, LEVOTHROID) 88 MCG tablet Take 1 1/2 tablet (132 mcg) on Friday, take 1 tablet (88 mcg) on all other days of the week Qty: 100 tablet, Refills: 3    loratadine (CLARITIN) 10 MG tablet Take 10 mg by mouth daily at 2 PM daily at 2 PM.     losartan (COZAAR) 50 MG tablet Take 0.5 tablets (25 mg total) by mouth daily. Qty: 45 tablet, Refills: 3    meclizine (ANTIVERT) 25 MG tablet Take 25 mg  by mouth 3 (three) times daily. 6am, 2pm, bedtime    mexiletine (MEXITIL) 150 MG capsule Take 1 capsule (150 mg total) by mouth 3 (three) times daily. Qty: 270 capsule, Refills: 3    Multiple Vitamin (MULTIVITAMIN WITH MINERALS) TABS tablet Take 1 tablet by mouth daily at 2 PM daily at 2 PM.     Omega-3 Fatty Acids (FISH OIL) 1000 MG CAPS Take 1,000 mg by mouth 2 (two) times daily. 6am and 6pm    ranitidine (ZANTAC) 150 MG tablet Take 150 mg by mouth 4 (four) times daily.     rosuvastatin (CRESTOR) 5 MG tablet Take 1 tablet (5 mg total) by mouth 2 (two) times a week. Every Tuesday and Saturday Qty: 30 tablet, Refills: 3    sotalol (BETAPACE) 120 MG tablet Take 1 tablet (120 mg total) by mouth 2 (two) times daily. Qty: 180 tablet, Refills: 3    warfarin (COUMADIN) 2.5 MG tablet Take as directed by Coumadin clinic Qty: 120 tablet, Refills: 1       Allergies  Allergen Reactions  . Iohexol Hives and Itching     Code: HIVES, Desc: hives w/ itching during cardiac cath '99, mult caths since w/ ? premeds, DR.G.HAYES REQUESTS 13 HR PRE MED//A.C.pt okay w/13 hr prep/mms, Onset Date:  [id number]   . Prednisone Hives   Follow-up Information    Follow up with Warren Danes, MD In 1 week.   Specialty:  Cardiology   Contact information:   [facility] [address] [address] [phone]        The results of significant diagnostics from this hospitalization (including imaging, microbiology, ancillary and laboratory) are listed below for reference.    Significant Diagnostic Studies: X-ray Chest Pa And Lateral  01/19/2015   CLINICAL DATA:  Patient with shortness of breath.  EXAM: CHEST  2 VIEW  COMPARISON:  Chest radiograph 01/18/2015  FINDINGS: Multi lead AICD device overlies the left hemi thorax, leads stable in position. Monitoring leads overlie the patient. Stable cardiac and mediastinal contours. No consolidative pulmonary opacities. No pleural effusion or pneumothorax.  Mid thoracic spine degenerative changes.  IMPRESSION: No acute cardiopulmonary process.   Electronically Signed   By: Lovey Newcomer M.D.   On: 01/19/2015 09:52   Nm Pulmonary Perf And Vent  01/18/2015   CLINICAL DATA:  Shortness of breath.  EXAM: NUCLEAR MEDICINE VENTILATION - PERFUSION LUNG SCAN  TECHNIQUE: Ventilation images were obtained in multiple projections using inhaled aerosol Tc-86m DTPA. Perfusion images were obtained in multiple projections after intravenous injection of Tc-37m MAA.  RADIOPHARMACEUTICALS:  43 mCi Technetium-2m DTPA aerosol inhalation and 5.9 mCi Technetium-16m MAA IV  COMPARISON:  Chest x-ray July 03, 2014  FINDINGS: Ventilation: No focal ventilation defect.  Perfusion: No wedge shaped peripheral perfusion defects to suggest acute pulmonary embolism.  IMPRESSION: Low probability for pulmonary embolus.   Electronically Signed   By: Abelardo Diesel M.D.   On: 01/18/2015 14:25   Dg Chest Portable 1 View  01/18/2015   CLINICAL DATA:  Shortness of breath.  EXAM: PORTABLE CHEST - 1 VIEW  COMPARISON:  07/03/2014  FINDINGS: The heart size and mediastinal contours are within normal limits. Both lungs are clear. No evidence of pneumothorax or pleural effusion. Pacemaker remains in stable position.  IMPRESSION: No active disease.   Electronically Signed   By: Earle Gell M.D.   On: 01/18/2015 09:17    Microbiology: No results found for this or any previous visit (from the past 240 hour(s)).   Labs: Basic Metabolic Panel:  Recent Labs Lab 01/18/15 0740 01/19/15 0305  NA 134* 138  K 4.7 4.1  CL 98* 100*  CO2 26 29  GLUCOSE 170* 109*  BUN 17 19  CREATININE 1.55* 1.43*  CALCIUM 9.2 9.3   Liver Function Tests:  Recent Labs Lab 01/18/15 0740  AST 29  ALT 20  ALKPHOS 48  BILITOT 0.5  PROT 7.4  ALBUMIN 3.7   No results for input(s): LIPASE, AMYLASE in the last 168 hours. No results for input(s): AMMONIA in the last 168 hours. CBC:  Recent Labs Lab 01/18/15 0740  01/19/15 0305  WBC 7.5 9.7  HGB 15.3 14.2  HCT 44.4 41.5  MCV 94.1 95.2  PLT 258 211   Cardiac Enzymes:  Recent Labs Lab 01/18/15 1530 01/18/15 2040 01/19/15 0305  TROPONINI 0.04* 0.05* 0.04*   BNP: BNP (last 3 results)  Recent Labs  06/15/14 0139 06/29/14 1038 01/18/15 0740  BNP 1001.8* 1486.2* 682.3*    ProBNP (last 3 results) No results for input(s): PROBNP in the last 8760 hours.  CBG: No results for input(s): GLUCAP in the last 168 hours.     Signed:  Annet Manukyan A  [facility] 01/19/2015, 11:30 AM

## 2015-01-19 NOTE — Progress Notes (Signed)
[place] for Coumadin Indication: atrial fibrillation Labs:  Recent Labs  01/18/15 0740 01/18/15 1530 01/18/15 2040 01/19/15 0305  HGB 15.3  --   --  14.2  HCT 44.4  --   --  41.5  PLT 258  --   --  211  APTT 42*  --   --   --   LABPROT 26.0*  --   --  26.0*  INR 2.41*  --   --  2.41*  CREATININE 1.55*  --   --  1.43*  TROPONINI  --  0.04* 0.05* 0.04*    Estimated Creatinine Clearance: 44.5 mL/min (by C-G formula based on Cr of 1.43).  Assessment: 74 year old male admitted with SOB on Coumadin PTA for PAF Admit INR therapeutic on home dose of 2.5 mg Tuesday and Saturday, 3.75 mg other days Last dose 01/17/15  Goal of Therapy:  INR 2-3 Monitor platelets by anticoagulation protocol: Yes   Plan:  Continue home dose-3.75mg  due tonight Daily INR  Thank you Erin Hearing PharmD., BCPS Clinical Pharmacist Pager [phone] 01/19/2015 8:30 AM

## 2015-01-19 NOTE — Progress Notes (Signed)
Pt has been resting in bed and has denied any sob or cp. Pt has had runs of wide QRS with noted pacer spikes. Trops 0.04, 0.05. Np on call made aware. No new orders received.  Will cont to monitor pt

## 2015-01-19 NOTE — Discharge Instructions (Signed)

## 2015-01-20 ENCOUNTER — Ambulatory Visit (HOSPITAL_COMMUNITY)
Admission: RE | Admit: 2015-01-20 | Discharge: 2015-01-20 | Disposition: A | Discharge status: Home / Self Care | Payer: Medicare Other | Source: Ambulatory Visit | Attending: Surgery | Admitting: Surgery

## 2015-01-20 ENCOUNTER — Ambulatory Visit (INDEPENDENT_AMBULATORY_CARE_PROVIDER_SITE_OTHER): Payer: Medicare Other | Admitting: Surgery

## 2015-01-20 ENCOUNTER — Encounter: Payer: Self-pay | Admitting: Surgery

## 2015-01-20 ENCOUNTER — Telehealth: Payer: Self-pay | Admitting: Cardiology

## 2015-01-20 VITALS — BP 106/71 | HR 70 | Temp 97.7°F | Resp 16 | Ht 68.0 in | Wt 164.6 lb

## 2015-01-20 DIAGNOSIS — Z95828 Presence of other vascular implants and grafts: Secondary | ICD-10-CM | POA: Insufficient documentation

## 2015-01-20 DIAGNOSIS — I714 Abdominal aortic aneurysm, without rupture, unspecified: Secondary | ICD-10-CM

## 2015-01-20 DIAGNOSIS — I255 Ischemic cardiomyopathy: Secondary | ICD-10-CM | POA: Diagnosis not present

## 2015-01-20 NOTE — Progress Notes (Signed)
Patient name: [name] MRN: [id number] DOB: Apr 29, 1941 Sex: male     Chief Complaint  Patient presents with  . AAA    Patient is here for Annual 1 year check up.     HISTORY OF PRESENT ILLNESS: The patient is back forfollowup of his endovascular aneurysm repair which was done 11/24/2007 by Dr. Amedeo Plenty. A Endologix device was placed. He has had continued decrease in the size of his aneurysm sac.  He denies any abdominal pain.  At his last visit he was complaining of pain in some reticular veins in his right thigh.  He did discuss this some with Thea Silversmith, however no procedure was done.  They are not bothering him that much anymore.  He was recently discharged for a CHF exacerbation.  Otherwise he has been doing very well.  Past Medical History  Diagnosis Date  . CAD (coronary artery disease) 1999    Remote anterolater and apical MI, cath 2011 patent stents RCA/LCx  LAD w/o obstruction  . Cardiomyopathy, ischemic     EF 20-25%  echo 2012  . Chronic systolic congestive heart failure   . ICD (implantable cardiac defibrillator) in place     [facility]  . 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 Abdominal aortic aneurysm 2009    Repaired with endovascular stent  . PVD (peripheral vascular disease)     Has occluded innominate vein  . Myocardial infarction   . Hypertension   . Ventricular tachycardia     recurrent slow VT at 100-110 bpm,  Rx mexilitene/sotalol  . Left bundle branch block   . Systolic CHF, chronic   . Arthritis   . Gynecomastia     2/2 spironolactone  . Atrial fibrillation   . Pericarditis     due to perforation  . Inguinal hernia recurrent bilateral   . GERD (gastroesophageal reflux disease)   . Anemia 05/2014  . Cardiac arrest 12/05/2013  . Acute on chronic systolic heart failure 06/09/5091  . 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 SVT (supraventricular tachycardia) 12/05/2013    Past Surgical History  Procedure  Laterality Date  . Icd  Feb 2012    Change out with LV lead placed with tunneling from the right  to the left side  . Endovascular stent insertion      for AAA  . Cardiac catheterization      x2  . Pacemaker insertion    . Ventricular ablation surgery      s/p prior EPS and ablation at [facility] 9/12, [facility] 11/12, and most recent [facility] 08/23/11  . Tonsillectomy    . V-tach ablation N/A 08/24/2011    Procedure: V-TACH ABLATION;  Surgeon: Thompson Grayer, MD;  Location: [facility] CATH LAB;  Service: Cardiovascular;  Laterality: N/A;  . Inguinal hernia repair Bilateral 06/20/2014    Procedure: LAPAROSCOPIC BILATERAL INGUINAL HERNIA REPAIR;  Surgeon: Michael Boston, MD;  Location: [place];  Service: General;  Laterality: Bilateral;  . Insertion of mesh N/A 06/20/2014    Procedure: INSERTION OF MESH;  Surgeon: Michael Boston, MD;  Location: [place];  Service: General;  Laterality: N/A;  . Cardioversion N/A 07/05/2014    Procedure: CARDIOVERSION;  Surgeon: Larey Dresser, MD;  Location: [place];  Service: Cardiovascular;  Laterality: N/A;  . Cardioversion N/A 11/22/2014    Procedure: CARDIOVERSION;  Surgeon: Thayer Headings, MD;  Location: [place];  Service: Cardiovascular;  Laterality: N/A;    Social History   Social History  . Marital Status: Married    Spouse Name: N/A  . Number of Children: N/A  . Years of Education: N/A   Occupational History  . retired      used to Armed forces operational officer   Social History Main Topics  . Smoking status: Former Smoker    Quit date: 01/07/1994  . Smokeless tobacco: Never Used  . Alcohol Use: 0.0 oz/week    0 Standard drinks or equivalent per week     Comment: very rare  . Drug Use: No  . Sexual Activity: Not Currently   Other Topics Concern  . Not on file   Social History Narrative    Family History  Problem Relation Age of Onset  . Stroke Mother   . Heart attack Mother   . Heart disease Mother   . Hyperlipidemia Mother   . Hypertension Mother    . Heart attack Father   . Heart disease Father   . Hyperlipidemia Father   . Hypertension Father   . Heart disease Sister   . Hyperlipidemia Sister   . Hypertension Brother     Allergies as of 01/20/2015 - Review Complete 01/20/2015  Allergen Reaction Noted  . Iohexol Hives and Itching 10/19/2007  . Prednisone Hives     Current Outpatient Prescriptions on File Prior to Visit  Medication Sig Dispense Refill  . acetaminophen (TYLENOL) 500 MG tablet Take 250 mg by mouth 2 (two) times daily.     [name] ALPRAZolam (XANAX) 0.25 MG tablet Takes 1/2 tab around 2AM, takes 1 tab at 6AM,  Takes 1/2 tab at 1030AM, takes 1 tab at [facility], takes 1/2 at [facility], and takes 1/2 tab at 9PM. Or as directed (Patient taking differently: Take 0.125-0.25 mg by mouth 6 (six) times daily. Take 1/2 tablet (0.125 mg) at 2am, take 1 tablet (0.25 mg) at 6am, take 1/2 tablet (0.125 mg) at 10:30am, take 1 tablet (0.25 mg) at 2pm, take 1/2 tablet (0.125 mg) at 6pm, take 1/2 tablet (0.125 mg) at 9pm) 360 tablet 1  . Ascorbic Acid (VITAMIN C) 500 MG tablet Take 500 mg by mouth daily at 2 PM daily at 2 PM.     . carvedilol (COREG) 12.5 MG tablet Take 1 tablet (12.5 mg total) by mouth 2 (two) times daily. 180 tablet 3  . docusate sodium (COLACE) 100 MG capsule Take 100 mg by mouth daily at 2 PM daily at 2 PM.    . furosemide (LASIX) 20 MG tablet Take 1 tablet (20 mg total) by mouth 2 (two) times daily. (Patient taking differently: Take 20 mg by mouth 2 (two) times daily. 6am and 2pm) 180 tablet 3  . levothyroxine (SYNTHROID, LEVOTHROID) 88 MCG tablet Take 1 1/2 tablet (132 mcg) on Friday, take 1 tablet (88 mcg) on all other days of the week 100 tablet 3  . loratadine (CLARITIN) 10 MG tablet Take 10 mg by mouth daily at 2 PM daily at 2 PM.     . losartan (COZAAR) 50 MG tablet Take 0.5 tablets (25 mg total) by mouth daily. (Patient taking differently: Take 25 mg by mouth daily at 6 (six) AM. ) 45 tablet 3  . meclizine (ANTIVERT) 25 MG  tablet Take 25 mg by mouth 3 (three) times daily. 6am, 2pm, bedtime    . mexiletine (MEXITIL) 150 MG capsule Take 1 capsule (150 mg total) by mouth 3 (three) times daily. (Patient taking  differently: Take 150 mg by mouth 3 (three) times daily. 6am, 2pm, bedtime) 270 capsule 3  . Multiple Vitamin (MULTIVITAMIN WITH MINERALS) TABS tablet Take 1 tablet by mouth daily at 2 PM daily at 2 PM.     . Omega-3 Fatty Acids (FISH OIL) 1000 MG CAPS Take 1,000 mg by mouth 2 (two) times daily. 6am and 6pm    . ranitidine (ZANTAC) 150 MG tablet Take 150 mg by mouth 4 (four) times daily.     . rosuvastatin (CRESTOR) 5 MG tablet Take 1 tablet (5 mg total) by mouth 2 (two) times a week. Every Tuesday and Saturday (Patient taking differently: Take 5 mg by mouth 2 (two) times a week. Every Tuesday and Saturday at 6pm) 30 tablet 3  . sotalol (BETAPACE) 120 MG tablet Take 1 tablet (120 mg total) by mouth 2 (two) times daily. (Patient taking differently: Take 120 mg by mouth 2 (two) times daily. 6am, 6pm) 180 tablet 3  . warfarin (COUMADIN) 2.5 MG tablet Take as directed by Coumadin clinic (Patient taking differently: Take 2.5-3.75 mg by mouth daily at 6 PM. Take 1 tablet (2.5 mg) on Tuesday and Saturday, take 1 1/2 tablets (3.75 mg) on Sunday, Monday, Wednesday, Thursday, Friday or  as directed by Coumadin clinic) 120 tablet 1   No current facility-administered medications on file prior to visit.     REVIEW OF SYSTEMS: Please see history of present illness, otherwise all systems negative  PHYSICAL EXAMINATION:   Vital signs are  Filed Vitals:   01/20/15 0854  BP: 106/71  Pulse: 70  Temp: 97.7 F (36.5 C)  TempSrc: Oral  Resp: 16  Height: 5\' 8"  (1.727 m)  Weight: 164 lb 9.6 oz (74.662 kg)  SpO2: 99%   Body mass index is 25.03 kg/(m^2). General: The patient appears their stated age. HEENT:  No gross abnormalities Pulmonary:  Non labored breathing Abdomen: Soft and non-tender.  No pulsatile  mass Musculoskeletal: There are no major deformities. Neurologic: No focal weakness or paresthesias are detected, Skin: There are no ulcer or rashes noted. Psychiatric: The patient has normal affect. Cardiovascular: There is a regular rate and rhythm without significant murmur appreciated.  Palpable pedal pulses.  No carotid bruits   Diagnostic Studies I have reviewed his ultrasound.  Limited evaluation of his abdominal aorta secondary to overlying bowel gas, however there is no significant change.  Maximum aortic diameter is 3.1 cm.  Assessment: Status post endovascular aneurysm repair Plan: Patient continues to do very well.  His aneurysm sac remained stable with maximum diameter 3.1 cm.  He will continue with annual ultrasound surveillance.  Eldridge Abrahams, M.D. [facility] Office: [phone] Pager:  [phone]

## 2015-01-20 NOTE — Addendum Note (Signed)
Addended by: Dorthula Rue L on: 01/20/2015 02:03 PM   Modules accepted: Orders

## 2015-01-20 NOTE — Telephone Encounter (Signed)
Pt states he was hospitalized over the weekend because his lungs were filling with fluid, he was discharged yesterday, pt states he is feeling better. Pt states he is calling today because he was told to schedule an appt with Dr Mare Ferrari this week.

## 2015-01-20 NOTE — Telephone Encounter (Signed)
Pt given appt 01/21/15 10:30AM with Kathrene Alu

## 2015-01-20 NOTE — Telephone Encounter (Signed)
New message      Talk to a nurse regarding weekend hosp visit.  Pt had elevated HR and fluid in his lungs.

## 2015-01-21 ENCOUNTER — Encounter: Payer: Self-pay | Admitting: Internal Medicine

## 2015-01-21 ENCOUNTER — Other Ambulatory Visit: Payer: Self-pay | Admitting: Cardiology

## 2015-01-21 ENCOUNTER — Encounter: Payer: Self-pay | Admitting: Nurse Practitioner

## 2015-01-21 ENCOUNTER — Ambulatory Visit (INDEPENDENT_AMBULATORY_CARE_PROVIDER_SITE_OTHER): Payer: Medicare Other | Admitting: Nurse Practitioner

## 2015-01-21 VITALS — BP 100/70 | HR 74 | Ht 68.0 in | Wt 165.8 lb

## 2015-01-21 DIAGNOSIS — I5022 Chronic systolic (congestive) heart failure: Secondary | ICD-10-CM

## 2015-01-21 DIAGNOSIS — I255 Ischemic cardiomyopathy: Secondary | ICD-10-CM

## 2015-01-21 DIAGNOSIS — R42 Dizziness and giddiness: Secondary | ICD-10-CM

## 2015-01-21 DIAGNOSIS — I259 Chronic ischemic heart disease, unspecified: Secondary | ICD-10-CM

## 2015-01-21 DIAGNOSIS — I10 Essential (primary) hypertension: Secondary | ICD-10-CM

## 2015-01-21 NOTE — Progress Notes (Signed)
CARDIOLOGY OFFICE NOTE  Date:  01/21/2015    [name] Date of Birth: Oct 13, 1940 Medical Record #[id number]  PCP:  Sheela Stack, MD  Cardiologist:  Mare Ferrari    Chief Complaint  Patient presents with  . Congestive Heart Failure    Follow up visit from ER - seen for Dr. Mare Ferrari    History of Present Illness: [name] is a 74 y.o. male who presents today for a follow up visit after an ER/hospital visit. Seen for Dr. Mare Ferrari.   He has a history of a remote anterior wall myocardial infarction. He has an ischemic cardiomyopathy. He has a left ventricular apical aneurysm. He has had a defibrillator for ventricular tachycardia. He has been followed closely by Dr. Caryl Comes and Dr. Rayann Heman. 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 Subsequently he had recurrent ventricular tachycardia and was transferred to [facility] for incessant VT and underwent a second ablation procedure complicated by pericardial perforation and pericarditis. He underwent a third catheter VT ablation procedure in March 2013 by Dr. Rayann Heman.   His last echocardiogram on 12/06/13 showed an ejection fraction of 20-25% and there was an apical aneurysm.  He has had past issues with anemia following hernia surgery early this year. He has had atrial flutter and is now on Sotalol and Mexitil. He has HLD, HTN, LBBB, GERD, and PVD.  Presented this past weekend with respiratory distress. Did not appear to be volume overloaded. Watched overnight. Received one dose of IV lasix. Then sent back home for further outpatient follow up. BNP actually lower than his usual level.   Comes in today. Here with his wife today. He says he is feeling better. He does a great job with restricting his salt. He had gotten a Yetti cup and was drinking more fluid. Did not realize that that could make him hold on to fluid. He also noted that his HR was higher last week. Sent in a transmission but then  played phone tag with device clinic here. Then ended up going to to ER.   Now feels ok. No chest pain. Not swelling. No shocks.    Past Medical History  Diagnosis Date  . CAD (coronary artery disease) 1999    Remote anterolater and apical MI, cath 2011 patent stents RCA/LCx  LAD w/o obstruction  . Cardiomyopathy, ischemic     EF 20-25%  echo 2012  . Chronic systolic congestive heart failure   . ICD (implantable cardiac defibrillator) in place     [place] [facility]  . 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 Abdominal aortic aneurysm 2009    Repaired with endovascular stent  . PVD (peripheral vascular disease)     Has occluded innominate vein  . Myocardial infarction   . Hypertension   . Ventricular tachycardia     recurrent slow VT at 100-110 bpm,  Rx mexilitene/sotalol  . Left bundle branch block   . Systolic CHF, chronic   . Arthritis   . Gynecomastia     2/2 spironolactone  . Atrial fibrillation   . Pericarditis     due to perforation  . Inguinal hernia recurrent bilateral   . GERD (gastroesophageal reflux disease)   . Anemia 05/2014  . Cardiac arrest 12/05/2013  . Acute on chronic systolic heart failure 0/07/5007  . 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 SVT (supraventricular tachycardia) 12/05/2013  Past Surgical History  Procedure Laterality Date  . Icd  Feb 2012    Change out with LV lead placed with tunneling from the right  to the left side  . Endovascular stent insertion      for AAA  . Cardiac catheterization      x2  . Pacemaker insertion    . Ventricular ablation surgery      s/p prior EPS and ablation at [facility] 9/12, [facility] 11/12, and most recent at [facility] 08/23/11  . Tonsillectomy    . V-tach ablation N/A 08/24/2011    Procedure: V-TACH ABLATION;  Surgeon: Thompson Grayer, MD;  Location: [facility] CATH LAB;  Service: Cardiovascular;  Laterality: N/A;  . Inguinal hernia repair Bilateral 06/20/2014    Procedure: LAPAROSCOPIC  BILATERAL INGUINAL HERNIA REPAIR;  Surgeon: Michael Boston, MD;  Location: [place];  Service: General;  Laterality: Bilateral;  . Insertion of mesh N/A 06/20/2014    Procedure: INSERTION OF MESH;  Surgeon: Michael Boston, MD;  Location: [place];  Service: General;  Laterality: N/A;  . Cardioversion N/A 07/05/2014    Procedure: CARDIOVERSION;  Surgeon: Larey Dresser, MD;  Location: [place];  Service: Cardiovascular;  Laterality: N/A;  . Cardioversion N/A 11/22/2014    Procedure: CARDIOVERSION;  Surgeon: Thayer Headings, MD;  Location: [facility] ENDOSCOPY;  Service: Cardiovascular;  Laterality: N/A;     Medications: Current Outpatient Prescriptions  Medication Sig Dispense Refill  . acetaminophen (TYLENOL) 500 MG tablet Take 250 mg by mouth 2 (two) times daily.     [name] ALPRAZolam (XANAX) 0.25 MG tablet Takes 1/2 tab around 2AM, takes 1 tab at 6AM,  Takes 1/2 tab at 1030AM, takes 1 tab at [facility], takes 1/2 at [facility], and takes 1/2 tab at 9PM. Or as directed (Patient taking differently: Take 0.125-0.25 mg by mouth 6 (six) times daily. Take 1/2 tablet (0.125 mg) at 2am, take 1 tablet (0.25 mg) at 6am, take 1/2 tablet (0.125 mg) at 10:30am, take 1 tablet (0.25 mg) at 2pm, take 1/2 tablet (0.125 mg) at 6pm, take 1/2 tablet (0.125 mg) at 9pm) 360 tablet 1  . Ascorbic Acid (VITAMIN C) 500 MG tablet Take 500 mg by mouth daily at 2 PM daily at 2 PM.     . carvedilol (COREG) 12.5 MG tablet Take 1 tablet (12.5 mg total) by mouth 2 (two) times daily. 180 tablet 3  . docusate sodium (COLACE) 100 MG capsule Take 100 mg by mouth daily at 2 PM daily at 2 PM.    . furosemide (LASIX) 20 MG tablet Take 1 tablet (20 mg total) by mouth 2 (two) times daily. (Patient taking differently: Take 20 mg by mouth 2 (two) times daily. 6am and 2pm) 180 tablet 3  . levothyroxine (SYNTHROID, LEVOTHROID) 88 MCG tablet Take 1 1/2 tablet (132 mcg) on Friday, take 1 tablet (88 mcg) on all other days of the week 100 tablet 3  . loratadine (CLARITIN) 10 MG  tablet Take 10 mg by mouth daily at 2 PM daily at 2 PM.     . losartan (COZAAR) 50 MG tablet Take 0.5 tablets (25 mg total) by mouth daily. (Patient taking differently: Take 25 mg by mouth daily at 6 (six) AM. ) 45 tablet 3  . meclizine (ANTIVERT) 25 MG tablet Take 25 mg by mouth 3 (three) times daily. 6am, 2pm, bedtime    . mexiletine (MEXITIL) 150 MG capsule Take 1 capsule (150 mg total) by mouth 3 (three) times daily. (Patient  taking differently: Take 150 mg by mouth 3 (three) times daily. 6am, 2pm, bedtime) 270 capsule 3  . Multiple Vitamin (MULTIVITAMIN WITH MINERALS) TABS tablet Take 1 tablet by mouth daily at 2 PM daily at 2 PM.     . Omega-3 Fatty Acids (FISH OIL) 1000 MG CAPS Take 1,000 mg by mouth 2 (two) times daily. 6am and 6pm    . ranitidine (ZANTAC) 150 MG tablet Take 150 mg by mouth 4 (four) times daily.     . rosuvastatin (CRESTOR) 5 MG tablet Take 1 tablet (5 mg total) by mouth 2 (two) times a week. Every Tuesday and Saturday (Patient taking differently: Take 5 mg by mouth 2 (two) times a week. Every Tuesday and Saturday at 6pm) 30 tablet 3  . sotalol (BETAPACE) 120 MG tablet Take 1 tablet (120 mg total) by mouth 2 (two) times daily. (Patient taking differently: Take 120 mg by mouth 2 (two) times daily. 6am, 6pm) 180 tablet 3  . warfarin (COUMADIN) 2.5 MG tablet Take as directed by Coumadin clinic (Patient taking differently: Take 2.5-3.75 mg by mouth daily at 6 PM. Take 1 tablet (2.5 mg) on Tuesday and Saturday, take 1 1/2 tablets (3.75 mg) on Sunday, Monday, Wednesday, Thursday, Friday or  as directed by Coumadin clinic) 120 tablet 1   No current facility-administered medications for this visit.    Allergies: Allergies  Allergen Reactions  . Iohexol Hives and Itching     Code: HIVES, Desc: hives w/ itching during cardiac cath '99, mult caths since w/ ? premeds, DR.G.HAYES REQUESTS 13 HR PRE MED//A.C.pt okay w/13 hr prep/mms, Onset Date: [id number]   . Prednisone Hives     Social History: The patient  reports that he quit smoking about 21 years ago. He has never used smokeless tobacco. He reports that he drinks alcohol. He reports that he does not use illicit drugs.   Family History: The patient's family history includes Heart attack in his father and mother; Heart disease in his father, mother, and sister; Hyperlipidemia in his father, mother, and sister; Hypertension in his brother, father, and mother; Stroke in his mother.   Review of Systems: Please see the history of present illness.   Otherwise, the review of systems is positive for none.   All other systems are reviewed and negative.   Physical Exam: VS:  BP 100/70 mmHg  Pulse 74  Ht 5\' 8"  (1.727 m)  Wt 165 lb 12.8 oz (75.206 kg)  BMI 25.22 kg/m2  SpO2 98% .  BMI Body mass index is 25.22 kg/(m^2).  Wt Readings from Last 3 Encounters:  01/21/15 165 lb 12.8 oz (75.206 kg)  01/20/15 164 lb 9.6 oz (74.662 kg)  01/19/15 169 lb 3.2 oz (76.749 kg)    General: Pleasant. Elderly male - looks chronically ill but in no acute distress.  HEENT: Normal. Neck: Supple, no JVD, carotid bruits, or masses noted.  Cardiac: Regular rate and rhythm. No murmurs, rubs, or gallops. No edema.  Respiratory:  Lungs are clear to auscultation bilaterally with normal work of breathing.  GI: Soft and nontender.  MS: No deformity or atrophy. Gait and ROM intact. Skin: Warm and dry. Color is normal.  Neuro:  Strength and sensation are intact and no gross focal deficits noted.  Psych: Alert, appropriate and with normal affect.   LABORATORY DATA:  EKG:  EKG is not ordered today.  Lab Results  Component Value Date   WBC 9.7 01/19/2015   HGB 14.2 01/19/2015  HCT 41.5 01/19/2015   PLT 211 01/19/2015   GLUCOSE 109* 01/19/2015   CHOL 129 12/18/2013   TRIG 111.0 12/18/2013   HDL 31.20* 12/18/2013   LDLCALC 76 12/18/2013   ALT 20 01/18/2015   AST 29 01/18/2015   NA 138 01/19/2015   K 4.1 01/19/2015   CL 100*  01/19/2015   CREATININE 1.43* 01/19/2015   BUN 19 01/19/2015   CO2 29 01/19/2015   TSH 1.252 01/18/2015   INR 2.41* 01/19/2015   HGBA1C 6.0 12/18/2013    BNP (last 3 results)  Recent Labs  06/15/14 0139 06/29/14 1038 01/18/15 0740  BNP 1001.8* 1486.2* 682.3*    ProBNP (last 3 results) No results for input(s): PROBNP in the last 8760 hours.   Other Studies Reviewed Today:  Echo Study Conclusions from 11/2013  - Left ventricle: The cavity size was mildly dilated. Wall thickness was increased in a pattern of mild LVH. Systolic function was severely reduced. The estimated ejection fraction was in the range of 20% to 25%. There is akinesis of the anteroseptal myocardium. There is aneurysmal deformity of the apical myocardium. Features are consistent with a pseudonormal left ventricular filling pattern, with concomitant abnormal relaxation and increased filling pressure (grade 2 diastolic dysfunction). - Aortic valve: There was mild regurgitation. - Left atrium: The atrium was mildly dilated. - Right atrium: The atrium was mildly dilated.  Impressions:  - Anteroseptal akinesis; apical aneurysm; overall severely reduced LV function; no apical thrombus using contrast; mild biatrial enlargement; mild AI.  Assessment/Plan: 1. Paroxysmal atrial flutter. Most recent cardioversion 11/20/14. He has noted to have had recurrent AF by his device - he has had several mode switches. Today he is in sinus. I have left him on his current regimen. He is adequately anticoagulated.  2.  Ischemic heart disease with ischemic cardiomyopathy. Ejection fraction 20-25% with apical aneurysm by echocardiogram 12/06/13. No chest pain.  3. Prior cardiac arrest secondary to acute respiratory insufficiency and pulmonary edema secondary to combined systolic and diastolic acute heart failure.  4. History of ventricular tachycardia. He has had 3 separate VT ablation procedures, last in  March 2013 by Dr. Rayann Heman  5. Hypothyroidism followed by Dr. Forde Dandy  6. HLD 7. Chronic systolic HF - recent overnight admission due to SOB - treated with IV dose. We will try to keep his weight at 165. He is to take an extra dose if his weight is higher. I have explained to them that the arrhythmia is most likely causing him to decompensate further - when he sees an elevated HR he needs to be even more vigilant about fluid intake, weight, etc.    Current medicines are reviewed with the patient today.  The patient does not have concerns regarding medicines other than what has been noted above.  The following changes have been made:  See above.  Labs/ tests ordered today include:   No orders of the defined types were placed in this encounter.     Disposition:   FU with Dr. Mare Ferrari as planned.  See Dr. Caryl Comes back as planned as well.   Patient is agreeable to this plan and will call if any problems develop in the interim.   Signed: Burtis Junes, RN, ANP-C 01/21/2015 11:10 AM  [facility] [address] [place], [place]  [address] Phone: [phone] Fax: [phone]

## 2015-01-21 NOTE — Patient Instructions (Addendum)
We will be checking the following labs today - NONE   Medication Instructions:    Continue with your current medicines.     Testing/Procedures To Be Arranged:  N/A  Follow-Up:   See Dr. Mare Ferrari as planned    Other Special Instructions:   Continue to restrict your salt  Watch your weight - if your weight is above 165 - take 40 mg of Lasix in the Am and 20 mg in the PM  Call the [place] office at [phone] if you have any questions, problems or concerns.

## 2015-02-05 ENCOUNTER — Ambulatory Visit (INDEPENDENT_AMBULATORY_CARE_PROVIDER_SITE_OTHER): Payer: Medicare Other | Admitting: Cardiology

## 2015-02-05 ENCOUNTER — Ambulatory Visit (INDEPENDENT_AMBULATORY_CARE_PROVIDER_SITE_OTHER): Payer: Medicare Other | Admitting: *Deleted

## 2015-02-05 ENCOUNTER — Encounter: Payer: Self-pay | Admitting: Cardiology

## 2015-02-05 VITALS — BP 106/62 | HR 70 | Ht 68.0 in | Wt 166.1 lb

## 2015-02-05 DIAGNOSIS — Z79899 Other long term (current) drug therapy: Secondary | ICD-10-CM

## 2015-02-05 DIAGNOSIS — E7800 Pure hypercholesterolemia, unspecified: Secondary | ICD-10-CM

## 2015-02-05 DIAGNOSIS — I259 Chronic ischemic heart disease, unspecified: Secondary | ICD-10-CM

## 2015-02-05 DIAGNOSIS — I5022 Chronic systolic (congestive) heart failure: Secondary | ICD-10-CM

## 2015-02-05 DIAGNOSIS — Z95810 Presence of automatic (implantable) cardiac defibrillator: Secondary | ICD-10-CM | POA: Diagnosis not present

## 2015-02-05 DIAGNOSIS — I255 Ischemic cardiomyopathy: Secondary | ICD-10-CM

## 2015-02-05 DIAGNOSIS — Z7901 Long term (current) use of anticoagulants: Secondary | ICD-10-CM

## 2015-02-05 LAB — POCT INR: INR: 3.1

## 2015-02-05 NOTE — Patient Instructions (Signed)
Medication Instructions:  Your physician recommends that you continue on your current medications as directed. Please refer to the Current Medication list given to you today.  Labwork: Return in the morning for fasting labs  Testing/Procedures: none  Follow-Up: Your physician recommends that you schedule a follow-up appointment in: 2 month ov/bmet/magnesium

## 2015-02-05 NOTE — Progress Notes (Signed)
Cardiology Office Note   Date:  02/05/2015   ID:  [name], DOB 1940-09-02, MRN [id number]  PCP:  Sheela Stack, MD  Cardiologist: Darlin Coco MD  No chief complaint on file.     History of Present Illness: [name] is a 74 y.o. male who presents for 2 month follow-up office visit.  He has a history of a remote anterior wall myocardial infarction. He has an ischemic cardiomyopathy. He has a left ventricular apical aneurysm. He has had a defibrillator for ventricular tachycardia. He has been followed closely by Dr. Caryl Comes and Dr. Rayann Heman. 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 Subsequently he had recurrent ventricular tachycardia and was transferred to [facility] for incessant VT and underwent a second ablation procedure complicated by pericardial perforation and pericarditis. He underwent a third catheter VT ablation procedure in March 2013 by Dr. Rayann Heman. His last echocardiogram on 12/06/13 showed an ejection fraction of 20-25% and there was an apical aneurysm. He underwent cardioversion for atrial flutter on 11/20/14. He was admitted for acute volume overload and pulmonary edema on 01/18/15 and responded to IV Lasix and was able to be discharged the following day.  Since then he has had no further episodes of acute dyspnea.  He is rigorous about keeping his weight at or below 165.   Past Medical History  Diagnosis Date  . CAD (coronary artery disease) 1999    Remote anterolater and apical MI, cath 2011 patent stents RCA/LCx  LAD w/o obstruction  . Cardiomyopathy, ischemic     EF 20-25%  echo 2012  . Chronic systolic congestive heart failure   . ICD (implantable cardiac defibrillator) in place     [place] [facility]  . 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 Abdominal aortic aneurysm 2009    Repaired with endovascular stent  . PVD (peripheral vascular disease)     Has occluded innominate vein  . Myocardial infarction   .  Hypertension   . Ventricular tachycardia     recurrent slow VT at 100-110 bpm,  Rx mexilitene/sotalol  . Left bundle branch block   . Systolic CHF, chronic   . Arthritis   . Gynecomastia     2/2 spironolactone  . Atrial fibrillation   . Pericarditis     due to perforation  . Inguinal hernia recurrent bilateral   . GERD (gastroesophageal reflux disease)   . Anemia 05/2014  . Cardiac arrest 12/05/2013  . Acute on chronic systolic heart failure 02/06/2425  . 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 SVT (supraventricular tachycardia) 12/05/2013    Past Surgical History  Procedure Laterality Date  . Icd  Feb 2012    Change out with LV lead placed with tunneling from the right  to the left side  . Endovascular stent insertion      for AAA  . Cardiac catheterization      x2  . Pacemaker insertion    . Ventricular ablation surgery      s/p prior EPS and ablation at [facility] 9/12, [facility] 11/12, and most recent [facility] 08/23/11  . Tonsillectomy    . V-tach ablation N/A 08/24/2011    Procedure: V-TACH ABLATION;  Surgeon: Thompson Grayer, MD;  Location: [facility] CATH LAB;  Service: Cardiovascular;  Laterality: N/A;  . Inguinal hernia repair Bilateral 06/20/2014    Procedure: LAPAROSCOPIC BILATERAL INGUINAL HERNIA REPAIR;  Surgeon: Michael Boston, MD;  Location: [place];  Service: General;  Laterality: Bilateral;  . Insertion of mesh N/A 06/20/2014    Procedure: INSERTION OF MESH;  Surgeon: Michael Boston, MD;  Location: [place];  Service: General;  Laterality: N/A;  . Cardioversion N/A 07/05/2014    Procedure: CARDIOVERSION;  Surgeon: Larey Dresser, MD;  Location: [place];  Service: Cardiovascular;  Laterality: N/A;  . Cardioversion N/A 11/22/2014    Procedure: CARDIOVERSION;  Surgeon: Thayer Headings, MD;  Location: [facility] ENDOSCOPY;  Service: Cardiovascular;  Laterality: N/A;     Current Outpatient Prescriptions  Medication Sig Dispense Refill  . acetaminophen  (TYLENOL) 500 MG tablet Take 250 mg by mouth 2 (two) times daily.     [name] ALPRAZolam (XANAX) 0.25 MG tablet Take 0.25 mg by mouth every 4 (four) hours.    . Ascorbic Acid (VITAMIN C) 500 MG tablet Take 500 mg by mouth daily at 2 PM daily at 2 PM.     . carvedilol (COREG) 12.5 MG tablet Take 1 tablet (12.5 mg total) by mouth 2 (two) times daily. 180 tablet 3  . docusate sodium (COLACE) 100 MG capsule Take 100 mg by mouth daily at 2 PM daily at 2 PM.    . furosemide (LASIX) 20 MG tablet Take 20 mg by mouth 2 (two) times daily.    [name] levothyroxine (SYNTHROID, LEVOTHROID) 88 MCG tablet Take 1 1/2 tablet (132 mcg) on Friday, take 1 tablet (88 mcg) on all other days of the week 100 tablet 3  . loratadine (CLARITIN) 10 MG tablet Take 10 mg by mouth daily at 2 PM daily at 2 PM.     . losartan (COZAAR) 50 MG tablet Take 25 mg by mouth daily.    . meclizine (ANTIVERT) 25 MG tablet Take 25 mg by mouth 3 (three) times daily. 6am, 2pm, bedtime    . meclizine (ANTIVERT) 25 MG tablet TAKE 1 TABLET (25 MG TOTAL) BY MOUTH 3 (THREE) TIMES DAILY AS NEEDED. 270 tablet 0  . mexiletine (MEXITIL) 150 MG capsule Take 150 mg by mouth 3 (three) times daily.    . Multiple Vitamin (MULTIVITAMIN WITH MINERALS) TABS tablet Take 1 tablet by mouth daily at 2 PM daily at 2 PM.     . Omega-3 Fatty Acids (FISH OIL) 1000 MG CAPS Take 1,000 mg by mouth 2 (two) times daily. 6am and 6pm    . ranitidine (ZANTAC) 150 MG tablet Take 150 mg by mouth 4 (four) times daily.     . rosuvastatin (CRESTOR) 5 MG tablet Take 5 mg by mouth. Take 1 tablet by mouth two times a week (Tuesdays Saturdays)    . warfarin (COUMADIN) 2.5 MG tablet TAKE AS DIRECTED BY COUMADIN CLINIC 120 tablet 1   No current facility-administered medications for this visit.    Allergies:   Iohexol and Prednisone    Social History:  The patient  reports that he quit smoking about 21 years ago. He has never used smokeless tobacco. He reports that he drinks alcohol. He  reports that he does not use illicit drugs.   Family History:  The patient's family history includes Heart attack in his father and mother; Heart disease in his father, mother, and sister; Hyperlipidemia in his father, mother, and sister; Hypertension in his brother, father, and mother; Stroke in his mother.    ROS:  Please see the history of present illness.   Otherwise, review of systems are positive for none.   All  other systems are reviewed and negative.    PHYSICAL EXAM: VS:  BP 106/62 mmHg  Pulse 70  Ht 5\' 8"  (1.727 m)  Wt 166 lb 1.9 oz (75.352 kg)  BMI 25.26 kg/m2 , BMI Body mass index is 25.26 kg/(m^2). GEN: Well nourished, well developed, in no acute distress HEENT: normal Neck: no JVD, carotid bruits, or masses Cardiac: RRR; no murmurs, rubs, or gallops,no edema  Respiratory:  clear to auscultation bilaterally, normal work of breathing GI: soft, nontender, nondistended, + BS MS: no deformity or atrophy Skin: warm and dry, no rash Neuro:  Strength and sensation are intact Psych: euthymic mood, full affect   EKG:  EKG is ordered today. The ekg ordered today demonstrates atrioventricular pacemaker at 70/min.   Recent Labs: 05/06/2014: Magnesium 2.1 01/18/2015: ALT 20; B Natriuretic Peptide 682.3*; TSH 1.252 01/19/2015: BUN 19; Creatinine, Ser 1.43*; Hemoglobin 14.2; Platelets 211; Potassium 4.1; Sodium 138    Lipid Panel    Component Value Date/Time   CHOL 129 12/18/2013 1032   TRIG 111.0 12/18/2013 1032   HDL 31.20* 12/18/2013 1032   CHOLHDL 4 12/18/2013 1032   VLDL 22.2 12/18/2013 1032   LDLCALC 76 12/18/2013 1032      Wt Readings from Last 3 Encounters:  02/05/15 166 lb 1.9 oz (75.352 kg)  01/21/15 165 lb 12.8 oz (75.206 kg)  01/20/15 164 lb 9.6 oz (74.662 kg)        ASSESSMENT AND PLAN:  1. Paroxysmal atrial flutter. Most recent cardioversion 11/20/14. He has noted to have had recurrent AF by his device - he has had several mode switches. Today he  is in sinus. I have left him on his current regimen. He is adequately anticoagulated.  2. Ischemic heart disease with ischemic cardiomyopathy. Ejection fraction 20-25% with apical aneurysm by echocardiogram 12/06/13. No chest pain.  3. Prior cardiac arrest secondary to acute respiratory insufficiency and pulmonary edema secondary to combined systolic and diastolic acute heart failure.  4. History of ventricular tachycardia. He has had 3 separate VT ablation procedures, last in March 2013 by Dr. Rayann Heman  5. Hypothyroidism followed by Dr. Forde Dandy  6. HLD 7. Chronic systolic HF - recent overnight admission due to SOB - treated with IV dose. We will try to keep his weight at 165. He is to take an extra dose if his weight is higher. I have explained to them that the arrhythmia is most likely causing him to decompensate further - when he sees an elevated HR he needs to be even more vigilant about fluid intake, weight, etc.    Current medicines are reviewed at length with the patient today.  The patient does not have concerns regarding medicines.  The following changes have been made:  no change  Labs/ tests ordered today include:   Orders Placed This Encounter  Procedures  . Lipid panel  . Hepatic function panel  . Basic metabolic panel  . Magnesium  . Basic metabolic panel  . EKG 12-Lead    Disposition: He will return tomorrow morning for fasting lipid panel.  He has not had one in more than a year.  He is on Crestor twice a week.  Higher doses of statins have caused problems with myalgias.  He will return in 2 months for office visit basal metabolic panel and magnesium level.  Continue current medication   Signed, Darlin Coco MD 02/05/2015 5:03 PM    [facility] [address], [place], [place]  [address] Phone: [phone]; Fax: [phone]

## 2015-02-06 ENCOUNTER — Other Ambulatory Visit (INDEPENDENT_AMBULATORY_CARE_PROVIDER_SITE_OTHER): Payer: Medicare Other

## 2015-02-06 DIAGNOSIS — Z79899 Other long term (current) drug therapy: Secondary | ICD-10-CM

## 2015-02-06 DIAGNOSIS — E7800 Pure hypercholesterolemia, unspecified: Secondary | ICD-10-CM

## 2015-02-06 LAB — LIPID PANEL
CHOLESTEROL: 142 mg/dL (ref 0–200)
HDL: 37.5 mg/dL — ABNORMAL LOW (ref >39.00)
LDL Cholesterol: 77 mg/dL (ref 0–99)
NonHDL: 104.26
Total CHOL/HDL Ratio: 4
Triglycerides: 134 mg/dL (ref 0.0–149.0)
VLDL: 26.8 mg/dL (ref 0.0–40.0)

## 2015-02-06 LAB — BASIC METABOLIC PANEL
BUN: 15 mg/dL (ref 6–23)
CALCIUM: 9.4 mg/dL (ref 8.4–10.5)
CO2: 33 mEq/L — ABNORMAL HIGH (ref 19–32)
Chloride: 100 mEq/L (ref 96–112)
Creatinine, Ser: 1.4 mg/dL (ref 0.40–1.50)
GFR: 52.75 mL/min — AB (ref >60.00)
GLUCOSE: 102 mg/dL — AB (ref 70–99)
Potassium: 4.2 mEq/L (ref 3.5–5.1)
Sodium: 137 mEq/L (ref 135–145)

## 2015-02-06 LAB — HEPATIC FUNCTION PANEL
ALT: 17 U/L (ref 0–53)
AST: 16 U/L (ref 0–37)
Albumin: 4.2 g/dL (ref 3.5–5.2)
Alkaline Phosphatase: 42 U/L (ref 39–117)
BILIRUBIN DIRECT: 0.1 mg/dL (ref 0.0–0.3)
BILIRUBIN TOTAL: 0.7 mg/dL (ref 0.2–1.2)
Total Protein: 7.2 g/dL (ref 6.0–8.3)

## 2015-02-06 LAB — MAGNESIUM: Magnesium: 2.2 mg/dL (ref 1.5–2.5)

## 2015-02-06 NOTE — Progress Notes (Signed)
Quick Note:  Please report to patient. The recent labs are stable. Continue same medication and careful diet. Lipids are satisfactory on current meds. ______

## 2015-02-11 ENCOUNTER — Ambulatory Visit: Payer: Medicare Other | Admitting: *Deleted

## 2015-02-11 NOTE — Progress Notes (Signed)
Remote ICD transmission.   

## 2015-02-12 ENCOUNTER — Ambulatory Visit (INDEPENDENT_AMBULATORY_CARE_PROVIDER_SITE_OTHER): Payer: Medicare Other | Admitting: Internal Medicine

## 2015-02-12 ENCOUNTER — Encounter: Payer: Self-pay | Admitting: Internal Medicine

## 2015-02-12 VITALS — BP 122/74 | HR 69 | Ht 68.0 in | Wt 166.8 lb

## 2015-02-12 DIAGNOSIS — I5022 Chronic systolic (congestive) heart failure: Secondary | ICD-10-CM | POA: Diagnosis not present

## 2015-02-12 DIAGNOSIS — I255 Ischemic cardiomyopathy: Secondary | ICD-10-CM

## 2015-02-12 DIAGNOSIS — Z95810 Presence of automatic (implantable) cardiac defibrillator: Secondary | ICD-10-CM

## 2015-02-12 DIAGNOSIS — I4891 Unspecified atrial fibrillation: Secondary | ICD-10-CM

## 2015-02-12 DIAGNOSIS — I259 Chronic ischemic heart disease, unspecified: Secondary | ICD-10-CM

## 2015-02-12 DIAGNOSIS — I4892 Unspecified atrial flutter: Secondary | ICD-10-CM | POA: Diagnosis not present

## 2015-02-12 NOTE — Patient Instructions (Addendum)
Medication Instructions:  Your physician recommends that you continue on your current medications as directed. Please refer to the Current Medication list given to you today.  Labwork: NONE  Testing/Procedures: Your physician has recommended that you have a Cardioversion (DCCV) on 02/18/2015. Electrical Cardioversion uses a jolt of electricity to your heart either through paddles or wired patches attached to your chest. This is a controlled, usually prescheduled, procedure. Defibrillation is done under light anesthesia in the hospital, and you usually go home the day of the procedure. This is done to get your heart back into a normal rhythm. You are not awake for the procedure. Please see the instruction sheet given to you today.   Any Other Special Instructions Will Be Listed Below (If Applicable).

## 2015-02-12 NOTE — Progress Notes (Signed)
[email or web address]      Patient Care Team: Reynold Bowen, MD as PCP - General (Endocrinology) Darlin Coco, MD as Consulting Physician (Cardiology) Deboraha Sprang, MD as Consulting Physician (Cardiology) Serafina Mitchell, MD as Consulting Physician (Vascular Surgery) Michael Boston, MD as Consulting Physician (General Surgery)   HPI  [name] is a 74 y.o. male Is seen in followup for recurrent ventricular tachycardia. He has been on multiple medications in the past, and underwent catheter ablation in the setting of VT storm September 2012. He had post ablation ventricular tachycardia and was started on amiodarone which had previously not been used because of concurrent hypothyroidism.   Because of recurrent ventricular tachycardia he was transferred to [facility] for incessant VT and underwent an ablation complicated by pericardial perforation and subsequent pericarditis.   He was seen a couple of weeks after the last visit in the emergency room with frequent ventricular ectopy almost in a pattern of accelerated idioventricular rhythm and we put him on mexiletine. This has been associated with a decrease in his ventricular burden from 38% to 6% and a marked improvement in his overall symptom complex.  His energy is improving.  He then had recurrent ventricular tachycardia , which was relatively slow at 110-130 beats per minute and underwent repeat catheter ablation in March 2103 by Dr. Rayann Heman   Ischemic cardiomyopathy with ejection fraction of 25-30% by echo in December 2000 well with multiple wall motion abnormalities ejection fraction 30%   He lacks energy  He felt much better folowwoig the cardioversion in June 2016   There has been gradual worseing of symptoms   Past Medical History  Diagnosis Date  . CAD (coronary artery disease) 1999    Remote anterolater and apical MI, cath 2011 patent stents RCA/LCx  LAD w/o obstruction  . Cardiomyopathy, ischemic     EF 20-25%  echo 2012  .  Chronic systolic congestive heart failure   . ICD (implantable cardiac defibrillator) in place     [place] Jude  . 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 Abdominal aortic aneurysm 2009    Repaired with endovascular stent  . PVD (peripheral vascular disease)     Has occluded innominate vein  . Myocardial infarction   . Hypertension   . Ventricular tachycardia     recurrent slow VT at 100-110 bpm,  Rx mexilitene/sotalol  . Left bundle branch block   . Systolic CHF, chronic   . Arthritis   . Gynecomastia     2/2 spironolactone  . Atrial fibrillation   . Pericarditis     due to perforation  . Inguinal hernia recurrent bilateral   . GERD (gastroesophageal reflux disease)   . Anemia 05/2014  . Cardiac arrest 12/05/2013  . Acute on chronic systolic heart failure 10/05/996  . 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 SVT (supraventricular tachycardia) 12/05/2013    Past Surgical History  Procedure Laterality Date  . Icd  Feb 2012    Change out with LV lead placed with tunneling from the right  to the left side  . Endovascular stent insertion      for AAA  . Cardiac catheterization      x2  . Pacemaker insertion    . Ventricular ablation surgery      s/p prior EPS and ablation at [facility] 9/12, [facility] 11/12, and most recent at [facility] 08/23/11  . Tonsillectomy    .  V-tach ablation N/A 08/24/2011    Procedure: V-TACH ABLATION;  Surgeon: Thompson Grayer, MD;  Location: [facility] CATH LAB;  Service: Cardiovascular;  Laterality: N/A;  . Inguinal hernia repair Bilateral 06/20/2014    Procedure: LAPAROSCOPIC BILATERAL INGUINAL HERNIA REPAIR;  Surgeon: Michael Boston, MD;  Location: [place];  Service: General;  Laterality: Bilateral;  . Insertion of mesh N/A 06/20/2014    Procedure: INSERTION OF MESH;  Surgeon: Michael Boston, MD;  Location: [place];  Service: General;  Laterality: N/A;  . Cardioversion N/A 07/05/2014    Procedure: CARDIOVERSION;  Surgeon: Larey Dresser, MD;   Location: [place];  Service: Cardiovascular;  Laterality: N/A;  . Cardioversion N/A 11/22/2014    Procedure: CARDIOVERSION;  Surgeon: Thayer Headings, MD;  Location: [facility] ENDOSCOPY;  Service: Cardiovascular;  Laterality: N/A;    Current Outpatient Prescriptions  Medication Sig Dispense Refill  . acetaminophen (TYLENOL) 500 MG tablet Take 250 mg by mouth 2 (two) times daily.     [name] ALPRAZolam (XANAX) 0.25 MG tablet Take 0.25 mg by mouth every 4 (four) hours.    . Ascorbic Acid (VITAMIN C) 500 MG tablet Take 500 mg by mouth daily at 2 PM daily at 2 PM.     . carvedilol (COREG) 12.5 MG tablet Take 1 tablet (12.5 mg total) by mouth 2 (two) times daily. 180 tablet 3  . docusate sodium (COLACE) 100 MG capsule Take 100 mg by mouth daily at 2 PM daily at 2 PM.    . furosemide (LASIX) 20 MG tablet Take 20 mg by mouth 2 (two) times daily.    [name] levothyroxine (SYNTHROID, LEVOTHROID) 88 MCG tablet Take 1 1/2 tablet (132 mcg) on Friday, take 1 tablet (88 mcg) on all other days of the week 100 tablet 3  . loratadine (CLARITIN) 10 MG tablet Take 10 mg by mouth daily at 2 PM daily at 2 PM.     . losartan (COZAAR) 50 MG tablet Take 25 mg by mouth daily.    . meclizine (ANTIVERT) 25 MG tablet TAKE 1 TABLET (25 MG TOTAL) BY MOUTH 3 (THREE) TIMES DAILY AS NEEDED. 270 tablet 0  . mexiletine (MEXITIL) 150 MG capsule Take 150 mg by mouth 3 (three) times daily.    . Multiple Vitamin (MULTIVITAMIN WITH MINERALS) TABS tablet Take 1 tablet by mouth daily at 2 PM daily at 2 PM.     . Omega-3 Fatty Acids (FISH OIL) 1000 MG CAPS Take 1,000 mg by mouth 2 (two) times daily. 6am and 6pm    . ranitidine (ZANTAC) 150 MG tablet Take 150 mg by mouth 4 (four) times daily.     . rosuvastatin (CRESTOR) 5 MG tablet Take 5 mg by mouth. Take 1 tablet by mouth two times a week (Tuesdays Saturdays)    . sotalol (BETAPACE) 120 MG tablet Take 120 mg by mouth every 12 (twelve) hours.     [name] warfarin (COUMADIN) 2.5 MG tablet TAKE AS DIRECTED  BY COUMADIN CLINIC 120 tablet 1   No current facility-administered medications for this visit.    Allergies  Allergen Reactions  . Iohexol Hives and Itching     Code: HIVES, Desc: hives w/ itching during cardiac cath '99, mult caths since w/ ? premeds, DR.G.HAYES REQUESTS 13 HR PRE MED//A.C.pt okay w/13 hr prep/mms, Onset Date: [id number]   . Prednisone Hives    Review of Systems negative except from HPI and PMH  Physical Exam BP 122/74 mmHg  Pulse 69  Ht 5\' 8"  (  1.727 m)  Wt 166 lb 12.8 oz (75.66 kg)  BMI 25.37 kg/m2 Well developed and well nourished in no acute distress HENT normal E scleral and icterus clear Neck Supple JVP 7 with HJR  carotids brisk and full Clear to ausculation Irregular rate and rhythm, no murmurs gallops or rub Soft with active bowel sounds No clubbing cyanosis none Edema Alert and oriented, grossly normal motor and sensory function Skin Warm and Dry  ECG demonstrates AV pacing at a rate consistent left ventricular activation  Assessment and  Plan  Vetnricular tacycardia  SVT--ATrial fluttter and fib  ischmeic cardiomyopathy  HFrEF  Euvolemic  Symptoms may be worse with afib/flutter and long discussions regarding the role of repeat ccardioversion and possible augmented drug therapy  For now DCCV and if early recurrence will plan admission with dofetilide  Have instructed him in taking pulse atnicpating that sinus will be slow and regular and that fib flutter will be irregular and faster

## 2015-02-13 LAB — CUP PACEART INCLINIC DEVICE CHECK
Lead Channel Pacing Threshold Amplitude: 0.875 V
Lead Channel Pacing Threshold Amplitude: 1.375 V
Lead Channel Pacing Threshold Pulse Width: 0.5 ms
Lead Channel Pacing Threshold Pulse Width: 0.5 ms
Lead Channel Sensing Intrinsic Amplitude: 11.8 mV
Lead Channel Setting Pacing Amplitude: 2.5 V
Lead Channel Setting Pacing Amplitude: 2.5 V
Lead Channel Setting Pacing Pulse Width: 0.5 ms
Lead Channel Setting Pacing Pulse Width: 0.5 ms
MDC IDC MSMT BATTERY REMAINING LONGEVITY: 14.4 mo
MDC IDC MSMT LEADCHNL RA PACING THRESHOLD AMPLITUDE: 2.875 V
MDC IDC MSMT LEADCHNL RA PACING THRESHOLD PULSEWIDTH: 0.5 ms
MDC IDC MSMT LEADCHNL RA SENSING INTR AMPL: 0.4 mV
MDC IDC PG SERIAL: 828324
MDC IDC SESS DTM: 20160914191320
MDC IDC SET LEADCHNL RV PACING AMPLITUDE: 2.5 V
MDC IDC SET LEADCHNL RV SENSING SENSITIVITY: 0.5 mV
MDC IDC SET ZONE DETECTION INTERVAL: 570 ms
MDC IDC STAT BRADY RA PERCENT PACED: 0 %
MDC IDC STAT BRADY RV PERCENT PACED: 95 %
Zone Setting Detection Interval: 310 ms

## 2015-02-17 ENCOUNTER — Telehealth: Payer: Self-pay

## 2015-02-17 MED ORDER — SODIUM CHLORIDE 0.9 % IV SOLN
INTRAVENOUS | Status: DC
  Administered 2015-02-18: 08:00:00 via INTRAVENOUS

## 2015-02-17 NOTE — Telephone Encounter (Signed)
-----   Message from Emmaline Life, RN sent at 02/17/2015 12:13 PM EDT ----- 2:30 is all they've got unless patient wants to come at 8:00 am ----- Message -----    From: Dalphine Handing, RN    Sent: 02/13/2015  10:48 AM      To: Emmaline Life, RN  Cardioversion on 9/20

## 2015-02-17 NOTE — Telephone Encounter (Signed)
Cardioversion for 9/20 time has changed from 2:30 pm to 8:00 am. Patient is aware of time change and will arrive at hospital at 6:30 am.

## 2015-02-18 ENCOUNTER — Encounter (HOSPITAL_COMMUNITY): Payer: Self-pay | Admitting: Anesthesiology

## 2015-02-18 ENCOUNTER — Ambulatory Visit (HOSPITAL_COMMUNITY)
Admission: RE | Admit: 2015-02-18 | Discharge: 2015-02-18 | Disposition: A | Discharge status: Home / Self Care | Payer: Medicare Other | Source: Ambulatory Visit | Attending: Cardiovascular Disease | Admitting: Cardiovascular Disease

## 2015-02-18 ENCOUNTER — Ambulatory Visit (HOSPITAL_COMMUNITY): Payer: Medicare Other | Admitting: Anesthesiology

## 2015-02-18 ENCOUNTER — Encounter (HOSPITAL_COMMUNITY)
Admission: RE | Disposition: A | Discharge status: Home / Self Care | Payer: Self-pay | Source: Ambulatory Visit | Attending: Cardiovascular Disease

## 2015-02-18 DIAGNOSIS — Z79899 Other long term (current) drug therapy: Secondary | ICD-10-CM | POA: Insufficient documentation

## 2015-02-18 DIAGNOSIS — Z87891 Personal history of nicotine dependence: Secondary | ICD-10-CM | POA: Diagnosis not present

## 2015-02-18 DIAGNOSIS — I10 Essential (primary) hypertension: Secondary | ICD-10-CM | POA: Diagnosis not present

## 2015-02-18 DIAGNOSIS — I255 Ischemic cardiomyopathy: Secondary | ICD-10-CM | POA: Diagnosis not present

## 2015-02-18 DIAGNOSIS — Z7901 Long term (current) use of anticoagulants: Secondary | ICD-10-CM | POA: Diagnosis not present

## 2015-02-18 DIAGNOSIS — I252 Old myocardial infarction: Secondary | ICD-10-CM | POA: Insufficient documentation

## 2015-02-18 DIAGNOSIS — Z8674 Personal history of sudden cardiac arrest: Secondary | ICD-10-CM | POA: Diagnosis not present

## 2015-02-18 DIAGNOSIS — I2510 Atherosclerotic heart disease of native coronary artery without angina pectoris: Secondary | ICD-10-CM | POA: Insufficient documentation

## 2015-02-18 DIAGNOSIS — I5022 Chronic systolic (congestive) heart failure: Secondary | ICD-10-CM | POA: Diagnosis not present

## 2015-02-18 DIAGNOSIS — I4892 Unspecified atrial flutter: Secondary | ICD-10-CM | POA: Insufficient documentation

## 2015-02-18 DIAGNOSIS — Z95810 Presence of automatic (implantable) cardiac defibrillator: Secondary | ICD-10-CM | POA: Diagnosis not present

## 2015-02-18 DIAGNOSIS — I4891 Unspecified atrial fibrillation: Secondary | ICD-10-CM | POA: Diagnosis not present

## 2015-02-18 DIAGNOSIS — K219 Gastro-esophageal reflux disease without esophagitis: Secondary | ICD-10-CM | POA: Diagnosis not present

## 2015-02-18 DIAGNOSIS — I739 Peripheral vascular disease, unspecified: Secondary | ICD-10-CM | POA: Diagnosis not present

## 2015-02-18 HISTORY — PX: CARDIOVERSION: SHX1299

## 2015-02-18 LAB — PROTIME-INR
INR: 3.15 — ABNORMAL HIGH (ref 0.00–1.49)
Prothrombin Time: 31.7 seconds — ABNORMAL HIGH (ref 11.6–15.2)

## 2015-02-18 LAB — POCT I-STAT 4, (NA,K, GLUC, HGB,HCT)
GLUCOSE: 116 mg/dL — AB (ref 65–99)
HCT: 41 % (ref 39.0–52.0)
Hemoglobin: 13.9 g/dL (ref 13.0–17.0)
Potassium: 4.6 mmol/L (ref 3.5–5.1)
SODIUM: 138 mmol/L (ref 135–145)

## 2015-02-18 SURGERY — CARDIOVERSION
Anesthesia: General

## 2015-02-18 MED ORDER — PROPOFOL 10 MG/ML IV BOLUS
INTRAVENOUS | Status: DC | PRN
  Administered 2015-02-18: 40 mg via INTRAVENOUS

## 2015-02-18 MED ORDER — PHENYLEPHRINE HCL 10 MG/ML IJ SOLN
INTRAMUSCULAR | Status: DC | PRN
  Administered 2015-02-18: 80 ug via INTRAVENOUS

## 2015-02-18 MED ORDER — LIDOCAINE HCL (CARDIAC) 20 MG/ML IV SOLN
INTRAVENOUS | Status: DC | PRN
  Administered 2015-02-18: 50 mg via INTRAVENOUS

## 2015-02-18 NOTE — CV Procedure (Signed)
    Cardioversion Note  [name] [id number] 03-05-1941  Procedure: DC Cardioversion Indications: atrial flutter   Procedure Details Consent: Obtained Time Out: Verified patient identification, verified procedure, site/side was marked, verified correct patient position, special equipment/implants available, Radiology Safety Procedures followed,  medications/allergies/relevent history reviewed, required imaging and test results available.  Performed  The patient has been on adequate anticoagulation.  The patient received IV Lidocaine 60 mg  followed by Propofol 40 mg IV  for sedation.  Synchronous cardioversion was performed at 50 joules.  The cardioversion was successful.  Verified by ICD interrogation - assisted by [facility]. Jude rep, Darlina Guys     Complications: No apparent complications Patient did tolerate procedure well.   Thayer Headings, Brooke Bonito., MD, [facility] 02/18/2015, 8:28 AM

## 2015-02-18 NOTE — H&P (View-Only) (Signed)
[email or web address]      Patient Care Team: Reynold Bowen, MD as PCP - General (Endocrinology) Darlin Coco, MD as Consulting Physician (Cardiology) Deboraha Sprang, MD as Consulting Physician (Cardiology) Serafina Mitchell, MD as Consulting Physician (Vascular Surgery) Michael Boston, MD as Consulting Physician (General Surgery)   HPI  [name] is a 74 y.o. male Is seen in followup for recurrent ventricular tachycardia. He has been on multiple medications in the past, and underwent catheter ablation in the setting of VT storm September 2012. He had post ablation ventricular tachycardia and was started on amiodarone which had previously not been used because of concurrent hypothyroidism.   Because of recurrent ventricular tachycardia he was transferred to [facility] for incessant VT and underwent an ablation complicated by pericardial perforation and subsequent pericarditis.   He was seen a couple of weeks after the last visit in the emergency room with frequent ventricular ectopy almost in a pattern of accelerated idioventricular rhythm and we put him on mexiletine. This has been associated with a decrease in his ventricular burden from 38% to 6% and a marked improvement in his overall symptom complex.  His energy is improving.  He then had recurrent ventricular tachycardia , which was relatively slow at 110-130 beats per minute and underwent repeat catheter ablation in March 2103 by Dr. Rayann Heman   Ischemic cardiomyopathy with ejection fraction of 25-30% by echo in December 2000 well with multiple wall motion abnormalities ejection fraction 30%   He lacks energy  He felt much better folowwoig the cardioversion in June 2016   There has been gradual worseing of symptoms   Past Medical History  Diagnosis Date  . CAD (coronary artery disease) 1999    Remote anterolater and apical MI, cath 2011 patent stents RCA/LCx  LAD w/o obstruction  . Cardiomyopathy, ischemic     EF 20-25%  echo 2012  .  Chronic systolic congestive heart failure   . ICD (implantable cardiac defibrillator) in place     [employer] Jude  . 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 Abdominal aortic aneurysm 2009    Repaired with endovascular stent  . PVD (peripheral vascular disease)     Has occluded innominate vein  . Myocardial infarction   . Hypertension   . Ventricular tachycardia     recurrent slow VT at 100-110 bpm,  Rx mexilitene/sotalol  . Left bundle branch block   . Systolic CHF, chronic   . Arthritis   . Gynecomastia     2/2 spironolactone  . Atrial fibrillation   . Pericarditis     due to perforation  . Inguinal hernia recurrent bilateral   . GERD (gastroesophageal reflux disease)   . Anemia 05/2014  . Cardiac arrest 12/05/2013  . Acute on chronic systolic heart failure 4/0/3474  . 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 SVT (supraventricular tachycardia) 12/05/2013    Past Surgical History  Procedure Laterality Date  . Icd  Feb 2012    Change out with LV lead placed with tunneling from the right  to the left side  . Endovascular stent insertion      for AAA  . Cardiac catheterization      x2  . Pacemaker insertion    . Ventricular ablation surgery      s/p prior EPS and ablation at [facility] 9/12, [facility] 11/12, and most recent at [facility] 08/23/11  . Tonsillectomy    .  V-tach ablation N/A 08/24/2011    Procedure: V-TACH ABLATION;  Surgeon: Thompson Grayer, MD;  Location: [facility] CATH LAB;  Service: Cardiovascular;  Laterality: N/A;  . Inguinal hernia repair Bilateral 06/20/2014    Procedure: LAPAROSCOPIC BILATERAL INGUINAL HERNIA REPAIR;  Surgeon: Michael Boston, MD;  Location: [place];  Service: General;  Laterality: Bilateral;  . Insertion of mesh N/A 06/20/2014    Procedure: INSERTION OF MESH;  Surgeon: Michael Boston, MD;  Location: [place];  Service: General;  Laterality: N/A;  . Cardioversion N/A 07/05/2014    Procedure: CARDIOVERSION;  Surgeon: Larey Dresser, MD;   Location: [place];  Service: Cardiovascular;  Laterality: N/A;  . Cardioversion N/A 11/22/2014    Procedure: CARDIOVERSION;  Surgeon: Thayer Headings, MD;  Location: [facility] ENDOSCOPY;  Service: Cardiovascular;  Laterality: N/A;    Current Outpatient Prescriptions  Medication Sig Dispense Refill  . acetaminophen (TYLENOL) 500 MG tablet Take 250 mg by mouth 2 (two) times daily.     [name] ALPRAZolam (XANAX) 0.25 MG tablet Take 0.25 mg by mouth every 4 (four) hours.    . Ascorbic Acid (VITAMIN C) 500 MG tablet Take 500 mg by mouth daily at 2 PM daily at 2 PM.     . carvedilol (COREG) 12.5 MG tablet Take 1 tablet (12.5 mg total) by mouth 2 (two) times daily. 180 tablet 3  . docusate sodium (COLACE) 100 MG capsule Take 100 mg by mouth daily at 2 PM daily at 2 PM.    . furosemide (LASIX) 20 MG tablet Take 20 mg by mouth 2 (two) times daily.    [name] levothyroxine (SYNTHROID, LEVOTHROID) 88 MCG tablet Take 1 1/2 tablet (132 mcg) on Friday, take 1 tablet (88 mcg) on all other days of the week 100 tablet 3  . loratadine (CLARITIN) 10 MG tablet Take 10 mg by mouth daily at 2 PM daily at 2 PM.     . losartan (COZAAR) 50 MG tablet Take 25 mg by mouth daily.    . meclizine (ANTIVERT) 25 MG tablet TAKE 1 TABLET (25 MG TOTAL) BY MOUTH 3 (THREE) TIMES DAILY AS NEEDED. 270 tablet 0  . mexiletine (MEXITIL) 150 MG capsule Take 150 mg by mouth 3 (three) times daily.    . Multiple Vitamin (MULTIVITAMIN WITH MINERALS) TABS tablet Take 1 tablet by mouth daily at 2 PM daily at 2 PM.     . Omega-3 Fatty Acids (FISH OIL) 1000 MG CAPS Take 1,000 mg by mouth 2 (two) times daily. 6am and 6pm    . ranitidine (ZANTAC) 150 MG tablet Take 150 mg by mouth 4 (four) times daily.     . rosuvastatin (CRESTOR) 5 MG tablet Take 5 mg by mouth. Take 1 tablet by mouth two times a week (Tuesdays Saturdays)    . sotalol (BETAPACE) 120 MG tablet Take 120 mg by mouth every 12 (twelve) hours.     [name] warfarin (COUMADIN) 2.5 MG tablet TAKE AS DIRECTED  BY COUMADIN CLINIC 120 tablet 1   No current facility-administered medications for this visit.    Allergies  Allergen Reactions  . Iohexol Hives and Itching     Code: HIVES, Desc: hives w/ itching during cardiac cath '99, mult caths since w/ ? premeds, DR.G.HAYES REQUESTS 13 HR PRE MED//A.C.pt okay w/13 hr prep/mms, Onset Date: [id number]   . Prednisone Hives    Review of Systems negative except from HPI and PMH  Physical Exam BP 122/74 mmHg  Pulse 69  Ht 5\' 8"  (  1.727 m)  Wt 166 lb 12.8 oz (75.66 kg)  BMI 25.37 kg/m2 Well developed and well nourished in no acute distress HENT normal E scleral and icterus clear Neck Supple JVP 7 with HJR  carotids brisk and full Clear to ausculation Irregular rate and rhythm, no murmurs gallops or rub Soft with active bowel sounds No clubbing cyanosis none Edema Alert and oriented, grossly normal motor and sensory function Skin Warm and Dry  ECG demonstrates AV pacing at a rate consistent left ventricular activation  Assessment and  Plan  Vetnricular tacycardia  SVT--ATrial fluttter and fib  ischmeic cardiomyopathy  HFrEF  Euvolemic  Symptoms may be worse with afib/flutter and long discussions regarding the role of repeat ccardioversion and possible augmented drug therapy  For now DCCV and if early recurrence will plan admission with dofetilide  Have instructed him in taking pulse atnicpating that sinus will be slow and regular and that fib flutter will be irregular and faster

## 2015-02-18 NOTE — Transfer of Care (Signed)
Immediate Anesthesia Transfer of Care Note  Patient: [name]  Procedure(s) Performed: Procedure(s): CARDIOVERSION (N/A)  Patient Location: Endoscopy Unit  Anesthesia Type:MAC  Level of Consciousness: awake, alert , oriented and patient cooperative  Airway & Oxygen Therapy: Patient Spontanous Breathing and Patient connected to nasal cannula oxygen  Post-op Assessment: Report given to RN, Post -op Vital signs reviewed and stable and Patient moving all extremities  Post vital signs: Reviewed and stable  Last Vitals:  Filed Vitals:   02/18/15 0811  Pulse: 66  Temp: 36.7 C  Resp: 12    Complications: No apparent anesthesia complications

## 2015-02-18 NOTE — Anesthesia Preprocedure Evaluation (Addendum)
Anesthesia Evaluation  Patient identified by MRN, date of birth, ID band Patient awake    Reviewed: Allergy & Precautions, NPO status , Patient's Chart, lab work & pertinent test results  Airway Mallampati: II  TM Distance: >3 FB Neck ROM: Full    Dental  (+) Teeth Intact, Caps, Dental Advisory Given   Pulmonary former smoker,    Pulmonary exam normal        Cardiovascular hypertension, + CAD, + Past MI, + Peripheral Vascular Disease and +CHF  + dysrhythmias Atrial Fibrillation  Rhythm:Irregular Rate:Tachycardia  EF20-25%   Neuro/Psych negative neurological ROS  negative psych ROS   GI/Hepatic Neg liver ROS, GERD  ,  Endo/Other  Hyperthyroidism   Renal/GU Renal InsufficiencyRenal disease     Musculoskeletal   Abdominal   Peds  Hematology   Anesthesia Other Findings   Reproductive/Obstetrics                            Anesthesia Physical  Anesthesia Plan  ASA: III  Anesthesia Plan: General   Post-op Pain Management:    Induction: Intravenous  Airway Management Planned: Mask  Additional Equipment:   Intra-op Plan:   Post-operative Plan:   Informed Consent: I have reviewed the patients History and Physical, chart, labs and discussed the procedure including the risks, benefits and alternatives for the proposed anesthesia with the patient or authorized representative who has indicated his/her understanding and acceptance.   Dental advisory given  Plan Discussed with: CRNA and Anesthesiologist  Anesthesia Plan Comments:         Anesthesia Quick Evaluation

## 2015-02-18 NOTE — Discharge Instructions (Signed)

## 2015-02-18 NOTE — Interval H&P Note (Signed)
History and Physical Interval Note:  02/18/2015 7:29 AM  [name]  has presented today for surgery, with the diagnosis of AFIB, AFLUTTER  The various methods of treatment have been discussed with the patient and family. After consideration of risks, benefits and other options for treatment, the patient has consented to  Procedure(s): CARDIOVERSION (N/A) as a surgical intervention .  The patient's history has been reviewed, patient examined, no change in status, stable for surgery.  I have reviewed the patient's chart and labs.  Questions were answered to the patient's satisfaction.     Nahser, Wonda Cheng

## 2015-02-18 NOTE — Anesthesia Postprocedure Evaluation (Signed)
  Anesthesia Post-op Note  Patient: [name]  Procedure(s) Performed: Procedure(s): CARDIOVERSION (N/A)  Patient Location: Endoscopy Unit  Anesthesia Type:MAC  Level of Consciousness: awake, alert , oriented and patient cooperative  Airway and Oxygen Therapy: Patient Spontanous Breathing and Patient connected to nasal cannula oxygen  Post-op Pain: none  Post-op Assessment: Post-op Vital signs reviewed, Patient's Cardiovascular Status Stable, Respiratory Function Stable, Patent Airway and No signs of Nausea or vomiting              Post-op Vital Signs: Reviewed and stable  Last Vitals:  Filed Vitals:   02/18/15 0835  BP: 104/57  Pulse: 60  Temp: 36.5 C  Resp: 15    Complications: No apparent anesthesia complications

## 2015-02-19 NOTE — Progress Notes (Signed)
Error. Pt checked in office 02/12/15.

## 2015-02-21 ENCOUNTER — Encounter: Payer: Self-pay | Admitting: Internal Medicine

## 2015-02-25 ENCOUNTER — Telehealth: Payer: Self-pay | Admitting: *Deleted

## 2015-02-25 NOTE — Telephone Encounter (Signed)
Wife made aware Dr. Caryl Comes not in office until Friday but I will send him a message with details of transmission. Verbalizes understanding.

## 2015-02-25 NOTE — Telephone Encounter (Signed)
Wife calling about remote transmission sent last night to evaluate current rhythm. Pt not feeling well for past several days, she reports DCCV about 2 weeks ago. Wants Dr. Caryl Comes to review transmission and advise further. Real-time freeze shows 1:1 conduction. Wife made aware.   I will have Dr. Caryl Comes review mode switches this afternoon and call back. Wife agreeable with plan.

## 2015-02-28 NOTE — Telephone Encounter (Signed)
Reviewed transmissions with Dr. Caryl Comes. 02/28/15 0938 transmission shows ATach at 125bpm with BiV pacing. Pt made aware- no medication changes at this time. Appt made for 03/05/15 at 845 with Dr. Caryl Comes. Pt and wife agreeable and appreciative.

## 2015-03-05 ENCOUNTER — Encounter: Payer: Self-pay | Admitting: Internal Medicine

## 2015-03-05 ENCOUNTER — Ambulatory Visit (INDEPENDENT_AMBULATORY_CARE_PROVIDER_SITE_OTHER): Payer: Medicare Other | Admitting: Internal Medicine

## 2015-03-05 VITALS — BP 124/60 | HR 60 | Ht 68.0 in | Wt 168.8 lb

## 2015-03-05 DIAGNOSIS — I4729 Other ventricular tachycardia: Secondary | ICD-10-CM

## 2015-03-05 DIAGNOSIS — I255 Ischemic cardiomyopathy: Secondary | ICD-10-CM

## 2015-03-05 DIAGNOSIS — I480 Paroxysmal atrial fibrillation: Secondary | ICD-10-CM

## 2015-03-05 DIAGNOSIS — I259 Chronic ischemic heart disease, unspecified: Secondary | ICD-10-CM

## 2015-03-05 DIAGNOSIS — I472 Ventricular tachycardia: Secondary | ICD-10-CM

## 2015-03-05 DIAGNOSIS — I5022 Chronic systolic (congestive) heart failure: Secondary | ICD-10-CM

## 2015-03-05 DIAGNOSIS — Z95810 Presence of automatic (implantable) cardiac defibrillator: Secondary | ICD-10-CM

## 2015-03-05 LAB — BASIC METABOLIC PANEL
BUN: 19 mg/dL (ref 7–25)
CHLORIDE: 100 mmol/L (ref 98–110)
CO2: 28 mmol/L (ref 20–31)
CREATININE: 1.47 mg/dL — AB (ref 0.70–1.18)
Calcium: 9.2 mg/dL (ref 8.6–10.3)
Glucose, Bld: 103 mg/dL — ABNORMAL HIGH (ref 65–99)
Potassium: 4.5 mmol/L (ref 3.5–5.3)
Sodium: 136 mmol/L (ref 135–146)

## 2015-03-05 LAB — MAGNESIUM: Magnesium: 2.3 mg/dL (ref 1.5–2.5)

## 2015-03-05 LAB — CUP PACEART INCLINIC DEVICE CHECK
Battery Remaining Longevity: 12.2 mo
HighPow Impedance: 51.6599
Lead Channel Impedance Value: 350 Ohm
Lead Channel Impedance Value: 375 Ohm
Lead Channel Impedance Value: 975 Ohm
Lead Channel Setting Pacing Amplitude: 2.5 V
Lead Channel Setting Pacing Amplitude: 2.5 V
Lead Channel Setting Pacing Amplitude: 2.5 V
Lead Channel Setting Pacing Pulse Width: 0.5 ms
Lead Channel Setting Pacing Pulse Width: 0.5 ms
Lead Channel Setting Sensing Sensitivity: 0.5 mV
MDC IDC PG SERIAL: 828324
MDC IDC SESS DTM: 20161005122548
MDC IDC SET ZONE DETECTION INTERVAL: 310 ms
MDC IDC SET ZONE DETECTION INTERVAL: 570 ms
MDC IDC STAT BRADY RA PERCENT PACED: 39 %
MDC IDC STAT BRADY RV PERCENT PACED: 96 %

## 2015-03-05 NOTE — Progress Notes (Signed)
[email or web address]      Patient Care Team: Reynold Bowen, MD as PCP - General (Endocrinology) Darlin Coco, MD as Consulting Physician (Cardiology) Deboraha Sprang, MD as Consulting Physician (Cardiology) Serafina Mitchell, MD as Consulting Physician (Vascular Surgery) Michael Boston, MD as Consulting Physician (General Surgery)   HPI  [name] is a 74 y.o. male Is seen in followup for recurrent ventricular tachycardia. He has been on multiple medications in the past, and underwent catheter ablation in the setting of VT storm September 2012. He had post ablation ventricular tachycardia and was started on amiodarone which had previously not been used because of concurrent hypothyroidism.   Because of recurrent ventricular tachycardia he was transferred to [facility] for incessant VT and underwent an ablation complicated by pericardial perforation and subsequent pericarditis.   He was seen a couple of weeks after the last visit in the emergency room with frequent ventricular ectopy almost in a pattern of accelerated idioventricular rhythm and we put him on mexiletine. This has been associated with a decrease in his ventricular burden from 38% to 6% and a marked improvement in his overall symptom complex.  His energy is improving.  He then had recurrent ventricular tachycardia , which was relatively slow at 110-130 beats per minute and underwent repeat catheter ablation in March 2103 by Dr. Rayann Heman   Ischemic cardiomyopathy with ejection fraction of 25-30% by echo in December 2000 well with multiple wall motion abnormalities ejection fraction 30%   He lacks energy  He felt much better folowwoig the cardioversion in June 2016   There has been gradual worseing of symptoms  He underwent cardioversion a few weeks ago but has had recurrent problems with symptoms and device interrogation remotely demonstrated an atrial tachycardia  He continues to feel lousy with poor energy following repeat  cardioversion.    Past Medical History  Diagnosis Date  . CAD (coronary artery disease) 1999    Remote anterolater and apical MI, cath 2011 patent stents RCA/LCx  LAD w/o obstruction  . Cardiomyopathy, ischemic     EF 20-25%  echo 2012  . Chronic systolic congestive heart failure ([place])   . ICD (implantable cardiac defibrillator) in place     [name]  . 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 Abdominal aortic aneurysm ([place]) 2009    Repaired with endovascular stent  . PVD (peripheral vascular disease) ([place])     Has occluded innominate vein  . Myocardial infarction ([place])   . Hypertension   . Ventricular tachycardia (HCC)     recurrent slow VT at 100-110 bpm,  Rx mexilitene/sotalol  . Left bundle branch block   . Systolic CHF, chronic ([place])   . Arthritis   . Gynecomastia     2/2 spironolactone  . Atrial fibrillation ([place])   . Pericarditis     due to perforation  . Inguinal hernia recurrent bilateral   . GERD (gastroesophageal reflux disease)   . Anemia 05/2014  . Cardiac arrest ([place]) 12/05/2013  . Acute on chronic systolic heart failure ([place]) 12/05/2013  . 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 SVT (supraventricular tachycardia) ([place]) 12/05/2013    Past Surgical History  Procedure Laterality Date  . Icd  Feb 2012    Change out with LV lead placed with tunneling from the right  to the left side  . Endovascular stent insertion      for AAA  . Cardiac catheterization  x2  . Pacemaker insertion    . Ventricular ablation surgery      s/p prior EPS and ablation at [facility] 9/12, [facility] 11/12, and most recent at [facility] 08/23/11  . Tonsillectomy    . V-tach ablation N/A 08/24/2011    Procedure: V-TACH ABLATION;  Surgeon: Thompson Grayer, MD;  Location[facility] CATH LAB;  Service: Cardiovascular;  Laterality: N/A;  . Inguinal hernia repair Bilateral 06/20/2014    Procedure: LAPAROSCOPIC BILATERAL INGUINAL HERNIA REPAIR;  Surgeon: Michael Boston,  MD;  Location: [place];  Service: General;  Laterality: Bilateral;  . Insertion of mesh N/A 06/20/2014    Procedure: INSERTION OF MESH;  Surgeon: Michael Boston, MD;  Location: [place];  Service: General;  Laterality: N/A;  . Cardioversion N/A 07/05/2014    Procedure: CARDIOVERSION;  Surgeon: Larey Dresser, MD;  Location: [place];  Service: Cardiovascular;  Laterality: N/A;  . Cardioversion N/A 11/22/2014    Procedure: CARDIOVERSION;  Surgeon: Thayer Headings, MD;  Location: [place];  Service: Cardiovascular;  Laterality: N/A;  . Cardioversion N/A 02/18/2015    Procedure: CARDIOVERSION;  Surgeon: Thayer Headings, MD;  Location: [place];  Service: Cardiovascular;  Laterality: N/A;    Current Outpatient Prescriptions  Medication Sig Dispense Refill  . acetaminophen (TYLENOL) 500 MG tablet Take 250 mg by mouth 2 (two) times daily.     [name] ALPRAZolam (XANAX) 0.25 MG tablet Take 0.25 mg by mouth every 4 (four) hours.    . Ascorbic Acid (VITAMIN C) 500 MG tablet Take 500 mg by mouth daily at 2 PM daily at 2 PM.     . carvedilol (COREG) 12.5 MG tablet Take 1 tablet (12.5 mg total) by mouth 2 (two) times daily. 180 tablet 3  . docusate sodium (COLACE) 100 MG capsule Take 100 mg by mouth daily at 2 PM daily at 2 PM.    . furosemide (LASIX) 20 MG tablet Take 20 mg by mouth 2 (two) times daily.    [name] levothyroxine (SYNTHROID, LEVOTHROID) 88 MCG tablet Take 1 1/2 tablet (132 mcg) on Friday, take 1 tablet (88 mcg) on all other days of the week 100 tablet 3  . loratadine (CLARITIN) 10 MG tablet Take 10 mg by mouth daily at 2 PM daily at 2 PM.     . losartan (COZAAR) 50 MG tablet Take 25 mg by mouth daily.    . meclizine (ANTIVERT) 25 MG tablet TAKE 1 TABLET (25 MG TOTAL) BY MOUTH 3 (THREE) TIMES DAILY AS NEEDED. 270 tablet 0  . mexiletine (MEXITIL) 150 MG capsule Take 150 mg by mouth 3 (three) times daily.    . Multiple Vitamin (MULTIVITAMIN WITH MINERALS) TABS tablet Take 1 tablet by mouth daily at 2  PM daily at 2 PM.     . Omega-3 Fatty Acids (FISH OIL) 1000 MG CAPS Take 1,000 mg by mouth 2 (two) times daily. 6am and 6pm    . ranitidine (ZANTAC) 150 MG tablet Take 150 mg by mouth 4 (four) times daily.     . rosuvastatin (CRESTOR) 5 MG tablet Take 5 mg by mouth. Take 1 tablet by mouth two times a week (Tuesdays Saturdays)    . sotalol (BETAPACE) 120 MG tablet Take 120 mg by mouth every 12 (twelve) hours.     [name] warfarin (COUMADIN) 2.5 MG tablet TAKE AS DIRECTED BY COUMADIN CLINIC 120 tablet 1   No current facility-administered medications for this visit.    Allergies  Allergen Reactions  .  Iohexol Hives and Itching     Code: HIVES, Desc: hives w/ itching during cardiac cath '99, mult caths since w/ ? premeds, DR.G.HAYES REQUESTS 13 HR PRE MED//A.C.pt okay w/13 hr prep/mms, Onset Date: [id number]   . Prednisone Hives    Review of Systems negative except from HPI and PMH  Physical Exam BP 124/60 mmHg  Pulse 60  Ht 5\' 8"  (1.727 m)  Wt 168 lb 12.8 oz (76.567 kg)  BMI 25.67 kg/m2 Well developed and well nourished in no acute distress HENT normal E scleral and icterus clear Neck Supple JVP 7 with HJR  carotids brisk and full Clear to ausculation Irregular rate and rhythm, no murmurs gallops or rub Soft with active bowel sounds No clubbing cyanosis none Edema Alert and oriented, grossly normal motor and sensory function Skin Warm and Dry  ECG demonstrates AV pacing at a rate consistent left ventricular activation  Assessment and  Plan  Vetnricular tachycardia  SVT--ATrial fluttter and fib  ischmeic cardiomyopathy  HFrEF   Early post cardioversion he had an atrial tachycardia. He is now back in atrial fibrillation/flutter. We have discussed the use of dofetilide; this would be an alternative to sotalol. He was unable to tolerate amiodarone in the past. We will also consider   evaluation with Dr. Rayann Heman but as he has ventricular tachycardia which needs antiarrhythmics  therapy if he tolerates that dofetilide and it suppress his atrial arrhythmia deferring that evaluation for the present would be reasonable

## 2015-03-05 NOTE — Patient Instructions (Signed)
Medication Instructions: - Stop sotalol- DO NOT take your dose tonight  Labwork: - Your physician recommends that you have lab work today: BMP/ Magnesium  Procedures/Testing: - none  Follow-Up: - to be determined at discharge  Any Additional Special Instructions Will Be Listed Below (If Applicable). - Plan is to admit you to [facility] tomorrow for Tikosyn loading (this will be dependent on the results of your labs from today)

## 2015-03-06 ENCOUNTER — Inpatient Hospital Stay (HOSPITAL_COMMUNITY)
Admission: AD | Admit: 2015-03-06 | Discharge: 2015-03-10 | DRG: 308 | Disposition: A | Discharge status: Home / Self Care | Payer: Medicare Other | Source: Ambulatory Visit | Attending: Internal Medicine | Admitting: Internal Medicine

## 2015-03-06 ENCOUNTER — Encounter (HOSPITAL_COMMUNITY): Payer: Self-pay | Admitting: Nurse Practitioner

## 2015-03-06 DIAGNOSIS — M1990 Unspecified osteoarthritis, unspecified site: Secondary | ICD-10-CM | POA: Diagnosis present

## 2015-03-06 DIAGNOSIS — I2510 Atherosclerotic heart disease of native coronary artery without angina pectoris: Secondary | ICD-10-CM | POA: Diagnosis present

## 2015-03-06 DIAGNOSIS — Z8674 Personal history of sudden cardiac arrest: Secondary | ICD-10-CM

## 2015-03-06 DIAGNOSIS — I5023 Acute on chronic systolic (congestive) heart failure: Secondary | ICD-10-CM | POA: Diagnosis present

## 2015-03-06 DIAGNOSIS — Z95810 Presence of automatic (implantable) cardiac defibrillator: Secondary | ICD-10-CM | POA: Diagnosis present

## 2015-03-06 DIAGNOSIS — I252 Old myocardial infarction: Secondary | ICD-10-CM

## 2015-03-06 DIAGNOSIS — Z9861 Coronary angioplasty status: Secondary | ICD-10-CM

## 2015-03-06 DIAGNOSIS — I481 Persistent atrial fibrillation: Principal | ICD-10-CM | POA: Diagnosis present

## 2015-03-06 DIAGNOSIS — I472 Ventricular tachycardia: Secondary | ICD-10-CM | POA: Diagnosis present

## 2015-03-06 DIAGNOSIS — Z823 Family history of stroke: Secondary | ICD-10-CM

## 2015-03-06 DIAGNOSIS — K219 Gastro-esophageal reflux disease without esophagitis: Secondary | ICD-10-CM | POA: Diagnosis present

## 2015-03-06 DIAGNOSIS — I4892 Unspecified atrial flutter: Secondary | ICD-10-CM | POA: Diagnosis present

## 2015-03-06 DIAGNOSIS — I255 Ischemic cardiomyopathy: Secondary | ICD-10-CM | POA: Diagnosis present

## 2015-03-06 DIAGNOSIS — I739 Peripheral vascular disease, unspecified: Secondary | ICD-10-CM | POA: Diagnosis present

## 2015-03-06 DIAGNOSIS — Z8249 Family history of ischemic heart disease and other diseases of the circulatory system: Secondary | ICD-10-CM | POA: Diagnosis not present

## 2015-03-06 DIAGNOSIS — I447 Left bundle-branch block, unspecified: Secondary | ICD-10-CM | POA: Diagnosis present

## 2015-03-06 DIAGNOSIS — I110 Hypertensive heart disease with heart failure: Secondary | ICD-10-CM | POA: Diagnosis present

## 2015-03-06 DIAGNOSIS — I4819 Other persistent atrial fibrillation: Secondary | ICD-10-CM | POA: Diagnosis present

## 2015-03-06 HISTORY — DX: Other persistent atrial fibrillation: I48.19

## 2015-03-06 LAB — BASIC METABOLIC PANEL
ANION GAP: 8 (ref 5–15)
BUN: 14 mg/dL (ref 6–20)
CHLORIDE: 98 mmol/L — AB (ref 101–111)
CO2: 29 mmol/L (ref 22–32)
Calcium: 9.6 mg/dL (ref 8.9–10.3)
Creatinine, Ser: 1.33 mg/dL — ABNORMAL HIGH (ref 0.61–1.24)
GFR calc non Af Amer: 51 mL/min — ABNORMAL LOW (ref >60)
GFR, EST AFRICAN AMERICAN: 60 mL/min — AB (ref >60)
Glucose, Bld: 109 mg/dL — ABNORMAL HIGH (ref 65–99)
Potassium: 4.6 mmol/L (ref 3.5–5.1)
SODIUM: 135 mmol/L (ref 135–145)

## 2015-03-06 LAB — PROTIME-INR
INR: 2.83 — AB (ref 0.00–1.49)
PROTHROMBIN TIME: 29.3 s — AB (ref 11.6–15.2)

## 2015-03-06 LAB — MAGNESIUM: MAGNESIUM: 2.3 mg/dL (ref 1.7–2.4)

## 2015-03-06 MED ORDER — SODIUM CHLORIDE 0.9 % IJ SOLN
3.0000 mL | Freq: Two times a day (BID) | INTRAMUSCULAR | Status: DC
  Administered 2015-03-06 – 2015-03-09 (×7): 3 mL via INTRAVENOUS

## 2015-03-06 MED ORDER — ACETAMINOPHEN 500 MG PO TABS: 250.0000 mg | ORAL_TABLET | Freq: Two times a day (BID) | ORAL | Status: DC | PRN

## 2015-03-06 MED ORDER — DOFETILIDE 250 MCG PO CAPS
250.0000 ug | ORAL_CAPSULE | Freq: Two times a day (BID) | ORAL | Status: DC
  Administered 2015-03-07 – 2015-03-10 (×7): 250 ug via ORAL
  Filled 2015-03-06 (×9): qty 1

## 2015-03-06 MED ORDER — WARFARIN - PHARMACIST DOSING INPATIENT: Freq: Every day | Status: DC

## 2015-03-06 MED ORDER — LOSARTAN POTASSIUM 25 MG PO TABS
25.0000 mg | ORAL_TABLET | Freq: Every day | ORAL | Status: DC
  Administered 2015-03-07 – 2015-03-10 (×4): 25 mg via ORAL
  Filled 2015-03-06 (×4): qty 1

## 2015-03-06 MED ORDER — CARVEDILOL 12.5 MG PO TABS
12.5000 mg | ORAL_TABLET | Freq: Two times a day (BID) | ORAL | Status: DC
  Administered 2015-03-06 – 2015-03-10 (×8): 12.5 mg via ORAL
  Filled 2015-03-06 (×9): qty 1

## 2015-03-06 MED ORDER — LEVALBUTEROL HCL 0.63 MG/3ML IN NEBU
0.6300 mg | INHALATION_SOLUTION | Freq: Four times a day (QID) | RESPIRATORY_TRACT | Status: DC
  Administered 2015-03-06: 0.63 mg via RESPIRATORY_TRACT
  Filled 2015-03-06: qty 3

## 2015-03-06 MED ORDER — LEVOTHYROXINE SODIUM 88 MCG PO TABS
88.0000 ug | ORAL_TABLET | Freq: Every day | ORAL | Status: DC
  Administered 2015-03-07 – 2015-03-10 (×4): 88 ug via ORAL
  Filled 2015-03-06 (×5): qty 1

## 2015-03-06 MED ORDER — MEXILETINE HCL 150 MG PO CAPS
150.0000 mg | ORAL_CAPSULE | Freq: Three times a day (TID) | ORAL | Status: DC
  Administered 2015-03-06 – 2015-03-10 (×11): 150 mg via ORAL
  Filled 2015-03-06 (×17): qty 1

## 2015-03-06 MED ORDER — SODIUM CHLORIDE 0.9 % IJ SOLN: 3.0000 mL | INTRAMUSCULAR | Status: DC | PRN

## 2015-03-06 MED ORDER — FUROSEMIDE 10 MG/ML IJ SOLN
INTRAMUSCULAR | Status: AC
  Filled 2015-03-06: qty 4

## 2015-03-06 MED ORDER — LORATADINE 10 MG PO TABS
10.0000 mg | ORAL_TABLET | Freq: Every day | ORAL | Status: DC
  Administered 2015-03-07 – 2015-03-09 (×3): 10 mg via ORAL
  Filled 2015-03-06 (×3): qty 1

## 2015-03-06 MED ORDER — WARFARIN SODIUM 7.5 MG PO TABS
3.7500 mg | ORAL_TABLET | ORAL | Status: AC
  Administered 2015-03-06: 3.75 mg via ORAL
  Filled 2015-03-06: qty 2

## 2015-03-06 MED ORDER — WARFARIN SODIUM 2.5 MG PO TABS
2.5000 mg | ORAL_TABLET | ORAL | Status: AC
  Administered 2015-03-08: 2.5 mg via ORAL
  Filled 2015-03-06: qty 1

## 2015-03-06 MED ORDER — ALPRAZOLAM 0.25 MG PO TABS
0.2500 mg | ORAL_TABLET | ORAL | Status: DC
  Administered 2015-03-06 – 2015-03-10 (×15): 0.25 mg via ORAL
  Filled 2015-03-06 (×16): qty 1

## 2015-03-06 MED ORDER — VITAMIN C 500 MG PO TABS
500.0000 mg | ORAL_TABLET | Freq: Every day | ORAL | Status: DC
  Administered 2015-03-07 – 2015-03-09 (×3): 500 mg via ORAL
  Filled 2015-03-06 (×3): qty 1

## 2015-03-06 MED ORDER — DOCUSATE SODIUM 100 MG PO CAPS
100.0000 mg | ORAL_CAPSULE | Freq: Every day | ORAL | Status: DC
Start: 2015-03-07 — End: 2015-03-10
  Administered 2015-03-07 – 2015-03-09 (×3): 100 mg via ORAL
  Filled 2015-03-06 (×3): qty 1

## 2015-03-06 MED ORDER — FAMOTIDINE 20 MG PO TABS
20.0000 mg | ORAL_TABLET | Freq: Every day | ORAL | Status: DC
  Administered 2015-03-06 – 2015-03-09 (×4): 20 mg via ORAL
  Filled 2015-03-06 (×6): qty 1

## 2015-03-06 MED ORDER — ADULT MULTIVITAMIN W/MINERALS CH
1.0000 | ORAL_TABLET | Freq: Every day | ORAL | Status: DC
  Administered 2015-03-07 – 2015-03-09 (×3): 1 via ORAL
  Filled 2015-03-06 (×3): qty 1

## 2015-03-06 MED ORDER — FUROSEMIDE 10 MG/ML IJ SOLN
40.0000 mg | Freq: Once | INTRAMUSCULAR | Status: AC
  Administered 2015-03-06: 40 mg via INTRAVENOUS

## 2015-03-06 MED ORDER — FUROSEMIDE 20 MG PO TABS
20.0000 mg | ORAL_TABLET | Freq: Two times a day (BID) | ORAL | Status: DC
  Administered 2015-03-07: 20 mg via ORAL
  Filled 2015-03-06: qty 1

## 2015-03-06 MED ORDER — SODIUM CHLORIDE 0.9 % IV SOLN: 250.0000 mL | INTRAVENOUS | Status: DC | PRN

## 2015-03-06 NOTE — H&P (Signed)
ELECTROPHYSIOLOGY HISTORY AND PHYSICAL     Patient ID: [name] MRN: [id number], DOB/AGE: 09/20/1940 74 y.o.  Admit date: 03/06/2015 Date of Consult: 03/06/2015  Primary Physician: Sheela Stack, MD Primary Cardiologist: Mare Ferrari Electrophysiologist: Caryl Comes  CC: here for Tikosyn load  HPI:  [name] is a 74 y.o. male with a past medical history significant for CAD, ICM, VT (s/p ablation 2012 with Dr Rayann Heman, repeat ablation at [facility] complicated by pericardial perforation and pericarditis, repeat ablation 2013 by Dr Rayann Heman), chronic systolic heart failure, hypertension, thyrotoxicosis on amiodarone in the past, and persistent atrial fibrillation.  He has been maintained on Sotalol and Mexiletine for atrial and ventricular arrhythmias.  He has developed recurrent atrial fibrillation and is status post cardioversion late September with ERAF.  He is symptomatic with his atrial fibrillation with fatigue and exercise intolerance. He was seen in the office by Dr Caryl Comes and Tikosyn initiation was recommended.  He presents today for Tikosyn load.  Last echo 11/2013 demonstrated EF 20-25%, mild LVH, grade 2 diastolic dysfunction, apical aneurysm, LA 43.   He denies chest pain. He has chronic stable dyspnea on exertion.  He also has palpitations that are short lived and not associated with dizziness or syncope.   Past Medical History  Diagnosis Date  . CAD (coronary artery disease) 1999    Remote anterolater and apical MI, cath 2011 patent stents RCA/LCx  LAD w/o obstruction  . Cardiomyopathy, ischemic     EF 20-25%  echo 2012  . Chronic systolic congestive heart failure ([place])   . 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 Abdominal aortic aneurysm ([place]) 2009    Repaired with endovascular stent  . PVD (peripheral vascular disease) ([place])     Has occluded innominate vein  . Myocardial infarction ([place])   . Hypertension   . Ventricular tachycardia (HCC)     recurrent  slow VT at 100-110 bpm,  Rx mexilitene/sotalol  . Left bundle branch block   . Arthritis   . Gynecomastia     2/2 spironolactone  . Persistent atrial fibrillation ([place])   . Pericarditis     due to perforation  . Inguinal hernia recurrent bilateral   . GERD (gastroesophageal reflux disease)   . Anemia 05/2014  . Cardiac arrest ([place]) 12/05/2013  . SVT (supraventricular tachycardia) ([place]) 12/05/2013     Surgical History:  Past Surgical History  Procedure Laterality Date  . Icd  Feb 2012    Change out with LV lead placed with tunneling from the right  to the left side  . Endovascular stent insertion      for AAA  . Cardiac catheterization      x2  . Pacemaker insertion    . Ventricular ablation surgery      s/p prior EPS and ablation at [facility] 9/12, [facility] 11/12, and most recent at [facility] 08/23/11  . Tonsillectomy    . V-tach ablation N/A 08/24/2011    Procedure: V-TACH ABLATION;  Surgeon: Thompson Grayer, MD;  Location: [facility] CATH LAB;  Service: Cardiovascular;  Laterality: N/A;  . Inguinal hernia repair Bilateral 06/20/2014    Procedure: LAPAROSCOPIC BILATERAL INGUINAL HERNIA REPAIR;  Surgeon: Michael Boston, MD;  Location: [place];  Service: General;  Laterality: Bilateral;  . Insertion of mesh N/A 06/20/2014    Procedure: INSERTION OF MESH;  Surgeon: Michael Boston, MD;  Location: [place];  Service: General;  Laterality: N/A;  . Cardioversion N/A 07/05/2014  Procedure: CARDIOVERSION;  Surgeon: Larey Dresser, MD;  Location: [place];  Service: Cardiovascular;  Laterality: N/A;  . Cardioversion N/A 11/22/2014    Procedure: CARDIOVERSION;  Surgeon: Thayer Headings, MD;  Location: [place];  Service: Cardiovascular;  Laterality: N/A;  . Cardioversion N/A 02/18/2015    Procedure: CARDIOVERSION;  Surgeon: Thayer Headings, MD;  Location: [facility] ENDOSCOPY;  Service: Cardiovascular;  Laterality: N/A;     Prescriptions prior to admission  Medication Sig Dispense Refill Last Dose  .  acetaminophen (TYLENOL) 500 MG tablet Take 250 mg by mouth 2 (two) times daily.    Taking  . ALPRAZolam (XANAX) 0.25 MG tablet Take 0.25 mg by mouth every 4 (four) hours.   Taking  . Ascorbic Acid (VITAMIN C) 500 MG tablet Take 500 mg by mouth daily at 2 PM daily at 2 PM.    Taking  . carvedilol (COREG) 12.5 MG tablet Take 1 tablet (12.5 mg total) by mouth 2 (two) times daily. 180 tablet 3 Taking  . docusate sodium (COLACE) 100 MG capsule Take 100 mg by mouth daily at 2 PM daily at 2 PM.   Taking  . furosemide (LASIX) 20 MG tablet Take 20 mg by mouth 2 (two) times daily.   Taking  . levothyroxine (SYNTHROID, LEVOTHROID) 88 MCG tablet Take 1 1/2 tablet (132 mcg) on Friday, take 1 tablet (88 mcg) on all other days of the week 100 tablet 3 Taking  . loratadine (CLARITIN) 10 MG tablet Take 10 mg by mouth daily at 2 PM daily at 2 PM.    Taking  . losartan (COZAAR) 50 MG tablet Take 25 mg by mouth daily.   Taking  . meclizine (ANTIVERT) 25 MG tablet TAKE 1 TABLET (25 MG TOTAL) BY MOUTH 3 (THREE) TIMES DAILY AS NEEDED. 270 tablet 0 Taking  . mexiletine (MEXITIL) 150 MG capsule Take 150 mg by mouth 3 (three) times daily.   Taking  . Multiple Vitamin (MULTIVITAMIN WITH MINERALS) TABS tablet Take 1 tablet by mouth daily at 2 PM daily at 2 PM.    Taking  . Omega-3 Fatty Acids (FISH OIL) 1000 MG CAPS Take 1,000 mg by mouth 2 (two) times daily. 6am and 6pm   Taking  . ranitidine (ZANTAC) 150 MG tablet Take 150 mg by mouth 4 (four) times daily.    Taking  . rosuvastatin (CRESTOR) 5 MG tablet Take 5 mg by mouth. Take 1 tablet by mouth two times a week (Tuesdays Saturdays)   Taking  . warfarin (COUMADIN) 2.5 MG tablet TAKE AS DIRECTED BY COUMADIN CLINIC 120 tablet 1 Taking    Inpatient Medications:   Allergies:  Allergies  Allergen Reactions  . Iohexol Hives and Itching     Code: HIVES, Desc: hives w/ itching during cardiac cath '99, mult caths since w/ ? premeds, DR.G.HAYES REQUESTS 13 HR PRE MED//A.C.pt  okay w/13 hr prep/mms, Onset Date: [id number]   . Prednisone Hives    Social History   Social History  . Marital Status: Married    Spouse Name: N/A  . Number of Children: N/A  . Years of Education: N/A   Occupational History  . retired      used to Armed forces operational officer   Social History Main Topics  . Smoking status: Former Smoker    Quit date: 01/07/1994  . Smokeless tobacco: Never Used  . Alcohol Use: 0.0 oz/week    0 Standard drinks or equivalent per week     Comment: very rare  .  Drug Use: No  . Sexual Activity: Not Currently   Other Topics Concern  . Not on file   Social History Narrative     Family History  Problem Relation Age of Onset  . Stroke Mother   . Heart attack Mother   . Heart disease Mother   . Hyperlipidemia Mother   . Hypertension Mother   . Heart attack Father   . Heart disease Father   . Hyperlipidemia Father   . Hypertension Father   . Heart disease Sister   . Hyperlipidemia Sister   . Hypertension Brother      Review of Systems: All other systems reviewed and are otherwise negative except as noted above.  Physical Exam: Filed Vitals:   03/06/15 1533  BP: 122/64  Pulse: 65  Temp: 97.6 F (36.4 C)  TempSrc: Oral  Resp: 18  Height: 5\' 8"  (1.727 m)  SpO2: 100%    GEN- The patient is well appearing, alert and oriented x 3 today.   HEENT: normocephalic, atraumatic; sclera clear, conjunctiva pink; hearing intact; oropharynx clear; neck supple  Lungs- Clear to ausculation bilaterally, normal work of breathing.  No wheezes, rales, rhonchi Heart- Irregular rate and rhythm  GI- soft, non-tender, non-distended, bowel sounds present Extremities- no clubbing, cyanosis, or edema; DP/PT/radial pulses 1+ bilaterally MS- no significant deformity or atrophy Skin- warm and dry, no rash or lesion Psych- euthymic mood, full affect Neuro- strength and sensation are intact  Labs:   Lab Results  Component Value Date   WBC 9.7 01/19/2015   HGB  13.9 02/18/2015   HCT 41.0 02/18/2015   MCV 95.2 01/19/2015   PLT 211 01/19/2015     Recent Labs Lab 03/05/15 1029  NA 136  K 4.5  CL 100  CO2 28  BUN 19  CREATININE 1.47*  CALCIUM 9.2  GLUCOSE 103*    [id number] atrial flutter, V paced, rate 60, QTc 476 w paced QRS  Assessment/Plan: 1.  Persistent atrial fibrillation The patient has symptomatic persistent atrial fibrillation and has failed medical therapy in the past with amiodarone and sotalol.  He was seen by Dr Caryl Comes in the office who recommended Tikosyn loading.  He took his last dose of Sotalol yesterday morning. Discussed with Dr Caryl Comes today - he is comfortable with starting Tikosyn tonight.  He would like to continue Mexiletine along with Tikosyn for control of ventricular arrhythmias.  QTc stable by office EKG yesterday with paced rhythm K and Mg stable Peyson Delao start Tikosyn 251mcg twice daily tonight (he would like 6P and 6A dosing) Continue Warfarin for CHADS2VASC of at least 5 Monitor QTc, K, Mg Omeed Osuna need DCCV on Saturday if remains in atrial flutter Tynetta Bachmann update echo while here  2.  Ventricular tachycardia Continue Mexiletine  3.  ICM/CAD No recent ischemic symptoms Continue current therapy  4.  HTN Stable No change required today   Signed, Chanetta Marshall, NP 03/06/2015 3:50 PM  I have seen and examined this patient with Chanetta Marshall. Agree with above, note added to reflect my findings. On exam, irregular, no murmurs or wheezing. Has atrial fibrillation that is persistent and has failed multiple drugs including amiodarone and sotalol. Plan for tikosyn loading at 250 mg BID. Aileena Iglesia continue warfarin and Addalynne Golding cardiovert if necessary over the weekend.   Martise Waddell M. Latasha Puskas MD 03/07/2015 7:34 AM

## 2015-03-06 NOTE — Progress Notes (Signed)
ANTICOAGULATION CONSULT NOTE - Initial Consult  Pharmacy Consult for Warfarin Indication: atrial fibrillation  Allergies  Allergen Reactions  . Iohexol Hives and Itching     Code: HIVES, Desc: hives w/ itching during cardiac cath '99, mult caths since w/ ? premeds, DR.G.HAYES REQUESTS 13 HR PRE MED//A.C.pt okay w/13 hr prep/mms, Onset Date: [id number]   . Prednisone Hives    Patient Measurements: Height: 5\' 8"  (172.7 cm) IBW/kg (Calculated) : 68.4 Vital Signs: Temp: 97.6 F (36.4 C) (10/06 1533) Temp Source: Oral (10/06 1533) BP: 122/64 mmHg (10/06 1533) Pulse Rate: 65 (10/06 1533) Labs:  Recent Labs  03/05/15 1029  CREATININE 1.47*    Estimated Creatinine Clearance: 43.3 mL/min (by C-G formula based on Cr of 1.47).   Medical History: Past Medical History  Diagnosis Date  . CAD (coronary artery disease) 1999    Remote anterolater and apical MI, cath 2011 patent stents RCA/LCx  LAD w/o obstruction  . Cardiomyopathy, ischemic     EF 20-25%  echo 2012  . Chronic systolic congestive heart failure ([place])   . 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 Abdominal aortic aneurysm ([place]) 2009    Repaired with endovascular stent  . PVD (peripheral vascular disease) ([place])     Has occluded innominate vein  . Myocardial infarction ([place])   . Hypertension   . Ventricular tachycardia (HCC)     recurrent slow VT at 100-110 bpm,  Rx mexilitene/sotalol  . Left bundle branch block   . Arthritis   . Gynecomastia     2/2 spironolactone  . Persistent atrial fibrillation ([place])   . Pericarditis     due to perforation  . Inguinal hernia recurrent bilateral   . GERD (gastroesophageal reflux disease)   . Anemia 05/2014  . Cardiac arrest ([place]) 12/05/2013  . SVT (supraventricular tachycardia) ([place]) 12/05/2013   Assessment: 74 year old male admitted for Tikosyn initiation with persistent atrial fibrillation to continue warfarin therapy for anticoagulation. CHADSVASC= 5. Last dose of warfarin  was yesterday 03/05/15 at 1800. Home regimen is 2.5mg  (1 tablet) on Tuesdays and Saturdays and 3.75mg  (1 1/2 tablets) all other days. CBC, LFTs, and albumin are within normal limits as of 02/06/15. INR this PM is therapeutic at 2.83.   Goal of Therapy:  INR 2-3 Monitor platelets by anticoagulation protocol: Yes   Plan:  Continue home warfarin dose of 2.5mg  on Tuesday and Saturday and 3.75mg  all other days.  Daily PT/INR.  Monitor for signs and symptoms of bleeding.    Sloan Leiter, PharmD, BCPS Clinical Pharmacist [phone] 03/06/2015,5:01 PM

## 2015-03-06 NOTE — Progress Notes (Signed)
Pharmacy Review for Dofetilide (Tikosyn) Initiation  Admit Complaint: 74 y.o. male admitted 03/06/2015 with atrial fibrillation to be initiated on dofetilide.   Assessment:  Patient Exclusion Criteria: If any screening criteria checked as "Yes", then  patient  should NOT receive dofetilide until criteria item is corrected. If "Yes" please indicate correction plan.  YES  NO Patient  Exclusion Criteria Correction Plan  []  [x]  Baseline QTc interval is greater than or equal to 440 msec. IF above YES box checked dofetilide contraindicated unless patient has ICD; then may proceed if QTc 500-550 msec or with known ventricular conduction abnormalities may proceed with QTc 550-600 msec. QTc = 476 on EKG 03/05/15 in Office.    []  [x]  Magnesium level is less than 1.8 mEq/l : Last magnesium:  Lab Results  Component Value Date   MG 2.3 03/05/2015         []  [x]  Potassium level is less than 4 mEq/l : Last potassium:  Lab Results  Component Value Date   K 4.5 03/05/2015         []  [x]  Patient is known or suspected to have a digoxin level greater than 2 ng/ml: No results found for: DIGOXIN    []  [x]  Creatinine clearance less than 20 ml/min (calculated using Cockcroft-Gault, actual body weight and serum creatinine): Estimated Creatinine Clearance: 43.3 mL/min (by C-G formula based on Cr of 1.47).    []  [x]  Patient has received drugs known to prolong the QT intervals within the last 48 hours (phenothiazines, tricyclics or tetracyclic antidepressants, erythromycin, H-1 antihistamines, cisapride, fluoroquinolones, azithromycin). Drugs not listed above may have an, as yet, undetected potential to prolong the QT interval, updated information on QT prolonging agents is available at this website:QT prolonging agents   []  [x]  Patient received a dose of hydrochlorothiazide (Oretic) alone or in any combination including triamterene (Dyazide, Maxzide) in the last 48 hours.   []  [x]  Patient received a medication  known to increase dofetilide plasma concentrations prior to initial dofetilide dose:  . Trimethoprim (Primsol, Proloprim) in the last 36 hours . Verapamil (Calan, Verelan) in the last 36 hours or a sustained release dose in the last 72 hours . Megestrol (Megace) in the last 5 days  . Cimetidine (Tagamet) in the last 6 hours . Ketoconazole (Nizoral) in the last 24 hours . Itraconazole (Sporanox) in the last 48 hours  . Prochlorperazine (Compazine) in the last 36 hours    []  [x]  Patient is known to have a history of torsades de pointes; congenital or acquired long QT syndromes.   [x]  []  Patient has received a Class 1 antiarrhythmic with less than 2 half-lives since last dose. (Disopyramide, Quinidine, Procainamide, Lidocaine, Mexiletine, Flecainide, Propafenone) Mexiletine- Dr. Caryl Comes is aware and would like to continue for VT. Monitor. ICD in place.   []  [x]  Patient has received amiodarone therapy in the past 3 months or amiodarone level is greater than 0.3 ng/ml.    Patient has been appropriately anticoagulated with warfarin.  Ordering provider was confirmed at [email or web address] if they are not listed on the [facility] Prescribers list.  Goal of Therapy: Follow renal function, electrolytes, potential drug interactions, and dose adjustment. Provide education and 1 week supply at discharge.  Plan:  [x]   Physician selected initial dose within range recommended for patients level of renal function - will monitor for response.  []   Physician selected initial dose outside of range recommended for patients level of renal function - will discuss if the dose should be altered  at this time.   Select One Calculated CrCl  Dose q12h  []  > 60 ml/min 500 mcg  [x]  40-60 ml/min 250 mcg  []  20-40 ml/min 125 mcg   2. Follow up QTc after the first 5 doses, renal function, electrolytes (K & Mg) daily x 3     days, dose adjustment, success of initiation and facilitate 1 week discharge supply as      clinically indicated.  3. Initiate Tikosyn education video (Call [phone] and ask for video # [phone]).  4. Place Enrollment Form on the chart for discharge supply of dofetilide.   Brain Hilts 4:41 PM 03/06/2015

## 2015-03-07 DIAGNOSIS — Z95810 Presence of automatic (implantable) cardiac defibrillator: Secondary | ICD-10-CM

## 2015-03-07 DIAGNOSIS — I255 Ischemic cardiomyopathy: Secondary | ICD-10-CM

## 2015-03-07 DIAGNOSIS — I481 Persistent atrial fibrillation: Principal | ICD-10-CM

## 2015-03-07 DIAGNOSIS — I5023 Acute on chronic systolic (congestive) heart failure: Secondary | ICD-10-CM

## 2015-03-07 LAB — BASIC METABOLIC PANEL
ANION GAP: 12 (ref 5–15)
BUN: 13 mg/dL (ref 6–20)
CHLORIDE: 98 mmol/L — AB (ref 101–111)
CO2: 26 mmol/L (ref 22–32)
Calcium: 9.4 mg/dL (ref 8.9–10.3)
Creatinine, Ser: 1.35 mg/dL — ABNORMAL HIGH (ref 0.61–1.24)
GFR, EST AFRICAN AMERICAN: 59 mL/min — AB (ref >60)
GFR, EST NON AFRICAN AMERICAN: 50 mL/min — AB (ref >60)
Glucose, Bld: 133 mg/dL — ABNORMAL HIGH (ref 65–99)
POTASSIUM: 4 mmol/L (ref 3.5–5.1)
SODIUM: 136 mmol/L (ref 135–145)

## 2015-03-07 LAB — MAGNESIUM: MAGNESIUM: 2.2 mg/dL (ref 1.7–2.4)

## 2015-03-07 LAB — PROTIME-INR
INR: 2.78 — AB (ref 0.00–1.49)
PROTHROMBIN TIME: 28.9 s — AB (ref 11.6–15.2)

## 2015-03-07 MED ORDER — WARFARIN SODIUM 2.5 MG PO TABS: 2.5000 mg | ORAL_TABLET | ORAL | Status: DC

## 2015-03-07 MED ORDER — FUROSEMIDE 40 MG PO TABS
40.0000 mg | ORAL_TABLET | Freq: Every day | ORAL | Status: DC
  Administered 2015-03-08 – 2015-03-10 (×3): 40 mg via ORAL
  Filled 2015-03-07 (×3): qty 1

## 2015-03-07 MED ORDER — WARFARIN SODIUM 7.5 MG PO TABS
3.7500 mg | ORAL_TABLET | ORAL | Status: DC
  Administered 2015-03-07 – 2015-03-09 (×2): 3.75 mg via ORAL
  Filled 2015-03-07 (×3): qty 2

## 2015-03-07 MED ORDER — LEVALBUTEROL HCL 0.63 MG/3ML IN NEBU
0.6300 mg | INHALATION_SOLUTION | Freq: Four times a day (QID) | RESPIRATORY_TRACT | Status: DC | PRN
  Administered 2015-03-07 – 2015-03-08 (×2): 0.63 mg via RESPIRATORY_TRACT
  Filled 2015-03-07 (×2): qty 3

## 2015-03-07 MED ORDER — FUROSEMIDE 10 MG/ML IJ SOLN
40.0000 mg | Freq: Once | INTRAMUSCULAR | Status: AC
  Administered 2015-03-07: 40 mg via INTRAVENOUS
  Filled 2015-03-07: qty 4

## 2015-03-07 NOTE — Progress Notes (Addendum)
Spoke with NP Lynnell Jude.  Ordered to give AM dose of tikosyn.  Claudette Stapler, RN

## 2015-03-07 NOTE — Progress Notes (Signed)
SUBJECTIVE: The patient is doing well today.  At this time, he denies chest pain, shortness of breath, or any new concerns.  Admitted 03-06-15 for Tikosyn load. Tikosyn held last night 2/2 concerns about QTc  CURRENT MEDICATIONS: . ALPRAZolam  0.25 mg Oral Q4H while awake  . carvedilol  12.5 mg Oral BID  . docusate sodium  100 mg Oral Q1400  . dofetilide  250 mcg Oral BID  . famotidine  20 mg Oral QHS  . furosemide  20 mg Oral BID  . levothyroxine  88 mcg Oral QAC breakfast  . loratadine  10 mg Oral Q1400  . losartan  25 mg Oral Q0600  . mexiletine  150 mg Oral TID  . multivitamin with minerals  1 tablet Oral Q1400  . sodium chloride  3 mL Intravenous Q12H  . vitamin C  500 mg Oral Q1400  . [START ON 03/08/2015] warfarin  2.5 mg Oral Once per day on Tue Sat  . Warfarin - Pharmacist Dosing Inpatient   Does not apply q1800      OBJECTIVE: Physical Exam: Filed Vitals:   03/06/15 1533 03/06/15 2037 03/06/15 2355 03/07/15 0351  BP: 122/64 130/60  117/73  Pulse: 65 63 72   Temp: 97.6 F (36.4 C) 98.3 F (36.8 C)  97.8 F (36.6 C)  TempSrc: Oral Oral  Oral  Resp: 18 12 16 16   Height: 5\' 8"  (1.727 m)     SpO2: 100% 96%  95%    Intake/Output Summary (Last 24 hours) at 03/07/15 7989 Last data filed at 03/07/15 2119  Gross per 24 hour  Intake      0 ml  Output    775 ml  Net   -775 ml    Telemetry reveals atrial flutter w V pacing  GEN- The patient is well appearing, alert and oriented x 3 today.   Head- normocephalic, atraumatic Eyes-  Sclera clear, conjunctiva pink Ears- hearing intact Oropharynx- clear Neck- supple  Lungs- Clear to ausculation bilaterally, normal work of breathing Heart- Irregular rate and rhythm GI- soft, NT, ND, + BS Extremities- no clubbing, cyanosis, or edema Skin- no rash or lesion Psych- euthymic mood, full affect Neuro- strength and sensation are intact  LABS: Basic Metabolic Panel:  Recent Labs  03/06/15 1731 03/07/15 0400    NA 135 136  K 4.6 4.0  CL 98* 98*  CO2 29 26  GLUCOSE 109* 133*  BUN 14 13  CREATININE 1.33* 1.35*  CALCIUM 9.6 9.4  MG 2.3 2.2    ASSESSMENT AND PLAN:  Active Problems:   Persistent atrial fibrillation (HCC)  1. Persistent atrial fibrillation The patient has symptomatic persistent atrial fibrillation and has failed medical therapy in the past with amiodarone and sotalol. He was seen by Dr Caryl Comes in the office who recommended Tikosyn loading.   QTc stable for Tikosyn initiation with paced rhythm K and Mg stable Helaman Mecca start Tikosyn 264mcg twice daily today (he would like 6P and 6A dosing) Continue Warfarin for CHADS2VASC of at least 5 Monitor QTc, K, Mg Laquashia Mergenthaler need DCCV this weekend if remains in atrial flutter Ozella Comins update echo while here  2. Ventricular tachycardia Continue Mexiletine  3. ICM/CAD No recent ischemic symptoms Continue current therapy  4. HTN Stable No change required today  Chanetta Marshall, NP 03/07/2015 7:24 AM  I have seen and examined this patient with Chanetta Marshall.  Agree with above, note added to reflect my findings.  On exam, irregular, no murmurs or  wheezing.  Has atrial fibrillation that is persistent and has failed multiple drugs including amiodarone and sotalol.  Plan for tikosyn loading at 250 mg BID.  Graciella Arment continue warfarin and Antonique Langford cardiovert if necessary over the weekend.    Jessyca Sloan M. Kyrstan Gotwalt MD 03/07/2015 7:34 AM

## 2015-03-07 NOTE — Progress Notes (Signed)
Utilization review completed.  

## 2015-03-07 NOTE — Progress Notes (Addendum)
Pt admitted for Tikosyn load.  Tikosyn scheduled for 6PM.  Tikosyn was pushed to 7PM per MAR.  When this RN arrived, EKG performed.  Pt also having runs of Vtach, nonsustained.  Pt asymptomatic, VSS.   EKG showed QTc 505, QRS 194.  Per parameters, MD Claiborne Billings notifed of EKG/VT before administering dose.  Kelly did not feel comfortable administering this dose.  MD Caryl Comes paged, NP Amber paged with no return call.  Also talked to pharmacy and was advised to hold medication.  Will hold PM dose of tikosyn.

## 2015-03-07 NOTE — Progress Notes (Signed)
Patient Name: [name]      SUBJECTIVE: pt with some SOB last pm responsive to IV lasix   Past Medical History  Diagnosis Date  . CAD (coronary artery disease) 1999    Remote anterolater and apical MI, cath 2011 patent stents RCA/LCx  LAD w/o obstruction  . Cardiomyopathy, ischemic     EF 20-25%  echo 2012  . Chronic systolic congestive heart failure ([place])   . 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 Abdominal aortic aneurysm ([place]) 2009    Repaired with endovascular stent  . PVD (peripheral vascular disease) ([place])     Has occluded innominate vein  . Myocardial infarction ([place])   . Hypertension   . Ventricular tachycardia (HCC)     recurrent slow VT at 100-110 bpm,  Rx mexilitene/sotalol  . Left bundle branch block   . Arthritis   . Gynecomastia     2/2 spironolactone  . Persistent atrial fibrillation ([place])   . Pericarditis     due to perforation  . Inguinal hernia recurrent bilateral   . GERD (gastroesophageal reflux disease)   . Anemia 05/2014  . Cardiac arrest ([place]) 12/05/2013  . SVT (supraventricular tachycardia) ([place]) 12/05/2013    Scheduled Meds:  Scheduled Meds: . ALPRAZolam  0.25 mg Oral Q4H while awake  . carvedilol  12.5 mg Oral BID  . docusate sodium  100 mg Oral Q1400  . dofetilide  250 mcg Oral BID  . famotidine  20 mg Oral QHS  . furosemide  20 mg Oral BID  . levothyroxine  88 mcg Oral QAC breakfast  . loratadine  10 mg Oral Q1400  . losartan  25 mg Oral Q0600  . mexiletine  150 mg Oral TID  . multivitamin with minerals  1 tablet Oral Q1400  . sodium chloride  3 mL Intravenous Q12H  . vitamin C  500 mg Oral Q1400  . [START ON 03/08/2015] warfarin  2.5 mg Oral Once per day on Tue Sat  . [START ON 03/08/2015] warfarin  2.5 mg Oral Once per day on Tue Sat   And  . warfarin  3.75 mg Oral Once per day on Sun Mon Wed Thu Fri  . Warfarin - Pharmacist Dosing Inpatient   Does not apply q1800   Continuous Infusions:  sodium chloride,  acetaminophen, levalbuterol, sodium chloride    PHYSICAL EXAM Filed Vitals:   03/06/15 1533 03/06/15 2037 03/06/15 2355 03/07/15 0351  BP: 122/64 130/60  117/73  Pulse: 65 63 72   Temp: 97.6 F (36.4 C) 98.3 F (36.8 C)  97.8 F (36.6 C)  TempSrc: Oral Oral  Oral  Resp: 18 12 16 16   Height: 5\' 8"  (1.727 m)     SpO2: 100% 96%  95%   Well developed and nourished in no acute distress HENT normal Neck supple with JVP-7 Clear Regular rate and rhythm, no murmurs or gallops Abd-soft with active BS No Clubbing cyanosis edema Skin-warm and dry A & Oriented  Grossly normal sensory and motor function   TELEMETRY: Reviewed telemetry pt in Afib with V pacin:    Intake/Output Summary (Last 24 hours) at 03/07/15 1059 Last data filed at 03/07/15 8099  Gross per 24 hour  Intake      0 ml  Output    775 ml  Net   -775 ml    LABS: Basic Metabolic Panel:  Recent Labs Lab 03/05/15 1029 03/06/15 1731 03/07/15 0400  NA 136 135 136  K 4.5 4.6 4.0  CL 100 98* 98*  CO2 28 29 26   GLUCOSE 103* 109* 133*  BUN 19 14 13   CREATININE 1.47* 1.33* 1.35*  CALCIUM 9.2 9.6 9.4  MG 2.3 2.3 2.2   Cardiac Enzymes: No results for input(s): CKTOTAL, CKMB, CKMBINDEX, TROPONINI in the last 72 hours. CBC: No results for input(s): WBC, NEUTROABS, HGB, HCT, MCV, PLT in the last 168 hours. PROTIME:  Recent Labs  03/06/15 1731 03/07/15 0400  LABPROT 29.3* 28.9*  INR 2.83* 2.78*   Liver Function Tests: No results for input(s): AST, ALT, ALKPHOS, BILITOT, PROT, ALBUMIN in the last 72 hours. No results for input(s): LIPASE, AMYLASE in the last 72 hours. BNP: BNP (last 3 results)  Recent Labs  06/15/14 0139 06/29/14 1038 01/18/15 0740  BNP 1001.8* 1486.2* 682.3*        ASSESSMENT AND PLAN:  Active Problems:   Cardiomyopathy, ischemic   Biventricular implantable cardioverter-defibrillator in situ   Persistent atrial fibrillation ([place])   Acute on chronic systolic heart failure,  NYHA class 3 ([place]) will give another dose of IV lasix this am Change oral dose to 40 daily anticipate DCCV on Sunday if not converted spontaneously    Signed, Virl Axe MD  03/07/2015

## 2015-03-07 NOTE — Progress Notes (Signed)
Pt reports he is wheezing and unsure if he took his 2pm dose of lasix today.  MD Claiborne Billings notified and ordered 40mg  IV lasix, xopenex.  Will cont to monitor pt closely.

## 2015-03-07 NOTE — Progress Notes (Signed)
[facility] for Warfarin Indication: atrial fibrillation  Allergies  Allergen Reactions  . Iohexol Hives and Itching     Code: HIVES, Desc: hives w/ itching during cardiac cath '99, mult caths since w/ ? premeds, DR.G.HAYES REQUESTS 13 HR PRE MED//A.C.pt okay w/13 hr prep/mms, Onset Date: [id number]   . Prednisone Hives    Patient Measurements: Height: 5\' 8"  (172.7 cm) IBW/kg (Calculated) : 68.4 Vital Signs: Temp: 97.8 F (36.6 C) (10/07 0351) Temp Source: Oral (10/07 0351) BP: 117/73 mmHg (10/07 0351) Pulse Rate: 72 (10/06 2355) Labs:  Recent Labs  03/05/15 1029 03/06/15 1731 03/07/15 0400  LABPROT  --  29.3* 28.9*  INR  --  2.83* 2.78*  CREATININE 1.47* 1.33* 1.35*    Estimated Creatinine Clearance: 47.1 mL/min (by C-G formula based on Cr of 1.35).   Medical History: Past Medical History  Diagnosis Date  . CAD (coronary artery disease) 1999    Remote anterolater and apical MI, cath 2011 patent stents RCA/LCx  LAD w/o obstruction  . Cardiomyopathy, ischemic     EF 20-25%  echo 2012  . Chronic systolic congestive heart failure ([place])   . 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 Abdominal aortic aneurysm ([place]) 2009    Repaired with endovascular stent  . PVD (peripheral vascular disease) ([place])     Has occluded innominate vein  . Myocardial infarction ([place])   . Hypertension   . Ventricular tachycardia (HCC)     recurrent slow VT at 100-110 bpm,  Rx mexilitene/sotalol  . Left bundle branch block   . Arthritis   . Gynecomastia     2/2 spironolactone  . Persistent atrial fibrillation ([place])   . Pericarditis     due to perforation  . Inguinal hernia recurrent bilateral   . GERD (gastroesophageal reflux disease)   . Anemia 05/2014  . Cardiac arrest ([place]) 12/05/2013  . SVT (supraventricular tachycardia) ([place]) 12/05/2013   Assessment: 74 year old male admitted for Tikosyn initiation with persistent atrial fibrillation to continue  warfarin therapy for anticoagulation. CHADSVASC= 5. Last dose of warfarin pta 03/05/15. Home regimen is 2.5mg  (1 tablet) on Tuesdays and Saturdays and 3.75mg  (1 1/2 tablets) all other days. CBC, LFTs, and albumin are within normal limits as of 02/06/15. INR this PM is therapeutic at 2.83>>2.78. No bleed documented  Goal of Therapy:  INR 2-3 Monitor platelets by anticoagulation protocol: Yes   Plan:  Continue home warfarin dose of 2.5mg  on Tuesday and Saturday and 3.75mg  all other days.  Daily PT/INR.  Monitor for signs and symptoms of bleeding.    Elicia Lamp, PharmD Clinical Pharmacist Pager [phone] 03/07/2015 8:36 AM

## 2015-03-07 NOTE — Care Management Note (Addendum)
Case Management Note  Patient Details  Name: [name] MRN: [id number] Date of Birth: 1940-07-04  Subjective/Objective:     Pt admitted with persistent atrial fib               Action/Plan:  Pt is independent from home.  Pt will discharge home on [place], CM will assist with post discharge Tikosyn.   Expected Discharge Date:                  Expected Discharge Plan:  Home/Self Care  In-House Referral:     Discharge planning Services  CM Consult, Medication Assistance  Post Acute Care Choice:    Choice offered to:     DME Arranged:    DME Agency:     HH Arranged:    HH Agency:     Status of Service:  In process, will continue to follow  Medicare Important Message Given:    Date Medicare IM Given:    Medicare IM give by:    Date Additional Medicare IM Given:    Additional Medicare Important Message give by:     If discussed at [place] of Stay Meetings, dates discussed:    Additional Comments: CM submitted benefit check.  Pt stated [facility] on [address], [place] contacted pharmacy and was informed that drug could be ordered and delivered within 3 days, pharmacy requesting pt to bring prescription to pharmacy immediately post discharge pt will be discharged home with 7 day supply.  CM communicated this with pt.  Benefit check yielded; $40 for brand name and $10 for generic, CM communicated this to pt and placed Physician Sticky note with cost.  Maryclare Labrador, RN 03/07/2015, 3:22 PM

## 2015-03-08 ENCOUNTER — Inpatient Hospital Stay (HOSPITAL_COMMUNITY): Payer: Medicare Other

## 2015-03-08 DIAGNOSIS — I4892 Unspecified atrial flutter: Secondary | ICD-10-CM

## 2015-03-08 LAB — BASIC METABOLIC PANEL
Anion gap: 11 (ref 5–15)
BUN: 17 mg/dL (ref 6–20)
CALCIUM: 9.5 mg/dL (ref 8.9–10.3)
CHLORIDE: 102 mmol/L (ref 101–111)
CO2: 27 mmol/L (ref 22–32)
CREATININE: 1.49 mg/dL — AB (ref 0.61–1.24)
GFR, EST AFRICAN AMERICAN: 52 mL/min — AB (ref >60)
GFR, EST NON AFRICAN AMERICAN: 45 mL/min — AB (ref >60)
Glucose, Bld: 116 mg/dL — ABNORMAL HIGH (ref 65–99)
Potassium: 3.9 mmol/L (ref 3.5–5.1)
SODIUM: 140 mmol/L (ref 135–145)

## 2015-03-08 LAB — MAGNESIUM: MAGNESIUM: 2.1 mg/dL (ref 1.7–2.4)

## 2015-03-08 LAB — PROTIME-INR
INR: 2.79 — AB (ref 0.00–1.49)
PROTHROMBIN TIME: 29 s — AB (ref 11.6–15.2)

## 2015-03-08 MED ORDER — PERFLUTREN LIPID MICROSPHERE
1.0000 mL | INTRAVENOUS | Status: AC | PRN
  Administered 2015-03-08: 4 mL via INTRAVENOUS
  Filled 2015-03-08: qty 10

## 2015-03-08 NOTE — Progress Notes (Addendum)
Patient Name: [name]      SUBJECTIVE: without complaints and tolerating dofetilide well   Past Medical History  Diagnosis Date  . CAD (coronary artery disease) 1999    Remote anterolater and apical MI, cath 2011 patent stents RCA/LCx  LAD w/o obstruction  . Cardiomyopathy, ischemic     EF 20-25%  echo 2012  . Chronic systolic congestive heart failure ([place])   . 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 Abdominal aortic aneurysm ([place]) 2009    Repaired with endovascular stent  . PVD (peripheral vascular disease) ([place])     Has occluded innominate vein  . Myocardial infarction ([place])   . Hypertension   . Ventricular tachycardia (HCC)     recurrent slow VT at 100-110 bpm,  Rx mexilitene/sotalol  . Left bundle branch block   . Arthritis   . Gynecomastia     2/2 spironolactone  . Persistent atrial fibrillation ([place])   . Pericarditis     due to perforation  . Inguinal hernia recurrent bilateral   . GERD (gastroesophageal reflux disease)   . Anemia 05/2014  . Cardiac arrest ([place]) 12/05/2013  . SVT (supraventricular tachycardia) ([place]) 12/05/2013    Scheduled Meds:  Scheduled Meds: . ALPRAZolam  0.25 mg Oral Q4H while awake  . carvedilol  12.5 mg Oral BID  . docusate sodium  100 mg Oral Q1400  . dofetilide  250 mcg Oral BID  . famotidine  20 mg Oral QHS  . furosemide  40 mg Oral Daily  . levothyroxine  88 mcg Oral QAC breakfast  . loratadine  10 mg Oral Q1400  . losartan  25 mg Oral Q0600  . mexiletine  150 mg Oral TID  . multivitamin with minerals  1 tablet Oral Q1400  . sodium chloride  3 mL Intravenous Q12H  . vitamin C  500 mg Oral Q1400  . warfarin  2.5 mg Oral Once per day on Tue Sat  . warfarin  2.5 mg Oral Once per day on Tue Sat   And  . warfarin  3.75 mg Oral Once per day on Sun Mon Wed Thu Fri  . Warfarin - Pharmacist Dosing Inpatient   Does not apply q1800   Continuous Infusions:  sodium chloride, acetaminophen, levalbuterol, sodium  chloride    PHYSICAL EXAM Filed Vitals:   03/07/15 1845 03/07/15 2006 03/08/15 0412 03/08/15 0839  BP: 116/68 101/68 116/74 109/54  Pulse: 80 69 107 65  Temp:  97.5 F (36.4 C) 97.8 F (36.6 C) 97.6 F (36.4 C)  TempSrc:  Oral Oral Oral  Resp:  16 18 18   Height:      SpO2:  97% 98% 100%    Well developed and nourished in no acute distress HENT normal Neck supple with JVP-flat Clear Regular rate and rhythm, no murmurs or gallops Abd-soft with active BS No Clubbing cyanosis edema Skin-warm and dry A & Oriented  Grossly normal sensory and motor function  TELEMETRY: Reviewed telemetry pt in atach and flutter   Intake/Output Summary (Last 24 hours) at 03/08/15 1213 Last data filed at 03/08/15 0413  Gross per 24 hour  Intake    240 ml  Output   1400 ml  Net  -1160 ml    LABS: Basic Metabolic Panel:  Recent Labs Lab 03/05/15 1029 03/06/15 1731 03/07/15 0400 03/08/15 0400  NA 136 135 136 140  K 4.5 4.6 4.0 3.9  CL 100 98* 98* 102  CO2 28 29 26 27   GLUCOSE 103* 109* 133* 116*  BUN 19 14 13 17   CREATININE 1.47* 1.33* 1.35* 1.49*  CALCIUM 9.2 9.6 9.4 9.5  MG 2.3 2.3 2.2 2.1   Cardiac Enzymes: No results for input(s): CKTOTAL, CKMB, CKMBINDEX, TROPONINI in the last 72 hours. CBC: No results for input(s): WBC, NEUTROABS, HGB, HCT, MCV, PLT in the last 168 hours. PROTIME:  Recent Labs  03/06/15 1731 03/07/15 0400 03/08/15 0400  LABPROT 29.3* 28.9* 29.0*  INR 2.83* 2.78* 2.79*   Liver Function Tests: No results for input(s): AST, ALT, ALKPHOS, BILITOT, PROT, ALBUMIN in the last 72 hours. No results for input(s): LIPASE, AMYLASE in the last 72 hours. BNP: BNP (last 3 results)  Recent Labs  06/15/14 0139 06/29/14 1038 01/18/15 0740  BNP 1001.8* 1486.2* 682.3*    ProBNP (last 3 results) No results for input(s): PROBNP in the last 8760 hours.  D-Dimer: No results for input(s): DDIMER in the last 72 hours. Hemoglobin A1C: No results for  input(s): HGBA1C in the last 72 hours. Fasting Lipid Panel: No results for input(s): CHOL, HDL, LDLCALC, TRIG, CHOLHDL, LDLDIRECT in the last 72 hours. Thyroid Function Tests: No results for input(s): TSH, T4TOTAL, T3FREE, THYROIDAB in the last 72 hours.  Invalid input(s): FREET3 Anemia Panel: No results for input(s): VITAMINB12, FOLATE, FERRITIN, TIBC, IRON, RETICCTPCT in the last 72 hours.   Still out of rhythm   ASSESSMENT AND PLAN:  Active Problems:   Cardiomyopathy, ischemic   Biventricular implantable cardioverter-defibrillator in situ   Persistent atrial fibrillation (HCC)   Acute on chronic systolic heart failure, NYHA class 3 ([place])  Tolerating drug well DCCV in am if not converted   Signed, Virl Axe MD  03/08/2015   Qt okay in context of QRS of200 msec

## 2015-03-08 NOTE — Progress Notes (Signed)
[facility] for Warfarin Indication: atrial fibrillation  Allergies  Allergen Reactions  . Iohexol Hives and Itching     Code: HIVES, Desc: hives w/ itching during cardiac cath '99, mult caths since w/ ? premeds, DR.G.HAYES REQUESTS 13 HR PRE MED//A.C.pt okay w/13 hr prep/mms, Onset Date: [id number]   . Prednisone Hives    Patient Measurements: Height: 5\' 8"  (172.7 cm) IBW/kg (Calculated) : 68.4 Vital Signs: Temp: 97.6 F (36.4 C) (10/08 0839) Temp Source: Oral (10/08 0839) BP: 109/54 mmHg (10/08 0839) Pulse Rate: 65 (10/08 0839) Labs:  Recent Labs  03/06/15 1731 03/07/15 0400 03/08/15 0400  LABPROT 29.3* 28.9* 29.0*  INR 2.83* 2.78* 2.79*  CREATININE 1.33* 1.35* 1.49*    Estimated Creatinine Clearance: 42.7 mL/min (by C-G formula based on Cr of 1.49).   Medical History: Past Medical History  Diagnosis Date  . CAD (coronary artery disease) 1999    Remote anterolater and apical MI, cath 2011 patent stents RCA/LCx  LAD w/o obstruction  . Cardiomyopathy, ischemic     EF 20-25%  echo 2012  . Chronic systolic congestive heart failure ([place])   . 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 Abdominal aortic aneurysm ([place]) 2009    Repaired with endovascular stent  . PVD (peripheral vascular disease) ([place])     Has occluded innominate vein  . Myocardial infarction ([place])   . Hypertension   . Ventricular tachycardia (HCC)     recurrent slow VT at 100-110 bpm,  Rx mexilitene/sotalol  . Left bundle branch block   . Arthritis   . Gynecomastia     2/2 spironolactone  . Persistent atrial fibrillation ([place])   . Pericarditis     due to perforation  . Inguinal hernia recurrent bilateral   . GERD (gastroesophageal reflux disease)   . Anemia 05/2014  . Cardiac arrest ([place]) 12/05/2013  . SVT (supraventricular tachycardia) ([place]) 12/05/2013   Assessment: 74 year old male admitted for Tikosyn initiation with persistent atrial fibrillation to continue  warfarin therapy for anticoagulation. CHADSVASC= 5. Last dose of warfarin pta 03/05/15. Home regimen is 2.5mg  (1 tablet) on Tuesdays and Saturdays and 3.75mg  (1 1/2 tablets) all other days. CBC, LFTs, and albumin are within normal limits as of 02/06/15. INR this AM remains therapeutic at 2.79. No bleed documented  Goal of Therapy:  INR 2-3 Monitor platelets by anticoagulation protocol: Yes   Plan:  Continue home warfarin dose of 2.5mg  on Tuesday and Saturday and 3.75mg  all other days.  Daily PT/INR.  Monitor for signs and symptoms of bleeding.  Consider ordering a small KCl dose since K < 4 and patient is on Tikosyn.    Albertina Parr, PharmD., BCPS Clinical Pharmacist Phone [phone]

## 2015-03-08 NOTE — Progress Notes (Signed)
  Echocardiogram 2D Echocardiogram has been performed.  [name] 03/08/2015, 3:46 PM

## 2015-03-08 NOTE — Progress Notes (Deleted)
*  PRELIMINARY RESULTS* Echocardiogram 2D Echocardiogram has been performed.  [name] 03/08/2015, 3:46 PM

## 2015-03-09 ENCOUNTER — Encounter (HOSPITAL_COMMUNITY)
Admission: AD | Disposition: A | Discharge status: Home / Self Care | Payer: Self-pay | Source: Ambulatory Visit | Attending: Internal Medicine

## 2015-03-09 ENCOUNTER — Inpatient Hospital Stay (HOSPITAL_COMMUNITY): Payer: Medicare Other | Admitting: Anesthesiology

## 2015-03-09 HISTORY — PX: CARDIOVERSION: SHX1299

## 2015-03-09 LAB — BASIC METABOLIC PANEL
ANION GAP: 9 (ref 5–15)
BUN: 18 mg/dL (ref 6–20)
CHLORIDE: 98 mmol/L — AB (ref 101–111)
CO2: 30 mmol/L (ref 22–32)
Calcium: 9.3 mg/dL (ref 8.9–10.3)
Creatinine, Ser: 1.34 mg/dL — ABNORMAL HIGH (ref 0.61–1.24)
GFR calc non Af Amer: 51 mL/min — ABNORMAL LOW (ref >60)
GFR, EST AFRICAN AMERICAN: 59 mL/min — AB (ref >60)
GLUCOSE: 110 mg/dL — AB (ref 65–99)
POTASSIUM: 3.8 mmol/L (ref 3.5–5.1)
Sodium: 137 mmol/L (ref 135–145)

## 2015-03-09 LAB — PROTIME-INR
INR: 3 — AB (ref 0.00–1.49)
Prothrombin Time: 30.6 seconds — ABNORMAL HIGH (ref 11.6–15.2)

## 2015-03-09 LAB — MAGNESIUM: Magnesium: 2.1 mg/dL (ref 1.7–2.4)

## 2015-03-09 SURGERY — CARDIOVERSION
Anesthesia: General

## 2015-03-09 MED ORDER — FENTANYL CITRATE (PF) 100 MCG/2ML IJ SOLN: 25.0000 ug | INTRAMUSCULAR | Status: DC | PRN

## 2015-03-09 MED ORDER — DEXTROSE-NACL 5-0.45 % IV SOLN
INTRAVENOUS | Status: DC | PRN
  Administered 2015-03-09: 09:00:00 via INTRAVENOUS

## 2015-03-09 MED ORDER — PROPOFOL 10 MG/ML IV BOLUS
INTRAVENOUS | Status: DC | PRN
  Administered 2015-03-09: 60 mg via INTRAVENOUS

## 2015-03-09 MED ORDER — MEPERIDINE HCL 25 MG/ML IJ SOLN: 6.2500 mg | INTRAMUSCULAR | Status: DC | PRN

## 2015-03-09 MED ORDER — POTASSIUM CHLORIDE CRYS ER 10 MEQ PO TBCR
10.0000 meq | EXTENDED_RELEASE_TABLET | Freq: Two times a day (BID) | ORAL | Status: DC
  Administered 2015-03-09 (×2): 10 meq via ORAL
  Filled 2015-03-09 (×3): qty 1

## 2015-03-09 NOTE — Progress Notes (Signed)
Patient Name: [name]      SUBJECTIVE: without complaints and tolerating dofetilide well   Past Medical History  Diagnosis Date  . CAD (coronary artery disease) 1999    Remote anterolater and apical MI, cath 2011 patent stents RCA/LCx  LAD w/o obstruction  . Cardiomyopathy, ischemic     EF 20-25%  echo 2012  . Chronic systolic congestive heart failure ([place])   . 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 Abdominal aortic aneurysm ([place]) 2009    Repaired with endovascular stent  . PVD (peripheral vascular disease) ([place])     Has occluded innominate vein  . Myocardial infarction ([place])   . Hypertension   . Ventricular tachycardia (HCC)     recurrent slow VT at 100-110 bpm,  Rx mexilitene/sotalol  . Left bundle branch block   . Arthritis   . Gynecomastia     2/2 spironolactone  . Persistent atrial fibrillation ([place])   . Pericarditis     due to perforation  . Inguinal hernia recurrent bilateral   . GERD (gastroesophageal reflux disease)   . Anemia 05/2014  . Cardiac arrest ([place]) 12/05/2013  . SVT (supraventricular tachycardia) ([place]) 12/05/2013    Scheduled Meds:  Scheduled Meds: . ALPRAZolam  0.25 mg Oral Q4H while awake  . carvedilol  12.5 mg Oral BID  . docusate sodium  100 mg Oral Q1400  . dofetilide  250 mcg Oral BID  . famotidine  20 mg Oral QHS  . furosemide  40 mg Oral Daily  . levothyroxine  88 mcg Oral QAC breakfast  . loratadine  10 mg Oral Q1400  . losartan  25 mg Oral Q0600  . mexiletine  150 mg Oral TID  . multivitamin with minerals  1 tablet Oral Q1400  . sodium chloride  3 mL Intravenous Q12H  . vitamin C  500 mg Oral Q1400  . warfarin  2.5 mg Oral Once per day on Tue Sat   And  . warfarin  3.75 mg Oral Once per day on Sun Mon Wed Thu Fri  . Warfarin - Pharmacist Dosing Inpatient   Does not apply q1800   Continuous Infusions:  sodium chloride, acetaminophen, levalbuterol, sodium chloride    PHYSICAL EXAM Filed Vitals:   03/08/15  1435 03/08/15 1815 03/08/15 2055 03/09/15 0550  BP: 99/59 110/63 113/66 122/58  Pulse: 73 65 75 71  Temp: 97.7 F (36.5 C)  98.3 F (36.8 C) 98.2 F (36.8 C)  TempSrc: Oral  Oral Oral  Resp: 20  18 18   Height:      SpO2: 100%  98% 99%    Well developed and nourished in no acute distress HENT normal Neck supple with JVP-flat Clear Regular rate and rhythm, no murmurs or gallops Abd-soft with active BS No Clubbing cyanosis edema Skin-warm and dry A & Oriented  Grossly normal sensory and motor function  TELEMETRY: Reviewed telemetry pt in atach and flutter   Intake/Output Summary (Last 24 hours) at 03/09/15 0757 Last data filed at 03/08/15 2142  Gross per 24 hour  Intake    483 ml  Output      0 ml  Net    483 ml    LABS: Basic Metabolic Panel:  Recent Labs Lab 03/05/15 1029 03/06/15 1731 03/07/15 0400 03/08/15 0400 03/09/15 0344  NA 136 135 136 140 137  K 4.5 4.6 4.0 3.9 3.8  CL 100 98* 98* 102 98*  CO2 28  29 26 27 30   GLUCOSE 103* 109* 133* 116* 110*  BUN 19 14 13 17 18   CREATININE 1.47* 1.33* 1.35* 1.49* 1.34*  CALCIUM 9.2 9.6 9.4 9.5 9.3  MG 2.3 2.3 2.2 2.1 2.1   Cardiac Enzymes: No results for input(s): CKTOTAL, CKMB, CKMBINDEX, TROPONINI in the last 72 hours. CBC: No results for input(s): WBC, NEUTROABS, HGB, HCT, MCV, PLT in the last 168 hours. PROTIME:  Recent Labs  03/07/15 0400 03/08/15 0400 03/09/15 0344  LABPROT 28.9* 29.0* 30.6*  INR 2.78* 2.79* 3.00*   Liver Function Tests: No results for input(s): AST, ALT, ALKPHOS, BILITOT, PROT, ALBUMIN in the last 72 hours. No results for input(s): LIPASE, AMYLASE in the last 72 hours. BNP: BNP (last 3 results)  Recent Labs  06/15/14 0139 06/29/14 1038 01/18/15 0740  BNP 1001.8* 1486.2* 682.3*    ProBNP (last 3 results) No results for input(s): PROBNP in the last 8760 hours.  D-Dimer: No results for input(s): DDIMER in the last 72 hours. Hemoglobin A1C: No results for input(s):  HGBA1C in the last 72 hours. Fasting Lipid Panel: No results for input(s): CHOL, HDL, LDLCALC, TRIG, CHOLHDL, LDLDIRECT in the last 72 hours. Thyroid Function Tests: No results for input(s): TSH, T4TOTAL, T3FREE, THYROIDAB in the last 72 hours.  Invalid input(s): FREET3 Anemia Panel: No results for input(s): VITAMINB12, FOLATE, FERRITIN, TIBC, IRON, RETICCTPCT in the last 72 hours.   Still out of rhythm   ASSESSMENT AND PLAN:  Active Problems:   Cardiomyopathy, ischemic   Biventricular implantable cardioverter-defibrillator in situ   Persistent atrial fibrillation (HCC)   Acute on chronic systolic heart failure, NYHA class 3 (HCC)  Needs Potassium repletion For cardioversion this am   Signed, Virl Axe MD  03/09/2015

## 2015-03-09 NOTE — Anesthesia Postprocedure Evaluation (Signed)
  Anesthesia Post-op Note  Patient: [name]  Procedure(s) Performed: Procedure(s): CARDIOVERSION (N/A)  Patient Location: PACU  Anesthesia Type: General   Level of Consciousness: awake, alert  and oriented  Airway and Oxygen Therapy: Patient Spontanous Breathing  Post-op Pain: mild  Post-op Assessment: Post-op Vital signs reviewed  Post-op Vital Signs: Reviewed  Last Vitals:  Filed Vitals:   03/09/15 0550  BP: 122/58  Pulse: 71  Temp: 36.8 C  Resp: 18    Complications: No apparent anesthesia complications

## 2015-03-09 NOTE — Procedures (Signed)
Preop Dx aflutter Post op DX  NSR   Procedure  DC Cardioversion   Pt was sedated by anesthesia receiving 60 mg Propafol  A synchronized shock 100 joules restored sinus Rhythm   Pt tolerated without difficulty

## 2015-03-09 NOTE — Discharge Instructions (Signed)

## 2015-03-09 NOTE — Progress Notes (Signed)
[facility] for Warfarin Indication: atrial fibrillation  Allergies  Allergen Reactions  . Iohexol Hives and Itching     Code: HIVES, Desc: hives w/ itching during cardiac cath '99, mult caths since w/ ? premeds, DR.G.HAYES REQUESTS 13 HR PRE MED//A.C.pt okay w/13 hr prep/mms, Onset Date: [id number]   . Prednisone Hives    Patient Measurements: Height: 5\' 8"  (172.7 cm) IBW/kg (Calculated) : 68.4 Vital Signs: Temp: 97.6 F (36.4 C) (10/09 0900) Temp Source: Oral (10/09 0550) BP: 122/66 mmHg (10/09 0916) Pulse Rate: 71 (10/09 0930) Labs:  Recent Labs  03/07/15 0400 03/08/15 0400 03/09/15 0344  LABPROT 28.9* 29.0* 30.6*  INR 2.78* 2.79* 3.00*  CREATININE 1.35* 1.49* 1.34*    Estimated Creatinine Clearance: 47.5 mL/min (by C-G formula based on Cr of 1.34).   Medical History: Past Medical History  Diagnosis Date  . CAD (coronary artery disease) 1999    Remote anterolater and apical MI, cath 2011 patent stents RCA/LCx  LAD w/o obstruction  . Cardiomyopathy, ischemic     EF 20-25%  echo 2012  . Chronic systolic congestive heart failure ([place])   . 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 Abdominal aortic aneurysm ([place]) 2009    Repaired with endovascular stent  . PVD (peripheral vascular disease) ([place])     Has occluded innominate vein  . Myocardial infarction ([place])   . Hypertension   . Ventricular tachycardia (HCC)     recurrent slow VT at 100-110 bpm,  Rx mexilitene/sotalol  . Left bundle branch block   . Arthritis   . Gynecomastia     2/2 spironolactone  . Persistent atrial fibrillation ([place])   . Pericarditis     due to perforation  . Inguinal hernia recurrent bilateral   . GERD (gastroesophageal reflux disease)   . Anemia 05/2014  . Cardiac arrest ([place]) 12/05/2013  . SVT (supraventricular tachycardia) ([place]) 12/05/2013   Assessment: 74 year old male admitted for Tikosyn initiation with persistent atrial fibrillation to continue  warfarin therapy for anticoagulation. CHADSVASC= 5. Home regimen is 2.5mg  (1 tablet) on Tuesdays and Saturdays and 3.75mg  (1 1/2 tablets) all other days. CBC, LFTs, and albumin are within normal limits as of 02/06/15. INR this AM remains therapeutic at 3. S/p cardioversion today and now in NSR. No bleed documented  Goal of Therapy:  INR 2-3 Monitor platelets by anticoagulation protocol: Yes   Plan:  Continue home warfarin dose of 2.5mg  on Tuesday and Saturday and 3.75mg  all other days.  Daily PT/INR.  Monitor for signs and symptoms of bleeding.    Albertina Parr, PharmD., BCPS Clinical Pharmacist Phone [phone]

## 2015-03-09 NOTE — Anesthesia Postprocedure Evaluation (Signed)
  Anesthesia Post-op Note  Patient: [name]  Procedure(s) Performed: Procedure(s): CARDIOVERSION (N/A)  Patient Location: PACU  Anesthesia Type:General  Level of Consciousness: awake  Airway and Oxygen Therapy: Patient Spontanous Breathing  Post-op Pain: none  Post-op Assessment: Post-op Vital signs reviewed and Patient's Cardiovascular Status Stable              Post-op Vital Signs: Reviewed and stable  Last Vitals:  Filed Vitals:   03/09/15 0550  BP: 122/58  Pulse: 71  Temp: 36.8 C  Resp: 18    Complications: No apparent anesthesia complications

## 2015-03-09 NOTE — Anesthesia Preprocedure Evaluation (Addendum)
Anesthesia Evaluation  Patient identified by MRN, date of birth, ID band Patient awake    Reviewed: Allergy & Precautions, NPO status , Patient's Chart, lab work & pertinent test results  Airway Mallampati: I  TM Distance: >3 FB Neck ROM: Full    Dental  (+) Teeth Intact, Dental Advisory Given   Pulmonary former smoker,    breath sounds clear to auscultation       Cardiovascular hypertension, Pt. on medications + CAD, + Past MI, + Peripheral Vascular Disease and +CHF  + dysrhythmias Atrial Fibrillation  Rhythm:Regular Rate:Normal     Neuro/Psych    GI/Hepatic GERD  Medicated and Controlled,  Endo/Other  Hyperthyroidism   Renal/GU      Musculoskeletal   Abdominal   Peds  Hematology   Anesthesia Other Findings   Reproductive/Obstetrics                            Anesthesia Physical Anesthesia Plan  ASA: IV  Anesthesia Plan: General   Post-op Pain Management:    Induction: Intravenous  Airway Management Planned: Mask  Additional Equipment:   Intra-op Plan:   Post-operative Plan: Extubation in OR  Informed Consent: I have reviewed the patients History and Physical, chart, labs and discussed the procedure including the risks, benefits and alternatives for the proposed anesthesia with the patient or authorized representative who has indicated his/her understanding and acceptance.   Dental advisory given  Plan Discussed with: CRNA, Anesthesiologist and Surgeon  Anesthesia Plan Comments:         Anesthesia Quick Evaluation

## 2015-03-09 NOTE — Transfer of Care (Signed)
Immediate Anesthesia Transfer of Care Note  Patient: [name]  Procedure(s) Performed: Procedure(s): CARDIOVERSION (N/A)  Patient Location: PACU  Anesthesia Type:General  Level of Consciousness: awake  Airway & Oxygen Therapy: Patient Spontanous Breathing and Patient connected to nasal cannula oxygen  Post-op Assessment: Report given to RN and Post -op Vital signs reviewed and stable  Post vital signs: Reviewed and stable  Last Vitals:  Filed Vitals:   03/09/15 0550  BP: 122/58  Pulse: 71  Temp: 36.8 C  Resp: 18    Complications: No apparent anesthesia complications

## 2015-03-10 ENCOUNTER — Encounter (HOSPITAL_COMMUNITY): Payer: Self-pay | Admitting: Internal Medicine

## 2015-03-10 ENCOUNTER — Telehealth: Payer: Self-pay

## 2015-03-10 LAB — PROTIME-INR
INR: 2.74 — AB (ref 0.00–1.49)
PROTHROMBIN TIME: 28.6 s — AB (ref 11.6–15.2)

## 2015-03-10 MED ORDER — DOFETILIDE 250 MCG PO CAPS: 250.0000 ug | ORAL_CAPSULE | Freq: Two times a day (BID) | ORAL | Status: AC

## 2015-03-10 MED ORDER — POTASSIUM CHLORIDE CRYS ER 20 MEQ PO TBCR: 20.0000 meq | EXTENDED_RELEASE_TABLET | Freq: Every day | ORAL | Status: AC

## 2015-03-10 MED ORDER — POTASSIUM CHLORIDE CRYS ER 20 MEQ PO TBCR
40.0000 meq | EXTENDED_RELEASE_TABLET | Freq: Once | ORAL | Status: AC
  Administered 2015-03-10: 40 meq via ORAL
  Filled 2015-03-10: qty 2

## 2015-03-10 NOTE — Telephone Encounter (Signed)
Telephone call to patient to clarify next home transmission date which is 04/05/15.  Left message for return call.

## 2015-03-10 NOTE — Telephone Encounter (Signed)
Patient returned call.  Explained the appointment on 2015/03/20 is a remote transmission.  I stated my remote appointments are being scheduled under my name but still will be remote.  He stated he was released from the hospital today after being shocked and completed Tikosyn.  He stated right now he is feeling good.  He stated he was given Lasix IV x 2 during the 3 day hospitalization for fluid retention.  Advised to call if he develops any symptoms before the next home transmission on 2015/03/20.

## 2015-03-10 NOTE — Progress Notes (Signed)
Utilization review completed.  

## 2015-03-10 NOTE — Discharge Summary (Signed)
ELECTROPHYSIOLOGY PROCEDURE DISCHARGE SUMMARY    Patient ID: [name],  MRN: [id number], DOB/AGE: 73-Jul-1942 73 y.o.  Admit date: 03/06/2015 Discharge date: 03/10/2015  Primary Care Physician: Sheela Stack, MD  Primary Cardiologist: Dr. Mare Ferrari Electrophysiologist: Dr. Caryl Comes  Primary Discharge Diagnosis:  1.  Persistant atrial fibrillation status post Tikosyn loading this admission He has history of thyrotoxicosis on amiodarone in the past, and persistent atrial fibrillation. He has been maintained on Sotalol and Mexiletine for atrial and ventricular arrhythmias. He has developed recurrent atrial fibrillation and is status post cardioversion late September with ERAF. He is symptomatic with his atrial fibrillation with fatigue and exercise intolerance.  Secondary Discharge Diagnosis:  1. CAD, history of PCI in 2011 2. Ischemic cardiomyopathy, VT, ICD, chronic systolic heart failure  Allergies  Allergen Reactions  . Iohexol Hives and Itching     Code: HIVES, Desc: hives w/ itching during cardiac cath '99, mult caths since w/ ? premeds, DR.G.HAYES REQUESTS 13 HR PRE MED//A.C.pt okay w/13 hr prep/mms, Onset Date: [id number]   . Prednisone Hives     Procedures This Admission:  1.  Tikosyn loading 2.  Direct current cardioversion on 03/09/15 by Dr Caryl Comes which successfully restored to AV pacing rhythm.  There were no early apparent complications.   Brief HPI: [name] is a 74 y.o. male with a past medical history as noted above.  They were referred to EP in the outpatient setting for treatment options of atrial fibrillation.  Risks, benefits, and alternatives to Tikosyn were reviewed with the patient who wished to proceed.    Hospital Course:  The patient was admitted and Tikosyn was initiated.  Renal function and electrolytes were followed during the hospitalization.  His potassium required replacement and will continue with home dose of potassium  as well.  The QTc remained stable.  On 03/09/15 he underwent direct current cardioversion which restored sinus rhythm.  He was monitored until discharge on telemetry which demonstrated AV pacing on the day of discharge.  On the day of discharge, he was examined by Dr Caryl Comes who considered him stable for discharge to home.  Follow-up has been arranged with [facility] in 1 week and with Dr Mare Ferrari in 4 weeks, and Dr. Caryl Comes in 2 months.  Physical Exam: Filed Vitals:   03/09/15 0930 03/09/15 1343 03/09/15 2130 03/10/15 0453  BP:  97/54 119/64 124/66  Pulse: 71 71 68 61  Temp:  97.8 F (36.6 C) 97.8 F (36.6 C) 98.1 F (36.7 C)  TempSrc:  Oral Oral Oral  Resp: 12  16 16   Height:      SpO2: 96% 97% 99% 99%    Labs:   Lab Results  Component Value Date   WBC 9.7 01/19/2015   HGB 13.9 02/18/2015   HCT 41.0 02/18/2015   MCV 95.2 01/19/2015   PLT 211 01/19/2015    Recent Labs Lab 03/09/15 0344  NA 137  K 3.8  CL 98*  CO2 30  BUN 18  CREATININE 1.34*  CALCIUM 9.3  GLUCOSE 110*   03/10/15: INR 2.74 03/09/15: Magnesium 2.1  03/08/15: Echocardiogram Study Conclusions - Left ventricle: The cavity size was normal. Wall thickness was normal. Systolic function was severely reduced. The estimated ejection fraction was in the range of 25% to 30%. Diffuse hypokinesis. - Aortic valve: Mildly calcified annulus. Mildly thickened leaflets. There was mild to moderate regurgitation. Valve area (VTI): 1.88 cm^2. Valve area (Vmax): 1.85 cm^2. - Mitral valve: Mildly calcified  annulus. Normal thickness leaflets . There was mild to moderate regurgitation. - Left atrium: The atrium was severely dilated. - Right ventricle: The cavity size was normal. Systolic function was mildly reduced. RV TAPSE is 1.4 cm. - Right atrium: The atrium was mildly dilated. - Atrial septum: No defect or patent foramen ovale was identified. - Tricuspid valve: There was moderate regurgitation. -  Pulmonary arteries: Systolic pressure was mildly increased. PA peak pressure: 35 mm Hg (S). - Technically difficult study. Echocontrast was used to enhance visualization.   Discharge Medications:    Medication List    TAKE these medications        acetaminophen 500 MG tablet  Commonly known as:  TYLENOL  Take 250 mg by mouth 2 (two) times daily as needed for moderate pain.     ALPRAZolam 0.25 MG tablet  Commonly known as:  XANAX  Take 0.25 mg by mouth every 4 (four) hours.     carvedilol 12.5 MG tablet  Commonly known as:  COREG  Take 1 tablet (12.5 mg total) by mouth 2 (two) times daily.     docusate sodium 100 MG capsule  Commonly known as:  COLACE  Take 100 mg by mouth daily at 2 PM daily at 2 PM.     dofetilide 250 MCG capsule  Commonly known as:  TIKOSYN  Take 1 capsule (250 mcg total) by mouth 2 (two) times daily.     Fish Oil 1000 MG Caps  Take 1,000 mg by mouth 2 (two) times daily. 6am and 6pm     furosemide 20 MG tablet  Commonly known as:  LASIX  Take 20 mg by mouth 2 (two) times daily. Takes at 6AM and 2PM     hydroxypropyl methylcellulose / hypromellose 2.5 % ophthalmic solution  Commonly known as:  ISOPTO TEARS / GONIOVISC  Place 1 drop into both eyes as needed for dry eyes.     levothyroxine 88 MCG tablet  Commonly known as:  SYNTHROID, LEVOTHROID  Take 1 1/2 tablet (132 mcg) on Friday, take 1 tablet (88 mcg) on all other days of the week     loratadine 10 MG tablet  Commonly known as:  CLARITIN  Take 10 mg by mouth daily at 2 PM daily at 2 PM.     losartan 50 MG tablet  Commonly known as:  COZAAR  Take 25 mg by mouth daily.     meclizine 25 MG tablet  Commonly known as:  ANTIVERT  TAKE 1 TABLET (25 MG TOTAL) BY MOUTH 3 (THREE) TIMES DAILY AS NEEDED.     mexiletine 150 MG capsule  Commonly known as:  MEXITIL  Take 150 mg by mouth 3 (three) times daily.     multivitamin with minerals Tabs tablet  Take 1 tablet by mouth daily at 2 PM  daily at 2 PM.     potassium chloride SA 20 MEQ tablet  Commonly known as:  K-DUR,KLOR-CON  Take 1 tablet (20 mEq total) by mouth daily.     ranitidine 150 MG tablet  Commonly known as:  ZANTAC  Take 150 mg by mouth at bedtime.     rosuvastatin 5 MG tablet  Commonly known as:  CRESTOR  Take 5 mg by mouth. Take 1 tablet by mouth two times a week (Tuesdays Saturdays)     vitamin C 500 MG tablet  Commonly known as:  ASCORBIC ACID  Take 500 mg by mouth daily at 2 PM daily at 2 PM.  warfarin 2.5 MG tablet  Commonly known as:  COUMADIN  TAKE AS DIRECTED BY COUMADIN CLINIC        Disposition: Home       Discharge Instructions    Diet - low sodium heart healthy    Complete by:  As directed      Increase activity slowly    Complete by:  As directed           Follow-up Information    Follow up with [facility] On 03/12/2015.   Why:  at 9:45AM for coumadin check   Contact information:   [address] [place] [address]       Follow up with [facility] On 03/17/2015.   Why:  at [employer] information:   [address] [place] [phone]      Follow up with Warren Danes, MD On 04/16/2015.   Specialty:  Cardiology   Why:  at 2:15PM   Contact information:   [address] [place] [address] [phone]       Follow up with Virl Axe, MD On 05/30/2015.   Specialty:  Cardiology   Why:  at 8:45AM   Contact information:   [address] [place] [address] [phone]       Duration of Discharge Encounter: Greater than 30 minutes including physician time.  Venetia Night, PA-C  03/10/2015 4:23 PM  See my note earlier today

## 2015-03-10 NOTE — Progress Notes (Signed)
Patient Name: [name]      SUBJECTIVE: without complaints and tolerating dofetilide well   Past Medical History  Diagnosis Date  . CAD (coronary artery disease) 1999    Remote anterolater and apical MI, cath 2011 patent stents RCA/LCx  LAD w/o obstruction  . Cardiomyopathy, ischemic     EF 20-25%  echo 2012  . Chronic systolic congestive heart failure ([place])   . 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 Abdominal aortic aneurysm ([place]) 2009    Repaired with endovascular stent  . PVD (peripheral vascular disease) ([place])     Has occluded innominate vein  . Myocardial infarction ([place])   . Hypertension   . Ventricular tachycardia (HCC)     recurrent slow VT at 100-110 bpm,  Rx mexilitene/sotalol  . Left bundle branch block   . Arthritis   . Gynecomastia     2/2 spironolactone  . Persistent atrial fibrillation ([place])   . Pericarditis     due to perforation  . Inguinal hernia recurrent bilateral   . GERD (gastroesophageal reflux disease)   . Anemia 05/2014  . Cardiac arrest ([place]) 12/05/2013  . SVT (supraventricular tachycardia) ([place]) 12/05/2013    Scheduled Meds:  Scheduled Meds: . ALPRAZolam  0.25 mg Oral Q4H while awake  . carvedilol  12.5 mg Oral BID  . docusate sodium  100 mg Oral Q1400  . dofetilide  250 mcg Oral BID  . famotidine  20 mg Oral QHS  . furosemide  40 mg Oral Daily  . levothyroxine  88 mcg Oral QAC breakfast  . loratadine  10 mg Oral Q1400  . losartan  25 mg Oral Q0600  . mexiletine  150 mg Oral TID  . multivitamin with minerals  1 tablet Oral Q1400  . potassium chloride  10 mEq Oral BID  . sodium chloride  3 mL Intravenous Q12H  . vitamin C  500 mg Oral Q1400  . warfarin  2.5 mg Oral Once per day on Tue Sat   And  . warfarin  3.75 mg Oral Once per day on Sun Mon Wed Thu Fri  . Warfarin - Pharmacist Dosing Inpatient   Does not apply q1800   Continuous Infusions:  sodium chloride, acetaminophen, fentaNYL (SUBLIMAZE) injection,  levalbuterol, meperidine (DEMEROL) injection, sodium chloride    PHYSICAL EXAM Filed Vitals:   03/09/15 0930 03/09/15 1343 03/09/15 2130 03/10/15 0453  BP:  97/54 119/64 124/66  Pulse: 71 71 68 61  Temp:  97.8 F (36.6 C) 97.8 F (36.6 C) 98.1 F (36.7 C)  TempSrc:  Oral Oral Oral  Resp: 12  16 16   Height:      SpO2: 96% 97% 99% 99%    Well developed and nourished in no acute distress HENT normal Neck supple with JVP-flat Clear Regular rate and rhythm, no murmurs or gallops Abd-soft with active BS No Clubbing cyanosis edema Skin-warm and dry A & Oriented  Grossly normal sensory and motor function  TELEMETRY: Reviewed telemetry pt in AV pacing   Intake/Output Summary (Last 24 hours) at 03/10/15 0934 Last data filed at 03/10/15 0743  Gross per 24 hour  Intake    720 ml  Output      0 ml  Net    720 ml    LABS: Basic Metabolic Panel:  Recent Labs Lab 03/05/15 1029 03/06/15 1731 03/07/15 0400 03/08/15 0400 03/09/15 0344  NA 136 135 136 140 137  K 4.5  4.6 4.0 3.9 3.8  CL 100 98* 98* 102 98*  CO2 28 29 26 27 30   GLUCOSE 103* 109* 133* 116* 110*  BUN 19 14 13 17 18   CREATININE 1.47* 1.33* 1.35* 1.49* 1.34*  CALCIUM 9.2 9.6 9.4 9.5 9.3  MG 2.3 2.3 2.2 2.1 2.1    PROTIME:  Recent Labs  03/08/15 0400 03/09/15 0344 03/10/15 0410  LABPROT 29.0* 30.6* 28.6*  INR 2.79* 3.00* 2.74*   Liver Function Tests: No results for input(s): AST, ALT, ALKPHOS, BILITOT, PROT, ALBUMIN in the last 72 hours. No results for input(s): LIPASE, AMYLASE in the last 72 hours. BNP: BNP (last 3 results)  Recent Labs  06/15/14 0139 06/29/14 1038 01/18/15 0740  BNP 1001.8* 1486.2* 682.3*    ProBNP (last 3 results) No results for input(s): PROBNP in the last 8760 hours.  D-Dimer: No results for input(s): DDIMER in the last 72 hours. Hemoglobin A1C: No results for input(s): HGBA1C in the last 72 hours. Fasting Lipid Panel: No results for input(s): CHOL, HDL, LDLCALC,  TRIG, CHOLHDL, LDLDIRECT in the last 72 hours. Thyroid Function Tests: No results for input(s): TSH, T4TOTAL, T3FREE, THYROIDAB in the last 72 hours.  Invalid input(s): FREET3 Anemia Panel: No results for input(s): VITAMINB12, FOLATE, FERRITIN, TIBC, IRON, RETICCTPCT in the last 72 hours.       ASSESSMENT AND PLAN:  Active Problems:   Cardiomyopathy, ischemic   Biventricular implantable cardioverter-defibrillator in situ   Persistent atrial fibrillation (HCC)   Acute on chronic systolic heart failure, NYHA class 3 ([place])   will continue replete K and discharge home Discharge on Kcl 20 meq daily  Dofetilide protocol followup and  AS in 3 weeks  And otherwise as scheduled   Signed, Virl Axe MD  03/10/2015

## 2015-03-10 NOTE — Care Management Important Message (Signed)
Important Message  Patient Details  Name: [name] MRN: [id number] Date of Birth: 12/04/1940   Medicare Important Message Given:  Yes-second notification given    Nathen May 03/10/2015, 11:28 AM

## 2015-03-10 NOTE — Progress Notes (Addendum)
ANTICOAGULATION CONSULT NOTE - Follow Up Consult  Pharmacy Consult:  Coumadin Indication: atrial fibrillation  Allergies  Allergen Reactions  . Iohexol Hives and Itching     Code: HIVES, Desc: hives w/ itching during cardiac cath '99, mult caths since w/ ? premeds, DR.G.HAYES REQUESTS 13 HR PRE MED//A.C.pt okay w/13 hr prep/mms, Onset Date: [id number]   . Prednisone Hives    Patient Measurements: Height: 5\' 8"  (172.7 cm) IBW/kg (Calculated) : 68.4  Vital Signs: Temp: 98.1 F (36.7 C) (10/10 0453) Temp Source: Oral (10/10 0453) BP: 124/66 mmHg (10/10 0453) Pulse Rate: 61 (10/10 0453)  Labs:  Recent Labs  03/08/15 0400 03/09/15 0344 03/10/15 0410  LABPROT 29.0* 30.6* 28.6*  INR 2.79* 3.00* 2.74*  CREATININE 1.49* 1.34*  --     Estimated Creatinine Clearance: 47.5 mL/min (by C-G formula based on Cr of 1.34).      Assessment: 79 YOM with significant cardiac history admitted for Tikosyn load.  Pharmacy consulted to manage Coumadin from PTA for history of Afib.  INR remains therapeutic; no bleeding reported.   Goal of Therapy:  INR 2-3    Plan:  - Continue Coumadin 3.75mg  daily except 2.5mg  on Tues and Sat - Daily PT / INR - F/U K, Mag, corrected QTc, and renal fxn while on Tikosyn - F/U resume home meds Crestor and Lovaza, rechecking K and Mag    [name], PharmD, BCPS Pager:  [phone] 03/10/2015, 8:22 AM

## 2015-03-12 ENCOUNTER — Encounter: Payer: Self-pay | Admitting: Internal Medicine

## 2015-03-17 ENCOUNTER — Encounter (HOSPITAL_COMMUNITY): Payer: Self-pay | Admitting: Nurse Practitioner

## 2015-03-17 ENCOUNTER — Ambulatory Visit (HOSPITAL_BASED_OUTPATIENT_CLINIC_OR_DEPARTMENT_OTHER)
Admit: 2015-03-17 | Discharge: 2015-03-17 | Disposition: A | Discharge status: Home / Self Care | Payer: Medicare Other | Source: Ambulatory Visit | Attending: Nurse Practitioner | Admitting: Nurse Practitioner

## 2015-03-17 VITALS — BP 124/66 | HR 64 | Ht 68.0 in | Wt 167.0 lb

## 2015-03-17 DIAGNOSIS — I4819 Other persistent atrial fibrillation: Secondary | ICD-10-CM

## 2015-03-17 DIAGNOSIS — I481 Persistent atrial fibrillation: Secondary | ICD-10-CM

## 2015-03-17 DIAGNOSIS — I469 Cardiac arrest, cause unspecified: Secondary | ICD-10-CM | POA: Diagnosis not present

## 2015-03-17 LAB — MAGNESIUM: Magnesium: 2.3 mg/dL (ref 1.7–2.4)

## 2015-03-17 LAB — BASIC METABOLIC PANEL
ANION GAP: 5 (ref 5–15)
BUN: 15 mg/dL (ref 6–20)
CALCIUM: 9.7 mg/dL (ref 8.9–10.3)
CO2: 30 mmol/L (ref 22–32)
Chloride: 102 mmol/L (ref 101–111)
Creatinine, Ser: 1.24 mg/dL (ref 0.61–1.24)
GFR calc Af Amer: >60 mL/min (ref >60)
GFR, EST NON AFRICAN AMERICAN: 56 mL/min — AB (ref >60)
GLUCOSE: 119 mg/dL — AB (ref 65–99)
POTASSIUM: 4.4 mmol/L (ref 3.5–5.1)
Sodium: 137 mmol/L (ref 135–145)

## 2015-03-17 NOTE — Progress Notes (Signed)
Patient ID: [name], male   DOB: 1941/02/08, 73 y.o.   MRN: [id number]     Primary Care Physician: [name] Stack, MD Referring Physician: Dr. Lillie Columbia I [name] is a 74 y.o. 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 tach and ischemic cardiomyopathy, s/p ICD. He was loaded on Tikosyn for 10/6 to 10/10 and is in the afib clinic for f/u. He reports that he feels well on Tikosyn, has not noticed any irregular heart beat. He knows to take Tikosyn on a consistent basis and not to miss doses. EKG today shows a paced rhythm, interrogation of device shows that pt has not had any further afib since cardioversion on 10/11. Bmet/mag to be repeated today.  Qtx stable at 315ms.  Today, he denies symptoms of palpitations, chest pain, shortness of breath, orthopnea, PND, lower extremity edema, dizziness, presyncope, syncope, or neurologic sequela. The patient is tolerating medications without difficulties and is otherwise without complaint today.   Past Medical History  Diagnosis Date  . CAD (coronary artery disease) 1999    Remote anterolater and apical MI, cath 2011 patent stents RCA/LCx  LAD w/o obstruction  . Cardiomyopathy, ischemic     EF 20-25%  echo 2012  . Chronic systolic congestive heart failure ([place])   . 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 Abdominal aortic aneurysm ([place]) 2009    Repaired with endovascular stent  . PVD (peripheral vascular disease) ([place])     Has occluded innominate vein  . Myocardial infarction ([place])   . Hypertension   . Ventricular tachycardia (HCC)     recurrent slow VT at 100-110 bpm,  Rx mexilitene/sotalol  . Left bundle branch block   . Arthritis   . Gynecomastia     2/2 spironolactone  . Persistent atrial fibrillation ([place])   . Pericarditis     due to perforation  . Inguinal hernia recurrent bilateral   . GERD (gastroesophageal reflux disease)   . Anemia 05/2014  .  Cardiac arrest ([place]) 12/05/2013  . SVT (supraventricular tachycardia) ([place]) 12/05/2013   Past Surgical History  Procedure Laterality Date  . Icd  Feb 2012    Change out with LV lead placed with tunneling from the right  to the left side  . Endovascular stent insertion      for AAA  . Cardiac catheterization      x2  . Pacemaker insertion    . Ventricular ablation surgery      s/p prior EPS and ablation at [facility] 9/12, [facility] 11/12, and most recent [facility] 08/23/11  . Tonsillectomy    . V-tach ablation N/A 08/24/2011    Procedure: V-TACH ABLATION;  Surgeon: Thompson Grayer, MD;  Location: [facility] CATH LAB;  Service: Cardiovascular;  Laterality: N/A;  . Inguinal hernia repair Bilateral 06/20/2014    Procedure: LAPAROSCOPIC BILATERAL INGUINAL HERNIA REPAIR;  Surgeon: Michael Boston, MD;  Location: [place];  Service: General;  Laterality: Bilateral;  . Insertion of mesh N/A 06/20/2014    Procedure: INSERTION OF MESH;  Surgeon: Michael Boston, MD;  Location: [place];  Service: General;  Laterality: N/A;  . Cardioversion N/A 07/05/2014    Procedure: CARDIOVERSION;  Surgeon: Larey Dresser, MD;  Location: [place];  Service: Cardiovascular;  Laterality: N/A;  . Cardioversion N/A 11/22/2014    Procedure: CARDIOVERSION;  Surgeon: Thayer Headings, MD;  Location: [place];  Service:  Cardiovascular;  Laterality: N/A;  . Cardioversion N/A 02/18/2015    Procedure: CARDIOVERSION;  Surgeon: Thayer Headings, MD;  Location: [place];  Service: Cardiovascular;  Laterality: N/A;  . Cardioversion N/A 03/09/2015    Procedure: CARDIOVERSION;  Surgeon: Deboraha Sprang, MD;  Location: [place];  Service: Cardiovascular;  Laterality: N/A;    Current Outpatient Prescriptions  Medication Sig Dispense Refill  . acetaminophen (TYLENOL) 500 MG tablet Take 250 mg by mouth 2 (two) times daily as needed for moderate pain.     [name] ALPRAZolam (XANAX) 0.25 MG tablet Take 0.25 mg by mouth every 4 (four) hours.    . Ascorbic Acid  (VITAMIN C) 500 MG tablet Take 500 mg by mouth daily at 2 PM daily at 2 PM.     . carvedilol (COREG) 12.5 MG tablet Take 1 tablet (12.5 mg total) by mouth 2 (two) times daily. 180 tablet 3  . docusate sodium (COLACE) 100 MG capsule Take 100 mg by mouth daily at 2 PM daily at 2 PM.    . dofetilide (TIKOSYN) 250 MCG capsule Take 1 capsule (250 mcg total) by mouth 2 (two) times daily. 180 capsule 3  . furosemide (LASIX) 20 MG tablet Take 40 mg by mouth 2 (two) times daily. Takes at 6AM.    . hydroxypropyl methylcellulose / hypromellose (ISOPTO TEARS / GONIOVISC) 2.5 % ophthalmic solution Place 1 drop into both eyes as needed for dry eyes.    [name] levothyroxine (SYNTHROID, LEVOTHROID) 88 MCG tablet Take 1 1/2 tablet (132 mcg) on Friday, take 1 tablet (88 mcg) on all other days of the week 100 tablet 3  . loratadine (CLARITIN) 10 MG tablet Take 10 mg by mouth daily at 2 PM daily at 2 PM.     . losartan (COZAAR) 50 MG tablet Take 25 mg by mouth daily.    . meclizine (ANTIVERT) 25 MG tablet TAKE 1 TABLET (25 MG TOTAL) BY MOUTH 3 (THREE) TIMES DAILY AS NEEDED. (Patient taking differently: TAKE 1 TABLET (25 MG TOTAL) BY MOUTH 3 (THREE) TIMES DAILY.) 270 tablet 0  . mexiletine (MEXITIL) 150 MG capsule Take 150 mg by mouth 3 (three) times daily.    . Multiple Vitamin (MULTIVITAMIN WITH MINERALS) TABS tablet Take 1 tablet by mouth daily at 2 PM daily at 2 PM.     . Omega-3 Fatty Acids (FISH OIL) 1000 MG CAPS Take 1,000 mg by mouth 2 (two) times daily. 6am and 6pm    . potassium chloride SA (K-DUR,KLOR-CON) 20 MEQ tablet Take 1 tablet (20 mEq total) by mouth daily. 90 tablet 3  . ranitidine (ZANTAC) 150 MG tablet Take 150 mg by mouth at bedtime.     . rosuvastatin (CRESTOR) 5 MG tablet Take 5 mg by mouth. Take 1 tablet by mouth two times a week (Tuesdays Saturdays)    . warfarin (COUMADIN) 2.5 MG tablet TAKE AS DIRECTED BY COUMADIN CLINIC (Patient taking differently: Take 2.5mg  (1 tablet) on Tues and Sat and 3.75mg   (1 1/2 tablets) all other days.) 120 tablet 1   No current facility-administered medications for this encounter.    Allergies  Allergen Reactions  . Iohexol Hives and Itching     Code: HIVES, Desc: hives w/ itching during cardiac cath '99, mult caths since w/ ? premeds, DR.G.HAYES REQUESTS 13 HR PRE MED//A.C.pt okay w/13 hr prep/mms, Onset Date: [id number]   . Prednisone Hives    Social History   Social History  . Marital Status: Married  Spouse Name: N/A  . Number of Children: N/A  . Years of Education: N/A   Occupational History  . retired      used to Armed forces operational officer   Social History Main Topics  . Smoking status: Former Smoker    Quit date: 01/07/1994  . Smokeless tobacco: Never Used  . Alcohol Use: 0.0 oz/week    0 Standard drinks or equivalent per week     Comment: very rare  . Drug Use: No  . Sexual Activity: Not Currently   Other Topics Concern  . Not on file   Social History Narrative    Family History  Problem Relation Age of Onset  . Stroke Mother   . Heart attack Mother   . Heart disease Mother   . Hyperlipidemia Mother   . Hypertension Mother   . Heart attack Father   . Heart disease Father   . Hyperlipidemia Father   . Hypertension Father   . Heart disease Sister   . Hyperlipidemia Sister   . Hypertension Brother     ROS- All systems are reviewed and negative except as per the HPI above  Physical Exam: Filed Vitals:   03/17/15 1004  BP: 124/66  Pulse: 64  Height: 5\' 8"  (1.727 m)  Weight: 167 lb (75.751 kg)    GEN- The patient is well appearing, alert and oriented x 3 today.   Head- normocephalic, atraumatic Eyes-  Sclera clear, conjunctiva pink Ears- hearing intact Oropharynx- clear Neck- supple, no JVP Lymph- no cervical lymphadenopathy Lungs- Clear to ausculation bilaterally, normal work of breathing Heart- Regular rate and rhythm, no murmurs, rubs or gallops, PMI not laterally displaced GI- soft, NT, ND, + BS Extremities-  no clubbing, cyanosis, or edema MS- no significant deformity or atrophy Skin- no rash or lesion Psych- euthymic mood, full affect Neuro- strength and sensation are intact  EKG- V paced rhythm,  QTc stable at 359ms, interrogation of  device shows SR.  Assessment and Plan:  1. Persistent afib SR today since DCCV on discharge Feels well Bmet/mag to be done today Continue Tikosyn at 250 mg bid Continue warfarin  F/u with Dr.  Mare Ferrari at 11/16,  F/u with Dr. Caryl Comes, 12/30  Geroge Baseman. Draeden Kellman, [facility] [address] [place], [place] [address] [phone]

## 2015-03-18 ENCOUNTER — Inpatient Hospital Stay (HOSPITAL_COMMUNITY)
Admission: EM | Admit: 2015-03-18 | Discharge: 2015-04-01 | DRG: 296 | Disposition: E | Payer: Medicare Other | Attending: Pulmonary Disease | Admitting: Pulmonary Disease

## 2015-03-18 ENCOUNTER — Emergency Department (HOSPITAL_COMMUNITY): Payer: Medicare Other

## 2015-03-18 ENCOUNTER — Encounter (HOSPITAL_COMMUNITY): Payer: Self-pay

## 2015-03-18 DIAGNOSIS — K219 Gastro-esophageal reflux disease without esophagitis: Secondary | ICD-10-CM | POA: Diagnosis present

## 2015-03-18 DIAGNOSIS — E872 Acidosis: Secondary | ICD-10-CM

## 2015-03-18 DIAGNOSIS — I779 Disorder of arteries and arterioles, unspecified: Secondary | ICD-10-CM

## 2015-03-18 DIAGNOSIS — Z87891 Personal history of nicotine dependence: Secondary | ICD-10-CM | POA: Diagnosis not present

## 2015-03-18 DIAGNOSIS — Z91041 Radiographic dye allergy status: Secondary | ICD-10-CM | POA: Diagnosis not present

## 2015-03-18 DIAGNOSIS — I4892 Unspecified atrial flutter: Secondary | ICD-10-CM | POA: Diagnosis present

## 2015-03-18 DIAGNOSIS — Z888 Allergy status to other drugs, medicaments and biological substances status: Secondary | ICD-10-CM

## 2015-03-18 DIAGNOSIS — I519 Heart disease, unspecified: Secondary | ICD-10-CM | POA: Diagnosis not present

## 2015-03-18 DIAGNOSIS — I110 Hypertensive heart disease with heart failure: Secondary | ICD-10-CM | POA: Diagnosis present

## 2015-03-18 DIAGNOSIS — E874 Mixed disorder of acid-base balance: Secondary | ICD-10-CM | POA: Diagnosis present

## 2015-03-18 DIAGNOSIS — I252 Old myocardial infarction: Secondary | ICD-10-CM | POA: Diagnosis not present

## 2015-03-18 DIAGNOSIS — I447 Left bundle-branch block, unspecified: Secondary | ICD-10-CM | POA: Diagnosis present

## 2015-03-18 DIAGNOSIS — I2510 Atherosclerotic heart disease of native coronary artery without angina pectoris: Secondary | ICD-10-CM | POA: Diagnosis present

## 2015-03-18 DIAGNOSIS — E0590 Thyrotoxicosis, unspecified without thyrotoxic crisis or storm: Secondary | ICD-10-CM | POA: Diagnosis present

## 2015-03-18 DIAGNOSIS — Z7901 Long term (current) use of anticoagulants: Secondary | ICD-10-CM | POA: Diagnosis not present

## 2015-03-18 DIAGNOSIS — Z8249 Family history of ischemic heart disease and other diseases of the circulatory system: Secondary | ICD-10-CM | POA: Diagnosis not present

## 2015-03-18 DIAGNOSIS — I5022 Chronic systolic (congestive) heart failure: Secondary | ICD-10-CM | POA: Diagnosis present

## 2015-03-18 DIAGNOSIS — J810 Acute pulmonary edema: Secondary | ICD-10-CM | POA: Diagnosis present

## 2015-03-18 DIAGNOSIS — I472 Ventricular tachycardia: Secondary | ICD-10-CM | POA: Diagnosis present

## 2015-03-18 DIAGNOSIS — I714 Abdominal aortic aneurysm, without rupture: Secondary | ICD-10-CM | POA: Diagnosis present

## 2015-03-18 DIAGNOSIS — I469 Cardiac arrest, cause unspecified: Secondary | ICD-10-CM

## 2015-03-18 DIAGNOSIS — Z79899 Other long term (current) drug therapy: Secondary | ICD-10-CM | POA: Diagnosis not present

## 2015-03-18 DIAGNOSIS — Z515 Encounter for palliative care: Secondary | ICD-10-CM | POA: Diagnosis present

## 2015-03-18 DIAGNOSIS — N179 Acute kidney failure, unspecified: Secondary | ICD-10-CM | POA: Diagnosis present

## 2015-03-18 DIAGNOSIS — I253 Aneurysm of heart: Secondary | ICD-10-CM | POA: Diagnosis present

## 2015-03-18 DIAGNOSIS — Z95810 Presence of automatic (implantable) cardiac defibrillator: Secondary | ICD-10-CM

## 2015-03-18 DIAGNOSIS — I481 Persistent atrial fibrillation: Secondary | ICD-10-CM | POA: Diagnosis present

## 2015-03-18 DIAGNOSIS — I255 Ischemic cardiomyopathy: Secondary | ICD-10-CM | POA: Diagnosis present

## 2015-03-18 DIAGNOSIS — I4901 Ventricular fibrillation: Secondary | ICD-10-CM | POA: Diagnosis not present

## 2015-03-18 DIAGNOSIS — I25118 Atherosclerotic heart disease of native coronary artery with other forms of angina pectoris: Secondary | ICD-10-CM

## 2015-03-18 DIAGNOSIS — G931 Anoxic brain damage, not elsewhere classified: Secondary | ICD-10-CM | POA: Diagnosis present

## 2015-03-18 DIAGNOSIS — Z66 Do not resuscitate: Secondary | ICD-10-CM | POA: Diagnosis present

## 2015-03-18 DIAGNOSIS — G253 Myoclonus: Secondary | ICD-10-CM | POA: Diagnosis present

## 2015-03-18 DIAGNOSIS — I739 Peripheral vascular disease, unspecified: Secondary | ICD-10-CM | POA: Diagnosis present

## 2015-03-18 DIAGNOSIS — I4891 Unspecified atrial fibrillation: Secondary | ICD-10-CM | POA: Diagnosis not present

## 2015-03-18 HISTORY — DX: Unspecified atrial flutter: I48.92

## 2015-03-18 HISTORY — DX: Aneurysm of heart: I25.3

## 2015-03-18 LAB — I-STAT ARTERIAL BLOOD GAS, ED
Acid-base deficit: 21 mmol/L — ABNORMAL HIGH (ref 0.0–2.0)
Acid-base deficit: 10 mmol/L — ABNORMAL HIGH (ref 0.0–2.0)
BICARBONATE: 12.9 meq/L — AB (ref 20.0–24.0)
Bicarbonate: 19.3 mEq/L — ABNORMAL LOW (ref 20.0–24.0)
O2 SAT: 79 %
O2 Saturation: 85 %
PCO2 ART: 72.7 mmHg — AB (ref 35.0–45.0)
PCO2 ART: 55.5 mmHg — AB (ref 35.0–45.0)
PO2 ART: 65 mmHg — AB (ref 80.0–100.0)
Patient temperature: 98.7
TCO2: 15 mmol/L (ref 0–100)
TCO2: 21 mmol/L (ref 0–100)
pH, Arterial: 6.856 — CL (ref 7.350–7.450)
pH, Arterial: 7.151 — CL (ref 7.350–7.450)
pO2, Arterial: 76 mmHg — ABNORMAL LOW (ref 80.0–100.0)

## 2015-03-18 LAB — BASIC METABOLIC PANEL
Anion gap: 21 — ABNORMAL HIGH (ref 5–15)
BUN: 16 mg/dL (ref 6–20)
CO2: 15 mmol/L — ABNORMAL LOW (ref 22–32)
Calcium: 8.7 mg/dL — ABNORMAL LOW (ref 8.9–10.3)
Chloride: 101 mmol/L (ref 101–111)
Creatinine, Ser: 1.54 mg/dL — ABNORMAL HIGH (ref 0.61–1.24)
GFR calc Af Amer: 50 mL/min — ABNORMAL LOW (ref >60)
GFR calc non Af Amer: 43 mL/min — ABNORMAL LOW (ref >60)
GLUCOSE: 211 mg/dL — AB (ref 65–99)
POTASSIUM: 4.3 mmol/L (ref 3.5–5.1)
SODIUM: 137 mmol/L (ref 135–145)

## 2015-03-18 LAB — CBC
HCT: 43.7 % (ref 39.0–52.0)
HEMOGLOBIN: 13.9 g/dL (ref 13.0–17.0)
MCH: 31.7 pg (ref 26.0–34.0)
MCHC: 31.8 g/dL (ref 30.0–36.0)
MCV: 99.8 fL (ref 78.0–100.0)
Platelets: 184 10*3/uL (ref 150–400)
RBC: 4.38 MIL/uL (ref 4.22–5.81)
RDW: 12.7 % (ref 11.5–15.5)
WBC: 7.7 10*3/uL (ref 4.0–10.5)

## 2015-03-18 LAB — I-STAT TROPONIN, ED: Troponin i, poc: 0.05 ng/mL (ref 0.00–0.08)

## 2015-03-18 LAB — I-STAT CG4 LACTIC ACID, ED: LACTIC ACID, VENOUS: 12.25 mmol/L — AB (ref 0.5–2.0)

## 2015-03-18 MED ORDER — MIDAZOLAM HCL 2 MG/2ML IJ SOLN: 1.0000 mg | INTRAMUSCULAR | Status: DC | PRN

## 2015-03-18 MED ORDER — DEXTROSE 5 % IV SOLN
0.5000 ug/min | INTRAVENOUS | Status: DC
  Administered 2015-03-18: 5 ug/min via INTRAVENOUS
  Filled 2015-03-18: qty 4

## 2015-03-18 MED ORDER — PANTOPRAZOLE SODIUM 40 MG IV SOLR: 40.0000 mg | Freq: Every day | INTRAVENOUS | Status: DC

## 2015-03-18 MED ORDER — GLYCOPYRROLATE 1 MG PO TABS
1.0000 mg | ORAL_TABLET | ORAL | Status: DC | PRN
  Filled 2015-03-18: qty 1

## 2015-03-18 MED ORDER — MIDAZOLAM HCL 2 MG/2ML IJ SOLN
2.0000 mg | Freq: Once | INTRAMUSCULAR | Status: AC
  Administered 2015-03-18: 2 mg via INTRAVENOUS

## 2015-03-18 MED ORDER — SODIUM CHLORIDE 0.9 % IV SOLN
25.0000 ug/h | INTRAVENOUS | Status: DC
  Administered 2015-03-18: 75 ug/h via INTRAVENOUS
  Filled 2015-03-18: qty 50

## 2015-03-18 MED ORDER — POLYVINYL ALCOHOL 1.4 % OP SOLN
1.0000 [drp] | Freq: Four times a day (QID) | OPHTHALMIC | Status: DC | PRN
  Filled 2015-03-18: qty 15

## 2015-03-18 MED ORDER — FENTANYL BOLUS VIA INFUSION
25.0000 ug | INTRAVENOUS | Status: DC | PRN
  Filled 2015-03-18: qty 25

## 2015-03-18 MED ORDER — SODIUM BICARBONATE 8.4 % IV SOLN
INTRAVENOUS | Status: AC | PRN
  Administered 2015-03-18: 50 meq via INTRAVENOUS

## 2015-03-18 MED ORDER — FENTANYL CITRATE (PF) 100 MCG/2ML IJ SOLN
50.0000 ug | Freq: Once | INTRAMUSCULAR | Status: AC
  Administered 2015-03-18: 50 ug via INTRAVENOUS

## 2015-03-18 MED ORDER — CISATRACURIUM BESYLATE (PF) 200 MG/20ML IV SOLN
1.0000 ug/kg/min | INTRAVENOUS | Status: DC
  Filled 2015-03-18: qty 20

## 2015-03-18 MED ORDER — NOREPINEPHRINE BITARTRATE 1 MG/ML IV SOLN
0.0000 ug/min | INTRAVENOUS | Status: DC
  Administered 2015-03-18: 5 ug/min via INTRAVENOUS
  Filled 2015-03-18: qty 4

## 2015-03-18 MED ORDER — BIOTENE DRY MOUTH MT LIQD: 15.0000 mL | OROMUCOSAL | Status: DC | PRN

## 2015-03-18 MED ORDER — SODIUM CHLORIDE 0.9 % IV SOLN
25.0000 ug/h | INTRAVENOUS | Status: DC
  Filled 2015-03-18: qty 50

## 2015-03-18 MED ORDER — MIDAZOLAM HCL 2 MG/2ML IJ SOLN: 1.0000 mg | Freq: Once | INTRAMUSCULAR | Status: DC

## 2015-03-18 MED ORDER — HEPARIN BOLUS VIA INFUSION
4000.0000 [IU] | Freq: Once | INTRAVENOUS | Status: DC
  Filled 2015-03-18: qty 4000

## 2015-03-18 MED ORDER — ETOMIDATE 2 MG/ML IV SOLN: 20.0000 mg | Freq: Once | INTRAVENOUS | Status: DC

## 2015-03-18 MED ORDER — GLYCOPYRROLATE 0.2 MG/ML IJ SOLN
0.2000 mg | INTRAMUSCULAR | Status: DC | PRN
  Filled 2015-03-18: qty 1

## 2015-03-18 MED ORDER — ARTIFICIAL TEARS OP OINT
1.0000 "application " | TOPICAL_OINTMENT | Freq: Three times a day (TID) | OPHTHALMIC | Status: DC
  Filled 2015-03-18: qty 3.5

## 2015-03-18 MED ORDER — FENTANYL CITRATE (PF) 100 MCG/2ML IJ SOLN: 50.0000 ug | Freq: Once | INTRAMUSCULAR | Status: DC

## 2015-03-18 MED ORDER — MIDAZOLAM BOLUS VIA INFUSION
1.0000 mg | INTRAVENOUS | Status: DC | PRN
  Filled 2015-03-18: qty 1

## 2015-03-18 MED ORDER — CISATRACURIUM BOLUS VIA INFUSION
0.1000 mg/kg | Freq: Once | INTRAVENOUS | Status: DC
  Filled 2015-03-18: qty 8

## 2015-03-18 MED ORDER — EPINEPHRINE HCL 0.1 MG/ML IJ SOSY
PREFILLED_SYRINGE | INTRAMUSCULAR | Status: AC | PRN
  Administered 2015-03-18 (×3): 1 mg via INTRAVENOUS

## 2015-03-18 MED ORDER — ASPIRIN 300 MG RE SUPP: 300.0000 mg | RECTAL | Status: DC

## 2015-03-18 MED ORDER — SODIUM CHLORIDE 0.9 % IV SOLN
1.0000 mg/h | INTRAVENOUS | Status: DC
  Administered 2015-03-18: 1 mg/h via INTRAVENOUS
  Administered 2015-03-18: 10 mg/h via INTRAVENOUS
  Filled 2015-03-18 (×2): qty 10

## 2015-03-18 MED ORDER — SODIUM CHLORIDE 0.9 % IV SOLN
2000.0000 mL | Freq: Once | INTRAVENOUS | Status: AC
  Administered 2015-03-18: 2000 mL via INTRAVENOUS

## 2015-03-18 MED ORDER — ONDANSETRON 4 MG PO TBDP: 4.0000 mg | ORAL_TABLET | Freq: Four times a day (QID) | ORAL | Status: DC | PRN

## 2015-03-18 MED ORDER — CISATRACURIUM BOLUS VIA INFUSION
0.0500 mg/kg | INTRAVENOUS | Status: DC | PRN
  Filled 2015-03-18: qty 4

## 2015-03-18 MED ORDER — MIDAZOLAM HCL 2 MG/2ML IJ SOLN
2.0000 mg | INTRAMUSCULAR | Status: DC | PRN
  Filled 2015-03-18: qty 2

## 2015-03-18 MED ORDER — HEPARIN (PORCINE) IN NACL 100-0.45 UNIT/ML-% IJ SOLN
900.0000 [IU]/h | INTRAMUSCULAR | Status: DC
  Filled 2015-03-18: qty 250

## 2015-03-18 MED ORDER — FENTANYL CITRATE (PF) 100 MCG/2ML IJ SOLN
50.0000 ug | INTRAMUSCULAR | Status: DC | PRN
  Filled 2015-03-18: qty 2

## 2015-03-18 MED ORDER — ONDANSETRON HCL 4 MG/2ML IJ SOLN: 4.0000 mg | Freq: Four times a day (QID) | INTRAMUSCULAR | Status: DC | PRN

## 2015-03-18 NOTE — Progress Notes (Signed)
RT note: Pt. arrived with [facility] airway in place s/p arrest, with EtC02  placed inline, exchanged [place] with 7.5 E.T. after  ROSC, placed on vent, RT to monitor.

## 2015-03-18 NOTE — H&P (Signed)
Name: [name] MRN: [id number] DOB: 1940/09/01    ADMISSION DATE:  03/29/2015 CONSULTATION DATE:  03/23/2015  REFERRING MD :  EDP  CHIEF COMPLAINT:  Cardiac Arrest  BRIEF PATIENT DESCRIPTION: 74 y.o. M brought to [facility] ED 10/18 after witnessed cardiac arrest with 30 min downtime.  PCCM called for admission.  After discussion with wife, she would like to proceed with comfort care measures.   SIGNIFICANT EVENTS  10/18 - admitted after cardiac arrest  STUDIES:  CXR 10/18 >>> cardiomegaly with perihilar opacities c/w pulmonary edema.   HISTORY OF PRESENT ILLNESS:  [name] is a 74 y.o. M with PMH as outlined below including extensive cardiac hx.  He was brought to [facility] ED 10/18 after a cardiac arrest.  He apparently called his neighbor who is a friend of 33 years and informed her that she was SOB and could not catch his breath.  Neighbor called EMS and per her report, they arrived within 5 minutes.  When she went to his house, she found him slumped over in the chair and he was still breathing, although had difficulty doing so. After EMS loaded him into the ambulance, he apparently had a PEA followed by VF arrest.  He received 7 rounds of epi, 450mg  amiodarone, 2 shocks.  ACLS was performed for roughly 30 minutes before ROSC.  In ED, he was placed on 48mcg epinephrine and 23mcg levophed for BP support.  He also had significant myoclonic jerks.  Pupils were non-reactive and he had no gag reflex.  After extensive discussion with pt's wife, it was learned that pt was in fact DNR.  She visited with the patient and after seeing the state he was in, decided to not move forward with aggressive measures.  He will be admitted upstairs and once there, we will proceed with comfort care measures.  PAST MEDICAL HISTORY :   has a past medical history of CAD (coronary artery disease) (1999); Cardiomyopathy, ischemic; Chronic systolic congestive heart failure ([place]); Thyrotoxicosis; Abdominal  aortic aneurysm ([place]) (2009); PVD (peripheral vascular disease) ([place]); Myocardial infarction ([place]); Hypertension; Ventricular tachycardia ([place]); Left bundle branch block; Arthritis; Gynecomastia; Persistent atrial fibrillation ([place]); Pericarditis; Inguinal hernia recurrent bilateral; GERD (gastroesophageal reflux disease); Anemia (05/2014); Cardiac arrest ([place]) (12/05/2013); SVT (supraventricular tachycardia) ([place]) (12/05/2013); Paroxysmal atrial flutter ([place]); and Left ventricular aneurysm.  has past surgical history that includes ICD (Feb 2012); Endovascular stent insertion; Cardiac catheterization; Pacemaker insertion; Ventricular ablation surgery; Tonsillectomy; v-tach ablation (N/A, 08/24/2011); Inguinal hernia repair (Bilateral, 06/20/2014); Insertion of mesh (N/A, 06/20/2014); Cardioversion (N/A, 07/05/2014); Cardioversion (N/A, 11/22/2014); Cardioversion (N/A, 02/18/2015); and Cardioversion (N/A, 03/09/2015). Prior to Admission medications   Medication Sig Start Date End Date Taking? Authorizing Provider  acetaminophen (TYLENOL) 500 MG tablet Take 250 mg by mouth 2 (two) times daily as needed for moderate pain.     Historical Provider, MD  ALPRAZolam [name]) 0.25 MG tablet Take 0.25 mg by mouth every 4 (four) hours.    Historical Provider, MD  Ascorbic Acid (VITAMIN C) 500 MG tablet Take 500 mg by mouth daily at 2 PM daily at 2 PM.     Historical Provider, MD  carvedilol (COREG) 12.5 MG tablet Take 1 tablet (12.5 mg total) by mouth 2 (two) times daily. 10/17/14   Darlin Coco, MD  docusate sodium (COLACE) 100 MG capsule Take 100 mg by mouth daily at 2 PM daily at 2 PM.    Historical Provider, MD  dofetilide (TIKOSYN) 250 MCG capsule Take 1 capsule (250 mcg  total) by mouth 2 (two) times daily. 03/10/15   Amber Sena Slate, NP  furosemide (LASIX) 20 MG tablet Take 40 mg by mouth 2 (two) times daily. Takes at 6AM.    Historical Provider, MD  hydroxypropyl methylcellulose / hypromellose (ISOPTO TEARS / GONIOVISC)  2.5 % ophthalmic solution Place 1 drop into both eyes as needed for dry eyes.    Historical Provider, MD  levothyroxine (SYNTHROID, LEVOTHROID) 88 MCG tablet Take 1 1/2 tablet (132 mcg) on Friday, take 1 tablet (88 mcg) on all other days of the week 11/29/14   Darlin Coco, MD  loratadine (CLARITIN) 10 MG tablet Take 10 mg by mouth daily at 2 PM daily at 2 PM.     Historical Provider, MD  losartan (COZAAR) 50 MG tablet Take 25 mg by mouth daily.    Historical Provider, MD  meclizine (ANTIVERT) 25 MG tablet TAKE 1 TABLET (25 MG TOTAL) BY MOUTH 3 (THREE) TIMES DAILY AS NEEDED. Patient taking differently: TAKE 1 TABLET (25 MG TOTAL) BY MOUTH 3 (THREE) TIMES DAILY. 01/21/15   Darlin Coco, MD  mexiletine (MEXITIL) 150 MG capsule Take 150 mg by mouth 3 (three) times daily.    Historical Provider, MD  Multiple Vitamin (MULTIVITAMIN WITH MINERALS) TABS tablet Take 1 tablet by mouth daily at 2 PM daily at 2 PM.     Historical Provider, MD  Omega-3 Fatty Acids (FISH OIL) 1000 MG CAPS Take 1,000 mg by mouth 2 (two) times daily. 6am and 6pm    Historical Provider, MD  potassium chloride SA (K-DUR,KLOR-CON) 20 MEQ tablet Take 1 tablet (20 mEq total) by mouth daily. 03/10/15   Amber Sena Slate, NP  ranitidine (ZANTAC) 150 MG tablet Take 150 mg by mouth at bedtime.     Historical Provider, MD  rosuvastatin (CRESTOR) 5 MG tablet Take 5 mg by mouth. Take 1 tablet by mouth two times a week (Tuesdays Saturdays)    Historical Provider, MD  warfarin (COUMADIN) 2.5 MG tablet TAKE AS DIRECTED BY COUMADIN CLINIC Patient taking differently: Take 2.5mg  (1 tablet) on Tues and Sat and 3.75mg  (1 1/2 tablets) all other days. 01/21/15   Darlin Coco, MD   Allergies  Allergen Reactions  . Iohexol Hives and Itching     Code: HIVES, Desc: hives w/ itching during cardiac cath '99, mult caths since w/ ? premeds, DR.G.HAYES REQUESTS 13 HR PRE MED//A.C.pt okay w/13 hr prep/mms, Onset Date: [id number]   . Prednisone Hives     FAMILY HISTORY:  family history includes Heart attack in his father and mother; Heart disease in his father, mother, and sister; Hyperlipidemia in his father, mother, and sister; Hypertension in his brother, father, and mother; Stroke in his mother. SOCIAL HISTORY:  reports that he quit smoking about 21 years ago. He has never used smokeless tobacco. He reports that he drinks alcohol. He reports that he does not use illicit drugs.  REVIEW OF SYSTEMS:  Unable to obtain as pt is encephalopathic.   SUBJECTIVE:   VITAL SIGNS: Pulse Rate:  [60-69] 61 (10/18 2116) Resp:  [12-26] 22 (10/18 2116) BP: (73-147)/(46-77) 147/60 mmHg (10/18 2116) SpO2:  [89 %-100 %] 90 % (10/18 2116) FiO2 (%):  [100 %] 100 % (10/18 1931)  PHYSICAL EXAMINATION: General: Elderly appearing male, critically ill. Neuro: Non-responsive.  Significant myoclonic jerks. HEENT: [place]/AT. Pupils non-responsive, arcus senilis.  ETT in place. Cardiovascular: IRIR, no M/R/G.  Lungs: Respirations even and unlabored.  Bibasilar crackles. Abdomen: BS x 4, soft, NT/ND.  Musculoskeletal:  No gross deformities, no edema.  Skin: Intact, warm, no rashes.     Recent Labs Lab 03/17/15 1030 03/04/2015 1910  NA 137 137  K 4.4 4.3  CL 102 101  CO2 30 15*  BUN 15 16  CREATININE 1.24 1.54*  GLUCOSE 119* 211*    Recent Labs Lab 03/31/2015 1910  HGB 13.9  HCT 43.7  WBC 7.7  PLT 184   Dg Chest Portable 1 View  03/21/2015  CLINICAL DATA:  Endotracheal tube placement. EXAM: PORTABLE CHEST 1 VIEW COMPARISON:  01/19/2015 FINDINGS: Endotracheal tube is 3.4 cm from the carina. Enteric tube in place, tip below the diaphragm not included in the field of view. Multi lead left-sided pacemaker remains in place. Bilateral perihilar alveolar opacities consistent with pulmonary edema. There is cardiomegaly. Questionable blunting of both costophrenic angles. No pneumothorax. No acute osseous abnormalities are seen. IMPRESSION: 1. Endotracheal  tube 3.4 cm from the carina.  Enteric tube in place. 2. Cardiomegaly with bilateral perihilar alveolar opacities, most consistent with pulmonary edema. Electronically Signed   By: Jeb Levering M.D.   On: 03/26/2015 19:45    ASSESSMENT / PLAN:  Cardiac Arrest with 30 minutes downtime. 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 Plan: I have had extensive bedside discussion with Mrs. Angst regarding the current circumstances, organ failures, poor prognosis, and likely poor quality of life for Mr. Utke. We also discussed his prior wishes under circumstances such as this.  She has decided to offer full comfort care for him. She is aware that he may be transferred to the palliative care floor for continued comfort care needs. She has been fully updated on the process and expectations and all questions have been answered.    Montey Hora, [facility] Pager: [phone]  or [phone] 03/04/2015, 9:26 PM

## 2015-03-18 NOTE — Progress Notes (Signed)
   03/17/2015 2300  Clinical Encounter Type  Visited With Patient;Family;Patient and family together;Health care provider  Visit Type Patient actively dying;ED;Critical Care  Referral From Nurse  Consult/Referral To Chaplain  Spiritual Encounters  Spiritual Needs Prayer;Emotional;Grief support  [facility] paged to ED for post-CPR; pt transported to [facility] for comfort care; [place] offered emotional, spiritual and support as pt put into comfort care; 11:45 PM Gwynn Burly

## 2015-03-18 NOTE — ED Notes (Signed)
See Code Narrator

## 2015-03-18 NOTE — Code Documentation (Signed)
Critical Care at bedside.  

## 2015-03-18 NOTE — ED Notes (Signed)
Pacemaker interrogation completed 

## 2015-03-18 NOTE — Progress Notes (Signed)
ANTICOAGULATION CONSULT NOTE - Initial Consult  Pharmacy Consult for heparin Indication: chest pain/ACS  Allergies  Allergen Reactions  . Iohexol Hives and Itching     Code: HIVES, Desc: hives w/ itching during cardiac cath '99, mult caths since w/ ? premeds, DR.G.HAYES REQUESTS 13 HR PRE MED//A.C.pt okay w/13 hr prep/mms, Onset Date: [id number]   . Prednisone Hives    Patient Measurements:   Heparin Dosing Weight: 75.8 kg  Vital Signs: BP: 104/56 mmHg (10/18 2028) Pulse Rate: 60 (10/18 2028)  Labs:  Recent Labs  03/17/15 1030 03/26/2015 1910  HGB  --  13.9  HCT  --  43.7  PLT  --  184  CREATININE 1.24 1.54*    Estimated Creatinine Clearance: 41.3 mL/min (by C-G formula based on Cr of 1.54).   Medical History: Past Medical History  Diagnosis Date  . CAD (coronary artery disease) 1999    a. Remote anterolater and apical MI. b. cath 2011 patent stents RCA/LCx  LAD w/o obstruction  . Cardiomyopathy, ischemic     a. EF 25-30% by last echo 03/2015.  [name] Chronic systolic congestive heart failure ([place])   . 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 Abdominal aortic aneurysm ([place]) 2009    Repaired with endovascular stent  . PVD (peripheral vascular disease) ([place])     Has occluded innominate vein  . Myocardial infarction ([place])   . Hypertension   . Ventricular tachycardia ([place])     VT storm s/p ablation 2012 with Dr Rayann Heman, repeat ablation at [facility] 2706 complicated by pericardial perforation and pericarditis, repeat ablation 2013 by Dr Rayann Heman  . Left bundle branch block   . Arthritis   . Gynecomastia     2/2 spironolactone  . Persistent atrial fibrillation ([place])     a. Failed amio due to thyrotoxicosis, previously on sotalol and mexilitine. b. s/p Tikosyn initiation 03/2015.  [name] Pericarditis     due to perforation  . Inguinal hernia recurrent bilateral   . GERD (gastroesophageal reflux disease)   . Anemia 05/2014  . Cardiac arrest ([place]) 12/05/2013  . SVT (supraventricular  tachycardia) ([place]) 12/05/2013  . Paroxysmal atrial flutter ([place])   . Left ventricular aneurysm     Medications:   (Not in a hospital admission)  Assessment: 25 yoM presenting to ED in cardiac arrest after having SOB, initial rhythm was PEA. PMH of afib, initiated on Tikosyn on 10/6, also on warfarin PTA.   INR 2.74 at admission, Plt 184, Hgb 13.9  Goal of Therapy:  Heparin level 0.3-0.7 units/ml Monitor platelets by anticoagulation protocol: Yes   Plan:  Start heparin infusion at 900 units/hr Check anti-Xa level in 8 hours and daily while on heparin Continue to monitor H&H and platelets  Governor Specking, PharmD Clinical Pharmacy Resident Pager: [phone] 03/21/2015,8:42 PM

## 2015-03-18 NOTE — Progress Notes (Signed)
Terminal extubation performed per MD order. Ventilator removed from room.  Wife stayed by patients side during the procedure.

## 2015-03-18 NOTE — Code Documentation (Signed)
ED resident to place art line, resp tech at bedside.

## 2015-03-18 NOTE — ED Notes (Signed)
Wallet given to wife

## 2015-03-18 NOTE — Code Documentation (Signed)
Chaplain at bedside

## 2015-03-18 NOTE — Code Documentation (Signed)
Requested chaplain be paged.

## 2015-03-18 NOTE — Consult Note (Signed)
Cardiology Consultation Note  Patient ID: DESHANNON HINCHLIFFE, MRN: [id number], DOB/AGE: May 12, 1941 74 y.o. Admit date: 03/30/2015   Date of Consult: 03/30/2015 Primary Physician: Sheela Stack, MD Primary Cardiologist: Dr. Jolyn Nap  Chief Complaint: cardiac arrest Reason for Consultation: cardiac arrest  HPI: Mr. Vandam is a 74 y/o M with history of persistent atrial fibrillation s/p recent DCCV/Tikosyn initiation, paroxysmal atrial flutter, CAD (s/p remote anterior MI, cath in 2011 with patent stents), ICM with LV aneurysm, VT (VT storm s/p ablation 2012 with Dr Rayann Heman, repeat ablation at [facility] complicated by pericardial perforation and pericarditis, repeat ablation 2013 by Dr Rayann Heman), chronic systolic heart failure, hypertension, anemia following inguinal hernia repair s/p transfusion, LBBB, AAA s/p endovascular stent, who presented to [facility] with cardiac arrest. 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 He has previously been maintained on sotalol and mexiletine for atrial and ventricular arrhythmias.He developed recurrent atrial fib/flutter in late September 2016 s/p DCCV with early return of symptomatic atrial arrhythmias. He was admitted 10/6-10/10/16 and underwent Tikosyn loading and DCCV with uneventful hospital admission. He went home on dofetilide 250mg  BID, carvedilol 12.5mg  BID and mexilite 150mg  TID. Last echo 03/2015 showed EF 25-30%, mild-mod AS, mild-mod MR, severe LAE, mildly reduced RV function, mod TR, PASP 35. Seen in AF clinic yesterday and feeling well; EKG showed AV pacing.  Per report from nursing staff and consulting providers, patient called his neighbor today for SOB. EMS was called and the patient had a witnessed arrest with agonal respirations. He subsequently had reported PEA as well as VF. He received 7 rounds of epi, 450mg  of amiodarone, 2 shocks administered. Pulses returned after reported 30 minutes of CPR. He was  intubated. Workup thus far has revealed Cr 1.24->1.54, troponin neg x1, lactic acidosis 12.25, pH 6.8, CBC normal. CXR: Cardiomegaly with bilateral perihilar alveolar opacities, most consistent with pulmonary edema. There is concern for anoxic encephalopathy given posturing and myoclonic activity. He is currently AV paced. BP was 73/53 earlier, now 104/53.    Past Medical History  Diagnosis Date  . CAD (coronary artery disease) 1999    a. Remote anterolater and apical MI. b. cath 2011 patent stents RCA/LCx  LAD w/o obstruction  . Cardiomyopathy, ischemic     a. EF 25-30% by last echo 03/2015.  [name] Chronic systolic congestive heart failure ([place])   . 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 Abdominal aortic aneurysm ([place]) 2009    Repaired with endovascular stent  . PVD (peripheral vascular disease) ([place])     Has occluded innominate vein  . Myocardial infarction ([place])   . Hypertension   . Ventricular tachycardia ([place])     VT storm s/p ablation 2012 with Dr Rayann Heman, repeat ablation at [facility] complicated by pericardial perforation and pericarditis, repeat ablation 2013 by Dr Rayann Heman  . Left bundle branch block   . Arthritis   . Gynecomastia     2/2 spironolactone  . Persistent atrial fibrillation ([place])     a. Failed amio due to thyrotoxicosis, previously on sotalol and mexilitine. b. s/p Tikosyn initiation 03/2015.  [name] Pericarditis     due to perforation  . Inguinal hernia recurrent bilateral   . GERD (gastroesophageal reflux disease)   . Anemia 05/2014  . Cardiac arrest ([place]) 12/05/2013  . SVT (supraventricular tachycardia) ([place]) 12/05/2013  . Paroxysmal atrial flutter ([place])   . Left ventricular aneurysm      Surgical History:  Past Surgical  History  Procedure Laterality Date  . Icd  Feb 2012    Change out with LV lead placed with tunneling from the right  to the left side  . Endovascular stent insertion      for AAA  . Cardiac catheterization      x2  . Pacemaker insertion    .  Ventricular ablation surgery      s/p prior EPS and ablation at [facility] 9/12, [facility] 11/12, and most recent [facility] 08/[facility]/11  . Tonsillectomy    . V-tach ablation N/A 08/24/2011    Procedure: V-TACH ABLATION;  Surgeon: Thompson Grayer, MD;  Location[facility] C[facility] LAB;  Service: Cardiovascular;  Laterality: N/A;  . Inguinal hernia repair Bilateral 06/20/2014    Procedure: LAPAROSCOPIC BILATERAL INGUINAL HERNIA REPAIR;  Surgeon: Michael Boston, MD;  Location: [place];  Service: General;  Laterality: Bilateral;  . Insertion of mesh N/A 06/20/2014    Procedure: INSERTION OF MESH;  Surgeon: Michael Boston, MD;  Location: [place];  Service: General;  Laterality: N/A;  . Cardioversion N/A 07/05/2014    Procedure: CARDIOVERSION;  Surgeon: Larey Dresser, MD;  Location: [place];  Service: Cardiovascular;  Laterality: N/A;  . Cardioversion N/A 11/22/2014    Procedure: CARDIOVERSION;  Surgeon: Thayer Headings, MD;  Location: [place];  Service: Cardiovascular;  Laterality: N/A;  . Cardioversion N/A 02/18/2015    Procedure: CARDIOVERSION;  Surgeon: Thayer Headings, MD;  Location: [place];  Service: Cardiovascular;  Laterality: N/A;  . Cardioversion N/A 03/09/2015    Procedure: CARDIOVERSION;  Surgeon: Deboraha Sprang, MD;  Location: [place];  Service: Cardiovascular;  Laterality: N/A;     Home Meds: Prior to Admission medications   Medication Sig Start Date End Date Taking? Authorizing Provider  acetaminophen (TYLENOL) 500 MG tablet Take 250 mg by mouth 2 (two) times daily as needed for moderate pain.     Historical Provider, MD  ALPRAZolam [name]) 0.25 MG tablet Take 0.25 mg by mouth every 4 (four) hours.    Historical Provider, MD  Ascorbic Acid (VITAMIN C) 500 MG tablet Take 500 mg by mouth daily at 2 PM daily at 2 PM.     Historical Provider, MD  carvedilol (COREG) 12.5 MG tablet Take 1 tablet (12.5 mg total) by mouth 2 (two) times daily. 10/17/14   Darlin Coco, MD  docusate sodium (COLACE) 100 MG  capsule Take 100 mg by mouth daily at 2 PM daily at 2 PM.    Historical Provider, MD  dofetilide (TIKOSYN) 250 MCG capsule Take 1 capsule (250 mcg total) by mouth 2 (two) times daily. 03/10/15   Amber Sena Slate, NP  furosemide (LASIX) 20 MG tablet Take 40 mg by mouth 2 (two) times daily. Takes at 6AM.    Historical Provider, MD  hydroxypropyl methylcellulose / hypromellose (ISOPTO TEARS / GONIOVISC) 2.5 % ophthalmic solution Place 1 drop into both eyes as needed for dry eyes.    Historical Provider, MD  levothyroxine (SYNTHROID, LEVOTHROID) 88 MCG tablet Take 1 1/2 tablet (132 mcg) on Friday, take 1 tablet (88 mcg) on all other days of the week 11/29/14   Darlin Coco, MD  loratadine (CLARITIN) 10 MG tablet Take 10 mg by mouth daily at 2 PM daily at 2 PM.     Historical Provider, MD  losartan (COZAAR) 50 MG tablet Take 25 mg by mouth daily.    Historical Provider, MD  meclizine (ANTIVERT) 25 MG tablet TAKE 1 TABLET (25 MG TOTAL)  BY MOUTH 3 (THREE) TIMES DAILY AS NEEDED. Patient taking differently: TAKE 1 TABLET (25 MG TOTAL) BY MOUTH 3 (THREE) TIMES DAILY. 01/21/15   Darlin Coco, MD  mexiletine (MEXITIL) 150 MG capsule Take 150 mg by mouth 3 (three) times daily.    Historical Provider, MD  Multiple Vitamin (MULTIVITAMIN WITH MINERALS) TABS tablet Take 1 tablet by mouth daily at 2 PM daily at 2 PM.     Historical Provider, MD  Omega-3 Fatty Acids (FISH OIL) 1000 MG CAPS Take 1,000 mg by mouth 2 (two) times daily. 6am and 6pm    Historical Provider, MD  potassium chloride SA (K-DUR,KLOR-CON) 20 MEQ tablet Take 1 tablet (20 mEq total) by mouth daily. 03/10/15   Amber Sena Slate, NP  ranitidine (ZANTAC) 150 MG tablet Take 150 mg by mouth at bedtime.     Historical Provider, MD  rosuvastatin (CRESTOR) 5 MG tablet Take 5 mg by mouth. Take 1 tablet by mouth two times a week (Tuesdays Saturdays)    Historical Provider, MD  warfarin (COUMADIN) 2.5 MG tablet TAKE AS DIRECTED BY COUMADIN CLINIC Patient  taking differently: Take 2.5mg  (1 tablet) on Tues and Sat and 3.75mg  (1 1/2 tablets) all other days. 01/21/15   Darlin Coco, MD    Inpatient Medications:  . etomidate  20 mg/kg Intravenous Once   . epinephrine 10 mcg/min (03/12/2015 1907)  . fentaNYL infusion INTRAVENOUS      Allergies:  Allergies  Allergen Reactions  . Iohexol Hives and Itching     Code: HIVES, Desc: hives w/ itching during cardiac cath '99, mult caths since w/ ? premeds, DR.G.HAYES REQUESTS 13 HR PRE MED//A.C.pt okay w/13 hr prep/mms, Onset Date: [id number]   . Prednisone Hives    Social History   Social History  . Marital Status: Married    Spouse Name: N/A  . Number of Children: N/A  . Years of Education: N/A   Occupational History  . retired      used to Armed forces operational officer   Social History Main Topics  . Smoking status: Former Smoker    Quit date: 01/07/1994  . Smokeless tobacco: Never Used  . Alcohol Use: 0.0 oz/week    0 Standard drinks or equivalent per week     Comment: very rare  . Drug Use: No  . Sexual Activity: Not Currently   Other Topics Concern  . Not on file   Social History Narrative     Family History  Problem Relation Age of Onset  . Stroke Mother   . Heart attack Mother   . Heart disease Mother   . Hyperlipidemia Mother   . Hypertension Mother   . Heart attack Father   . Heart disease Father   . Hyperlipidemia Father   . Hypertension Father   . Heart disease Sister   . Hyperlipidemia Sister   . Hypertension Brother      Review of Systems:unable to obtain due to intubation/mental status  Labs:  Lab Results  Component Value Date   WBC 7.7 03/28/2015   HGB 13.9 03/29/2015   HCT 43.7 03/08/2015   MCV 99.8 03/12/2015   PLT 184 03/17/2015    Recent Labs Lab 03/27/2015 1910  NA 137  K 4.3  CL 101  CO2 15*  BUN 16  CREATININE 1.54*  CALCIUM 8.7*  GLUCOSE 211*   Lab Results  Component Value Date   CHOL 142 02/06/2015   HDL 37.50* 02/06/2015   LDLCALC 77  02/06/2015   TRIG  134.0 02/06/2015   Lab Results  Component Value Date   DDIMER 2.23* 01/18/2014    Radiology/Studies:  Dg Chest Portable 1 View  03/08/2015  CLINICAL DATA:  Endotracheal tube placement. EXAM: PORTABLE CHEST 1 VIEW COMPARISON:  01/19/2015 FINDINGS: Endotracheal tube is 3.4 cm from the carina. Enteric tube in place, tip below the diaphragm not included in the field of view. Multi lead left-sided pacemaker remains in place. Bilateral perihilar alveolar opacities consistent with pulmonary edema. There is cardiomegaly. Questionable blunting of both costophrenic angles. No pneumothorax. No acute osseous abnormalities are seen. IMPRESSION: 1. Endotracheal tube 3.4 cm from the carina.  Enteric tube in place. 2. Cardiomegaly with bilateral perihilar alveolar opacities, most consistent with pulmonary edema. Electronically Signed   By: Jeb Levering M.D.   On: 03/31/2015 19:45    Wt Readings from Last 3 Encounters:  03/17/15 167 lb (75.751 kg)  03/05/15 168 lb 12.8 oz (76.567 kg)  02/18/15 166 lb (75.297 kg)   EKG: AV pacing 82bpm, QRS slightly wider at 119ms but QT appears similar to prior tracing yesterday  Physical Exam: Blood pressure 103/59, pulse 60, resp. rate 18, SpO2 92 %. General: Well developed, well nourished WM in no acute distress. Head: Normocephalic, atraumatic, sclera non-icteric, no xanthomas, nares are without discharge.  Neck: JVD not elevated. Lungs: Clear bilaterally to auscultation without wheezes, rales, or rhonchi. Breathing is unlabored. Heart: RRR with S1 S2. No murmurs, rubs, or gallops appreciated. Abdomen: Soft, non-tender, non-distended with normoactive bowel sounds. No hepatomegaly. No rebound/guarding. No obvious abdominal masses. Msk:  Strength and tone appear normal for age. Extremities: No clubbing or cyanosis. No edema.  Distal pedal pulses are 2+ on the right and 1+ on the left. Neuro: Intubated, sedated with periodic posturing.     Assessment and Plan:   1. Cardiac arrest 2. Paroxysmal atrial arrhythmias s/p recent Tikosyn initiation 3. Lactic acidosis 4. Acute kidney injury 5. H/o ventricular arrhythmias 6. H/o remote CAD 7. ICM/chronic systolic CHF s/p ICD 8. HTN  See below for discussion.  SignedMelina Copa PA-C , 8:03 PM Pager: [phone]  The patient was seen and examined with Melina Copa PA-C, and I agree with the history, physical, and assessment as documented above. Unfortunately, at this time, there is very little to offer this gravely ill man from a cardiovascular standpoint. Given his probable anoxic brain injury with witnessed episodic posturing during my physical exam, prognosis is very poor. He remains on an epinephrine drip. Tikosyn will be held. Hypothermia protocol will be initiated by critical care.  Will have device interrogated to determine arrhythmic progression. ER physician and staff trying to contact patient's wife. Will ask EP to see on 10/19. If arrhythmia issues arise overnight, please call fellow on call.  EP physician on call is Dr. Rayann Heman.

## 2015-03-18 NOTE — Code Documentation (Addendum)
Pt here with [facility]- witnessed arrest by Fire. Agonal respirations on EMS arrival. Pt has received 7 rounds of epi, 450mg  of amiodarone.2 shocks administered, 300 cc cold saline.

## 2015-03-21 ENCOUNTER — Telehealth: Payer: Self-pay

## 2015-03-21 NOTE — Telephone Encounter (Signed)
On 28-Mar-2015 I received a death certificate from [facility]. The death certificate is for cremation. The patient is a patient of Doctor Maneem. The death certificate will be taken to the [facility] ([id number]) this am for signature. On March 28, 2015 I received the death certificate back from Doctor Maneem. I got the death certificate ready for pickup and called the funeral home to let them know the death certificate is ready for pickup. I also faxed them a copy per their request.

## 2015-03-27 ENCOUNTER — Encounter: Payer: Self-pay | Admitting: Internal Medicine

## 2015-04-01 NOTE — Discharge Summary (Signed)
Name:Greg I Lamp OFB:[id number] DOB:[birth year]   ADMISSION DATE: 04-14-2015 DATE OF DEATH: Apr 14, 2015  74 Y/O with extensive cardiac history of persistent Afib, A flutter, CAD, LV aneurysm, VT storm s/p ablation, heart failue, HTN anemia, AAA presents after cardiac arrest with PEA followed by VT. Time to ROSC was 30 mins. He is on pressor support and has myoclonic jerks suggestive of anoxic injury. As per the wife his code status was DNR. She has decided to make him comfort care only with withdrawal and terminal extubation.   Patient was transported to ICU and terminally extubated. Epi and norepi drip were discontinued. He passed away at 2325.  Marshell Garfinkel MD [facility] Pager [phone] If no answer or after 3pm call: [phone] 03/21/2015, 3:03 PM

## 2015-04-01 NOTE — ED Provider Notes (Signed)
CSN: [id number]     Arrival date & time 03/13/2015  1850 History   First MD Initiated Contact with Patient 03/05/2015 1851     Chief Complaint  Patient presents with  . Cardiac Arrest    Patient is a 74 y.o. male presenting with general illness.  Illness Location:  Cardiac arrest. Severity:  Severe Onset quality:  Sudden Duration: Minutes. Chronicity:  New   Past Medical History  Diagnosis Date  . CAD (coronary artery disease) 1999    a. Remote anterolater and apical MI. b. cath 2011 patent stents RCA/LCx  LAD w/o obstruction  . Cardiomyopathy, ischemic     a. EF 25-30% by last echo 03/2015.  [name] Chronic systolic congestive heart failure ([place])   . 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 Abdominal aortic aneurysm ([place]) 2009    Repaired with endovascular stent  . PVD (peripheral vascular disease) ([place])     Has occluded innominate vein  . Myocardial infarction ([place])   . Hypertension   . Ventricular tachycardia ([place])     VT storm s/p ablation 2012 with Dr Rayann Heman, repeat ablation at [facility] complicated by pericardial perforation and pericarditis, repeat ablation 2013 by Dr Rayann Heman  . Left bundle branch block   . Arthritis   . Gynecomastia     2/2 spironolactone  . Persistent atrial fibrillation ([place])     a. Failed amio due to thyrotoxicosis, previously on sotalol and mexilitine. b. s/p Tikosyn initiation 03/2015.  [name] Pericarditis     due to perforation  . Inguinal hernia recurrent bilateral   . GERD (gastroesophageal reflux disease)   . Anemia 05/2014  . Cardiac arrest ([place]) 12/05/2013  . SVT (supraventricular tachycardia) ([place]) 12/05/2013  . Paroxysmal atrial flutter ([place])   . Left ventricular aneurysm    Past Surgical History  Procedure Laterality Date  . Icd  Feb 2012    Change out with LV lead placed with tunneling from the right  to the left side  . Endovascular stent insertion      for AAA  . Cardiac catheterization      x2  . Pacemaker insertion    . Ventricular  ablation surgery      s/p prior EPS and ablation at [facility] 9/12, [facility] 11/12, and most recent at [facility] 08/23/11  . Tonsillectomy    . V-tach ablation N/A 08/24/2011    Procedure: V-TACH ABLATION;  Surgeon: Thompson Grayer, MD;  Location: [facility] CATH LAB;  Service: Cardiovascular;  Laterality: N/A;  . Inguinal hernia repair Bilateral 06/20/2014    Procedure: LAPAROSCOPIC BILATERAL INGUINAL HERNIA REPAIR;  Surgeon: Michael Boston, MD;  Location: [place];  Service: General;  Laterality: Bilateral;  . Insertion of mesh N/A 06/20/2014    Procedure: INSERTION OF MESH;  Surgeon: Michael Boston, MD;  Location: [place];  Service: General;  Laterality: N/A;  . Cardioversion N/A 07/05/2014    Procedure: CARDIOVERSION;  Surgeon: Larey Dresser, MD;  Location: [place];  Service: Cardiovascular;  Laterality: N/A;  . Cardioversion N/A 11/22/2014    Procedure: CARDIOVERSION;  Surgeon: Thayer Headings, MD;  Location: [place];  Service: Cardiovascular;  Laterality: N/A;  . Cardioversion N/A 02/18/2015    Procedure: CARDIOVERSION;  Surgeon: Thayer Headings, MD;  Location: [place];  Service: Cardiovascular;  Laterality: N/A;  . Cardioversion N/A 03/09/2015    Procedure: CARDIOVERSION;  Surgeon: Deboraha Sprang, MD;  Location: [place];  Service: Cardiovascular;  Laterality: N/A;  Family History  Problem Relation Age of Onset  . Stroke Mother   . Heart attack Mother   . Heart disease Mother   . Hyperlipidemia Mother   . Hypertension Mother   . Heart attack Father   . Heart disease Father   . Hyperlipidemia Father   . Hypertension Father   . Heart disease Sister   . Hyperlipidemia Sister   . Hypertension Brother    Social History  Substance Use Topics  . Smoking status: Former Smoker    Quit date: 01/07/1994  . Smokeless tobacco: Never Used  . Alcohol Use: 0.0 oz/week    0 Standard drinks or equivalent per week     Comment: very rare    Review of Systems  Unable to perform ROS: Patient  unresponsive      Allergies  Iohexol and Prednisone  Home Medications   Prior to Admission medications   Medication Sig Start Date End Date Taking? Authorizing Provider  acetaminophen (TYLENOL) 500 MG tablet Take 250 mg by mouth 2 (two) times daily as needed for moderate pain.     Historical Provider, MD  ALPRAZolam [name]) 0.25 MG tablet Take 0.25 mg by mouth every 4 (four) hours.    Historical Provider, MD  Ascorbic Acid (VITAMIN C) 500 MG tablet Take 500 mg by mouth daily at 2 PM daily at 2 PM.     Historical Provider, MD  carvedilol (COREG) 12.5 MG tablet Take 1 tablet (12.5 mg total) by mouth 2 (two) times daily. 10/17/14   Darlin Coco, MD  docusate sodium (COLACE) 100 MG capsule Take 100 mg by mouth daily at 2 PM daily at 2 PM.    Historical Provider, MD  dofetilide (TIKOSYN) 250 MCG capsule Take 1 capsule (250 mcg total) by mouth 2 (two) times daily. 03/10/15   Amber Sena Slate, NP  furosemide (LASIX) 20 MG tablet Take 40 mg by mouth 2 (two) times daily. Takes at 6AM.    Historical Provider, MD  hydroxypropyl methylcellulose / hypromellose (ISOPTO TEARS / GONIOVISC) 2.5 % ophthalmic solution Place 1 drop into both eyes as needed for dry eyes.    Historical Provider, MD  levothyroxine (SYNTHROID, LEVOTHROID) 88 MCG tablet Take 1 1/2 tablet (132 mcg) on Friday, take 1 tablet (88 mcg) on all other days of the week 11/29/14   Darlin Coco, MD  loratadine (CLARITIN) 10 MG tablet Take 10 mg by mouth daily at 2 PM daily at 2 PM.     Historical Provider, MD  losartan (COZAAR) 50 MG tablet Take 25 mg by mouth daily.    Historical Provider, MD  meclizine (ANTIVERT) 25 MG tablet TAKE 1 TABLET (25 MG TOTAL) BY MOUTH 3 (THREE) TIMES DAILY AS NEEDED. Patient taking differently: TAKE 1 TABLET (25 MG TOTAL) BY MOUTH 3 (THREE) TIMES DAILY. 01/21/15   Darlin Coco, MD  mexiletine (MEXITIL) 150 MG capsule Take 150 mg by mouth 3 (three) times daily.    Historical Provider, MD  Multiple Vitamin  (MULTIVITAMIN WITH MINERALS) TABS tablet Take 1 tablet by mouth daily at 2 PM daily at 2 PM.     Historical Provider, MD  Omega-3 Fatty Acids (FISH OIL) 1000 MG CAPS Take 1,000 mg by mouth 2 (two) times daily. 6am and 6pm    Historical Provider, MD  potassium chloride SA (K-DUR,KLOR-CON) 20 MEQ tablet Take 1 tablet (20 mEq total) by mouth daily. 03/10/15   Amber Sena Slate, NP  ranitidine (ZANTAC) 150 MG tablet Take 150 mg by  mouth at bedtime.     Historical Provider, MD  rosuvastatin (CRESTOR) 5 MG tablet Take 5 mg by mouth. Take 1 tablet by mouth two times a week (Tuesdays Saturdays)    Historical Provider, MD  warfarin (COUMADIN) 2.5 MG tablet TAKE AS DIRECTED BY COUMADIN CLINIC Patient taking differently: Take 2.5mg  (1 tablet) on Tues and Sat and 3.75mg  (1 1/2 tablets) all other days. 01/21/15   Darlin Coco, MD   BP 147/60 mmHg  Pulse 59  Resp 10  SpO2 93% Physical Exam  Constitutional:  Unresponsive, mottled   HENT:  Head: Normocephalic.  Eyes:  Minimally reactive pupils  Neck: No tracheal deviation present.  Cardiovascular:  No palpable pulse  Pulmonary/Chest:  No inspiratory effort  Abdominal: Soft.  Musculoskeletal: He exhibits no edema.  Neurological:  Unresponsive   Skin:  Cool/mottled   Psychiatric: He has a normal mood and affect. His behavior is normal. Thought content normal.  Nursing note and vitals reviewed.   ED Course  ARTERIAL LINE Date/Time: 03-29-2015 2:26 AM Performed by: Roberto Scales Authorized by: Roberto Scales Consent: The procedure was performed in an emergent situation. Patient identity confirmed: arm band Preparation: Patient was prepped and draped in the usual sterile fashion. Indications: hemodynamic monitoring Location: left radial Needle gauge: 18 Seldinger technique: Seldinger technique used Number of attempts: 4 Post-procedure: unable to successfully place A-line.  .Intubation Date/Time: Mar 29, 2015 2:26 AM Performed by:  Roberto Scales Authorized by: Roberto Scales Consent: The procedure was performed in an emergent situation. Patient identity confirmed: anonymous protocol, patient vented/unresponsive Indications: respiratory failure and  airway protection Intubation method: direct Patient status: unconscious Laryngoscope size: Miller 3 Tube size: 7.5 mm Tube type: cuffed Number of attempts: 1 Cords visualized: yes Post-procedure assessment: chest rise,  ETCO2 monitor and CO2 detector Breath sounds: equal and absent over the epigastrium Cuff inflated: yes ETT to lip: 22 cm Tube secured with: ETT holder Chest x-ray interpreted by other physician. Patient tolerance: Patient tolerated the procedure well with no immediate complications   (including critical care time) Labs Review Labs Reviewed  BASIC METABOLIC PANEL - Abnormal; Notable for the following:    CO2 15 (*)    Glucose, Bld 211 (*)    Creatinine, Ser 1.54 (*)    Calcium 8.7 (*)    GFR calc non Af Amer 43 (*)    GFR calc Af Amer 50 (*)    Anion gap 21 (*)    All other components within normal limits  I-STAT CG4 LACTIC ACID, ED - Abnormal; Notable for the following:    Lactic Acid, Venous 12.25 (*)    All other components within normal limits  I-STAT ARTERIAL BLOOD GAS, ED - Abnormal; Notable for the following:    pH, Arterial 6.856 (*)    pCO2 arterial 72.7 (*)    pO2, Arterial 76.0 (*)    Bicarbonate 12.9 (*)    Acid-base deficit 21.0 (*)    All other components within normal limits  I-STAT ARTERIAL BLOOD GAS, ED - Abnormal; Notable for the following:    pH, Arterial 7.151 (*)    pCO2 arterial 55.5 (*)    pO2, Arterial 65.0 (*)    Bicarbonate 19.3 (*)    Acid-base deficit 10.0 (*)    All other components within normal limits  CBC  I-STAT TROPOININ, ED    Imaging Review Dg Chest Portable 1 View  03/24/2015  CLINICAL DATA:  Endotracheal tube placement. EXAM: PORTABLE CHEST 1 VIEW COMPARISON:  01/19/2015 FINDINGS:  Endotracheal tube is 3.4 cm from the carina. Enteric tube in place, tip below the diaphragm not included in the field of view. Multi lead left-sided pacemaker remains in place. Bilateral perihilar alveolar opacities consistent with pulmonary edema. There is cardiomegaly. Questionable blunting of both costophrenic angles. No pneumothorax. No acute osseous abnormalities are seen. IMPRESSION: 1. Endotracheal tube 3.4 cm from the carina.  Enteric tube in place. 2. Cardiomegaly with bilateral perihilar alveolar opacities, most consistent with pulmonary edema. Electronically Signed   By: Jeb Levering M.D.   On: 03/28/2015 19:45   I have personally reviewed and evaluated these images and lab results as part of my medical decision-making.   EKG Interpretation   Date/Time:  Tuesday March 18 2015 19:02:05 EDT Ventricular Rate:  82 PR Interval:  222 QRS Duration: 174 QT Interval:  481 QTC Calculation: 562 R Axis:   -82 Text Interpretation:  AV dual-paced rhythm When compared with ECG of  04/17/2015, QRS has widened Confirmed by [facility]  MD, [name] ([id number]) on  03/13/2015 7:27:51 PM      MDM   Patient presents emergency department today in cardiac arrest. Initially EMS was called to respiratory distress. At that time he reportedly went to the PEA arrest with prior department on scene. Patient was then in V. fib arrest shocked twice by EMS and subsequently went back and PEA. After seven rounds of epi, patient was brought here with IO access. 2 additional epinephrines were given along with sodium bicarbonate and patient regained pulses. Patient placed on epi drip. ET tube placed. Patient admitted to ICU with cardiology consult for further management.   Final diagnoses:  Cardiac arrest [facility])    Roberto Scales, MD 82/50/53 [id number]  Delora Fuel, MD 34/19/37 9024

## 2015-04-01 NOTE — Progress Notes (Signed)
Patient was terminally extubated at 2316. Epi drip and levophed drip also discontinued per MD order. Pt remained on Fentanyl drip at 400 mcg and Versed drip at 10. Wife at bedside. Emotional support given to wife.   At 2325 patient expired. No heart sound auscultated and respirations noted as verified with another  Nurse Melene Plan, RN.  Fentanyl drip 200 cc and versed drip 40 cc  wasted in sink.

## 2015-04-01 DEATH — deceased

## 2015-04-16 ENCOUNTER — Ambulatory Visit: Payer: Medicare Other | Admitting: Cardiology

## 2015-05-08 ENCOUNTER — Encounter: Payer: Medicare Other | Admitting: Internal Medicine

## 2015-05-30 ENCOUNTER — Encounter: Payer: Medicare Other | Admitting: Internal Medicine

## 2016-01-26 ENCOUNTER — Other Ambulatory Visit (HOSPITAL_COMMUNITY): Payer: Medicare Other

## 2016-01-26 ENCOUNTER — Ambulatory Visit: Payer: Medicare Other | Admitting: Family

## 2016-04-19 IMAGING — CR DG ABD PORTABLE 2V
2 series · 2 of 2 positions shown · non-contrast
Comparison: CT of the abdomen and pelvis performed 03/13/2013, and
abdominal radiograph performed 11/24/2007

CLINICAL DATA: Acute onset of vomiting. Assess for ileus. Initial
encounter.

EXAM:
PORTABLE ABDOMEN - 2 VIEW

[ap lld]
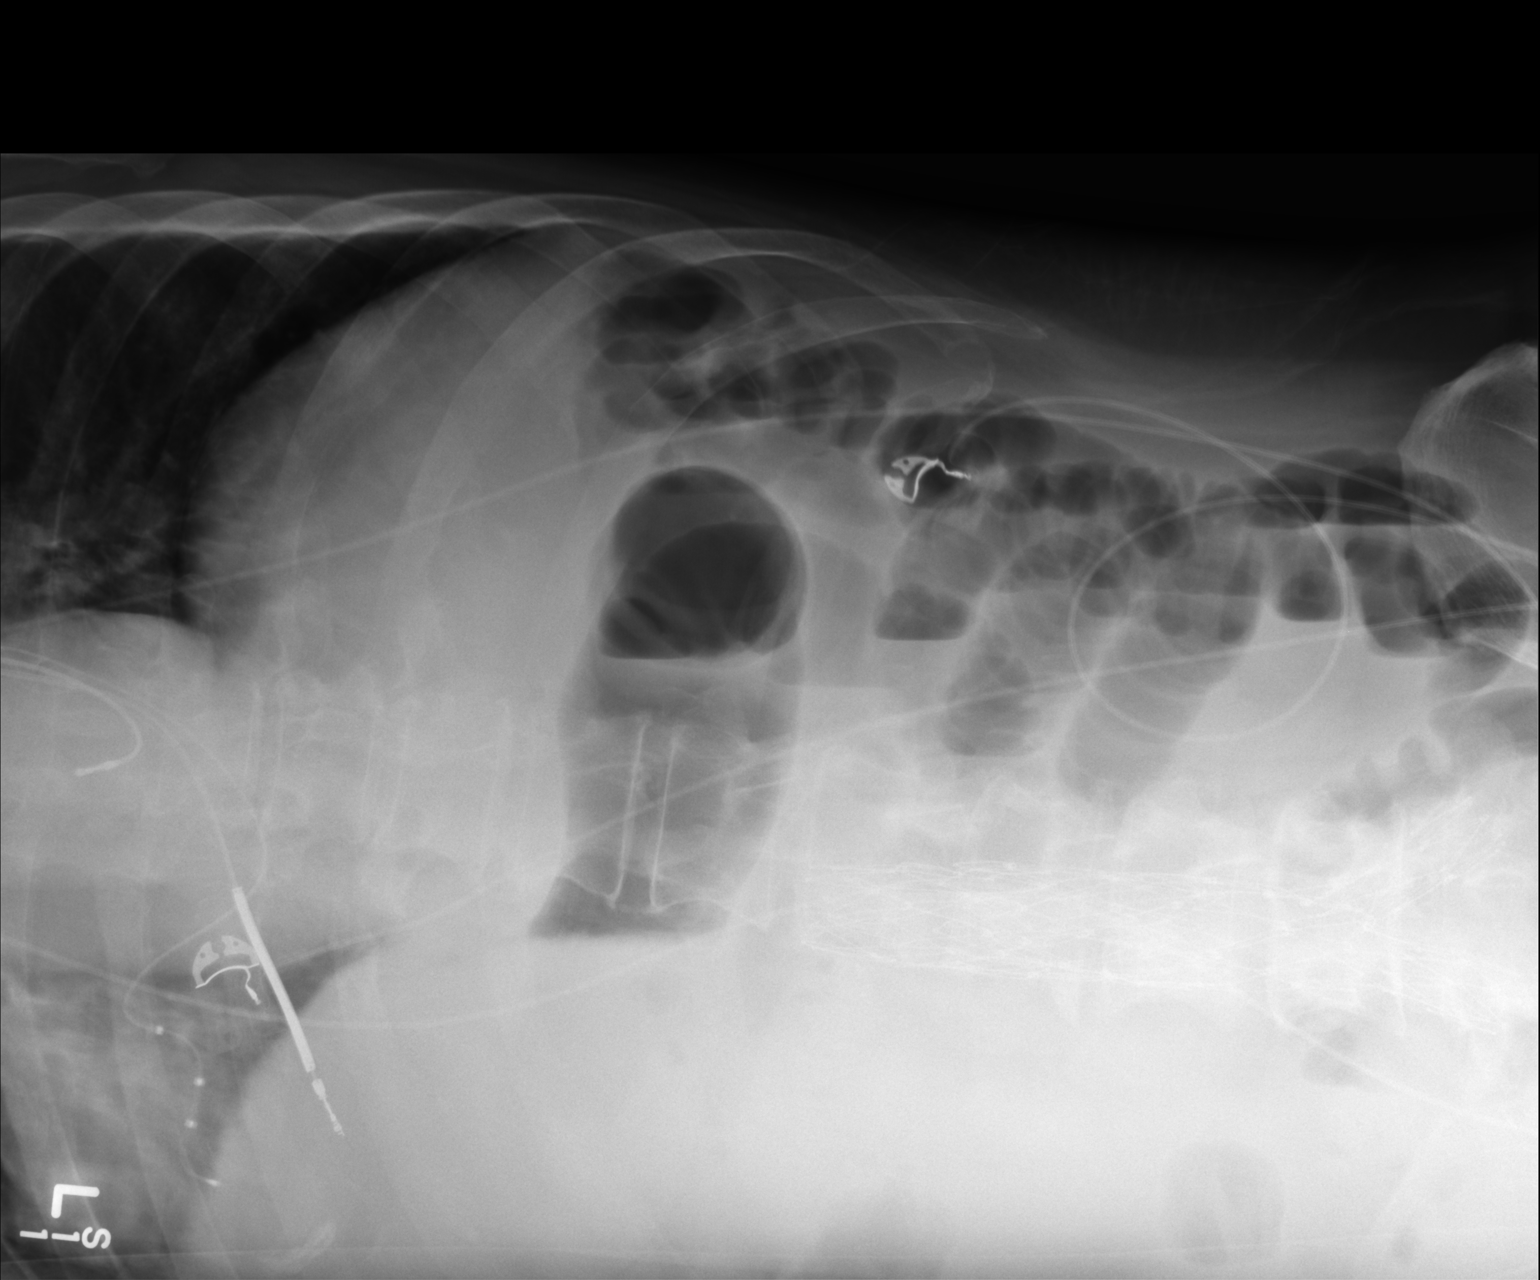

[AP]
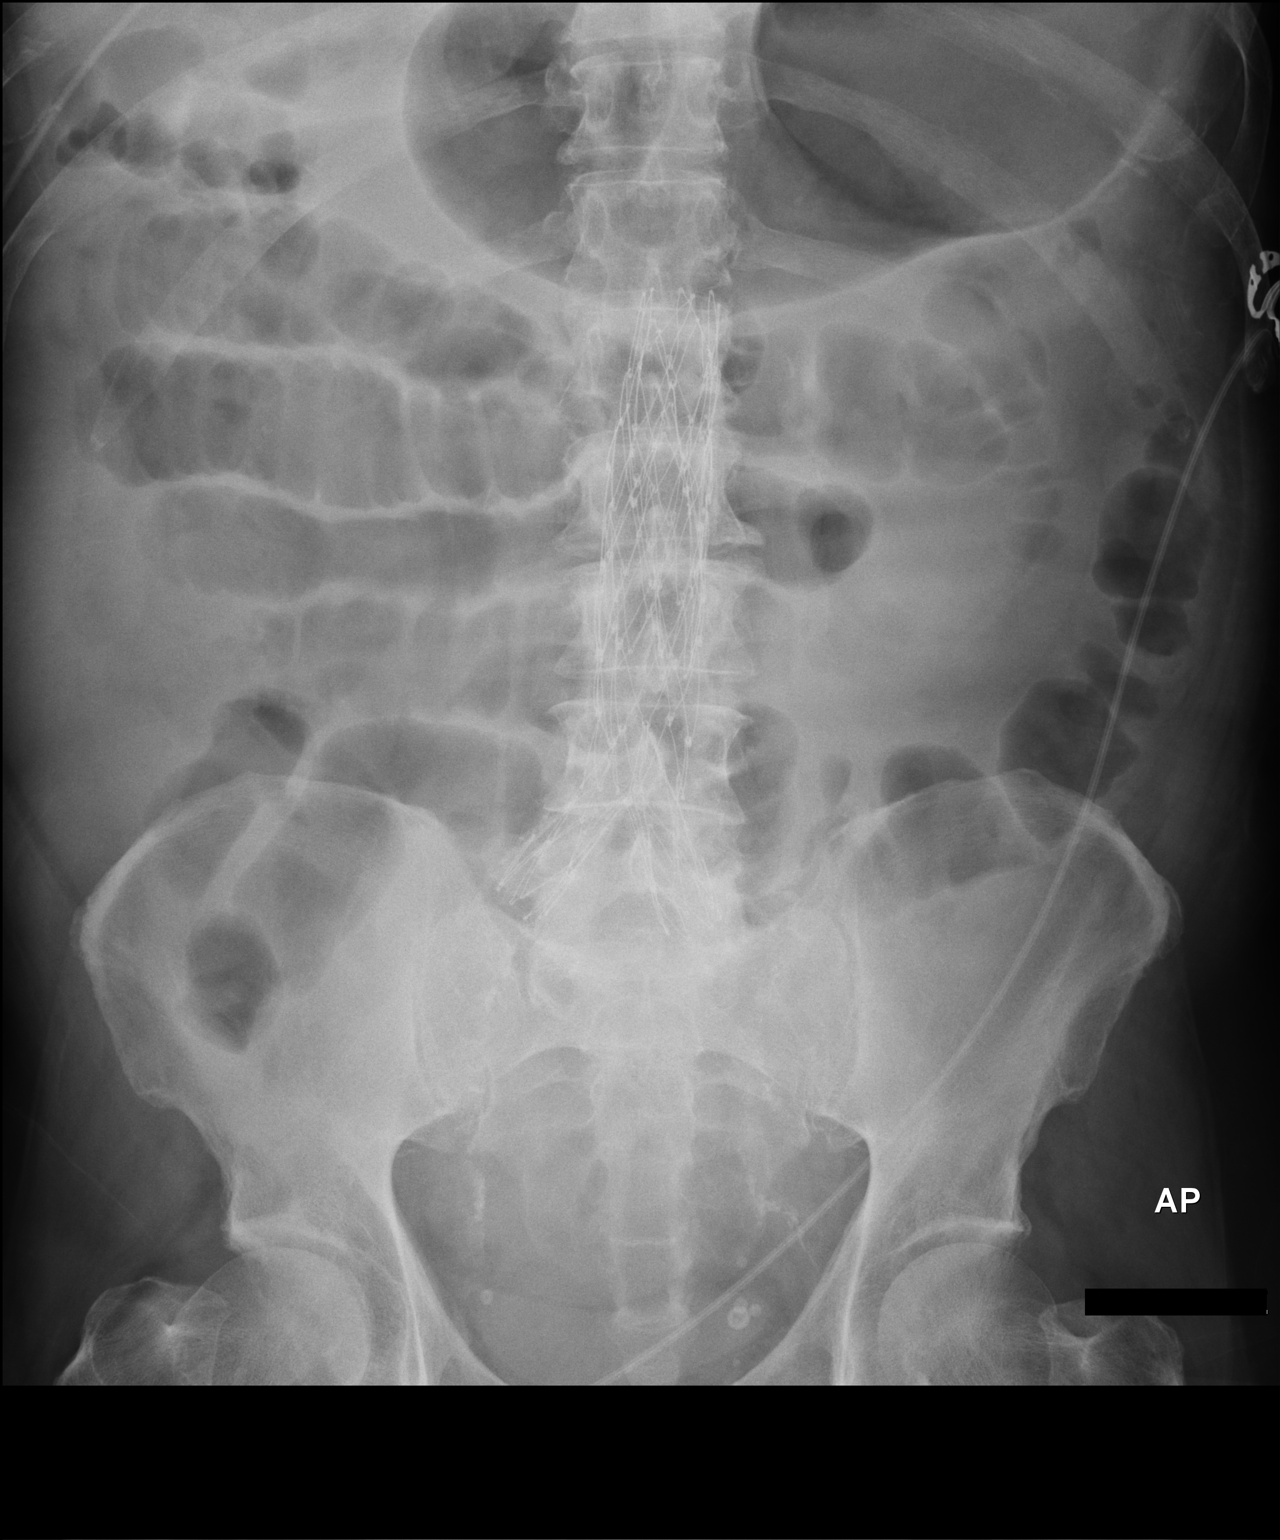

[2 of 2 positions shown; findings below may reference images not displayed]

FINDINGS: There is mild distention of the stomach with air. Small bowel loops
are dilated to 4.0 cm in maximal diameter, though air is still seen
within the transverse colon. The appearance is suggestive of partial
small bowel obstruction. There is no definite evidence for diffuse
ileus, as the colon remains normal in caliber. No free
intra-abdominal air is identified on the provided decubitus view.

No acute osseous abnormalities are seen. An aortoiliac stent graft
is noted.
IMPRESSION: Mild distention of the stomach with air. Small bowel loops are
dilated to 4.0 cm in maximal diameter, though air is still seen
within the transverse colon. The appearance is suggestive of partial
small bowel obstruction. No free intra-abdominal air seen.

## 2016-04-19 IMAGING — CR DG CHEST 1V PORT
1 series · 1 of 1 positions shown · non-contrast
Comparison: the previous day's study

CLINICAL DATA: Shortness of breath, vomiting

EXAM:
PORTABLE CHEST - 1 VIEW

[AP]
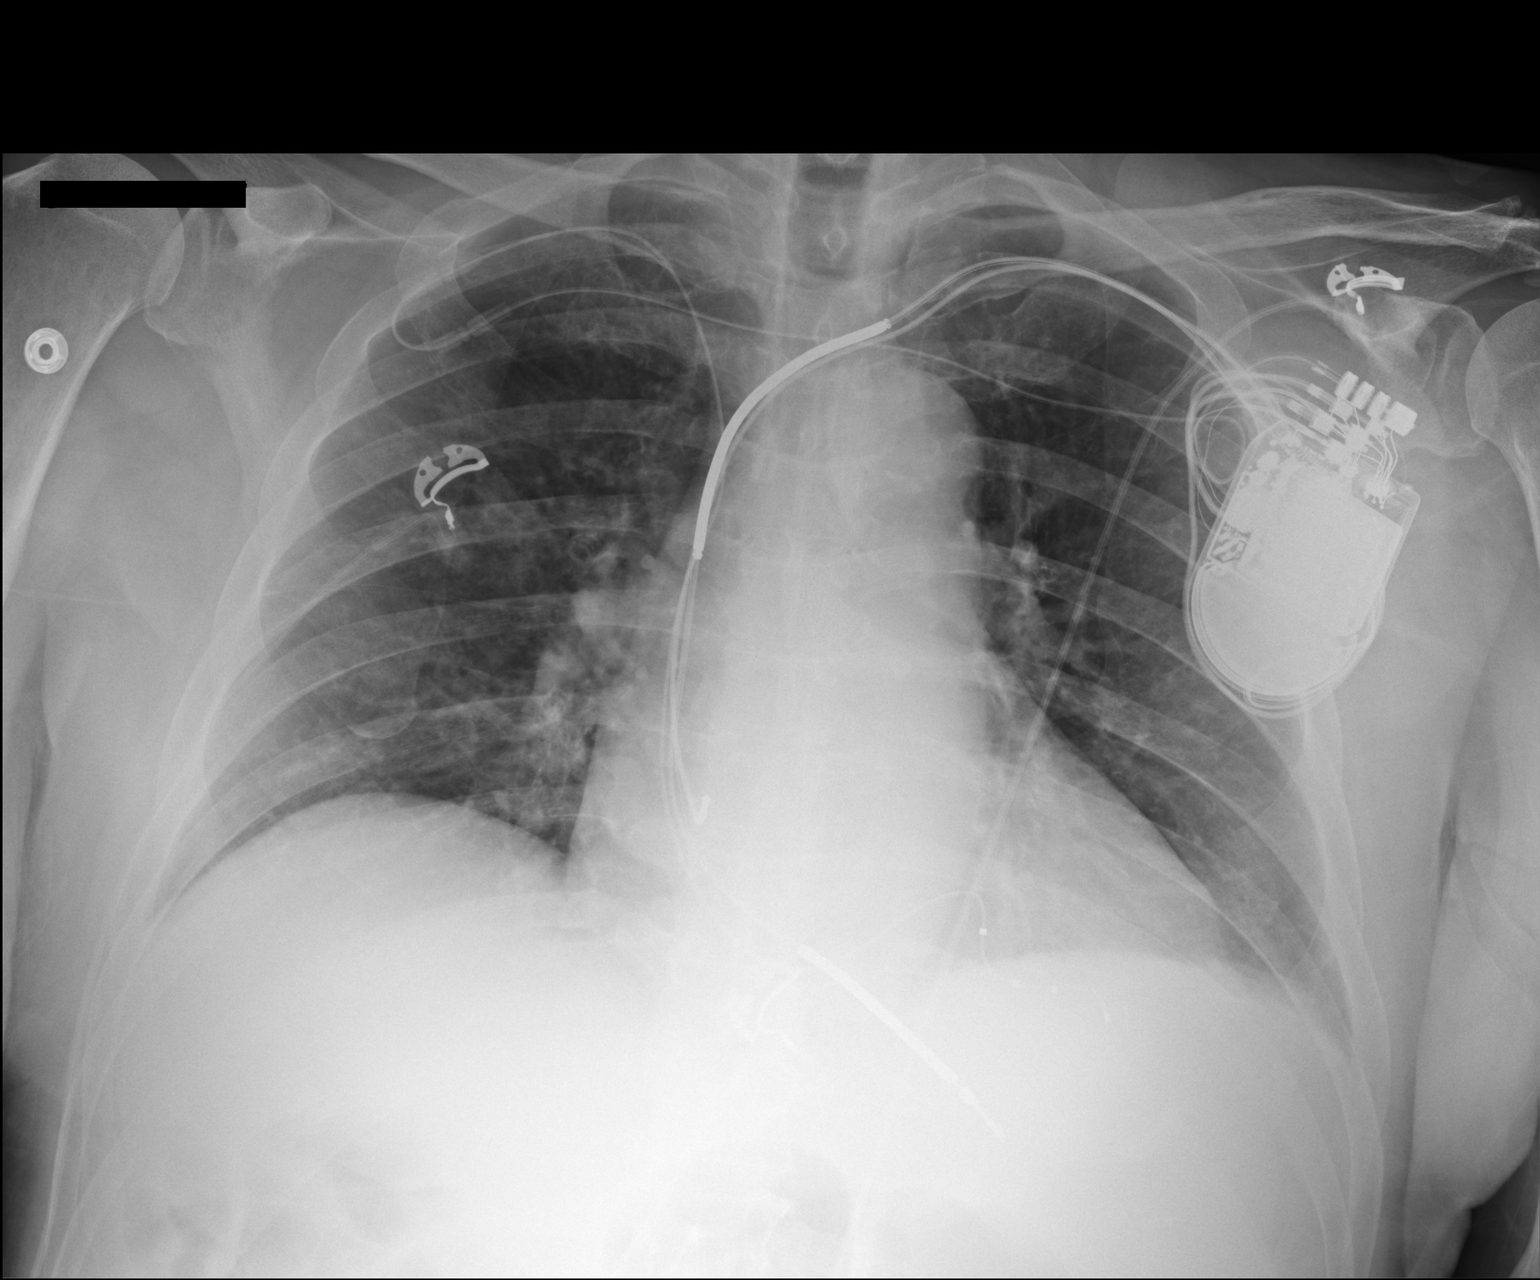

[1 of 1 positions shown; findings below may reference images not displayed]

FINDINGS: Stable left subclavian AICD with an additional right subclavian
lead. Low lung volumes. No focal infiltrate or overt edema. Heart
size upper limits normal. No pneumothorax.
No effusion.
Visualized skeletal structures are unremarkable.
IMPRESSION: No acute cardiopulmonary disease.
